# Patient Record
Sex: Female | Born: 1940 | Race: White | Smoking: Never smoker
Health system: Southern US, Community
[De-identification: ages and names within clinical notes are randomized; demographics above are authoritative.]

## PROBLEM LIST (undated history)

## (undated) DIAGNOSIS — J189 Pneumonia, unspecified organism: Secondary | ICD-10-CM

## (undated) DIAGNOSIS — F329 Major depressive disorder, single episode, unspecified: Secondary | ICD-10-CM

## (undated) DIAGNOSIS — F32A Depression, unspecified: Secondary | ICD-10-CM

## (undated) DIAGNOSIS — K5909 Other constipation: Secondary | ICD-10-CM

## (undated) DIAGNOSIS — G35 Multiple sclerosis: Secondary | ICD-10-CM

## (undated) DIAGNOSIS — K5790 Diverticulosis of intestine, part unspecified, without perforation or abscess without bleeding: Secondary | ICD-10-CM

## (undated) DIAGNOSIS — N390 Urinary tract infection, site not specified: Secondary | ICD-10-CM

## (undated) DIAGNOSIS — K219 Gastro-esophageal reflux disease without esophagitis: Secondary | ICD-10-CM

## (undated) HISTORY — PX: CHOLECYSTECTOMY: SHX55

---

## 1999-05-29 ENCOUNTER — Ambulatory Visit (HOSPITAL_COMMUNITY): Admission: RE | Admit: 1999-05-29 | Discharge: 1999-05-29 | Payer: Self-pay | Admitting: Endocrinology

## 1999-06-23 ENCOUNTER — Encounter: Payer: Self-pay | Admitting: Internal Medicine

## 1999-06-23 ENCOUNTER — Ambulatory Visit (HOSPITAL_COMMUNITY): Admission: RE | Admit: 1999-06-23 | Discharge: 1999-06-23 | Payer: Self-pay | Admitting: Internal Medicine

## 2001-02-05 ENCOUNTER — Ambulatory Visit (HOSPITAL_COMMUNITY): Admission: RE | Admit: 2001-02-05 | Discharge: 2001-02-05 | Payer: Self-pay | Admitting: Internal Medicine

## 2001-02-05 ENCOUNTER — Encounter: Payer: Self-pay | Admitting: Internal Medicine

## 2002-03-16 ENCOUNTER — Encounter: Payer: Self-pay | Admitting: Internal Medicine

## 2002-03-26 ENCOUNTER — Encounter: Payer: Self-pay | Admitting: Internal Medicine

## 2002-03-26 ENCOUNTER — Ambulatory Visit (HOSPITAL_COMMUNITY): Admission: RE | Admit: 2002-03-26 | Discharge: 2002-03-26 | Payer: Self-pay | Admitting: Internal Medicine

## 2002-05-19 ENCOUNTER — Ambulatory Visit (HOSPITAL_COMMUNITY): Admission: RE | Admit: 2002-05-19 | Discharge: 2002-05-19 | Payer: Self-pay | Admitting: Internal Medicine

## 2002-05-19 ENCOUNTER — Encounter: Payer: Self-pay | Admitting: Internal Medicine

## 2003-04-29 ENCOUNTER — Inpatient Hospital Stay (HOSPITAL_COMMUNITY): Admission: EM | Admit: 2003-04-29 | Discharge: 2003-05-03 | Payer: Self-pay | Admitting: Emergency Medicine

## 2003-04-29 ENCOUNTER — Encounter: Payer: Self-pay | Admitting: Internal Medicine

## 2003-04-30 ENCOUNTER — Encounter: Payer: Self-pay | Admitting: Internal Medicine

## 2003-05-03 ENCOUNTER — Encounter (INDEPENDENT_AMBULATORY_CARE_PROVIDER_SITE_OTHER): Payer: Self-pay | Admitting: *Deleted

## 2003-06-16 ENCOUNTER — Ambulatory Visit (HOSPITAL_COMMUNITY): Admission: RE | Admit: 2003-06-16 | Discharge: 2003-06-16 | Payer: Self-pay | Admitting: Internal Medicine

## 2003-06-16 ENCOUNTER — Encounter: Payer: Self-pay | Admitting: Internal Medicine

## 2003-08-16 ENCOUNTER — Ambulatory Visit (HOSPITAL_COMMUNITY): Admission: RE | Admit: 2003-08-16 | Discharge: 2003-08-16 | Payer: Self-pay | Admitting: Internal Medicine

## 2003-08-16 ENCOUNTER — Encounter: Payer: Self-pay | Admitting: Internal Medicine

## 2004-01-03 ENCOUNTER — Emergency Department (HOSPITAL_COMMUNITY): Admission: EM | Admit: 2004-01-03 | Discharge: 2004-01-03 | Payer: Self-pay | Admitting: Emergency Medicine

## 2004-08-24 ENCOUNTER — Ambulatory Visit: Payer: Self-pay | Admitting: Internal Medicine

## 2004-08-29 ENCOUNTER — Ambulatory Visit: Payer: Self-pay | Admitting: Internal Medicine

## 2004-08-29 ENCOUNTER — Inpatient Hospital Stay (HOSPITAL_COMMUNITY): Admission: EM | Admit: 2004-08-29 | Discharge: 2004-09-01 | Payer: Self-pay | Admitting: Emergency Medicine

## 2004-09-01 ENCOUNTER — Ambulatory Visit: Payer: Self-pay | Admitting: *Deleted

## 2004-09-08 ENCOUNTER — Ambulatory Visit: Payer: Self-pay | Admitting: Internal Medicine

## 2004-09-19 ENCOUNTER — Ambulatory Visit: Payer: Self-pay | Admitting: Internal Medicine

## 2004-10-30 ENCOUNTER — Ambulatory Visit: Payer: Self-pay | Admitting: Internal Medicine

## 2005-01-04 ENCOUNTER — Ambulatory Visit: Payer: Self-pay | Admitting: Internal Medicine

## 2005-01-15 ENCOUNTER — Ambulatory Visit (HOSPITAL_COMMUNITY): Admission: RE | Admit: 2005-01-15 | Discharge: 2005-01-15 | Payer: Self-pay | Admitting: *Deleted

## 2005-03-09 ENCOUNTER — Ambulatory Visit: Payer: Self-pay | Admitting: Family Medicine

## 2005-04-13 ENCOUNTER — Emergency Department (HOSPITAL_COMMUNITY): Admission: EM | Admit: 2005-04-13 | Discharge: 2005-04-13 | Payer: Self-pay | Admitting: Emergency Medicine

## 2005-04-23 ENCOUNTER — Ambulatory Visit: Payer: Self-pay | Admitting: Internal Medicine

## 2005-07-17 ENCOUNTER — Ambulatory Visit: Payer: Self-pay | Admitting: Internal Medicine

## 2005-08-30 ENCOUNTER — Ambulatory Visit: Payer: Self-pay | Admitting: Internal Medicine

## 2005-09-10 ENCOUNTER — Ambulatory Visit: Payer: Self-pay | Admitting: Internal Medicine

## 2005-09-17 ENCOUNTER — Ambulatory Visit: Payer: Self-pay | Admitting: Internal Medicine

## 2005-10-09 ENCOUNTER — Ambulatory Visit: Payer: Self-pay | Admitting: Internal Medicine

## 2005-10-22 ENCOUNTER — Ambulatory Visit: Payer: Self-pay | Admitting: Internal Medicine

## 2005-11-27 ENCOUNTER — Ambulatory Visit: Payer: Self-pay | Admitting: Internal Medicine

## 2005-11-27 ENCOUNTER — Ambulatory Visit (HOSPITAL_COMMUNITY): Admission: RE | Admit: 2005-11-27 | Discharge: 2005-11-27 | Payer: Self-pay | Admitting: Internal Medicine

## 2006-02-11 ENCOUNTER — Ambulatory Visit: Payer: Self-pay | Admitting: Internal Medicine

## 2006-02-24 ENCOUNTER — Ambulatory Visit: Payer: Self-pay | Admitting: Gastroenterology

## 2006-02-24 ENCOUNTER — Ambulatory Visit: Payer: Self-pay | Admitting: Physical Medicine & Rehabilitation

## 2006-02-24 ENCOUNTER — Ambulatory Visit: Payer: Self-pay | Admitting: Infectious Diseases

## 2006-02-24 ENCOUNTER — Inpatient Hospital Stay (HOSPITAL_COMMUNITY): Admission: EM | Admit: 2006-02-24 | Discharge: 2006-03-01 | Payer: Self-pay | Admitting: Emergency Medicine

## 2006-03-14 ENCOUNTER — Ambulatory Visit: Payer: Self-pay | Admitting: Internal Medicine

## 2006-04-25 ENCOUNTER — Ambulatory Visit: Payer: Self-pay | Admitting: Internal Medicine

## 2006-07-08 ENCOUNTER — Ambulatory Visit: Payer: Self-pay | Admitting: Internal Medicine

## 2006-07-25 ENCOUNTER — Ambulatory Visit: Payer: Self-pay | Admitting: Family Medicine

## 2006-07-25 ENCOUNTER — Inpatient Hospital Stay (HOSPITAL_COMMUNITY): Admission: EM | Admit: 2006-07-25 | Discharge: 2006-07-28 | Payer: Self-pay | Admitting: Emergency Medicine

## 2006-07-28 ENCOUNTER — Encounter (INDEPENDENT_AMBULATORY_CARE_PROVIDER_SITE_OTHER): Payer: Self-pay | Admitting: *Deleted

## 2006-09-11 ENCOUNTER — Inpatient Hospital Stay (HOSPITAL_COMMUNITY): Admission: EM | Admit: 2006-09-11 | Discharge: 2006-09-16 | Payer: Self-pay | Admitting: *Deleted

## 2006-09-15 ENCOUNTER — Encounter (INDEPENDENT_AMBULATORY_CARE_PROVIDER_SITE_OTHER): Payer: Self-pay | Admitting: *Deleted

## 2006-10-04 ENCOUNTER — Ambulatory Visit (HOSPITAL_COMMUNITY): Admission: RE | Admit: 2006-10-04 | Discharge: 2006-10-04 | Payer: Self-pay | Admitting: Internal Medicine

## 2006-10-04 ENCOUNTER — Ambulatory Visit: Payer: Self-pay | Admitting: Internal Medicine

## 2007-01-07 ENCOUNTER — Inpatient Hospital Stay (HOSPITAL_COMMUNITY): Admission: EM | Admit: 2007-01-07 | Discharge: 2007-01-10 | Payer: Self-pay | Admitting: Emergency Medicine

## 2007-02-27 ENCOUNTER — Ambulatory Visit: Payer: Self-pay | Admitting: Internal Medicine

## 2007-03-24 ENCOUNTER — Ambulatory Visit (HOSPITAL_COMMUNITY): Admission: RE | Admit: 2007-03-24 | Discharge: 2007-03-24 | Payer: Self-pay | Admitting: Internal Medicine

## 2007-03-26 ENCOUNTER — Ambulatory Visit: Payer: Self-pay | Admitting: Internal Medicine

## 2007-04-28 ENCOUNTER — Ambulatory Visit: Payer: Self-pay | Admitting: Family Medicine

## 2007-05-07 ENCOUNTER — Ambulatory Visit (HOSPITAL_COMMUNITY): Admission: RE | Admit: 2007-05-07 | Discharge: 2007-05-07 | Payer: Self-pay | Admitting: Internal Medicine

## 2007-05-29 ENCOUNTER — Ambulatory Visit: Payer: Self-pay | Admitting: Internal Medicine

## 2007-06-05 ENCOUNTER — Ambulatory Visit: Payer: Self-pay | Admitting: Internal Medicine

## 2007-07-24 ENCOUNTER — Inpatient Hospital Stay (HOSPITAL_COMMUNITY): Admission: EM | Admit: 2007-07-24 | Discharge: 2007-07-28 | Payer: Self-pay | Admitting: Emergency Medicine

## 2007-07-28 ENCOUNTER — Encounter (INDEPENDENT_AMBULATORY_CARE_PROVIDER_SITE_OTHER): Payer: Self-pay | Admitting: *Deleted

## 2007-08-01 ENCOUNTER — Ambulatory Visit: Payer: Self-pay | Admitting: Internal Medicine

## 2007-09-10 DIAGNOSIS — B0229 Other postherpetic nervous system involvement: Secondary | ICD-10-CM

## 2007-09-10 DIAGNOSIS — K219 Gastro-esophageal reflux disease without esophagitis: Secondary | ICD-10-CM | POA: Insufficient documentation

## 2007-09-10 DIAGNOSIS — R300 Dysuria: Secondary | ICD-10-CM | POA: Insufficient documentation

## 2007-09-10 DIAGNOSIS — R609 Edema, unspecified: Secondary | ICD-10-CM | POA: Insufficient documentation

## 2007-09-10 DIAGNOSIS — Z9089 Acquired absence of other organs: Secondary | ICD-10-CM

## 2007-09-10 DIAGNOSIS — J40 Bronchitis, not specified as acute or chronic: Secondary | ICD-10-CM | POA: Insufficient documentation

## 2007-09-10 DIAGNOSIS — F329 Major depressive disorder, single episode, unspecified: Secondary | ICD-10-CM | POA: Insufficient documentation

## 2007-09-10 DIAGNOSIS — G589 Mononeuropathy, unspecified: Secondary | ICD-10-CM | POA: Insufficient documentation

## 2007-10-22 ENCOUNTER — Ambulatory Visit: Payer: Self-pay | Admitting: Internal Medicine

## 2008-01-02 ENCOUNTER — Telehealth (INDEPENDENT_AMBULATORY_CARE_PROVIDER_SITE_OTHER): Payer: Self-pay | Admitting: Internal Medicine

## 2008-01-23 ENCOUNTER — Inpatient Hospital Stay (HOSPITAL_COMMUNITY): Admission: EM | Admit: 2008-01-23 | Discharge: 2008-01-29 | Payer: Self-pay | Admitting: Emergency Medicine

## 2008-01-29 ENCOUNTER — Encounter (INDEPENDENT_AMBULATORY_CARE_PROVIDER_SITE_OTHER): Payer: Self-pay | Admitting: *Deleted

## 2008-02-04 ENCOUNTER — Telehealth (INDEPENDENT_AMBULATORY_CARE_PROVIDER_SITE_OTHER): Payer: Self-pay | Admitting: Internal Medicine

## 2008-09-15 ENCOUNTER — Ambulatory Visit: Payer: Self-pay | Admitting: Family Medicine

## 2008-09-15 DIAGNOSIS — J069 Acute upper respiratory infection, unspecified: Secondary | ICD-10-CM | POA: Insufficient documentation

## 2008-09-15 LAB — CONVERTED CEMR LAB
Ketones, urine, test strip: NEGATIVE
Nitrite: POSITIVE
Protein, U semiquant: NEGATIVE
Urobilinogen, UA: 0.2
pH: 6

## 2008-09-29 ENCOUNTER — Telehealth (INDEPENDENT_AMBULATORY_CARE_PROVIDER_SITE_OTHER): Payer: Self-pay | Admitting: Internal Medicine

## 2008-09-29 ENCOUNTER — Ambulatory Visit: Payer: Self-pay | Admitting: Internal Medicine

## 2009-01-27 ENCOUNTER — Ambulatory Visit: Payer: Self-pay | Admitting: Nurse Practitioner

## 2009-01-27 DIAGNOSIS — N3090 Cystitis, unspecified without hematuria: Secondary | ICD-10-CM | POA: Insufficient documentation

## 2009-01-27 DIAGNOSIS — R21 Rash and other nonspecific skin eruption: Secondary | ICD-10-CM

## 2009-01-27 LAB — CONVERTED CEMR LAB
Nitrite: POSITIVE
Protein, U semiquant: 100
Urobilinogen, UA: 1

## 2009-01-28 ENCOUNTER — Encounter (INDEPENDENT_AMBULATORY_CARE_PROVIDER_SITE_OTHER): Payer: Self-pay | Admitting: Nurse Practitioner

## 2009-05-24 ENCOUNTER — Telehealth (INDEPENDENT_AMBULATORY_CARE_PROVIDER_SITE_OTHER): Payer: Self-pay | Admitting: Family Medicine

## 2009-05-25 ENCOUNTER — Ambulatory Visit (HOSPITAL_COMMUNITY): Admission: RE | Admit: 2009-05-25 | Discharge: 2009-05-25 | Payer: Self-pay | Admitting: Family Medicine

## 2009-05-25 ENCOUNTER — Telehealth (INDEPENDENT_AMBULATORY_CARE_PROVIDER_SITE_OTHER): Payer: Self-pay | Admitting: Internal Medicine

## 2009-05-25 ENCOUNTER — Ambulatory Visit: Payer: Self-pay | Admitting: Family Medicine

## 2009-05-25 ENCOUNTER — Ambulatory Visit: Payer: Self-pay | Admitting: Vascular Surgery

## 2009-05-25 ENCOUNTER — Encounter (INDEPENDENT_AMBULATORY_CARE_PROVIDER_SITE_OTHER): Payer: Self-pay | Admitting: Internal Medicine

## 2009-05-25 DIAGNOSIS — M79609 Pain in unspecified limb: Secondary | ICD-10-CM

## 2009-05-26 ENCOUNTER — Ambulatory Visit: Payer: Self-pay | Admitting: Nurse Practitioner

## 2009-05-27 ENCOUNTER — Telehealth: Payer: Self-pay | Admitting: Internal Medicine

## 2009-06-02 ENCOUNTER — Ambulatory Visit: Payer: Self-pay | Admitting: Nurse Practitioner

## 2009-06-02 DIAGNOSIS — E669 Obesity, unspecified: Secondary | ICD-10-CM | POA: Insufficient documentation

## 2009-06-03 ENCOUNTER — Encounter (INDEPENDENT_AMBULATORY_CARE_PROVIDER_SITE_OTHER): Payer: Self-pay | Admitting: Internal Medicine

## 2009-06-03 ENCOUNTER — Encounter (INDEPENDENT_AMBULATORY_CARE_PROVIDER_SITE_OTHER): Payer: Self-pay | Admitting: Nurse Practitioner

## 2009-06-03 LAB — CONVERTED CEMR LAB
AST: 22 units/L (ref 0–37)
Albumin: 4.1 g/dL (ref 3.5–5.2)
Alkaline Phosphatase: 94 units/L (ref 39–117)
BUN: 15 mg/dL (ref 6–23)
Basophils Absolute: 0 10*3/uL (ref 0.0–0.1)
Calcium: 9.7 mg/dL (ref 8.4–10.5)
Creatinine, Ser: 0.89 mg/dL (ref 0.40–1.20)
Eosinophils Absolute: 0.1 10*3/uL (ref 0.0–0.7)
Eosinophils Relative: 2 % (ref 0–5)
Glucose, Bld: 70 mg/dL (ref 70–99)
HCT: 39.7 % (ref 36.0–46.0)
HDL: 49 mg/dL (ref >39)
Hemoglobin: 11.9 g/dL — ABNORMAL LOW (ref 12.0–15.0)
LDL Cholesterol: 77 mg/dL (ref 0–99)
MCHC: 30 g/dL (ref 30.0–36.0)
MCV: 84.3 fL (ref 78.0–100.0)
Monocytes Absolute: 0.4 10*3/uL (ref 0.1–1.0)
Platelets: 333 10*3/uL (ref 150–400)
Potassium: 4.1 meq/L (ref 3.5–5.3)
RDW: 16.5 % — ABNORMAL HIGH (ref 11.5–15.5)
Total CHOL/HDL Ratio: 3.3
Triglycerides: 168 mg/dL — ABNORMAL HIGH (ref <150)

## 2009-07-11 ENCOUNTER — Emergency Department (HOSPITAL_COMMUNITY): Admission: EM | Admit: 2009-07-11 | Discharge: 2009-07-11 | Payer: Self-pay | Admitting: Family Medicine

## 2009-07-11 ENCOUNTER — Telehealth (INDEPENDENT_AMBULATORY_CARE_PROVIDER_SITE_OTHER): Payer: Self-pay | Admitting: Internal Medicine

## 2009-07-15 ENCOUNTER — Telehealth (INDEPENDENT_AMBULATORY_CARE_PROVIDER_SITE_OTHER): Payer: Self-pay | Admitting: Internal Medicine

## 2009-07-20 ENCOUNTER — Emergency Department (HOSPITAL_COMMUNITY): Admission: EM | Admit: 2009-07-20 | Discharge: 2009-07-20 | Payer: Self-pay | Admitting: Emergency Medicine

## 2009-07-21 ENCOUNTER — Ambulatory Visit: Payer: Self-pay | Admitting: *Deleted

## 2009-07-21 ENCOUNTER — Ambulatory Visit (HOSPITAL_COMMUNITY): Admission: RE | Admit: 2009-07-21 | Discharge: 2009-07-21 | Payer: Self-pay | Admitting: Emergency Medicine

## 2009-07-21 ENCOUNTER — Encounter (INDEPENDENT_AMBULATORY_CARE_PROVIDER_SITE_OTHER): Payer: Self-pay | Admitting: Emergency Medicine

## 2009-07-21 ENCOUNTER — Emergency Department (HOSPITAL_COMMUNITY): Admission: EM | Admit: 2009-07-21 | Discharge: 2009-07-21 | Payer: Self-pay | Admitting: Family Medicine

## 2009-08-11 ENCOUNTER — Ambulatory Visit: Payer: Self-pay | Admitting: Nurse Practitioner

## 2009-08-11 DIAGNOSIS — I739 Peripheral vascular disease, unspecified: Secondary | ICD-10-CM | POA: Insufficient documentation

## 2009-08-11 DIAGNOSIS — B379 Candidiasis, unspecified: Secondary | ICD-10-CM | POA: Insufficient documentation

## 2009-08-15 ENCOUNTER — Encounter (INDEPENDENT_AMBULATORY_CARE_PROVIDER_SITE_OTHER): Payer: Self-pay | Admitting: Nurse Practitioner

## 2009-09-07 ENCOUNTER — Encounter (INDEPENDENT_AMBULATORY_CARE_PROVIDER_SITE_OTHER): Payer: Self-pay | Admitting: Nurse Practitioner

## 2009-09-08 ENCOUNTER — Ambulatory Visit: Admission: RE | Admit: 2009-09-08 | Discharge: 2009-09-08 | Payer: Self-pay | Admitting: Internal Medicine

## 2009-09-08 ENCOUNTER — Encounter (INDEPENDENT_AMBULATORY_CARE_PROVIDER_SITE_OTHER): Payer: Self-pay | Admitting: Internal Medicine

## 2009-09-08 ENCOUNTER — Ambulatory Visit: Payer: Self-pay | Admitting: Surgery

## 2009-09-13 ENCOUNTER — Telehealth (INDEPENDENT_AMBULATORY_CARE_PROVIDER_SITE_OTHER): Payer: Self-pay | Admitting: Nurse Practitioner

## 2009-09-15 ENCOUNTER — Ambulatory Visit: Payer: Self-pay | Admitting: Nurse Practitioner

## 2009-09-15 DIAGNOSIS — L97309 Non-pressure chronic ulcer of unspecified ankle with unspecified severity: Secondary | ICD-10-CM

## 2009-09-15 DIAGNOSIS — L89209 Pressure ulcer of unspecified hip, unspecified stage: Secondary | ICD-10-CM | POA: Insufficient documentation

## 2009-09-19 ENCOUNTER — Ambulatory Visit: Payer: Self-pay | Admitting: Nurse Practitioner

## 2009-09-19 ENCOUNTER — Telehealth (INDEPENDENT_AMBULATORY_CARE_PROVIDER_SITE_OTHER): Payer: Self-pay | Admitting: Nurse Practitioner

## 2009-09-20 ENCOUNTER — Encounter (INDEPENDENT_AMBULATORY_CARE_PROVIDER_SITE_OTHER): Payer: Self-pay | Admitting: Internal Medicine

## 2009-09-21 ENCOUNTER — Telehealth (INDEPENDENT_AMBULATORY_CARE_PROVIDER_SITE_OTHER): Payer: Self-pay | Admitting: Nurse Practitioner

## 2009-09-27 ENCOUNTER — Encounter (INDEPENDENT_AMBULATORY_CARE_PROVIDER_SITE_OTHER): Payer: Self-pay | Admitting: Internal Medicine

## 2009-09-29 ENCOUNTER — Telehealth (INDEPENDENT_AMBULATORY_CARE_PROVIDER_SITE_OTHER): Payer: Self-pay | Admitting: Nurse Practitioner

## 2009-09-30 ENCOUNTER — Encounter (INDEPENDENT_AMBULATORY_CARE_PROVIDER_SITE_OTHER): Payer: Self-pay | Admitting: Internal Medicine

## 2009-10-04 ENCOUNTER — Encounter (INDEPENDENT_AMBULATORY_CARE_PROVIDER_SITE_OTHER): Payer: Self-pay | Admitting: Nurse Practitioner

## 2009-10-05 ENCOUNTER — Ambulatory Visit: Payer: Self-pay | Admitting: Vascular Surgery

## 2009-10-11 ENCOUNTER — Encounter (INDEPENDENT_AMBULATORY_CARE_PROVIDER_SITE_OTHER): Payer: Self-pay | Admitting: Nurse Practitioner

## 2009-10-17 ENCOUNTER — Telehealth (INDEPENDENT_AMBULATORY_CARE_PROVIDER_SITE_OTHER): Payer: Self-pay | Admitting: Nurse Practitioner

## 2009-10-20 ENCOUNTER — Inpatient Hospital Stay (HOSPITAL_COMMUNITY): Admission: EM | Admit: 2009-10-20 | Discharge: 2009-10-22 | Payer: Self-pay | Admitting: Emergency Medicine

## 2009-10-21 ENCOUNTER — Ambulatory Visit: Payer: Self-pay | Admitting: Surgery

## 2009-10-21 ENCOUNTER — Encounter (INDEPENDENT_AMBULATORY_CARE_PROVIDER_SITE_OTHER): Payer: Self-pay | Admitting: Internal Medicine

## 2009-10-22 ENCOUNTER — Encounter (INDEPENDENT_AMBULATORY_CARE_PROVIDER_SITE_OTHER): Payer: Self-pay | Admitting: Internal Medicine

## 2009-11-04 ENCOUNTER — Encounter (INDEPENDENT_AMBULATORY_CARE_PROVIDER_SITE_OTHER): Payer: Self-pay | Admitting: Internal Medicine

## 2009-11-04 ENCOUNTER — Ambulatory Visit: Payer: Self-pay | Admitting: Nurse Practitioner

## 2009-11-04 DIAGNOSIS — Z780 Asymptomatic menopausal state: Secondary | ICD-10-CM | POA: Insufficient documentation

## 2009-11-04 LAB — CONVERTED CEMR LAB
Glucose, Urine, Semiquant: NEGATIVE
Nitrite: NEGATIVE

## 2009-11-05 ENCOUNTER — Encounter (INDEPENDENT_AMBULATORY_CARE_PROVIDER_SITE_OTHER): Payer: Self-pay | Admitting: Nurse Practitioner

## 2009-11-23 ENCOUNTER — Encounter (INDEPENDENT_AMBULATORY_CARE_PROVIDER_SITE_OTHER): Payer: Self-pay | Admitting: Nurse Practitioner

## 2009-11-23 ENCOUNTER — Ambulatory Visit (HOSPITAL_COMMUNITY): Admission: RE | Admit: 2009-11-23 | Discharge: 2009-11-23 | Payer: Self-pay | Admitting: Internal Medicine

## 2009-11-23 DIAGNOSIS — M899 Disorder of bone, unspecified: Secondary | ICD-10-CM | POA: Insufficient documentation

## 2009-11-23 DIAGNOSIS — M949 Disorder of cartilage, unspecified: Secondary | ICD-10-CM

## 2010-01-27 ENCOUNTER — Telehealth (INDEPENDENT_AMBULATORY_CARE_PROVIDER_SITE_OTHER): Payer: Self-pay | Admitting: Nurse Practitioner

## 2010-04-12 ENCOUNTER — Ambulatory Visit: Payer: Self-pay | Admitting: Nurse Practitioner

## 2010-04-12 LAB — CONVERTED CEMR LAB
Ketones, urine, test strip: NEGATIVE
Nitrite: POSITIVE
Urobilinogen, UA: 0.2

## 2010-04-13 ENCOUNTER — Encounter (INDEPENDENT_AMBULATORY_CARE_PROVIDER_SITE_OTHER): Payer: Self-pay | Admitting: Nurse Practitioner

## 2010-04-23 HISTORY — PX: CLOSED REDUCTION SHOULDER DISLOCATION: SUR242

## 2010-05-07 ENCOUNTER — Ambulatory Visit (HOSPITAL_COMMUNITY): Admission: EM | Admit: 2010-05-07 | Discharge: 2010-05-08 | Payer: Self-pay | Admitting: Emergency Medicine

## 2010-05-24 DIAGNOSIS — S42293A Other displaced fracture of upper end of unspecified humerus, initial encounter for closed fracture: Secondary | ICD-10-CM | POA: Insufficient documentation

## 2010-06-16 ENCOUNTER — Telehealth (INDEPENDENT_AMBULATORY_CARE_PROVIDER_SITE_OTHER): Payer: Self-pay | Admitting: Nurse Practitioner

## 2010-06-22 ENCOUNTER — Telehealth (INDEPENDENT_AMBULATORY_CARE_PROVIDER_SITE_OTHER): Payer: Self-pay | Admitting: Nurse Practitioner

## 2010-06-22 ENCOUNTER — Emergency Department (HOSPITAL_COMMUNITY): Admission: EM | Admit: 2010-06-22 | Discharge: 2010-06-22 | Payer: Self-pay | Admitting: Family Medicine

## 2010-06-23 HISTORY — PX: VIDEO ASSISTED THORACOSCOPY: SHX5073

## 2010-06-29 ENCOUNTER — Encounter (INDEPENDENT_AMBULATORY_CARE_PROVIDER_SITE_OTHER): Payer: Self-pay | Admitting: Nurse Practitioner

## 2010-07-11 ENCOUNTER — Encounter (INDEPENDENT_AMBULATORY_CARE_PROVIDER_SITE_OTHER): Payer: Self-pay | Admitting: Nurse Practitioner

## 2010-07-12 ENCOUNTER — Inpatient Hospital Stay (HOSPITAL_COMMUNITY): Admission: EM | Admit: 2010-07-12 | Discharge: 2010-08-04 | Payer: Self-pay | Admitting: Emergency Medicine

## 2010-07-12 ENCOUNTER — Ambulatory Visit: Payer: Self-pay | Admitting: Internal Medicine

## 2010-07-12 DIAGNOSIS — J189 Pneumonia, unspecified organism: Secondary | ICD-10-CM

## 2010-07-18 ENCOUNTER — Ambulatory Visit: Payer: Self-pay | Admitting: Cardiothoracic Surgery

## 2010-07-19 ENCOUNTER — Encounter: Payer: Self-pay | Admitting: Internal Medicine

## 2010-07-23 ENCOUNTER — Encounter: Payer: Self-pay | Admitting: Internal Medicine

## 2010-07-26 ENCOUNTER — Ambulatory Visit: Payer: Self-pay | Admitting: Internal Medicine

## 2010-08-01 ENCOUNTER — Encounter (INDEPENDENT_AMBULATORY_CARE_PROVIDER_SITE_OTHER): Payer: Self-pay | Admitting: *Deleted

## 2010-08-01 LAB — CONVERTED CEMR LAB
ALT: 13 units/L
Alkaline Phosphatase: 143 units/L
CO2: 21 meq/L
Creatinine, Ser: 1.9 mg/dL
Sodium: 142 meq/L
Total Bilirubin: 0.3 mg/dL

## 2010-08-02 LAB — CONVERTED CEMR LAB
Basophils Absolute: 0.1 10*3/uL
Basophils Relative: 0 %
Eosinophils Absolute: 0.5 10*3/uL
Eosinophils Relative: 9 %
HCT: 29.7 %
Hemoglobin: 9.1 g/dL
MCHC: 30.6 g/dL
MCV: 78.4 fL
Monocytes Absolute: 1 10*3/uL
Platelets: 486 10*3/uL
RDW: 18.4 %

## 2010-08-04 ENCOUNTER — Encounter: Payer: Self-pay | Admitting: Internal Medicine

## 2010-08-11 ENCOUNTER — Encounter: Payer: Self-pay | Admitting: Internal Medicine

## 2010-08-15 ENCOUNTER — Ambulatory Visit: Payer: Self-pay | Admitting: Nurse Practitioner

## 2010-08-15 DIAGNOSIS — N179 Acute kidney failure, unspecified: Secondary | ICD-10-CM

## 2010-08-17 ENCOUNTER — Ambulatory Visit: Payer: Self-pay | Admitting: Cardiothoracic Surgery

## 2010-08-17 ENCOUNTER — Encounter: Admission: RE | Admit: 2010-08-17 | Discharge: 2010-08-17 | Payer: Self-pay | Admitting: Cardiothoracic Surgery

## 2010-08-18 ENCOUNTER — Encounter (INDEPENDENT_AMBULATORY_CARE_PROVIDER_SITE_OTHER): Payer: Self-pay | Admitting: Nurse Practitioner

## 2010-08-21 DIAGNOSIS — Z8639 Personal history of other endocrine, nutritional and metabolic disease: Secondary | ICD-10-CM

## 2010-08-21 DIAGNOSIS — Z862 Personal history of diseases of the blood and blood-forming organs and certain disorders involving the immune mechanism: Secondary | ICD-10-CM

## 2010-08-21 LAB — CONVERTED CEMR LAB
Alkaline Phosphatase: 230 units/L — ABNORMAL HIGH (ref 39–117)
BUN: 19 mg/dL (ref 6–23)
Basophils Relative: 1 % (ref 0–1)
Eosinophils Absolute: 0.2 10*3/uL (ref 0.0–0.7)
Eosinophils Relative: 2 % (ref 0–5)
Glucose, Bld: 109 mg/dL — ABNORMAL HIGH (ref 70–99)
HCT: 40.9 % (ref 36.0–46.0)
Lymphs Abs: 3.4 10*3/uL (ref 0.7–4.0)
MCHC: 30.3 g/dL (ref 30.0–36.0)
MCV: 80.8 fL (ref 78.0–100.0)
Monocytes Relative: 9 % (ref 3–12)
Platelets: 443 10*3/uL — ABNORMAL HIGH (ref 150–400)
Sodium: 137 meq/L (ref 135–145)
Total Bilirubin: 0.5 mg/dL (ref 0.3–1.2)
WBC: 9.2 10*3/uL (ref 4.0–10.5)

## 2010-08-31 ENCOUNTER — Ambulatory Visit (HOSPITAL_COMMUNITY): Admission: RE | Admit: 2010-08-31 | Discharge: 2010-08-31 | Payer: Self-pay | Admitting: Internal Medicine

## 2010-09-04 ENCOUNTER — Telehealth (INDEPENDENT_AMBULATORY_CARE_PROVIDER_SITE_OTHER): Payer: Self-pay | Admitting: Nurse Practitioner

## 2010-09-07 ENCOUNTER — Ambulatory Visit (HOSPITAL_COMMUNITY): Admission: RE | Admit: 2010-09-07 | Discharge: 2010-09-07 | Payer: Self-pay | Admitting: Internal Medicine

## 2010-09-07 ENCOUNTER — Encounter: Payer: Self-pay | Admitting: Internal Medicine

## 2010-11-01 ENCOUNTER — Telehealth (INDEPENDENT_AMBULATORY_CARE_PROVIDER_SITE_OTHER): Payer: Self-pay | Admitting: Nurse Practitioner

## 2010-11-03 ENCOUNTER — Telehealth (INDEPENDENT_AMBULATORY_CARE_PROVIDER_SITE_OTHER): Payer: Self-pay | Admitting: Nurse Practitioner

## 2010-11-06 ENCOUNTER — Telehealth (INDEPENDENT_AMBULATORY_CARE_PROVIDER_SITE_OTHER): Payer: Self-pay | Admitting: Nurse Practitioner

## 2010-11-09 ENCOUNTER — Encounter (INDEPENDENT_AMBULATORY_CARE_PROVIDER_SITE_OTHER): Payer: Self-pay | Admitting: Nurse Practitioner

## 2010-11-14 ENCOUNTER — Encounter (INDEPENDENT_AMBULATORY_CARE_PROVIDER_SITE_OTHER): Payer: Self-pay | Admitting: Nurse Practitioner

## 2010-11-20 ENCOUNTER — Ambulatory Visit: Payer: Self-pay | Admitting: Nurse Practitioner

## 2010-11-20 DIAGNOSIS — R5383 Other fatigue: Secondary | ICD-10-CM

## 2010-11-20 DIAGNOSIS — R5381 Other malaise: Secondary | ICD-10-CM | POA: Insufficient documentation

## 2010-11-20 DIAGNOSIS — R6889 Other general symptoms and signs: Secondary | ICD-10-CM

## 2010-11-20 DIAGNOSIS — R634 Abnormal weight loss: Secondary | ICD-10-CM

## 2010-11-20 LAB — CONVERTED CEMR LAB: Vitamin B-12: 319 pg/mL (ref 211–911)

## 2010-11-21 ENCOUNTER — Telehealth (INDEPENDENT_AMBULATORY_CARE_PROVIDER_SITE_OTHER): Payer: Self-pay | Admitting: Nurse Practitioner

## 2010-11-28 ENCOUNTER — Encounter (INDEPENDENT_AMBULATORY_CARE_PROVIDER_SITE_OTHER): Payer: Self-pay | Admitting: Nurse Practitioner

## 2010-12-11 ENCOUNTER — Encounter (INDEPENDENT_AMBULATORY_CARE_PROVIDER_SITE_OTHER): Payer: Self-pay | Admitting: Nurse Practitioner

## 2010-12-11 ENCOUNTER — Encounter: Payer: Self-pay | Admitting: Internal Medicine

## 2010-12-20 ENCOUNTER — Encounter
Admission: RE | Admit: 2010-12-20 | Discharge: 2010-12-20 | Payer: Self-pay | Source: Home / Self Care | Attending: Neurology | Admitting: Neurology

## 2010-12-20 ENCOUNTER — Encounter (INDEPENDENT_AMBULATORY_CARE_PROVIDER_SITE_OTHER): Payer: Self-pay | Admitting: Nurse Practitioner

## 2010-12-22 ENCOUNTER — Encounter (INDEPENDENT_AMBULATORY_CARE_PROVIDER_SITE_OTHER): Payer: Self-pay | Admitting: Nurse Practitioner

## 2010-12-22 DIAGNOSIS — G35 Multiple sclerosis: Secondary | ICD-10-CM

## 2010-12-28 ENCOUNTER — Encounter
Admission: RE | Admit: 2010-12-28 | Discharge: 2010-12-28 | Payer: Self-pay | Source: Home / Self Care | Attending: Family Medicine | Admitting: Family Medicine

## 2011-01-23 ENCOUNTER — Inpatient Hospital Stay (HOSPITAL_COMMUNITY)
Admission: EM | Admit: 2011-01-23 | Discharge: 2011-01-26 | DRG: 493 | Disposition: A | Discharge status: Home Health | Payer: Medicare (Managed Care) | Attending: Family Medicine | Admitting: Family Medicine

## 2011-01-23 DIAGNOSIS — Y999 Unspecified external cause status: Secondary | ICD-10-CM

## 2011-01-23 DIAGNOSIS — N390 Urinary tract infection, site not specified: Secondary | ICD-10-CM | POA: Diagnosis present

## 2011-01-23 DIAGNOSIS — G35 Multiple sclerosis: Secondary | ICD-10-CM | POA: Diagnosis present

## 2011-01-23 DIAGNOSIS — K219 Gastro-esophageal reflux disease without esophagitis: Secondary | ICD-10-CM | POA: Diagnosis present

## 2011-01-23 DIAGNOSIS — E669 Obesity, unspecified: Secondary | ICD-10-CM | POA: Diagnosis present

## 2011-01-23 DIAGNOSIS — Y92009 Unspecified place in unspecified non-institutional (private) residence as the place of occurrence of the external cause: Secondary | ICD-10-CM

## 2011-01-23 DIAGNOSIS — R32 Unspecified urinary incontinence: Secondary | ICD-10-CM | POA: Diagnosis present

## 2011-01-23 DIAGNOSIS — S42409A Unspecified fracture of lower end of unspecified humerus, initial encounter for closed fracture: Principal | ICD-10-CM | POA: Diagnosis present

## 2011-01-23 DIAGNOSIS — W010XXA Fall on same level from slipping, tripping and stumbling without subsequent striking against object, initial encounter: Secondary | ICD-10-CM | POA: Diagnosis present

## 2011-01-23 DIAGNOSIS — G5690 Unspecified mononeuropathy of unspecified upper limb: Secondary | ICD-10-CM | POA: Diagnosis present

## 2011-01-23 DIAGNOSIS — R05 Cough: Secondary | ICD-10-CM | POA: Diagnosis present

## 2011-01-23 DIAGNOSIS — R059 Cough, unspecified: Secondary | ICD-10-CM | POA: Diagnosis present

## 2011-01-23 DIAGNOSIS — S42309A Unspecified fracture of shaft of humerus, unspecified arm, initial encounter for closed fracture: Secondary | ICD-10-CM | POA: Diagnosis present

## 2011-01-23 DIAGNOSIS — K5900 Constipation, unspecified: Secondary | ICD-10-CM | POA: Diagnosis present

## 2011-01-23 DIAGNOSIS — Y9301 Activity, walking, marching and hiking: Secondary | ICD-10-CM

## 2011-01-23 NOTE — Discharge Summary (Signed)
Summary: Hospital Discharge Update    Hospital Discharge Update:  Date of Admission: 07/12/2010 Date of Discharge: 08/04/2010  Brief Summary:  Ms. Sexson was admitted for treatment of LLL pneumonia. She was discharged in improved condition to a skilled nursing facility. The following problems were addressed during her admission: 1. Left lower lobe hospital-acquired pneumonia/empyema. Treated with Vanc/Zosyn x 9 days and then imipenem x 6 days. Underwent VATS procedure on 7/27 with evacuation of empyema and placement of 2 apical L-sided chest tubes, which remained in place for 4-5 days. On discharge, she had been afebrile with stable X-ray findings for many days. She will f/u with Dr. Lowella Fairy on August 25th (CXR at 9:45am followed appointment at 10:15am).   2. Nonoliguric acute renal failure. Following VATS procedure, serum creatinine increased to 4.4. Renal consultants felt this was multifactorial renal injury 2/2 hypotension during surgery, IV contrast dye, and NSAIDs. She received IV fluid with added bicard. Urine output remained good. Creatinine trended down over the next couple of weeks and was approaching her baseline at discharge. Her serum creatinine should continue to be followed.  3. Decubitus ulcer. Stage III decubitus ulcer identified on admission on R buttock. Wound care saw her and advised dressing changes every 4th day. 4. Chronic constipation. Ongoing issue for her. She will follow-up with her gastroenterologist, Dr. Marina Goodell, on Sept 20th at 1:45pm. 5. Multiple sclerosis. Betaseron held during admission. To be re-started on discharge.  6. Depression. Continued home amitriptylene and added Celexa as the patient's mood was poor. She will follow up with [name], her PCP, on Aug 23rd at 9:30am, at which time she may want to consider increasing dose of Celexa.  7. Deconditioning. Ms. Kliewer was bed bound at home followng shoulder dislocation/humeral head fracture. She worked with PT/OT  throughout admission and mobility has improved only modestly. However, she is able to sit up and get into chair with assistance. She should continue PT/OT to improve mobility and functional status.  Labs needed at follow-up: CBC with differential, Basic metabolic panel  Medication list changes:  Removed medication of ETODOLAC 400 MG TABS (ETODOLAC) Take 1 tablet by mouth every 12 hours(ortho) Added new medication of ZOFRAN 4 MG TABS (ONDANSETRON HCL) Take one tablet every 8 hours as needed for nausea. Added new medication of METOPROLOL SUCCINATE 25 MG XR24H-TAB (METOPROLOL SUCCINATE) One tablet by mouth twice daily  The medication, problem, and allergy lists have been updated.  Please see the dictated discharge summary for details.  Discharge medications:  AMITRIPTYLINE HCL 50 MG  TABS (AMITRIPTYLINE HCL) Take 2 tabs Po at bedtime([facility]) NEURONTIN 300 MG  CAPS (GABAPENTIN) One capsule by mouth three times a day [facility] IRON 325 (65 FE) MG TABS (FERROUS SULFATE) 1 tablet by mouth daily to build up blood POLYSPORIN 500-10000 UNIT/GM OINT (BACITRACIN-POLYMYXIN B) 1 application topically to affected area two times a day until healed OMEPRAZOLE 20 MG CPDR (OMEPRAZOLE) one by mouth two times a day(Dr.Perry) OXYBUTYNIN CHLORIDE 5 MG TABS (OXYBUTYNIN CHLORIDE) Take 1 tablet by mouth every 12 hours([facility]). BETASERON 0.3 MG SOLR (INTERFERON BETA-1B) Inject every other day([facility] at [facility]) NYSTATIN-TRIAMCINOLONE 100000-0.1 UNIT/GM-% OINT (NYSTATIN-TRIAMCINOLONE) One application topically two times a day to affected area until healed MULTIVITAMINS  TABS (MULTIPLE VITAMIN) One tablet by mouth daily ACETAMINOPHEN 500 MG CAPS (ACETAMINOPHEN) 1-2 tablets by mouth every 6 hours as needed for pain CALCIUM-VITAMIN D 600-200 MG-UNIT TABS (CALCIUM-VITAMIN D) One tablet by mouth two times a day ALENDRONATE SODIUM 35 MG TABS (ALENDRONATE SODIUM) One  tablet by mouth WEEKLY for bones ZOFRAN 4 MG TABS  (ONDANSETRON HCL) Take one tablet every 8 hours as needed for nausea. METOPROLOL SUCCINATE 25 MG XR24H-TAB (METOPROLOL SUCCINATE) One tablet by mouth twice daily  Other patient instructions:  Please follow-up with [name] at [facility] on Tuesday August 23rd at 9:30 am.  Please follow-up with Dr. Tyrone Sage at [facility] on August 25th for a chest X-ray at 9:45am and an appointment at 10:15am.  Please follow-up with Dr. Marina Goodell at [facility] on Tuesday September 20th at 1:45pm.   If you experience fever, chest pain, or increased shortness of breath, please call your PCP's office or go to the Emergency Department.    Note: Hospital Discharge Medications & Other Instructions handout was printed, one copy for patient and a second copy to be placed in hospital chart.

## 2011-01-23 NOTE — Letter (Signed)
Summary: [facility] //SUPPLIES & EQUIPMENT  [facility] //SUPPLIES & EQUIPMENT   Imported By: Arta Bruce 11/14/2010 11:24:59  _____________________________________________________________________  External Attachment:    Type:   Image     Comment:   External Document

## 2011-01-23 NOTE — Letter (Signed)
Summary: [facility] REQUESTED RECORDS TO [facility]  [facility]ED REC[facility] TO [facility]   Imported By: Arta Bruce 07/11/2010 15:31:09  _____________________________________________________________________  External Attachment:    Type:   Image     Comment:   External Document

## 2011-01-23 NOTE — Progress Notes (Signed)
Summary: Medication  Phone Note Other Incoming   Summary of Call: DAUGHTER CALLED ASKING IF HER MOTHER'S BETASERON HAS CAME IN YET.SHE SAID IT IS DELIVERED HERE.PLEASE CALL HER BACK @ [phone] EXT [address] Initial call taken by: Arta Bruce,  November 06, 2010 9:20 AM  Follow-up for Phone Call        forward to N. Daphine Deutscher, fnp Follow-up by: Levon Hedger,  November 06, 2010 2:38 PM  Additional Follow-up for Phone Call Additional follow up Details #1::        This medication is prescribed by neurology so it may go there.  It will like likely go there but we will be on the lookout in case it goes there. Additional Follow-up by: Lehman Prom FNP,  November 06, 2010 6:24 PM    Additional Follow-up for Phone Call Additional follow up Details #2::    spoke with pt she said that they had a mix up and apologized for the confusion. Follow-up by: Levon Hedger,  November 07, 2010 2:32 PM

## 2011-01-23 NOTE — Progress Notes (Signed)
Summary: F/U urgent care visit  Phone Note Call from Patient Call back at [phone]   Summary of Call: Since last week, the pt has a bad pain in the rectum area and she wants to be seen as soon as possible but the earliest appointment I see with ther provider is until July 13. [facility] Initial call taken by: Manon Hilding,  June 22, 2010 9:31 AM  Follow-up for Phone Call        Left message on answering machine to return call.  Dutch Quint RN  June 22, 2010 11:31 AM  Micah Flesher to urgent care for treatment of bedsore on buttocks, stage II, and urgent care will refer her to wound care for further treatment.  May need referral later on for home care.  Dutch Quint RN  June 23, 2010 10:48 AM   Additional Follow-up for Phone Call Additional follow up Details #1::        need to see urgent care report Additional Follow-up by: Lehman Prom FNP,  June 23, 2010 12:17 PM    Additional Follow-up for Phone Call Additional follow up Details #2::    Urgent care report reviewed advise pt to keep appt as scheduled in this office on July 13 it appears that she is having some incontinence issues and this is what is either causing or delaying sacral ulcer. has she been to wound care as ordered? Follow-up by: Lehman Prom FNP,  June 27, 2010 2:18 PM  Additional Follow-up for Phone Call Additional follow up Details #3:: Details for Additional Follow-up Action Taken: No answer, unable to leave message.  Dutch Quint RN  June 28, 2010 10:38 AM No answer, unable to leave message.  Letter sent.  Dutch Quint RN  June 29, 2010 12:25 PM

## 2011-01-23 NOTE — Progress Notes (Signed)
Summary: request for wound care  Phone Note Other Incoming   Caller: [facility] Summary of Call: Pt. was seen by OT noted a decubitis on buttocks which she had while she was in the nursing home, not healing well. Requesting for nurse to assess and provide wound care. Call [facility] [phone]. Initial call taken by: Gaylyn Cheers RN,  November 01, 2010 5:24 PM  Follow-up for Phone Call        ok to give verbal order to [facility] I assessed pt previously for this problem so it is likely recurrent They can then fax over paper order to sign Follow-up by: Lehman Prom FNP,  November 01, 2010 6:00 PM  Additional Follow-up for Phone Call Additional follow up Details #1::        [facility] called V.O. given for skilled nurse  for wound care. They will fax form to sign. Additional Follow-up by: Gaylyn Cheers RN,  November 02, 2010 10:09 AM

## 2011-01-23 NOTE — Letter (Signed)
Summary: Generic Letter  [facility]  [address] [place], [place] [address]   Phone: [phone]  Fax: [phone]    06/29/2010  [name] [name] [address] [place], [place]  [address]  Dear [name],  We have been unable to contact you by telephone.  Please call our office, at your earliest convenience, so that we may speak with you.   Sincerely,   Dutch Quint RN

## 2011-01-23 NOTE — Letter (Signed)
Summary: [facility]//ORDERS  [facility]//ORDERS   Imported By: Arta  11/09/2010 10:57:40  _____________________________________________________________________  External Attachment:    Type:   Image     Comment:   External Document

## 2011-01-23 NOTE — Discharge Summary (Signed)
Summary: Discharge Summary    NAME:  [name] [name], [name] [name]                         ACCOUNT NO.:  [id number]   MEDICAL RECORD NO.:  [id number]                   PATIENT TYPE:  INP   LOCATION:  0460                                 FACILITY:  [facility]   PHYSICIAN:  Rene Paci, M.D. [facility]          DATE OF BIRTH:  1941-06-16   DATE OF ADMISSION:  04/29/2003  DATE OF DISCHARGE:  05/03/2003                                 DISCHARGE SUMMARY   DISCHARGE DIAGNOSES:  1. Nausea, vomiting secondary to esophageal stricture, resolved.  2. Esophageal stricture status post dilation May 7.  3. Erosive gastritis.  4. Upper gastrointestinal bleed with coffee ground emesis secondary to     above, resolved.  5. Iron deficiency anemia secondary to above status post 2 units packed red     blood cells transfusion, hemoglobin 10.3.  Hemodynamically stable.  6. Fever secondary to pneumonia, probable aspiration, improved.  Continue     outpatient antibiotics.  7. Multiple sclerosis with right-sided weakness diagnosed four years ago     followed by [name] [name], M.D., stable.  8. 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  The patient to avoid NSAIDs including Cox-II inhibitors.   DISCHARGE MEDICATIONS:  1. Nexium 40 mg p.o. b.i.d.  2. Hemocyte Plus vitamin tablet with iron one tablet p.o. b.i.d.  3. Ceftin 500 mg p.o. b.i.d. x10 additional days.  4. Vicodin 5/500 one to two tablets q.6h. p.r.n. pain.  5. The patient may also continue Elavil 25 mg p.o. q.h.s. as prior to     admission.   The patient understands to avoid all NSAIDs including Cox-II inhibitors.  Hospital follow-up with be with [name]. Marina Goodell, M.D. [facility], her GI physician to  be arranged in approximately one month.  The patient has telephone number  and will call to arrange appointment.  She may also continue as scheduled  outpatient follow-up with her orthopedic doctor and neurologist as  previously  scheduled.  At this time patient has no primary care physician  but she is encouraged to call any of the  clinics to establish  primary care as at this time until she establishes she will continue to be  considered unassigned.   CONDITION ON DISCHARGE:  Medically stable and improved.   DISPOSITION:  The patient is discharged to home where she lives with her  husband and family.  No home health agencies are needed at this time.   HOSPITAL COURSE:  Problem 1 - NAUSEA, VOMITING, AND DYSPHAGIA:  The patient  is a pleasant woman with multiple sclerosis who has previous history of  esophageal strictures requiring dilation who presented to the emergency room  after two to three days of nausea, vomiting, and increasing difficulty  choking her food down.  She had recently begun with coffee ground like  emesis on the day prior to admission prompting ER evaluation.  GI was  immediately consulted and the next day upper endoscopy was performed by [name]. Marina Goodell, M.D. [facility] confirming erosive gastritis likely causing the coffee  ground emesis.  There was also a stricture which was dilated at the time of  the procedure.  After the procedure patient had resolution of the dysphagia  and no recurrent nausea or vomiting.  The patient is to continue on b.i.d.  pro time pump inhibitor and follow up with GI as scheduled.  She is also to  avoid NSAIDs including Vioxx which had been prescribed for her  osteoarthritis.  She is given a short supply of Vicodin for her pain until  she can be reevaluated by her orthopedic physician.   Problem 2 - FEVER WITH LEFT LOWER LOBE INFILTRATE:  On presentation to the  emergency room patient had a fever of 101.5.  Urinalysis and urine culture  were negative.  O2 saturations were good on room air saturation but chest x-  ray did show left lower lobe atelectasis with possible effusion consistent  with possible pneumonia.  She was begun empirically at admission on IV   Rocephin.  As she is now swallowing and tolerating p.o. medications and food  without difficulty, she will be changed to oral Ceftin to continue a 14-week  total course antibiotic treatment for possible aspiration pneumonia.  She  has been afebrile for greater than 27 hours since admission.  She was  encouraged to call any of the  primary care offices and reestablish  primary care and to follow up on this and her other chronic medical issues.   Problem 3 - OTHER MEDICAL ISSUES INCLUDING OSTEOARTHRITIS AND MULTIPLE  SCLEROSIS:  These were at her baseline and remained stable during this  hospitalization.  No other medication changes were made.                                               Rene Paci, M.D. [facility]    VL/MEDQ  D:  05/03/2003  T:  05/03/2003  Job:  [id number]

## 2011-01-23 NOTE — Progress Notes (Signed)
Summary: [facility] letter  Phone Note Call from Patient   Summary of Call: The pt would like the provider write down a letter that states that she is unable to do her daily duties because she fracture her left  shoulder and she slso wanted to know if this letter can be send to [facility],   Pt has MS on the left side. [facility] Initial call taken by: Manon Hilding,  June 16, 2010 11:38 AM  Follow-up for Phone Call        forward to N. Daphine Deutscher, FNP Follow-up by: Levon Hedger,  June 16, 2010 11:43 AM  Additional Follow-up for Phone Call Additional follow up Details #1::        ??? when did pt get a left shoulder fracture I've not seen pt for this Dx yes, she does have MS and uses a walker at baseline. Call [facility] and see what they need - they can fax an order to [place] for provider to review and sign Additional Follow-up by: Lehman Prom FNP,  June 19, 2010 8:32 AM    Additional Follow-up for Phone Call Additional follow up Details #2::    SPOKE WITH CARE MANAGER DIANE AT [facility] SHE STATES THAT THE PT IS RECEIVING PHYSICAL THERAPY FROM  [facility] BUT SHE WOULD HAVE TO GET IN CONTACT WITH THE THERAPIST TO SEE IF PT NEEDS OT AND WOULD CONTACT us WITH THE INFORMATION. Levon Hedger  June 20, 2010 4:53 PM   Additional Follow-up for Phone Call Additional follow up Details #3:: Details for Additional Follow-up Action Taken: Will await further contact from [facility].  To date I have not received an order from them for any additional pt services  Additional Follow-up by: Lehman Prom FNP,  June 21, 2010 8:03 AM

## 2011-01-23 NOTE — Procedures (Signed)
Summary: EGD   EGD  Procedure date:  05/19/2002  Findings:      Location: [facility]  Findings: Stricture:  GERD  Hiatal Hernia  EGD  Procedure date:  05/19/2002  Findings:      Location: [facility]  Findings: Stricture:  GERD  Hiatal Hernia Patient Name: [name], [name] MRN: [id number] Procedure Procedures: Panendoscopy (EGD) CPT: 43235.    with esophageal dilation. CPT: G9296129.  Personnel: Endoscopist: [name]. Marina Goodell, MD.  Exam Location: Exam performed in Endoscopy Suite.  Patient Consent: Procedure, Alternatives, Risks and Benefits discussed, consent obtained,  Indications Symptoms: Dysphagia.  History  Pre-Exam Physical: Performed May 19, 2002  Entire physical exam was normal.  Exam Exam Info: Maximum depth of insertion Duodenum, intended Duodenum. Patient position: on left side. Vocal cords visualized. Gastric retroflexion performed. Images taken. ASA Classification: II. Tolerance: excellent.  Sedation Meds: Demerol 60 mg. given IV. Versed 6 mg. given IV.  Monitoring: BP and pulse monitoring done. Oximetry used. Supplemental O2 given  Fluoroscopy: Fluoroscopy was used.  Findings HIATAL HERNIA: Regular, ICD9: Hernia, Hiatal: 553.3. - STRICTURE / STENOSIS: Stricture in Distal Esophagus.  Constriction: partial. Etiology: benign due to reflux. 35 cm from mouth. ICD9: Esophageal Stricture: 530.3. Comment: Associated esophagitis present but improved from prior exam.  - Dilation: Distal Esophagus. Procedure was performed under Fluoroscopy. Wire Guided/Savary (Wilson-Cook) dilator used, Diameter: 15,16,17 mm, No Resistance, Moderate Heme present on extraction. 3  total dilators used. Patient tolerance excellent. Outcome: successful.   Assessment Abnormal examination, see findings above.  Diagnoses: 530.81: GERD.  530.3: Esophageal Stricture.  553.3: Hernia, Hiatal.   Events  Unplanned Intervention: No unplanned interventions were  required.  Unplanned Events: There were no complications. Plans Instructions: Nothing to eat or drink for 2 hrs.  Clear or full liquids: 2 hrs. Resume previous diet: am.  Medication(s): Continue current medications.  Patient Education: Patient given standard instructions for: Stenosis / Stricture.  Comments: Continue iron and Prevacid at current dosages Disposition: After procedure patient sent to recovery. After recovery patient sent home.  Scheduling: Call office for appointment, to [name]. Marina Goodell, MD, in 3 months. Call sooner for interval problems.    This report was created from the original endoscopy report, which was reviewed and signed by the above listed endoscopist.   cc:  Romero Belling, MD      Marcelino Freestone, MD

## 2011-01-23 NOTE — Procedures (Signed)
Summary: EGD    EGD  Procedure date:  08/16/2003  Findings:      Findings: Stricture:  /GERD Location: [facility]    EGD  Procedure date:  08/16/2003  Findings:      Findings: Stricture:  /GERD Location: [facility]   Patient Name: [name] [name], [name] [name] MRN: [id number] Procedure Procedures: Panendoscopy (EGD) CPT: 43235.    with esophageal dilation. CPT: G9296129.  Personnel: Endoscopist: [name]. Marina Goodell, MD.  Exam Location: Exam performed in Endoscopy Suite.  Patient Consent: Procedure, Alternatives, Risks and Benefits discussed, consent obtained,  Indications Symptoms: Dysphagia.  History  Pre-Exam Physical: Performed Aug 16, 2003  Entire physical exam was normal.  Exam Exam Info: Maximum depth of insertion Duodenum, intended Duodenum. Patient position: on left side. Vocal cords visualized. Gastric retroflexion performed. Images taken. ASA Classification: II. Tolerance: excellent.  Sedation Meds: Demerol 70 mg. given IV. Versed 8 mg. given IV.  Monitoring: BP and pulse monitoring done. Oximetry used. Supplemental O2 given  Fluoroscopy: Fluoroscopy was used.  Findings HIATAL HERNIA:  STRICTURE / STENOSIS: Stricture in Distal Esophagus.  Constriction: partial. Etiology: benign due to reflux. 32 cm from mouth. Lumen diameter is 15 mm. ICD9: Esophageal Stricture: 530.3. Comment: No active esophagitis.  - Dilation: Distal Esophagus. Procedure was performed under Fluoroscopy. Wire Guided/Savary (Wilson-Cook) dilator used, Diameter: 18 mm, No Resistance, No Heme present on extraction. 1  total dilators used. Patient tolerance excellent.   Assessment Abnormal examination, see findings above.  Diagnoses: 530.3: Esophageal Stricture.  530.81: GERD.   Events  Unplanned Intervention: No unplanned interventions were required.  Unplanned Events: There were no complications. Plans Medication(s): Continue current medications.  Comments: STAY ON  NEXIUM AND IRON EVERY DAY Disposition: After procedure patient sent to recovery. After recovery patient sent home.  Scheduling: Call office for appointment, to [name]. Marina Goodell, MD, in about 3 months    This report was created from the original endoscopy report, which was reviewed and signed by the above listed endoscopist.   cc:  Romero Belling, MD      The patient

## 2011-01-23 NOTE — Discharge Summary (Signed)
Summary: Discharge Summary  NAME:  [name] [name], [name] [name]               ACCOUNT NO.:  [id number]      MEDICAL RECORD NO.:  [id number]          PATIENT TYPE:  INP      LOCATION:  1442                         FACILITY:  [facility]      PHYSICIAN:  [name], M.D.DATE OF BIRTH:  14-Dec-1941      DATE OF ADMISSION:  07/24/2007   DATE OF DISCHARGE:                                  DISCHARGE SUMMARY      FINAL DISCHARGE DIAGNOSES:   1. Upper gastrointestinal bleed, etiology likely to be erosive       esophagitis.   2. Aspiration pneumonia, improving.      PROCEDURES:  Upper GI endoscopy which showed hiatal hernia, esophagitis,   esophageal stricture which did not require dilatation.  There was no   evidence of recent bleeding.  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      CONDITION ON DISCHARGE:  Stable.      MEDICATIONS ON DISCHARGE:  Continue with home medications.  Also:      1. Augmentin 875 mg b.i.d. for one week.   2. Ferrous sulfate 325 mg b.i.d. to continue.      HISTORY:  This very pleasant lady of 85 came in with vomiting black,   charcoal emesis for the last 3 days and also had a cough associated with   a fever.  Please see initial history and physical examination done by   myself.      HOSPITAL PROGRESS:  The patient was admitted and her admitting   hemoglobin was 10.2.  This then decreased to the 8.2 range.  However, a   transfusion was not given at this point and her hemoglobin was merely   watched.  She was seen by gastroenterology, who did an upper GI   endoscopy which did not show any source of bleeding but she did have an   esophageal stricture, which was not deemed to need any dilatation at   this stage.  Chest x-ray had shown an left-sided pneumonia, likely   aspiration, and she was treated with intravenous antibiotics with Zosyn   and clindamycin for this.  She seemed  to be doing well and she did well   in the subsequent few days.  On date of discharge today she is doing   well and tolerating a diet without any further vomiting.  There is no   significant cough or shortness of breath.      PHYSICAL EXAMINATION:  Temperature 99, blood pressure 148/68, pulse 68,   saturation 96% on room air.   There were no new physical findings, and her lung fields are clinically   clear.      Chest x-ray was done yesterday, which actually shows an improvement in   the pneumonia/infiltrate.  Blood work today shows hemoglobin 8.9, white   blood cell count 5.2, platelets 417.  Sodium 141, potassium 4.0,   bicarbonate 25, BUN 12, creatinine 0.85.  FURTHER DISPOSITION:  I am going to send her home with another week's   course of Augmentin 875 mg b.i.d. and a prescription for ferrous sulfate   325 mg b.i.d.  I have urged her to see her primary care physician, Dr.   Delrae Alfred, in about one week's time for repeat of her CBC, and also she   may need a repeat of her chest x-ray.               [name], M.D.   Electronically Signed            NCG/MEDQ  D:  07/28/2007  T:  07/28/2007  Job:  [id number]      cc:   Marcene Duos, M.D.

## 2011-01-23 NOTE — Progress Notes (Signed)
Summary: PRESSURE ULCER ON BOTTOM  Phone Note Other Incoming Call back at [phone]- CELL   Caller: [facility] Summary of Call: [name] [name] PT. NURSE WENT TO SEE [name] [name] TODAY AND SHE NOTICED THAT SHE HAS A PRESSURE ULCER ON HER BOTTOM, AND SHE WANTS TO KNOW IF SHE CAN GET A VERBAL ORDER TO TREAT HER. Initial call taken by: Leodis Rains,  November 03, 2010 2:29 PM  Follow-up for Phone Call        yes, addressed in a previous phone note - call [facility] and give verbal to treat Follow-up by: Lehman Prom FNP,  November 03, 2010 5:29 PM  Additional Follow-up for Phone Call Additional follow up Details #1::        Left message for return call.  Dutch Quint RN  November 03, 2010 5:40 PM  Per Neysa Bonito at [facility] -- Pt. has stage II on buttocks and sacrum, very small. Treated with hydrocolloid dressing -- verbal order given to nurse per provider's instructions. Additional Follow-up by: Dutch Quint RN,  November 06, 2010 9:40 AM

## 2011-01-23 NOTE — Procedures (Signed)
Summary: Colonoscopy   Colonoscopy  Procedure date:  10/09/2005  Findings:      Location:  [facility].  Results: Diverticulosis.         Procedures Next Due Date:    Colonoscopy: 10/2010 Patient Name: [name] [name], [name] [name] MRN: [id number] Procedure Procedures: Colonoscopy CPT: 54098.  Personnel: Endoscopist: [name]. Marina Goodell, MD.  Exam Location: Exam performed in Outpatient Clinic. Outpatient  Patient Consent: Procedure, Alternatives, Risks and Benefits discussed, consent obtained, from patient. Consent was obtained by the RN.  Indications  Surveillance of: Adenomatous Polyp(s). This is an initial surveillance exam. Initial polypectomy was performed in 2003. in Mar. 1-2 Polyps were found at Index Exam. Largest polyp removed was 1 to 5 mm. Prior polyp located in distal colon. Pathology of worst  polyp: tubular adenoma.  History  Current Medications: Patient is not currently taking Coumadin.  Pre-Exam Physical: Performed Aug 16, 2003. Entire physical exam was normal.  Exam Exam: Extent of exam reached: Cecum, extent intended: Cecum.  The cecum was identified by appendiceal orifice and IC valve. Patient position: left side to back. Colon retroflexion performed. Images taken. ASA Classification: III. Tolerance: excellent.  Monitoring: Pulse and BP monitoring, Oximetry used. Supplemental O2 given.  Colon Prep Used MIRALAX for colon prep. Prep results: good.  Sedation Meds: Patient assessed and found to be appropriate for moderate (conscious) sedation. Fentanyl 75 mcg. given IV. Versed 7 mg. given IV.  Findings NORMAL EXAM: Cecum to Rectum.  - DIVERTICULOSIS: Ascending Colon to Sigmoid Colon. ICD9: Diverticulosis, Colon: 562.10.   Assessment  Diagnoses: 562.10: Diverticulosis, Colon.   Comments: NO POLYPS SEEN Events  Unplanned Interventions: No intervention was required.  Unplanned Events: There were no complications. Plans Disposition: After  procedure patient sent to recovery. After recovery patient sent home.  Scheduling/Referral: Colonoscopy, to [name]. Marina Goodell, MD, IN 5 YEARS,    This report was created from the original endoscopy report, which was reviewed and signed by the above listed endoscopist.   cc:  Romero Belling, MD      Marcelino Freestone, MD

## 2011-01-23 NOTE — Procedures (Signed)
Summary: EGD   EGD  Procedure date:  06/16/2003  Findings:      Location: [facility]  Findings: Stricture:  GERD Patient Name: [name] [name], [name] [name] MRN: [id number] Procedure Procedures: Panendoscopy (EGD) CPT: 43235.    with esophageal dilation. CPT: G9296129.  Personnel: Endoscopist: [name]. Marina Goodell, MD.  Exam Location: Exam performed in Endoscopy Suite.  Patient Consent: Procedure, Alternatives, Risks and Benefits discussed, consent obtained,  Indications  Therapeutics: Reason for exam: Esophageal dilation.  Symptoms: Dysphagia.  History  Pre-Exam Physical: Performed Jun 16, 2003  Entire physical exam was normal.  Exam Exam Info: Maximum depth of insertion Duodenum, intended Duodenum. Patient position: on left side. Vocal cords visualized. Gastric retroflexion performed. Images taken. ASA Classification: III. Tolerance: excellent.  Sedation Meds: Demerol 50 mg. given IV. Versed 5 mg. given IV.  Monitoring: BP and pulse monitoring done. Oximetry used. Supplemental O2 given  Fluoroscopy: Fluoroscopy was used.  Findings HIATAL HERNIA:  STRICTURE / STENOSIS: Stricture in Distal Esophagus.  Constriction: partial. Etiology: benign due to reflux. 32 cm from mouth. Lumen diameter is 14 mm. ICD9: Esophageal Stricture: 530.3. Comment: Scarring and pseudodiverticla. No significant active esophagitis.  - Dilation: Distal Esophagus. Procedure was performed under Fluoroscopy. Wire Guided/Savary (Wilson-Cook) dilator used, Diameter: 16,17,18 mm, No Resistance, Minimal Heme present on extraction. 3  total dilators used. Patient tolerance excellent. Comments: heme on largest dilator only.   Assessment Abnormal examination, see findings above.  Diagnoses: 530.3: Esophageal Stricture.  530.81: GERD.   Events  Unplanned Intervention: No unplanned interventions were required.  Unplanned Events: There were no complications. Plans Instructions: Nothing to eat or drink  for 2 hrs.  Clear or full liquids: 2 hrs. Resume previous diet:  am.  Medication(s): Continue current medications.  Disposition: After procedure patient sent to recovery. After recovery patient sent home.  Comments: Call Dr. Lamar Sprinkles nurse [facility] @ [phone]) to set up repeat esophageal dilation in about 8 weeks.  This report was created from the original endoscopy report, which was reviewed and signed by the above listed endoscopist.   cc:  The Patient

## 2011-01-23 NOTE — Progress Notes (Signed)
Summary: CONGESTION AND COUGH/ASKING FOR MEDS  Phone Note Call from Patient Call back at Home Phone [phone]   Reason for Call: Acute Illness Summary of Call: MARTIN PT. MS Neels WANTS TO KNOW IF YOU CAN CALL IN SOMETHING FOR HER CONGESTION IN HER CHEST. SHE JUST HAS A BAD COUGH, SHE SAYS SHE HAS NO FEVER. SHE DOESN'T WANT TO COME OUT IN THIS WEATHER. [facility]ES [facility] [address]. MS Colocho WOULD LIKE FOR SOMEONE TO CALL HER AND LET HER KNOW IF YOU CAN. Initial call taken by: Leodis Rains,  January 27, 2010 2:21 PM  Follow-up for Phone Call        spoke with pt she says that she has alot of chest congestion that has been going on for 4 days with alttle headache, no appetite every thing taste bad, no fever.  She says that she has been drinking and that she has taken alka seltzer plus cold.  She wants to know what she can take to help with this Follow-up by: Levon Hedger,  January 27, 2010 4:39 PM  Additional Follow-up for Phone Call Additional follow up Details #1::        spoke with patient who was concerned that she hasnt heard from provider...advised pt we would run this information by provider in the morning to see if she needs to come in or if she is willing to call something into pharmacy... Additional Follow-up by: Mikey College CMA,  January 31, 2010 4:30 PM    Additional Follow-up for Phone Call Additional follow up Details #2::    medicare/medicaid will not cover any cough/congestion med if pt has not had any fever then would advise she drink plenty of fluids Will advise she try mucinex (Plain - NO DM) If still ongoing at beginning of next week will advise she schedule an office visit Follow-up by: Lehman Prom FNP,  February 01, 2010 12:48 PM  Additional Follow-up for Phone Call Additional follow up Details #3:: Details for Additional Follow-up Action Taken: Levon Hedger  February 01, 2010 12:55 PM Spoke with pt and she is informed of the the above  information. Additional Follow-up by: Levon Hedger,  February 01, 2010 12:56 PM   Appended Document: CONGESTION AND COUGH/ASKING FOR MEDS pt states she has no fever and would call to schedule appt if not feeling any better.

## 2011-01-23 NOTE — Letter (Signed)
Summary: New Patient letter  [facility]  [address] [place], [place] [address]   Phone: [phone]  Fax: [phone]       08/01/2010 MRN: [id number]  JUDITH Laramie [address] [place], [place]  [address]  Dear Ms. Polimeni,  Welcome to the Gastroenterology Division at [facility].    You are scheduled to see Dr. Marina Goodell on 09/12/2010 at 1:45PM on the 3rd floor at [facility], [address]  We ask that you try to arrive at our office 15 minutes prior to your appointment time to allow for check-in.  We would like you to complete the enclosed self-administered evaluation form prior to your visit and bring it with you on the day of your appointment.  We will review it with you.  Also, please bring a complete list of all your medications or, if you prefer, bring the medication bottles and we will list them.  Please bring your insurance card so that we may make a copy of it.  If your insurance requires a referral to see a specialist, please bring your referral form from your primary care physician.  Co-payments are due at the time of your visit and may be paid by cash, check or credit card.     Your office visit will consist of a consult with your physician (includes a physical exam), any laboratory testing he/she may order, scheduling of any necessary diagnostic testing (e.g. x-ray, ultrasound, CT-scan), and scheduling of a procedure (e.g. Endoscopy, Colonoscopy) if required.  Please allow enough time on your schedule to allow for any/all of these possibilities.    If you cannot keep your appointment, please call [phone] to cancel or reschedule prior to your appointment date.  This allows [place] the opportunity to schedule an appointment for another patient in need of care.  If you do not cancel or reschedule by 5 p.m. the business day prior to your appointment date, you will be charged a $50.00 late cancellation/no-show fee.    Thank you for choosing  [facility] for your medical needs.  We appreciate the opportunity to care for you.  Please visit [place] at our website  to learn more about our practice.                     Sincerely,                                                             The Gastroenterology Division

## 2011-01-23 NOTE — Progress Notes (Signed)
Summary: Meds list  Phone Note Call from Patient   Summary of Call: CALLED TO GIVE YOU THE NAMES OF THE MEDS SHE WAS GOING TO TELL YOU OXYSUTYNIN-5 MG DICLOFENAC SODIC -75 MG GABAPENTINE-300MG  OMETRAZOLE DR. 20 MG AMITRIPTYLINE HCL 50 MG PHONE # [phone] Initial call taken by: Arta Bruce,  November 21, 2010 11:53 AM  Follow-up for Phone Call        Sent to N. Daphine Deutscher.  Dutch Quint RN  November 21, 2010 4:18 PM   Additional Follow-up for Phone Call Additional follow up Details #1::        notify pt that she should be taking calcium + vitamin D supplement two times a day (buy over the counter) She should also be taking a pill for her bones once weekly also I will start her on a medication to help with her mood - fluoxeting 20mg  by mouth daily (Rx in your basket - fax to pt's pharmacy) Additional Follow-up by: Lehman Prom FNP,  November 22, 2010 2:39 PM    Additional Follow-up for Phone Call Additional follow up Details #2::    Pt. advised of provider's insructions and new Rxs - Rxs [facility]ed to [facility].  Dutch Quint RN  November 22, 2010 3:05 PM   New/Updated Medications: ALENDRONATE SODIUM 35 MG TABS (ALENDRONATE SODIUM) One tablet by mouth WEEKLY for bones FLUOXETINE HCL 20 MG TABS (FLUOXETINE HCL) One tablet by mouth daily for mood Prescriptions: ALENDRONATE SODIUM 35 MG TABS (ALENDRONATE SODIUM) One tablet by mouth WEEKLY for bones  #4 x 11   Entered and Authorized by:   Lehman Prom FNP   Signed by:   Lehman Prom FNP on 11/22/2010   Method used:   Printed then faxed to ...       [phone]* (retail)       [address]       [place], [place]  [address]       Ph: [phone] or [phone]       Fax: [phone]   RxID:   [id number] FLUOXETINE HCL 20 MG TABS (FLUOXETINE HCL) One tablet by mouth daily for mood  #30 x 5   Entered and Authorized by:   Lehman Prom FNP   Signed by:   Lehman Prom FNP on 11/22/2010   Method used:   Printed then faxed to ...       [facility] [address] [facility] [address] [phone]* (retail)       [address]       [place], [place]  [address]       Ph: [phone] or [phone]       Fax: [phone]   RxID:   [id number]

## 2011-01-23 NOTE — Assessment & Plan Note (Signed)
Summary: Acute cystitis  Nurse Visit   Allergies: No Known Drug Allergies Laboratory Results   Urine Tests  Date/Time Received: April 12, 2010 2:31 PM   Routine Urinalysis   Glucose: negative   (Normal Range: Negative) Bilirubin: large   (Normal Range: Negative) Ketone: negative   (Normal Range: Negative) Spec. Gravity: 1.015   (Normal Range: 1.003-1.035) Blood: small   (Normal Range: Negative) pH: 6.0   (Normal Range: 5.0-8.0) Protein: negative   (Normal Range: Negative) Urobilinogen: 0.2   (Normal Range: 0-1) Nitrite: positive   (Normal Range: Negative) Leukocyte Esterace: moderate   (Normal Range: Negative)    Date/Time Received: April 12, 2010 2:36 PM   Wet Mount Source: vaginal WBC/hpf: 1-5 Bacteria/hpf: rare Clue cells/hpf: none Yeast/hpf: none Wet Mount KOH: Negative Trichomonas/hpf: none   Orders Added: 1)  Est. Patient Level I [40981] 2)  UA Dipstick w/o Micro (manual) [81002] 3)  KOH/ WET Mount [87210] 4)  T-Culture, Urine [[id number]] Prescriptions: MACROBID 100 MG CAPS (NITROFURANTOIN MONOHYD MACRO) One tablet by mouth two times a day for infection  #14 x 0   Entered by:   Armenia Shannon   Authorized by:   Lehman Prom FNP   Signed by:   Arm[place] Shannon on 04/12/2010   Method used:   Print then Give to Patient   RxID:   [id number]   Patient Instructions: 1)  Your urine does have infection today. 2)  You will need to take macrobid 100mg  by mouth two times a day  3)  Your urine will be sent for culture to be sure this antibiotic will kill the bacteria

## 2011-01-23 NOTE — Discharge Summary (Signed)
Summary: Discharge summary  NAME:  [name] [name], [name] [name]               ACCOUNT NO.:  [id number]      MEDICAL RECORD NO.:  [id number]          PATIENT TYPE:  INP      LOCATION:  1345                         FACILITY:  [facility]      PHYSICIAN:  Hillery Aldo, M.D.   DATE OF BIRTH:  [birth year]      DATE OF ADMISSION:  01/23/2008   DATE OF DISCHARGE:  01/29/2008                                  DISCHARGE SUMMARY      PRIMARY CARE PHYSICIAN:  [name] at [facility].      DISCHARGE DIAGNOSES:   1. Left lower lobe pneumonia.   2. Coagulase-negative Staphylococcus urinary tract infection.   3. Multiple sclerosis.   4. Constipation.   5. History of diverticulosis.   6. Gastroesophageal reflux disease.   7. Hiatal hernia.   8. Obesity.      DISCHARGE MEDICATIONS:   1. Neurontin 300 mg t.i.d.   2. Reglan 10 mg q.a.c. and h.s.   3. Betasone 0.3 mg subcutaneously every other day.   4. Flavoxate 100 mg t.i.d. p.r.n.   5. Multivitamin daily.   6. Omeprazole 20 mg b.i.d.   7. Oxybutynin chloride 5 mg b.i.d.   8. Trimethoprim 100 mg daily.   9. Valium 1 mg b.i.d. p.r.n.   10.Amitriptyline 100 mg q.h.s.   11.Nabumetone as directed.   12.Levaquin 500 mg daily x7 days.      CONSULTATIONS:  None.      BRIEF ADMISSION HISTORY AND PHYSICAL:  The patient is a 70 year old   female who presented to the hospital with weakness and fall.  She spent   the night in a chair because she was unable to move herself out of the   chair and subsequently called the ambulance the next day.  Although she   denied any specific fever or chills, she was noted to have a cough with   a change in mucus production.  Upon initial evaluation, she was found to   have a left lower lobe pneumonia and evidence of urinary tract infection   and therefore was admitted.      PROCEDURES AND DIAGNOSTIC STUDIES:   1. Chest x-ray on January 23, 2008 showed left retrocardiac opacity       suspicious for pneumonia.   2. Swallowing  function evaluation on January 26, 2008 was normal with       no signs of aspiration or penetration.   3. Chest x-ray on January 28, 2008 showed radiographically improved       but not completely cleared left lower lobe pneumonia.      DISCHARGE LABORATORY VALUES:  Sodium 135, potassium 4.2, chloride 104,   bicarb 23, BUN 12, creatinine 0.89, glucose 106.  White blood cell count   was 7.6, hemoglobin 11.2, hematocrit 34.5, platelets 385.      HOSPITAL COURSE BY PROBLEM:   1. Left lower lobe pneumonia:  The patient was admitted and       empirically put on a combination of Rocephin and azithromycin.  Her  elevated white blood cell count normalized.  The patient remained       afebrile with increasing energy throughout the course of her       hospital stay.  This time, she feels back to her baseline.  Her       antibiotics have been transitioned over to p.o. Levaquin.  She will       complete an additional 7 days of treatment with Levaquin as an       outpatient.   2. Coagulase negative Staphylococcus urinary tract infection:  The       patient did have antibiotics as noted above.  A follow-up urine       culture was done yesterday and so far is showing no growth.   3. Multiple sclerosis:  The patient was continued on her usual       outpatient regimen.   4. Constipation:  The patient was put on Senokot, Dulcolax, and given       a Fleet's enema to address this.   5. Gastroesophageal reflux disease/hiatal hernia:  The patient was       maintained on proton pump inhibitor therapy.   6. Questionable dysphagia:  The patient did have a swallowing       evaluation done which was normal.      DISPOSITION:  The patient is medically stable and will be discharged   home.  We will set up home physical therapy for her. She is instructed   to follow up with her primary care physician in 1-2 weeks.               Hillery Aldo, M.D.   Electronically Signed            CR/MEDQ  D:   01/29/2008  T:  01/30/2008  Job:  [id number]      cc:   Marcene Duos, M.D.

## 2011-01-23 NOTE — Procedures (Signed)
Summary: EGD   EGD  Procedure date:  10/09/2005  Findings:      Location: [facility]  Findings: Stricture:  GERD Hiatal Hernia Patient Name: [name] [name], [name] [name] MRN: [id number] Procedure Procedures: Panendoscopy (EGD) CPT: 43235.  Personnel: Endoscopist: [name]. Marina Goodell, MD.  Exam Location: Exam performed in Outpatient Clinic. Outpatient  Patient Consent: Procedure, Alternatives, Risks and Benefits discussed, consent obtained, from patient. Consent was obtained by the RN.  Indications Symptoms: Dysphagia. Reflux symptoms  History  Current Medications: Patient is not currently taking Coumadin.  Pre-Exam Physical: Performed Aug 16, 2003  Entire physical exam was normal.  Exam Exam Info: Maximum depth of insertion Duodenum, intended Duodenum. Patient position: on left side. Vocal cords visualized. Gastric retroflexion performed. Images taken. ASA Classification: III. Tolerance: excellent.  Sedation Meds: Patient assessed and found to be appropriate for moderate (conscious) sedation. Residual sedation present from prior procedure today. Fentanyl 25 mcg. given IV. Versed 3 mg. given IV.  Monitoring: BP and pulse monitoring done. Oximetry used. Supplemental O2 given  Fluoroscopy: Fluoroscopy was not used.  Findings HIATAL HERNIA:  STRICTURE / STENOSIS: Stricture in Distal Esophagus.  Constriction: partial. Etiology: benign due to reflux. 33 cm from mouth. Lumen diameter is 14 mm. ICD9: Esophageal Stricture: 530.3. Comment: NO active esophagitis. Stricture complex and associated with small pseudodiverticula.Dilation would require fluoroscopic assistance.  - Normal: Fundus to Duodenal Apex.   Assessment  Diagnoses: 530.3: Esophageal Stricture.  553.3: Hernia, Hiatal.  530.81: GERD.   Events  Unplanned Intervention: No unplanned interventions were required.  Unplanned Events: There were no complications. Plans Medication(s): Continue current  medications.  Disposition: After procedure patient sent to recovery. After recovery patient sent home.  Comments: Patient to schedule esophageal dilation with fluoro @ hospital at her convenience.Call Dr. Lamar Sprinkles nurse,Cheryl,at [phone] arrange.  This report was created from the original endoscopy report, which was reviewed and signed by the above listed endoscopist.   cc:  Romero Belling, MD      Marcelino Freestone, MD      The Patient

## 2011-01-23 NOTE — Discharge Summary (Signed)
Summary: Discharge Summary   NAME:  [name] [name], [name] [name]               ACCOUNT NO.:  [id number]   MEDICAL RECORD NO.:  [id number]          PATIENT TYPE:  INP   LOCATION:  1318                         FACILITY:  [facility]   PHYSICIAN:  Isidor Holts, M.D.  DATE OF BIRTH:  08-Sep-1941   DATE OF ADMISSION:  09/11/2006  DATE OF DISCHARGE:  09/16/2006                                 DISCHARGE SUMMARY   PRIMARY MEDICAL DOCTOR:  Dineen Kid. Reche Dixon, M.D.   PRIMARY NEUROLOGIST:  Gustavus Messing. Orlin Hilding, M.D.   DISCHARGE DIAGNOSIS:  1. Community-acquired pneumonia.  2. Severe gastroesophageal reflux disease, secondary to large hiatal      hernia.  3. Urinary tract infection, secondary to Enterococcus.  4. Multiple sclerosis.  5. History of depression.  6. History of chronic iron-deficiency anemia.  7. Constipation.   DISCHARGE MEDICATIONS:  1. Elavil 50 mg p.o. nightly.  2. Neurontin 600 mg p.o. t.i.d.  3. Pepcid 20 mg p.o. b.i.d.  4. Nexium 40 mg p.o. b.i.d.  5. Ditropan 5 mg p.o. b.i.d.  6. MiraLax 17 gm p.o. daily.  7. Senokot-S 1 pill p.o. nightly.  8. Avelox 400 mg p.o. daily from September 16, 2006, for 7 days only.   Note: Nabumetone has been stopped, due to severe gastroesophageal reflux  disease.   PROCEDURES:  1. Portable chest x-ray dated September 11, 2006, that showed progression      of airspace disease in the left midlung zone. This may represent      pneumonia.  2. Chest CT scan dated September 11, 2006. This showed new left upper lobe      and lingular infiltrates most consistent with pneumonia. Left lower      lobe airspace disease improved since March 2007 and right lower lobe      about the same. Thickening of the esophagus likely related to      esophagitis.  3. Two-view chest x-ray dated September 14, 2006. This showed improvement      in left lung pneumonia. There are some residual streaky densities in      the left posterior base compatible with subsegmental  atelectasis.   CONSULTATIONS:  1. Genene Churn. Love, M.D., Neurologist.  2. Speech pathologist.   ADMISSION HISTORY:  As in H&P note of September 01, 2006, dictated by Dr.  Jetty Duhamel. However, in brief, this is a 70 year old female, with known  history of multiple sclerosis under the care of Dr. Marcelino Freestone,  esophageal stricture, status post dilatation May 2007, large hiatal hernia  with severe gastroesophageal disease, and prior history of erosive  esophagitis, depression, sigmoid diverticulosis, chronic iron-deficiency  anemia, status post admission March 2007 and August 2007 for aspiration  pneumonia, who presents with progressive lower extremity weakness, general  debility, cough productive of thick, brownish colored phlegm, low-grade  fever. Initial evaluation showed a leukocytosis of 12.8. Chest x-ray  revealed left lung air space disease. She was admitted for further  evaluation, investigation, and management.   CLINICAL COURSE:  1. Community-acquired pneumonia. For presentation, refer to admission      history above. However,  this is a patient who has had episodes of      pneumonia in March and August 2007 deemed secondary to aspiration,      raising the suspicion of possible aspiration pneumonia on this      occasion. She was commenced initially on Zosyn, and speech pathology      evaluation was arranged. As initial chest x-ray showed findings      consistent with left lung airspace disease, which appeared to have been      progressive, raising the possibility of other underlying pathology,      chest CT scan was done and this demonstrated polylobar pulmonary      densities, for the most part left sided, with a small amount of      airspace disease in the right medial lung base. These areas showed air      bronchograms and no obvious mass effect. The most likely diagnosis was      deemed to be pneumonia. In addition, there was thickening of the      esophagus likely  related to esophagitis. The patient underwent speech      pathology evaluation on September 07, 2006. Modified barium swallow      examination showed a very normal swallow, although there was a      suspicion of possible aspiration secondary to reflux.  Regular diet was      recommended, as well as reflux precautions. Erythromycin was added to      the patient's antibiotic regimen because of the distinct possibility of      a community-acquired pneumonia. The patient has responded quite well to      above-mentioned treatment measures, and, by September 14, 2006, was      asymptomatic. The WBC has normalized and, as of September 15, 2006, was      6.5. The patient has remained afebrile during the course of her      hospitalization, is not short of breath, and certainly felt much      better. As a consequence, IV antibiotics have been discontinued on      September 15, 2006, after completion of the day's dosage. The patient      will be commenced on Avelox from September 16, 2006, p.o. to complete a      further 7-day course.   1. Enterococcal UTI. The patient was found to have a positive urinary      sediment, consistent with urinary tract infection. It was felt that the      above-mentioned antibiotic regimen would adequately cover this.      Subsequent urine culture demonstrated Enterococcus, sensitive to      ampicillin and the quinolones. Repeat urinalysis of September 14, 2006,      was negative consistent with resolution of infection. As of September 15, 2006, the patient had completed a 5-day course of Zosyn.   1. Severe gastroesophageal reflux disease. The patient has a known history      of large hiatal hernia/GERD and, in the past, has had erosive      esophagitis. As a matter of fact, at the time of presentation, she was      on a twice daily dosage of proton pump inhibitor. As mentioned above,     the patient has been evaluated by modified barium swallow examination       which showed no evidence of aspiration. It is likely the patient's  recurrent pneumonias may be secondary to aspiration, due to severe      reflux. H2RA has been added to her proton pump inhibition, with      satisfactory clinical effect. She has been strongly recommended to      observe reflux precautions.   1. Constipation. This was managed with appropriate bowel regimen, during      the course of the patient's hospitalization.   1. History of depression. The patient's mood remained stable throughout      her hospitalization, on Elavil.   1. Multiple sclerosis. The patient has been under regular followup with      Dr. Marcelino Freestone since 2002. At the time of this presentation, the      patient had increased weakness, particularly in the lower extremities      raising suspicion of possible flare of multiple sclerosis. As a matter      of fact, at some point, burst treatment with steroids was contemplated.      Dr. Avie Echevaria was called on neurology consultation and he recommended      managing underlying acute infective problems, utilizing physical      therapy/occupational therapy, and only to consider steroids, should      there be no improvement. Be that as it may, the patient clinically      improved with resolution of her acute problems. Underwent physical      therapy/occupational therapy while hospitalized and did very well with      this. By September 15, 2006, she was able to ambulate without      assistance.   1. History of chronic iron-deficiency anemia. Against a background of GERD      and diverticulosis. The patient's hemoglobin remained stable and      reasonable throughout the course of her hospitalization. As a matter of      fact, on September 15, 2006, hemoglobin was 10.9 with a hematocrit of      33.4.   DISPOSITION:  Provided no further acute issues supervene, it is anticipated  the patient will be discharged in satisfactory condition on September 16, 2006.   DIET:  Regular diet, with appropriate reflux precautions.   ACTIVITY:  As tolerated, otherwise per PT/OT.   WOUND CARE:  Not applicable.   PAIN MANAGEMENT:  Not applicable.   FOLLOWUP INSTRUCTIONS:  1. The patient is to follow up with Dr. Donia Guiles, her PMD, at [facility], within 1 week. She has been instructed to call for an      appointment and has verbalized understanding.  2. She is to follow up routinely with her primary neurologist, Dr.      Marcelino Freestone, per prior scheduled appointment.   SPECIAL INSTRUCTIONS:  Home health aide/PT/OT has been arranged. We  recommend the patient's primary M.D. to arrange a followup chest x-ray in 1  week. We have also recommended that the patient avoid NSAIDs, as this may  exacerbate severe gastroesophageal reflux disease. She may, however, use  Tylenol extra strength over the counter for pain.      Isidor Holts, M.D. Electronically Signed     CO/MEDQ  D:  09/15/2006  T:  09/16/2006  Job:  [id number]   cc:   Dineen Kid. Reche Dixon, M.D.  Fax: [phone]   Gustavus Messing. Orlin Hilding, M.D.  Fax: [phone]   Genene Churn. Love, M.D.  Fax: [phone]

## 2011-01-23 NOTE — Procedures (Signed)
Summary: Colonoscopy   Colonoscopy  Procedure date:  03/16/2002  Findings:      Location:  [facility].  Results: Hemorrhoids.     Results: Diverticulosis.       Tubular Adenoma  Procedures Next Due Date:    Colonoscopy: 03/2007 Patient Name: [name] [name], [name] [name] MRN: [id number] Procedure Procedures: Colonoscopy CPT: 86578.    with polypectomy. CPT: A3573898.  Personnel: Endoscopist: [name]. Marina Goodell, MD.  Exam Location: Exam performed in Outpatient Clinic. Outpatient  Patient Consent: Procedure, Alternatives, Risks and Benefits discussed, consent obtained, from patient. Consent was obtained by the RN.  Indications  Evaluation of: Anemia Microcytic.  Symptoms: Constipation Weight Loss.  Average Risk Screening Routine.  History  Pre-Exam Physical: Performed Mar 16, 2002. Entire physical exam was normal.  Exam Exam: Extent of exam reached: Ileum, extent intended: Ileum.  The cecum was identified by appendiceal orifice and IC valve. Patient position: left side to back. Colon retroflexion performed. Images taken. ASA Classification: III. Tolerance: excellent.  Monitoring: Pulse and BP monitoring, Oximetry used. Supplemental O2 given.  Colon Prep Used Golytely for colon prep. Prep results: fair, adequate exam.  Sedation Meds: Patient assessed and found to be appropriate for moderate (conscious) sedation. Fentanyl 100 mcg. given IV. Versed 5 mg. given IV.  Findings - DIVERTICULOSIS: Ascending Colon to Sigmoid Colon. ICD9: Diverticulosis, Colon: 562.10.  NORMAL EXAM: Cecum.  POLYP: Sigmoid Colon, Maximum size: 8 mm. sessile polyp. Procedure:  snare with cautery, removed, retrieved, Polyp sent to pathology. ICD9: Colon Polyps: 211.3.  HEMORRHOIDS: Internal. ICD9: Hemorrhoids, Internal: 455.0.   Assessment Abnormal examination, see findings above.  Diagnoses: 562.10: Diverticulosis, Colon.  211.3: Colon Polyps.  455.0: Hemorrhoids, Internal.    Events  Unplanned Interventions: No intervention was required.  Unplanned Events: There were no complications. Plans Patient Education: Patient given standard instructions for: Polyps.  Disposition: After procedure patient sent to recovery. After recovery patient sent home.  Scheduling/Referral: Colonoscopy, to [name]. Marina Goodell, MD, in 2 or 5 years pending pathologic results.,    This report was created from the original endoscopy report, which was reviewed and signed by the above listed endoscopist.   cc:  Marcelino Freestone, MD      Romero Belling, MD

## 2011-01-23 NOTE — Discharge Summary (Signed)
Summary: Discharge Summary   NAME:  [name] [name], [name] [name]               ACCOUNT NO.:  [id number]   MEDICAL RECORD NO.:  [id number]          PATIENT TYPE:  INP   LOCATION:  5703                         FACILITY:  [facility]   PHYSICIAN:  Rolm Gala, M.D.    DATE OF BIRTH:  05-13-1941   DATE OF ADMISSION:  07/25/2006  DATE OF DISCHARGE:  07/28/2006                                 DISCHARGE SUMMARY   CONSULTS:  None.   PROCEDURES:  A barium swallow study showed a moderate reflux with a hiatal  hernia.  An esophageal dysmotility.  No stricture.   REASON FOR ADMISSION:  This is a 70 year old female who was admitted for a  left lower lobe likely aspiration pneumonia and a history of MS.   DISCHARGE DIAGNOSES:  1.  Aspiration pneumonia left lower lobe.  2.  Severe reflux confirmed by barium swallow study.  3.  Multiple sclerosis.  4.  Status post esophageal stricture dilation May of 2004.  5.  History of depression.  6.  Cholecystectomy in the 1970s.   DISCHARGE MEDICATIONS:  1.  Augmentin 875 mg take one p.o. daily for 10 more days (total of 14      days).  2.  Nexium 40 mg take one p.o. b.i.d.  3.  Ditropan 5 mg take one p.o. b.i.d.  4.  Neurontin 600 mg take one p.o. 3 times a day, this is a new dose.  5.  Betaseron 0.2 mg take subcu every other day.  6.  Stop your amitriptyline.   DISPOSITION:  Patient will be discharged home to a wheelchair friendly where  she lives.  She will have home health PT at home.  Patient lives by herself,  but has a daughter and a son nearby who help her often.   DISCHARGE FOLLOWUP:  1.  Patient was told to make an appointment, call early Monday morning to      make an appointment with Dr. Marina Goodell with [facility] as soon as possible.  2.  Patient is to followup with Dr. Dennie Fetters as previously scheduled.  3.  Followup issues:  Patient will need to be followed up with Dr. Marina Goodell      about her hiatal hernia and her esophageal dysmotility.  We increased      her  Neurontin and stopped her Elavil; her pain will need to be      reassessed after she is on the new medication regimen in a few weeks.      Blood cultures from July 25, 2006 are no growth to date and will not be      final until July 30, 2006.   DISCHARGE CONDITION:  Patient is stable and improving.  She has clear  sensorium.  Her weakness has resolved to baseline.  She is on room air with  saturations 97% to 100%.   DISCHARGE LABS:  Electrolytes were normal on July 28, 2006.  A CBC on  admission showed a white count of 11.1, which is down to 6.4 on day of  discharge.  Her hemoglobin was 11.8 with an MCV  of 75.6.  Patient's  urinalysis was normal.  Her blood cultures showed no growth to date.  A  ferritin was normal at 39.   PENDING LABS:  Blood cultures are not yet final.   HOSPITAL COURSE:  1.  Aspiration pneumonia left lower lobe, likely secondary to severe reflux.      Patient was initially started on Zosyn and azithromycin for this      problem.  She was switched over to Augmentin greater than 24 hours prior      to discharge and had no fevers.  She had new weakness that quickly      resolved to baseline as her pneumonia improved.  Patient had vomiting      and cough prior to discharge and these have improved.  She did not need      O2 during her hospital stay.  Repeat chest x-ray may be done at primary      MDs discretion.  2.  Severe reflux.  Patient had a speech eval that showed no problems with      initial swallow, but given the severity of her reflux symptoms, we      ordered a barium swallow study, which showed as above.  Patient was      continued on her Nexium 40 mg p.o. b.i.d.  At discharge she was told to avoid caffeine, no snacks after 10 p.m.,  elevate the head of the bed.  No mints and keep tomatoes and citrus to a  minimum.  1.  Bladder incontinence.  This is probably related to multiple sclerosis.      A UA was negative and she did not complain of any urinary  symptoms, but      continued on her home dose of oxybutynin.  2.  Multiple sclerosis.  Patient came in weak with more weakness than her      baseline, but this quickly resolved as her pneumonia grew better.  On      her last admission, she was discharged to Rehab Unit, but we do not feel      that this is necessary at this time.  We had a PT/OT consult and PT will      be following her at home for home PT.  3.  Chronic pain.  Patient has chronic pain in her legs and feet, which she      on admission and discharge, was at baseline.  Given concerns with      amitriptyline given her age, we increased her Neurontin and stopped the      amitriptyline dose.  4.  Recurrent aspiration.  Patient has a history of esophageal stricture and      this is her third pneumonia admission history.  With the barium swallow      study, we just believe that this may be due to severe reflux.      Rolm Gala, M.D.     Bennetta Laos  D:  07/28/2006  T:  07/28/2006  Job:  [id number]   cc:   [name]. Marina Goodell, M.D. [facility]  Dennie Fetters, M.D.

## 2011-01-23 NOTE — Procedures (Signed)
Summary: EGD   EGD  Procedure date:  02/05/2001  Findings:      Location: [facility]  Findings: Stricture:  GERD Hiatal Hernia Patient Name: [name] [name], [name] [name] MRN: [id number] Procedure Procedures: Panendoscopy (EGD) CPT: 43235.    with esophageal dilation. CPT: [id number].  Personnel: Endoscopist: [name]. Marina Goodell, MD.  Exam Location: Exam performed in Endoscopy Suite.  Patient Consent: Procedure, Alternatives, Risks and Benefits discussed, consent obtained,  Indications Symptoms: Dysphagia. Reflux symptoms for >10 yrs, occurring daily.  History  Pre-Exam Physical: Performed Feb 05, 2001  Cardio-pulmonary exam, HEENT exam, Abdominal exam, Extremity exam, Neurological exam, Mental status exam WNL.  Exam Exam Info: Maximum depth of insertion Duodenum, intended Duodenum. Patient position: on left side. Vocal cords visualized. Gastric retroflexion performed. Images taken. ASA Classification: II. Tolerance: excellent.  Sedation Meds: Demerol 90 Versed 9 mg.  Monitoring: BP and pulse monitoring done. Oximetry used. Supplemental O2 given  Fluoroscopy: Fluoroscopy was used.  Findings STRICTURE / STENOSIS: Stricture in Distal Esophagus.  Constriction: complete. Etiology: benign due to reflux. Lumen diameter is 13 mm. ICD9: Esophageal Stricture: 530.3. Comment: scarred complex with no significant active inflammation.  - Dilation: Cardia. Procedure was performed under Fluoroscopy. Savary dilator used, Diameter: 16,17,18 mm, No Resistance, No Heme present on extraction. Patient tolerance excellent. Outcome: successful.  HIATAL HERNIA: ICD9: Hernia, Hiatal: 553.3.  Assessment Abnormal examination, see findings above.  Diagnoses: 530.3: Esophageal Stricture.  553.3: Hernia, Hiatal.  530.81: GERD.   Events  Unplanned Intervention: No unplanned interventions were required.  Unplanned Events: There were no complications. Plans Instructions: Nothing to eat or drink  for 2 hrs.  Clear or full liquids: 2 hrs. Resume previous diet: am.  Medication(s): Continue current medications.  Patient Education: Patient given standard instructions for: Reflux. Stenosis / Stricture.  Comments: Cont Nexium 40mg  DAILY Disposition: After procedure patient sent to recovery.  Scheduling: Clinic Visit, to [name]. Marina Goodell, MD, in about 4-6 weeks (patient to call for appt.).    This report was created from the original endoscopy report, which was reviewed and signed by the above listed endoscopist.

## 2011-01-23 NOTE — Progress Notes (Signed)
Summary: Ultrasound results  Phone Note Outgoing Call   Summary of Call: call pt's daughter (number under registration) Pt has a fatty liver.  just like there is fat everywhere else in the body extra fat has accumlated around the liver that is causing it to work extra hard. That is likely the cause for elevated enzymes. She can improve this by avoided fried fatty foods. Eat low fat food options.  This may be a problem more now than before because pt is eating food in rehab which may not be what she was used to at home. We will continue to monitor her labs. no other action needed at this time. Initial call taken by: Lehman Prom FNP,  September 04, 2010 2:22 PM  Follow-up for Phone Call        no answer unable to leave messge Michelle Nasuti  September 06, 2010 2:57 PM   Additional Follow-up for Phone Call Additional follow up Details #1::        did you call the daughter's # [phone]) it is listed under the registation? Additional Follow-up by: Lehman Prom FNP,  September 06, 2010 5:09 PM    Additional Follow-up for Phone Call Additional follow up Details #2::    pts daughter aware Follow-up by: Michelle Nasuti,  September 11, 2010 10:09 AM

## 2011-01-23 NOTE — Procedures (Signed)
Summary: Colonoscopy  Patient Name: [name] [name], [name] [name] MRN: [id number] Procedure Procedures: Panendoscopy (EGD) CPT: 43235.    with esophageal dilation. CPT: G9296129.  Personnel: Endoscopist: [name]. Marina Goodell, MD.  Exam Location: Exam performed in Endoscopy Suite.  Patient Consent: Procedure, Alternatives, Risks and Benefits discussed, consent obtained,  Indications  Therapeutics: Reason for exam: Esophageal dilation.  Symptoms: Dysphagia.  History  Current Medications: Patient is not currently taking Coumadin.  Pre-Exam Physical: Performed Nov 27, 2005  Cardio-pulmonary exam, Abdominal exam, Mental status exam WNL.  Exam Exam Info: Maximum depth of insertion Duodenum, intended Duodenum. Patient position: on left side. Vocal cords visualized. Gastric retroflexion performed. Images taken. ASA Classification: III. Tolerance: excellent.  Sedation Meds: Demerol 75 mg. given IV. Versed 6 mg. given IV.  Monitoring: BP and pulse monitoring done. Oximetry used. Supplemental O2 given  Fluoroscopy: Fluoroscopy was used.  Findings HIATAL HERNIA:  STRICTURE / STENOSIS: Stricture in Distal Esophagus.  Constriction: partial. Etiology: benign due to reflux. 32 cm from mouth. Lumen diameter is 14 mm. ICD9: Esophageal Stricture: 530.3.  - Dilation: Distal Esophagus. Procedure was performed under Fluoroscopy. Wire Guided/Savary (Wilson-Cook) dilator used, Diameter: 17,18 mm, No Resistance, No Heme present on extraction. 2  total dilators used. Patient tolerance excellent.    Comments: Otherwise normal EGD to D3 Assessment  Diagnoses: 530.3: Esophageal Stricture.  530.81: GERD.   Events  Unplanned Intervention: No unplanned interventions were required.  Unplanned Events: There were no complications. Plans Instructions: Nothing to eat or drink for 2 hrs.  Clear or full liquids: 2 hrs. Resume previous diet: am.  Medication(s): Continue current  medications.  Disposition: After procedure patient sent to recovery. After recovery patient sent home.  Scheduling: Follow-up prn.   This report was created from the original endoscopy report, which was reviewed and signed by the above listed endoscopist.   cc:  Romero Belling, MD      Marcelino Freestone, MD      The Patient

## 2011-01-23 NOTE — Assessment & Plan Note (Signed)
Summary: Depression   Vital Signs:  Patient profile:   70 year old female Temp:     97.6 degrees F oral Pulse rate:   105 / minute Pulse rhythm:   regular Resp:     20 per minute BP sitting:   130 / 86  (left arm) Cuff size:   regular  Vitals Entered By: Levon Hedger (November 20, 2010 4:32 PM) CC: follow-up visit...feeling weak...feeling agrivated when she can't finish a task.. Is Patient Diabetic? No Pain Assessment Patient in pain? no       Does patient need assistance? Functional Status Self care Ambulation Normal   Primary Care Provider:  Lehman Prom FNP  CC:  follow-up visit...feeling weak...feeling agrivated when she can't finish a task...  History of Present Illness:  Pt into the office for f/u. Since her last visit here she has transitioned back home. She was in [place]. She had OT arranged when she first got out of the hospital but those services have completed and she has signed off. Pt is still disappointed that she is not at her level of activity. She is able to cook a little but for the most part of the day she sits and does puzzles. Pt is not driving.  She was transported here today by a friend.  Multiple Sclerosis - pt has an appt in December but her insurance is no longer paying for medications.  She has not contacted neurology about the need to get medications refilled.  Lif[employer] - pt did have one but she had it attached to a wheelchair while she showered and then her wheelchair was replaced and she was not able to get the alert necklace off. She needs a replacement but it will cost $50  Allergies (verified): No Known Drug Allergies  Review of Systems General:  Denies fever. CV:  Denies chest pain or discomfort and swelling of feet. Resp:  Denies cough. GI:  Denies abdominal pain, nausea, and vomiting. MS:  Complains of muscle.  Physical Exam  General:  alert.   Head:  normocephalic.  thin hair Lungs:  normal breath sounds.     Heart:  normal rate and regular rhythm.   Msk:  falls precautions  Extremities:  1+ left pedal edema and 1+ right pedal edema.   Neurologic:  sliding left leg - using walker   Impression & Recommendations:  Problem # 1:  DEPRESSION (ICD-311) pt did not bring meds today to visit will need to confirm if she is taking cymbalta pt to call back with meds Her updated medication list for this problem includes:    Amitriptyline Hcl 50 Mg Tabs (Amitriptyline hcl) .Marland Kitchen... Take 2 tabs po at bedtime([facility])    Cymbalta 60 Mg Cpep (Duloxetine hcl) ..... One tablet by mouth daily  Orders: T-TSH ([id number])  Problem # 2:  RENAL FAILURE, ACUTE (ICD-584.9) will monitor labs  Problem # 3:  OSTEOPENIA (ICD-733.90) need to check and see if pt is taking meds Her updated medication list for this problem includes:    Calcium-vitamin D 600-200 Mg-unit Tabs (Calcium-vitamin d) ..... One tablet by mouth two times a day    Alendronate Sodium 35 Mg Tabs (Alendronate sodium) ..... One tablet by mouth weekly for bones  Problem # 4:  PERIPHERAL VASCULAR DISEASE (ICD-443.9) ongoing for pt  Complete Medication List: 1)  Amitriptyline Hcl 50 Mg Tabs (Amitriptyline hcl) .... Take 2 tabs po at bedtime([facility]) 2)  Neurontin 300 Mg Caps (Gabapentin) .... One capsule by mouth  three times a day 3)  Cvs Iron 325 (65 Fe) Mg Tabs (Ferrous sulfate) .... One tablet by mouth two times a day 4)  Omeprazole 20 Mg Cpdr (Omeprazole) .... One by mouth two times a day(dr.perry) 5)  Oxybutynin Chloride 5 Mg Tabs (Oxybutynin chloride) .... Take 1 tablet by mouth every 12 hours(gna). 6)  Betaseron 0.3 Mg Solr (Interferon beta-1b) .... Inject every other day(martin at gna) 7)  Acetaminophen 500 Mg Caps (Acetaminophen) .Marland Kitchen.. 1-2 tablets by mouth every 6 hours as needed for pain 8)  Calcium-vitamin D 600-200 Mg-unit Tabs (Calcium-vitamin d) .... One tablet by mouth two times a day 9)  Alendronate Sodium 35  Mg Tabs (Alendronate sodium) .... One tablet by mouth weekly for bones 10)  Zofran 4 Mg Tabs (Ondansetron hcl) .... Take one tablet every 8 hours as needed for nausea. 11)  Metoprolol Succinate 25 Mg Xr24h-tab (Metoprolol succinate) .... One tablet by mouth twice daily 12)  Ambien 5 Mg Tabs (Zolpidem tartrate) .... One tablet by mouth nightly as needed for sleep 13)  Cymbalta 60 Mg Cpep (Duloxetine hcl) .... One tablet by mouth daily 14)  Colace 100 Mg Caps (Docusate sodium) .... One capsule by mouth four times per day 15)  Proventil Hfa 108 (90 Base) Mcg/act Aers (Albuterol sulfate) .... 2 puffs every 4 hours as needed for shortness fo breath 16)  Dulcolax 5 Mg Tbec (Bisacodyl) .... 3 tablets by mouth daily as needed  Other Orders: T- * Misc. Laboratory test [phone])  Patient Instructions: 1)  Have your daughter call the neurologist and tell them that your insurance is no longer covering your MS medications.  They may need to complete paperwork in order for your insurance to cover. 2)  [facility] will be very nice for you to start. 3)  Call this office back tomorrow and speak directly with Aggie Cosier and list out all the medications you are taking. 4)  Your labs will be checked today and you will be notified of the results.   Orders Added: 1)  Est. Patient Level IV [60454] 2)  T-TSH [[id number]] 3)  T- * Misc. Laboratory test [phone]

## 2011-01-23 NOTE — Procedures (Signed)
Summary: EGD   EGD  Procedure date:  03/16/2002  Findings:      Location: [facility]  Findings: Esophagitis  Findings: Stricture:  GERD  Hiatal Hernia  EGD  Procedure date:  03/16/2002  Findings:      Location: [facility]  Findings: Esophagitis  Findings: Stricture:  GERD  Hiatal Hernia Patient Name: [name] [name], [name] [name] MRN: [id number] Procedure Procedures: Panendoscopy (EGD) CPT: 43235.  Personnel: Endoscopist: [name]. Marina Goodell, MD.  Exam Location: Exam performed in Outpatient Clinic. Outpatient  Patient Consent: Procedure, Alternatives, Risks and Benefits discussed, consent obtained, from patient. Consent was obtained by the RN.  Indications  Evaluation of: Anemia,  Microcytic.  History  Pre-Exam Physical: Performed Mar 16, 2002  Entire physical exam was normal.  Exam Exam Info: Maximum depth of insertion Duodenum, intended Duodenum. Patient position: on left side. Vocal cords visualized. Gastric retroflexion performed. Images taken. ASA Classification: III. Tolerance: excellent.  Sedation Meds: Patient assessed and found to be appropriate for moderate (conscious) sedation. Residual sedation present from prior procedure today. Versed 2 mg. given IV.  Monitoring: BP and pulse monitoring done. Oximetry used. Supplemental O2 given  Findings - ESOPHAGEAL INFLAMMATION: suspected as a result of reflux. Severity is severe, ulcerations present.  Proximal margin 27 cm from mouth,  distal margin 35 cm. Length of inflammation: 8 cm. Edema present. [place] Classification: Grade D. ICD9: Esophagitis, Reflux: 530. 11.  STRICTURE / STENOSIS: Stricture in Distal Esophagus.  Constriction: partial. Etiology: benign due to reflux. 35 cm from mouth. ICD9: Esophageal Stricture: 530.3.  HIATAL HERNIA: ICD9: Hernia, Hiatal: 553.3. - OTHER FINDING: 8cm submucosal mass in Body.   Assessment Abnormal examination, see findings above.   Diagnoses: 530.11: Esophagitis, Reflux.  530.3: Esophageal Stricture.  553.3: Hernia, Hiatal.  530.81: GERD.   Comments: Iron deficiency anemia due to ulcerative esophagitis Events  Unplanned Intervention: No unplanned interventions were required.  Unplanned Events: There were no complications. Plans Comments: 1. Prevacid 30mg  BID daily 2. Iron 324mg  TID  Disposition: After procedure patient sent to recovery. After recovery patient sent home.  Scheduling: Office Visit, to [employer]. Marina Goodell, MD, in 4 weeks  Call office for appointment, with primary care physician for wellness exam and general care   Comments: Contrast CT abdomen "submucosal gastric mass, evaluate"  This report was created from the original endoscopy report, which was reviewed and signed by the above listed endoscopist.   cc:  Marcelino Freestone, MD      Romero Belling, MD

## 2011-01-23 NOTE — Letter (Signed)
Summary: RECEIVED RECORDS FROM  [facility]  RECEIVED RECORDS FROM  [facility]   Imported By: Arta Bruce 08/18/2010 11:26:30  _____________________________________________________________________  External Attachment:    Type:   Image     Comment:   External Document

## 2011-01-23 NOTE — Assessment & Plan Note (Signed)
Summary: F/U Hospital   Vital Signs:  Patient profile:   70 year old female O2 Sat:      98 % on Room air Temp:     97.3 degrees F oral Pulse rate:   114 / minute Pulse rhythm:   regular Resp:     20 per minute BP sitting:   130 / 98  (left arm) Cuff size:   regular  Vitals Entered By: Levon Hedger (August 15, 2010 9:48 AM)  O2 Flow:  Room air CC: follow-up visit [facility] (pneumonia)...pt states she just came from rehab and is very tearful today.                Is Patient Diabetic? No Pain Assessment Patient in pain? no       Does patient need assistance? Ambulation Wheelchair   Primary Care Provider:  Lehman Prom FNP  CC:  follow-up visit [facility] (pneumonia)...pt states she just came from rehab and is very tearful today.               Marland Kitchen  History of Present Illness:  Pt into the office for hospital f/u. Hospitalized from 07/12/2010 to 08/01/2010  1.  Left lower lobe hospital - acquired pneumonia.empyema status post video assisted thoracic surgery (VATS):  D/C after completion of vancomycin/zosyn followed by imipenem.  Pt had VATS procedure to treat her left lower lobe hospital-acquired pneumonia/empyema.   F/u on August 25th by Dr. Tyrone Sage - Chest x-ray at 9:45AM followed by 10:15AM appt.  He will evaluate her recovery following a video assisted thoracostomy.    2.  Nonoliguric acute renal failure - Following the VATS procedure, urine output improved upon discharge.  Crt peaked at 4.4.  Renal injury felt due to hypotension during surgery, IV contrast dye and NSAIDS.  Crt down to 1.57  3.  Right sacral decubitus ulcer - Stage III on admission.  Wound care during hospitalization of which is still being done at rehab  4.  Chronic constipation - long history.  Pt given stool softner, stimulant, ememas and manual disimpaction during hospital admission.  Abdominal x-ray done F/U with Dr. Marina Goodell on Tuesday September 20, 1:45AM  5.  Depression - Continue home amitriptyline and  started celexa as her mood was decreased.  Will need to evaluate for increasing dose  6.  Multiple Sclerosis - Taking Betaseron every other day during admission.  Neurologist contacted and medications was to be restarted on discharge.  7.  GERD  8.  Iron deficiency anemia  9.  History of left shoulder distal dislocation/ Hill-Sachs fracture humeral head - essentially homebound after the fracture/dislocation of humeral head with PT in the home.  With subsequent development of pneumonia and long hospitalization pt was transfered to rehab.  PT/OT during hospital admission.  Pt is currently at [facility]. Pt is very tearful in that she wants to return home.    Allergies: No Known Drug Allergies  Review of Systems General:  Denies fever. CV:  Denies chest pain or discomfort. Resp:  Complains of cough. GI:  Complains of constipation. MS:  Complains of muscle weakness; left side.  Physical Exam  General:  alert.   Head:  normocephalic.   Lungs:  few scattered rhonchi Heart:  normal rate and regular rhythm.   Msk:  wheelchair left sided weakness - only passive ROM to left lower extremity Neurologic:  alert & oriented X3.   Psych:  flat affect, tearful   Impression & Recommendations:  Problem # 1:  Hx of PNEUMONIA (ICD-486)  Orders: Pulse Oximetry (single measurment) (16109)  Problem # 2:  RENAL FAILURE, ACUTE (ICD-584.9)  Orders: T-Comprehensive Metabolic Panel ([id number]) T-CBC w/Diff ([id number])  Problem # 3:  OTHER CLOSED FRACTURES OF UPPER END OF HUMERUS (ICD-812.09) still in rehab   Problem # 4:  MULTIPLE SCLEROSIS (ICD-340) pt has ongoing f/u with neurology  Complete Medication List: 1)  Amitriptyline Hcl 50 Mg Tabs (Amitriptyline hcl) .... Take 2 tabs po at bedtime([facility]) 2)  Neurontin 300 Mg Caps (Gabapentin) .... One capsule by mouth three times a day 3)  Cvs Iron 325 (65 Fe) Mg Tabs (Ferrous sulfate) .... One  tablet by mouth two times a day 4)  Omeprazole 20 Mg Cpdr (Omeprazole) .... One by mouth two times a day(dr.perry) 5)  Oxybutynin Chloride 5 Mg Tabs (Oxybutynin chloride) .... Take 1 tablet by mouth every 12 hours(gna). 6)  Betaseron 0.3 Mg Solr (Interferon beta-1b) .... Inject every other day(martin at gna) 7)  Acetaminophen 500 Mg Caps (Acetaminophen) .Marland Kitchen.. 1-2 tablets by mouth every 6 hours as needed for pain 8)  Calcium-vitamin D 600-200 Mg-unit Tabs (Calcium-vitamin d) .... One tablet by mouth two times a day 9)  Alendronate Sodium 35 Mg Tabs (Alendronate sodium) .... One tablet by mouth weekly for bones 10)  Zofran 4 Mg Tabs (Ondansetron hcl) .... Take one tablet every 8 hours as needed for nausea. 11)  Metoprolol Succinate 25 Mg Xr24h-tab (Metoprolol succinate) .... One tablet by mouth twice daily 12)  Ambien 5 Mg Tabs (Zolpidem tartrate) .... One tablet by mouth nightly as needed for sleep 13)  Cymbalta 60 Mg Cpep (Duloxetine hcl) .... One tablet by mouth daily 14)  Mucinex 600 Mg Xr12h-tab (Guaifenesin) .... One tablet by mouth two times a day as needed for cough/congestion 15)  Colace 100 Mg Caps (Docusate sodium) .... One capsule by mouth four times per day 16)  Proventil Hfa 108 (90 Base) Mcg/act Aers (Albuterol sulfate) .... 2 puffs every 4 hours as needed for shortness fo breath 17)  Dulcolax 5 Mg Tbec (Bisacodyl) .... 3 tablets by mouth daily as needed 18)  Betaseron 0.3 Mg Solr (Interferon beta-1b) .... One injection subcutaneously every other day  Patient Instructions: 1)  Labs will be checked today - daughter will be notified of the results 2)  Keep your appointments this week with Dr. Tyrone Sage 3)  Follow up in this office in 2 weeks with n.martin, fnp. 4)  If mood still down will increase dose of celexa. Prescriptions: MUCINEX 600 MG XR12H-TAB (GUAIFENESIN) One tablet by mouth two times a day as needed for cough/congestion  #20 x 0   Entered and Authorized by:   Lehman Prom FNP   Signed by:   Lehman Prom FNP on 08/15/2010   Method used:   Print then Give to Patient   RxID:   [id number] MUCINEX 600 MG XR12H-TAB (GUAIFENESIN) One tablet by mouth two times a day as needed for cough/congestion  #20 x 0   Entered and Authorized by:   Lehman Prom FNP   Signed by:   Lehman Prom FNP on 08/15/2010   Method used:   Print then Give to Patient   RxID:   [id number]    X-ray  Procedure date:  08/02/2010  Findings:      abdominal x-ray - showed moderate stool in the colon.  no findings for obstruction or perforation  Renal US  Procedure date:  07/21/2010  Findings:  Slightly limited to body habitus but showed no abnormalities  Bronchoscopy  Procedure date:  07/19/2010  Findings:      performed by Dr. Tyrone Sage chest tube remained in place until july 31st  CT of Chest  Procedure date:  07/18/2010  Findings:      Small amount of dependent fluid and atelectasis on the right with a larger amount of pleural fluid on the left which was loculated in an irregular fashion.  This raised the possibility of empyema   X-ray  Procedure date:  08/02/2010  Findings:      abdominal x-ray - showed moderate stool in the colon.  no findings for obstruction or perforation  Renal US  Procedure date:  07/21/2010  Findings:      Slightly limited to body habitus but showed no abnormalities  Bronchoscopy  Procedure date:  07/19/2010  Findings:      performed by Dr. Tyrone Sage chest tube remained in place until july 31st  CT of Chest  Procedure date:  07/18/2010  Findings:      Small amount of dependent fluid and atelectasis on the right with a larger amount of pleural fluid on the left which was loculated in an irregular fashion.  This raised the possibility of empyema

## 2011-01-24 ENCOUNTER — Inpatient Hospital Stay (HOSPITAL_COMMUNITY): Payer: Medicare (Managed Care)

## 2011-01-24 ENCOUNTER — Inpatient Hospital Stay (HOSPITAL_COMMUNITY): Admit: 2011-01-24 | Payer: Self-pay | Source: Ambulatory Visit | Admitting: Family Medicine

## 2011-01-24 ENCOUNTER — Inpatient Hospital Stay (HOSPITAL_COMMUNITY)
Admission: EM | Admit: 2011-01-24 | Discharge: 2011-01-24 | DRG: 493 | Disposition: A | Discharge status: Home Health | Payer: Medicare (Managed Care) | Attending: Specialist | Admitting: Specialist

## 2011-01-24 DIAGNOSIS — Y92009 Unspecified place in unspecified non-institutional (private) residence as the place of occurrence of the external cause: Secondary | ICD-10-CM

## 2011-01-24 DIAGNOSIS — Y9389 Activity, other specified: Secondary | ICD-10-CM

## 2011-01-24 DIAGNOSIS — W010XXA Fall on same level from slipping, tripping and stumbling without subsequent striking against object, initial encounter: Secondary | ICD-10-CM | POA: Diagnosis present

## 2011-01-24 DIAGNOSIS — F329 Major depressive disorder, single episode, unspecified: Secondary | ICD-10-CM | POA: Diagnosis present

## 2011-01-24 DIAGNOSIS — K219 Gastro-esophageal reflux disease without esophagitis: Secondary | ICD-10-CM | POA: Diagnosis present

## 2011-01-24 DIAGNOSIS — K5900 Constipation, unspecified: Secondary | ICD-10-CM | POA: Diagnosis present

## 2011-01-24 DIAGNOSIS — K5730 Diverticulosis of large intestine without perforation or abscess without bleeding: Secondary | ICD-10-CM | POA: Diagnosis present

## 2011-01-24 DIAGNOSIS — K449 Diaphragmatic hernia without obstruction or gangrene: Secondary | ICD-10-CM | POA: Diagnosis present

## 2011-01-24 DIAGNOSIS — S42309A Unspecified fracture of shaft of humerus, unspecified arm, initial encounter for closed fracture: Secondary | ICD-10-CM | POA: Diagnosis present

## 2011-01-24 DIAGNOSIS — Z8701 Personal history of pneumonia (recurrent): Secondary | ICD-10-CM

## 2011-01-24 DIAGNOSIS — N390 Urinary tract infection, site not specified: Secondary | ICD-10-CM | POA: Diagnosis present

## 2011-01-24 DIAGNOSIS — Z79899 Other long term (current) drug therapy: Secondary | ICD-10-CM

## 2011-01-24 DIAGNOSIS — F3289 Other specified depressive episodes: Secondary | ICD-10-CM | POA: Diagnosis present

## 2011-01-24 DIAGNOSIS — R32 Unspecified urinary incontinence: Secondary | ICD-10-CM | POA: Diagnosis present

## 2011-01-24 DIAGNOSIS — E669 Obesity, unspecified: Secondary | ICD-10-CM | POA: Diagnosis present

## 2011-01-24 DIAGNOSIS — G35 Multiple sclerosis: Secondary | ICD-10-CM | POA: Diagnosis present

## 2011-01-24 DIAGNOSIS — Y999 Unspecified external cause status: Secondary | ICD-10-CM

## 2011-01-24 HISTORY — PX: ORIF HUMERAL SHAFT FRACTURE: SUR937

## 2011-01-25 LAB — CBC
HCT: 35.1 % — ABNORMAL LOW (ref 36.0–46.0)
Hemoglobin: 11 g/dL — ABNORMAL LOW (ref 12.0–15.0)
MCV: 80 fL (ref 78.0–100.0)
RBC: 4.39 MIL/uL (ref 3.87–5.11)
WBC: 15.6 10*3/uL — ABNORMAL HIGH (ref 4.0–10.5)

## 2011-01-25 LAB — BASIC METABOLIC PANEL
Chloride: 103 mEq/L (ref 96–112)
GFR calc Af Amer: >60 mL/min (ref >60)
Potassium: 5.2 mEq/L — ABNORMAL HIGH (ref 3.5–5.1)

## 2011-01-25 NOTE — Letter (Signed)
Summary: [facility]  [facility]   Imported By: Arta Bruce 12/20/2010 15:51:41  _____________________________________________________________________  External Attachment:    Type:   Image     Comment:   External Document

## 2011-01-25 NOTE — Letter (Signed)
Summary: REQUEST FOR PERSONAL CARE SERVICES/NEW REFERRAL//FAXED  REQUEST FOR PERSONAL CARE SERVICES/NEW REFERRAL//FAXED   Imported By: Arta Bruce 12/05/2010 14:14:28  _____________________________________________________________________  External Attachment:    Type:   Image     Comment:   External Document

## 2011-01-25 NOTE — Letter (Signed)
Summary: NEUROIMAGING REPORT  NEUROIMAGING REPORT   Imported By: Arta Bruce 12/27/2010 11:47:54  _____________________________________________________________________  External Attachment:    Type:   Image     Comment:   External Document

## 2011-01-25 NOTE — Letter (Signed)
Summary: [facility]  [facility]   Imported By: Lester Addington 12/22/2010 07:52:47  _____________________________________________________________________  External Attachment:    Type:   Image     Comment:   External Document

## 2011-01-25 NOTE — Miscellaneous (Signed)
Summary: MRI results  Clinical Lists Changes  Problems: Changed problem from MULTIPLE SCLEROSIS (ICD-340) to MULTIPLE SCLEROSIS (ICD-340) - 12/20/2010 - MRI head - multiple chronic demyelinating plaques, in comparison to the prior MRI from 10/20/2009, several additional chronic demyelinating plaques are seen in the left frontal and right middle cerebellar peduncle regions.

## 2011-01-26 LAB — URINE CULTURE
Colony Count: NO GROWTH
Culture  Setup Time: 201202021127
Culture: NO GROWTH
Special Requests: NEGATIVE

## 2011-01-26 LAB — CBC
HCT: 28.1 % — ABNORMAL LOW (ref 36.0–46.0)
MCH: 24.6 pg — ABNORMAL LOW (ref 26.0–34.0)
MCV: 79.4 fL (ref 78.0–100.0)
Platelets: 296 10*3/uL (ref 150–400)
RBC: 3.54 MIL/uL — ABNORMAL LOW (ref 3.87–5.11)
RDW: 15.2 % (ref 11.5–15.5)
WBC: 11.2 10*3/uL — ABNORMAL HIGH (ref 4.0–10.5)

## 2011-01-26 LAB — BASIC METABOLIC PANEL
CO2: 25 mEq/L (ref 19–32)
Calcium: 9.3 mg/dL (ref 8.4–10.5)
Creatinine, Ser: 0.92 mg/dL (ref 0.4–1.2)
GFR calc Af Amer: >60 mL/min (ref >60)
GFR calc non Af Amer: >60 mL/min (ref >60)
Sodium: 137 mEq/L (ref 135–145)

## 2011-01-27 NOTE — Op Note (Addendum)
[name] [name], [name] [name]               ACCOUNT NO.:  [id number]  MEDICAL RECORD NO.:  [id number]           PATIENT TYPE:  O  LOCATION:  XRAY                         FACILITY:  [facility]  PHYSICIAN:  Eulas Post, MD    DATE OF BIRTH:  11-Dec-1941  DATE OF PROCEDURE:  01/24/2011 DATE OF DISCHARGE:                              OPERATIVE REPORT   PREOPERATIVE DIAGNOSIS:  Left distal third humerus fracture.  POSTOPERATIVE DIAGNOSIS:  Left distal third humerus fracture.  OPERATIVE PROCEDURE:  Open reduction and internal fixation of left distal third humerus fracture.  ANESTHESIA:  General.  ESTIMATED BLOOD LOSS:  300 mL.  ATTENDING SURGEON:  Eulas Post, MD  FIRST ASSISTANT:  Dorthula Matas, PA  OPERATIVE IMPLANTS:  Synthes small fragment locking plate with a total of 4 bicortical proximal nonlocking screws and five distal nonlocking screws, most of which were interfragmentary screws with a single interfragmentary lag screw outside of the plate.  No locking screws were utilized.  PREOPERATIVE INDICATIONS:  Ms. [name] [name] is a 70 year old woman who had a left distal third humeral shaft fracture.  She had intact preoperative radial nerve function.  She was initially seen by Dr. Shelle Iron and then he requested my assistance in her management.  I then saw her on January 24, 2011, and inpatient consultation was performed.  Please see the hospital chart for details.  She had a distal third spiral humerus fracture and elected to undergo surgical intervention.  I did discuss the options of nonoperative management, however, given her already limited functional status with her multiple sclerosis, she did not want to proceed with a prolonged course of immobilization, and therefore, elects for surgical intervention.  The risks, benefits, and alternatives were discussed before the procedure including but not limited to the risks of infection, bleeding, nerve injury, specifically to the  radial nerve, malunion, nonunion, hardware prominence, hardware failure, the need for hardware removal, periprosthetic fracture, cardiopulmonary complications, among others, and she is willing to proceed.  OPERATIVE PROCEDURE:  The patient was brought to the operating room, placed in supine position.  IV antibiotics were given.  General anesthesia was administered.  She was placed in the lateral decubitus position.  Axillary roll was placed.  All bony prominences were padded. The left upper extremity was prepped and draped in usual sterile fashion.  This was draped basically over a radiolucent SureFoot holder. The posterior approach to the distal humerus was performed using a triceps splitting technique.  Radial nerve was identified and retracted radially and protected throughout the case.  The fracture is identified and cleaned and reduced and held anatomically with a clamp and then I placed a front to back K-wire to give provisional fixation.  I then placed a ulnar to radial sided lag screw in order to get some betterfixation.  Once this was in place, I confirmed position of the reduction clinically and radiographically using live C-arm and then I selected the plate.  Initially, I had planned to use a lateral extraarticular Synthes plate, however, after placing the plate on the bone, it appeared that there was going to be a significant  amount of contour that I would need to do in order to get apposition of the plate directly to the bone, given that she did have some degree of bowing.  I also examined a regular small fragment locking plate which provided better contour to the bone and easier application.  I did bend the plate using plate benders in order to get excellent apposition all the way across the bone.  Once I had contoured the plate appropriately, I applied the plate and secured it proximally and distally with nonlocking screws.  The proximal segment extended fairly distal on  the anterior surface, however, I had excellent bony apposition all throughout the fracture site, and therefore, I did not extend the plate further down the humerus onto the lateral condyle, and rather elected to use multiple interfragmentary screws through the plate.  I actually had a total of 5 screws distal to the fracture site that were basically interfragmentary screws.  The distal most screw was in both cortices of the distal segment, although the next four screws above that were interfragmentary screws.  There was also another lag screw.  Overall, this is a very stable construct, and due to the potential for plate prominence with the longer plate and the additional stress risers distal, I elected not to use the longer plate.  I had excellent fixation, and I secured it above and below.  The radial nerve was identified and protected and retracted throughout the case using blunt retractors, non-Hohmann-type retractors in order to minimize stretch on the nerve.  Once all the fixation had been achieved, I took final C-arm pictures and irrigated the wounds copiously and then repaired the triceps fascia with #1 Vicryl followed by 2-0 for the subcutaneous tissue, 4-0 Monocryl, Steri-Strips, and sterile gauze to the skin and then the posterior splint.  The patient was awakened and returned to PACU in stable and satisfactory condition. Skip Mayer, PA-C, was present and scrubbed during critical portions of the case and was essential for reduction as well as positioning, retraction, and closure.     Eulas Post, MD     JPL/MEDQ  D:  01/24/2011  T:  01/25/2011  Job:  [id number]  Electronically Signed by Teryl Lucy MD on 02/26/2011 08:29:40 AM

## 2011-01-29 ENCOUNTER — Ambulatory Visit
Admission: RE | Admit: 2011-01-29 | Discharge: 2011-01-29 | Disposition: A | Discharge status: Home / Self Care | Payer: No Typology Code available for payment source | Source: Ambulatory Visit | Attending: Family Medicine | Admitting: Family Medicine

## 2011-01-29 ENCOUNTER — Other Ambulatory Visit: Payer: Self-pay | Admitting: Family Medicine

## 2011-01-29 ENCOUNTER — Ambulatory Visit
Admission: RE | Admit: 2011-01-29 | Discharge: 2011-01-29 | Disposition: A | Discharge status: Home / Self Care | Payer: Medicare (Managed Care) | Source: Ambulatory Visit | Attending: Family Medicine | Admitting: Family Medicine

## 2011-01-29 DIAGNOSIS — R05 Cough: Secondary | ICD-10-CM

## 2011-01-29 NOTE — Consult Note (Signed)
  [name] [name], [name] [name]               ACCOUNT NO.:  [id number]  MEDICAL RECORD NO.:  [id number]          PATIENT TYPE:  INP  LOCATION:  0104                         FACILITY:  [facility]  PHYSICIAN:  Jene Every, M.D.    DATE OF BIRTH:  August 22, 1941  DATE OF CONSULTATION:  01/23/2011 DATE OF DISCHARGE:                                CONSULTATION   CHIEF COMPLAINT:  Left humerus fracture.  HISTORY:  This is a 70 year old female with history of MS who fell this evening, tripped at her home, lives alone, on to her left arm, acute pain deformity, brought by EMS here to the hospital.  No loss of consciousness.  No fevers, chills, pain that wakes her up at night.  No numbness, tingling, neck pain.  No back pain.  PAST MEDICAL HISTORY:  MS.  MEDICATIONS:  Neurontin, amitriptyline, MiraLax, Aleve, Tylenol.  ALLERGIES:  None.  SOCIAL HISTORY:  Tobacco negative.  Beverages negative.  FAMILY HISTORY:  Negative.  PHYSICAL EXAMINATION:  GENERAL:  Alert, oriented, moderate stress. EXTREMITIES:  Inspection of the left upper extremity, deformities noted of the left arm.  Compartments are soft.  Skin is intact.  Good pulses distally radius and ulna.  Neurovascularly intact. SHOULDER AND NECK EXAM:  Unremarkable as is the elbow, forearm, and wrist. ABDOMEN:  Soft, nontender. COR:  Regular rate and rhythm. PULMONARY:  Clear to auscultation. PELVIS:  Stable.  RADIOGRAPHS:  Demonstrate a spiral fracture of the distal humerus extraarticular.  IMPRESSION:  Closed spiral fracture of the distal humerus.  PLAN: 1. Proceed with closed reduction which was performed utilizing a     clamshell-type splint.  The patient was not tolerating flexion of     the elbow and was therefore splinted in situ with a reduction     maneuver.  Post-splinting x-ray was pending.  She had good pulses     following the splinting. 2. She will require open reduction and internal fixation.  We     discussed this with Dr.  Dion Saucier who will see her     tomorrow, assume care and perform open reduction and internal     fixation tomorrow afternoon.  I discussed the risks and benefits     with the patient including bleeding, infection, nonunion, malunion,     need for revision, damage to neurovascular vascular, etc.  She was     admitted by hospitalist with history of MS.  She lives alone.     Jene Every, M.D.     [name]  D:  01/23/2011  T:  01/24/2011  Job:  [id number]  Electronically Signed by Jene Every M.D. on 01/29/2011 02:19:27 PM

## 2011-02-14 ENCOUNTER — Other Ambulatory Visit (HOSPITAL_COMMUNITY): Payer: Self-pay | Admitting: Family Medicine

## 2011-02-14 DIAGNOSIS — Z1231 Encounter for screening mammogram for malignant neoplasm of breast: Secondary | ICD-10-CM

## 2011-02-14 NOTE — H&P (Signed)
[name] [name], [name] [name]               ACCOUNT NO.:  [id number]  MEDICAL RECORD NO.:  [id number]          PATIENT TYPE:  INP  LOCATION:  0104                         FACILITY:  [facility]  PHYSICIAN:  Massie Maroon, MD        DATE OF BIRTH:  12/13/1941  DATE OF ADMISSION:  01/23/2011 DATE OF DISCHARGE:                             HISTORY & PHYSICAL   CHIEF COMPLAINT:  "I fell."  HISTORY OF PRESENT ILLNESS:  A 70 year old female with a history of multiple sclerosis, apparently went into her bedroom and tripped and fell and wrapped herself around her walker and because of pain came to the ER.  She was found to have a spiral fracture of her humerus. Orthopedics has been notified and apparently Dr. Teryl Lucy will come by to evaluate the patient and take her to the OR on January 24, 2011, in the afternoon or evening.  Dr. Shelle Iron was kind enough to consult and evaluate the patient initially and wrap her arm.  The patient denies any fever, chills, syncope, cough, chest pain, palpitations, shortness of breath, nausea, vomiting, diarrhea, bright red blood per rectum or black stool.  We are somewhat handicap by the fact that computer system is down.  The patient will be admitted for left humerus fracture.  PAST MEDICAL HISTORY: 1. Multiple sclerosis. 2. Obesity. 3. Recurrent pneumonia 4. Large left empyema on July 19, 2010, status post bronchoscopy,     status post left video-assisted thoracoscopy, minithoracotomy and     evacuation of empyema with decortication. 5. Gastroesophageal reflux disease. 6. Hiatal hernia. 7. Diverticulosis. 8. Frequency of urination. 9. Acute renal failure. 10.Fatty liver on ultrasound, August 31, 2010.  PAST SURGICAL HISTORY: 1. Cholecystectomy 1971. 2. Video-assisted thoracoscopy and minithoracotomy and evacuation of     empyema with decortication, July 19, 2010.  SOCIAL HISTORY:  The patient does not smoke or drink.  She lives by herself.  Previous to  that, she was living at [facility] temporarily for rehab.  FAMILY HISTORY:  None.  ALLERGIES:  No known drug allergies.  MEDICATIONS: 1. Ampyra 10 mg p.o. b.i.d. 2. Colace 100 mg p.o. q.i.d. 3. Os-Cal plus D 1 p.o. b.i.d. p.r.n. 4. Etodolac 500 mg p.o. b.i.d. p.r.n. 5. Diclofenac 75 mg p.o. daily p.r.n. 6. Gabapentin 300 mg 2 p.o. q.a.m., 1 p.o. every noon, 1 p.o. q.p.m. 7. Elavil 25 mg 2 p.o. q.h.s. 8. Fluoxetine 20 mg p.o. daily. 9. Omeprazole 20 mg p.o. daily. 10.Oxybutynin 5 mg p.o. b.i.d. 11.Vicodin 5/325 mg 1 p.o. q.6 h. p.r.n. pain.  REVIEW OF SYSTEMS:  Negative for syncope, negative for all 10 organ systems, except for pertinent positives stated above.  PHYSICAL EXAM:  VITAL SIGNS:  Temperature 98.1, pulse 93, blood pressure 151/91, pulse ox is 98% on 2 liters nasal cannula. HEENT:  Anicteric. NECK:  No JVD. HEART:  Regular rate and rhythm, S1 and S2.  No murmurs, gallops, or rubs. LUNGS:  Clear to auscultation bilaterally. ABDOMEN:  Soft, nontender, nondistended.  Positive bowel sounds. EXTREMITIES:  No cyanosis, clubbing, or edema. SKIN:  No rashes.  Lymph nodes no adenopathy. NEURO EXAM:  Reflexes 2+, symmetric, diffuse without any clonus, toes are equivocal.  Motor strength 5/5 in all 4 extremities, pinprick intact.  LABS:  Unavailable to me at this time.  X-RAY:  Fracture involving the distal humeral metaphysis with slight dorsal angulation.  Chest x-ray show left basilar scarring or atelectasis, chronic blunting of the left costophrenic angle.  ASSESSMENT/PLAN: 1. Left humerus fracture:  Orthopedic consult for evaluation, Dr.     Shelle Iron and Dion Saucier. 2. Multiple sclerosis:  f/u with Dr. Vickey Huger. 3. Gastroesophageal reflux disease:  The patient is on omeprazole. 4. DVT prophylaxis with SCDs. 5. Disposition:  CBC, CMP, PT, PTT, urinalysis, chest x-ray for preop     clearance along with a 12-lead EKG.  The patient appears to be     comfortable and in no  acute distress.  The patient is at low risk     for surgery at this time, recommend __________ surgery for left     humerus fracture.  Benefits outweigh the risks.     Massie Maroon, MD     JYK/MEDQ  D:  01/24/2011  T:  01/24/2011  Job:  [id number]  cc:   Lynelle Smoke I. Patsi Sears, M.D. Fax: [phone]  Dr. Eduard Clos, M.D. Fax: [phone]  Electronically Signed by Pearson Grippe MD on 02/14/2011 01:20:28 PM

## 2011-03-05 ENCOUNTER — Encounter (HOSPITAL_BASED_OUTPATIENT_CLINIC_OR_DEPARTMENT_OTHER): Payer: Medicare (Managed Care) | Attending: Internal Medicine

## 2011-03-05 DIAGNOSIS — Z79899 Other long term (current) drug therapy: Secondary | ICD-10-CM | POA: Insufficient documentation

## 2011-03-05 DIAGNOSIS — L89309 Pressure ulcer of unspecified buttock, unspecified stage: Secondary | ICD-10-CM | POA: Insufficient documentation

## 2011-03-05 DIAGNOSIS — G35 Multiple sclerosis: Secondary | ICD-10-CM | POA: Insufficient documentation

## 2011-03-05 DIAGNOSIS — K2100 Gastro-esophageal reflux disease with esophagitis, without bleeding: Secondary | ICD-10-CM | POA: Insufficient documentation

## 2011-03-05 DIAGNOSIS — K5900 Constipation, unspecified: Secondary | ICD-10-CM | POA: Insufficient documentation

## 2011-03-05 DIAGNOSIS — L8993 Pressure ulcer of unspecified site, stage 3: Secondary | ICD-10-CM | POA: Insufficient documentation

## 2011-03-05 NOTE — Discharge Summary (Addendum)
[name] [name], [name] [name]               ACCOUNT NO.:  [id number]  MEDICAL RECORD NO.:  [id number]           PATIENT TYPE:  O  LOCATION:  XRAY                         FACILITY:  [facility]  PHYSICIAN:  Jethro Bastos, M.D.DATE OF BIRTH:  November 17, 1941  DATE OF ADMISSION:  01/24/2011 DATE OF DISCHARGE:  01/24/2011                              DISCHARGE SUMMARY   ADMISSION DIAGNOSES: 1. Fracture, left humerus. 2. Multiple sclerosis.  DISCHARGE DIAGNOSES: 1. Spiral fracture, left distal humerus. 2. Multiple sclerosis with left-sided weakness. 3. Chronic constipation. 4. Depression. 5. Urinary incontinence with urinary tract infection. 6. Chronic constipation.  PROCEDURES:  Left humerus internal fixation done on January 24, 2011, with plate and screws to reduce fracture of left humerus.  BRIEF HISTORY AND PHYSICAL:  This 70 year old female with longstanding multiple sclerosis was walking from her bedroom to her living room at home using her walker when she unexplained fell landing on her left side.  She needed to call EMS, taken to [facility] Room.  X- rays confirmed a spiral displaced fracture of the left humeral shaft. She was admitted to the hospital.  Orthopedic consult was obtained by Dr. Shelle Iron.  Surgery was planned.  DIAGNOSTIC STUDIES:  Hemoglobin was 11.0 g, white count 15600, MCV was 80, and platelet count was 360,000.  BMP showed sodium of 134, potassium 5.2, chloride 103, glucose 116, BUN of 9, and creatinine 0.93.  X-ray report showed a fracture involving the distal humeral metaphysis with slight dorsal angulation.  Portable chest x-ray showed left basilar scarring and/or atelectasis and chronic blunting of the left costophrenic angle.  PROCEDURES:  She had a closed reduction of her fracture, left humerus, while in the emergency room on January 23, 2011, by Dr. Shelle Iron.  On January 24, 2011, she had an open reduction and internal fixation done by Dr. Dion Saucier.  She  tolerated both procedures well.  COURSE IN THE HOSPITAL:  The patient presented to the emergency room and was admitted for fracture of left humerus.  Surgery was planned and performed on the evening of 01/24/2011.  A Foley catheter was placed. Over the next 36 hours, her pain was controlled with oral medication. She was able to tolerate oral fluids.  Per patient request, the Foley catheter was left in place until the morning of discharge on 01/26/2011. By the time of discharge, she was feeling significantly improved with pain controlled with oral oxycodone.  The catheter had been removed. Physical therapy and occupational therapy had been performed.  DISCHARGE DIAGNOSIS:  The patient was discharged home with the help of [facility].  Transportation will be provided through the [facility] program.  She will get home nursing aids to help her daughter supervise and care for the patient at home.  The patient needs at least one-person assist to ambulate.  She will wear absorbent undergarments to help with chronic urinary incontinence.  DISCHARGE MEDICATIONS:  Include her prehospital medications of: 1. Ampyra 10 mg b.i.d. 2. MiraLax 17 g 1 dose b.i.d. 3. Amitriptyline 50 mg 2 q.h.s. 4. Gabapentin 1200 mg in divided doses. 5. Omeprazole 20 mg daily. 6.  She is to continue with Septra DS b.i.d. for 14 days. 7. Oxycodone with acetaminophens 5/325 was prescribed, 40 pills     prescribed and she is to take 2 every 4 hours p.r.n. pain.  She is to follow up with the [facility] program throughout this weekend with home visits.  She is to see Dr. Dion Saucier, her orthopedist, in followup in 12 days.  Appointments will be made.  She is to be ambulated as tolerated.  She is on a regular diet.          ______________________________ Jethro Bastos, M.D.     RNK/MEDQ  D:  01/26/2011  T:  01/26/2011  Job:  [id number]  cc:   Eulas Post, MD Fax: [phone]  Electronically Signed by Marny Lowenstein M.D. on 04/25/2011 05:28:16 PM

## 2011-03-05 NOTE — H&P (Addendum)
[name] [name], [name] [name]               ACCOUNT NO.:  [id number]  MEDICAL RECORD NO.:  [id number]           PATIENT TYPE:  O  LOCATION:  XRAY                         FACILITY:  [facility]  PHYSICIAN:  Jethro Bastos, M.D.DATE OF BIRTH:  1941/11/11  DATE OF ADMISSION:  01/24/2011 DATE OF DISCHARGE:                             HISTORY & PHYSICAL   CHIEF COMPLAINT:  Fell and pain in left forearm.  HISTORY OF PRESENT ILLNESS:  This is a 70 year old female who is a patient of [facility].  She has a long history of multiple sclerosis with chronic left-sided weakness.  She has been a [facility] participant December 24, 2010; had been doing fairly well.  She was at her home alone yesterday evening when she was using her walker to walk from the bedroom to the living room.  Somehow, she does not know how, she fell on her left side and fell to the floor.  Her left arm was caught beneath her buttocks.  She pressed her PERS (personal emergency response system) and had ambulance brought to her home.  She was in lot of pain. EMS brought her to [facility].  She was diagnosed with a fracture of the left distal humerus.  Seen in consultation by orthopedist, Dr. Shelle Iron, who diagnosed a fracture and felt that she needed admission and surgery.  She is now admitted to the hospital and surgery is planned this afternoon by Dr. Dion Saucier.  PAST MEDICAL HISTORY:  ALLERGIES:  She has no known drug allergies.  MEDICATIONS:  Her medications are MiraLax 17 g p.o. b.i.d. for chronic constipation, Neurontin 1200 mg a day in divided doses, Ampyra 10 mg t.i.d. for her multiple sclerosis, Prilosec 20 mg daily for GERD, amitriptyline 50 mg 2 q.h.s., Aleve 220 mg 1 t.i.d., and just started 2 days ago on Septra DS 1 b.i.d. for 14 days for UTI.  Medical problems include her long history of multiple sclerosis for up to 35 years.  Has been followed by neurologist.  Up until recently was on Betaseron injections every  other day.  Just this past week was switched to Ampyra 10 mg t.i.d. per neurologist recommendation.  She also was hospitalized last summer for aspiration pneumonia.  She was quite sick at that time and had a VATS procedure to evacuate left-sided empyema.  In May of 2011, she fell and had a dislocation of her left shoulder.  SURGICAL HISTORY:  Cholecystectomy in 1970, tonsillectomy at age 23, bilateral tubal ligation in 1975, esophageal dilatation x3 with the most recent one 2 years ago, left shoulder fracture dislocation with closed reduction in May 08, 2010, VATS with chest tube drainage in July of 2011 for empyema.  She has had other hospitalizations in the past for aspiration pneumonia.  FAMILY HISTORY:  Father died of "old age."  Mother died at age 16 of unknown cause.  Has several siblings and half siblings but does not know anything about them.  She has 1 son and his name is Loistine Chance, one daughter and her name is Kenney Houseman.  SOCIAL HISTORY:  The patient is widowed.  Married once for  48 years but husband died 6 years ago from an MI.  She does not use alcohol or tobacco and has never used drugs.  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  She lives alone in a handicap accessible apartment.  She attends the [facility] 3 days a week and get physical therapy.  She also receives home health care and other personal care assistant at the [facility].  REVIEW OF SYSTEMS:  She has had a little bit of hacky cough this morning.  Felt somewhat nauseated after her fracture yesterday evening but that could have been because of pain.  She currently does have pain in the left arm, but it has been dealt by a recent dose of morphine IV. She has had chronic problems with constipation.  She had 2 suppositories last night and did have a bowel movement last night, but it was only fair result.  She does have problems with urinary incontinence and  urge incontinence.  She has recently seen urologist and evaluation is planned.  OBJECTIVE:  VITAL SIGNS:  I do not have immediate access to vital signs but they have been within normal limits. SKIN:  Warm and dry.  Her left arm is in a splint.  Her left fingers are somewhat cool but she can move them and has sensation and has good capillary refills of her left fingers.  Her TMs were normal.  Pupils equal and reactive to light and accommodation.  EOMI.  Vision intact. Cranial nerves intact. NECK:  Shows no thyromegaly or adenopathy or bruits. LUNGS:  Clear.  She did have a little bit of a cough but no respiratory distress. HEART:  Heart sounds regular without murmur, rub, or gallop. ABDOMEN:  Soft without organomegaly, masses, or tenderness.  She has a healed surgical scar in the right upper quadrant of her abdomen.  She has intermittent compression wraps around both calves.  No significant pedal edema.  Good pedal pulses.  Chest x-ray report shows no acute changes.  Left-sided basilar scarring and atelectasis noted but a lot of this is chronic.  Left elbow x-ray shows fracture involving the distal femoral metaphysis with slight dorsal angulation.  ASSESSMENT:  This is a 70 year old female with acute fracture of left distal humerus secondary to fall yesterday evening.  It is uncertain why she fell but she does have problems with balance and chronic weakness on her left side related to her multiple sclerosis.  She has numerous other medical problems as noted above but has been fairly stable.  PLAN:  I feel she is an acceptable risk for surgery.  The [facility] can provide much intensive outpatient care.  I am hopeful that she will be able to discharge to home soon.  We will continue to follow her in the hospital.          ______________________________ Jethro Bastos, M.D.     RNK/MEDQ  D:  01/24/2011  T:  01/24/2011  Job:  [id number]  Electronically Signed by Marny Lowenstein M.D. on 04/25/2011 05:27:50 PM

## 2011-03-09 LAB — DIFFERENTIAL
Basophils Absolute: 0 10*3/uL (ref 0.0–0.1)
Basophils Absolute: 0.1 10*3/uL (ref 0.0–0.1)
Basophils Absolute: 0.1 10*3/uL (ref 0.0–0.1)
Basophils Relative: 0 % (ref 0–1)
Basophils Relative: 0 % (ref 0–1)
Basophils Relative: 1 % (ref 0–1)
Basophils Relative: 1 % (ref 0–1)
Eosinophils Absolute: 0.3 10*3/uL (ref 0.0–0.7)
Eosinophils Absolute: 0.5 10*3/uL (ref 0.0–0.7)
Eosinophils Absolute: 0.6 10*3/uL (ref 0.0–0.7)
Eosinophils Absolute: 0.7 10*3/uL (ref 0.0–0.7)
Eosinophils Relative: 1 % (ref 0–5)
Eosinophils Relative: 5 % (ref 0–5)
Eosinophils Relative: 5 % (ref 0–5)
Eosinophils Relative: 5 % (ref 0–5)
Lymphocytes Relative: 16 % (ref 12–46)
Lymphocytes Relative: 17 % (ref 12–46)
Lymphs Abs: 2.3 10*3/uL (ref 0.7–4.0)
Lymphs Abs: 2.3 10*3/uL (ref 0.7–4.0)
Lymphs Abs: 2.7 10*3/uL (ref 0.7–4.0)
Monocytes Absolute: 1.4 10*3/uL — ABNORMAL HIGH (ref 0.1–1.0)
Monocytes Absolute: 1.5 10*3/uL — ABNORMAL HIGH (ref 0.1–1.0)
Monocytes Absolute: 1.7 10*3/uL — ABNORMAL HIGH (ref 0.1–1.0)
Monocytes Relative: 7 % (ref 3–12)
Monocytes Relative: 8 % (ref 3–12)
Monocytes Relative: 9 % (ref 3–12)
Neutro Abs: 8.1 10*3/uL — ABNORMAL HIGH (ref 1.7–7.7)
Neutrophils Relative %: 66 % (ref 43–77)
Neutrophils Relative %: 67 % (ref 43–77)
Neutrophils Relative %: 69 % (ref 43–77)
Neutrophils Relative %: 73 % (ref 43–77)

## 2011-03-09 LAB — COMPREHENSIVE METABOLIC PANEL
ALT: 13 U/L (ref 0–35)
AST: 31 U/L (ref 0–37)
Albumin: 2.1 g/dL — ABNORMAL LOW (ref 3.5–5.2)
Alkaline Phosphatase: 143 U/L — ABNORMAL HIGH (ref 39–117)
CO2: 21 mEq/L (ref 19–32)
Chloride: 114 mEq/L — ABNORMAL HIGH (ref 96–112)
GFR calc Af Amer: 32 mL/min — ABNORMAL LOW (ref >60)
Potassium: 3.6 mEq/L (ref 3.5–5.1)
Total Bilirubin: 0.3 mg/dL (ref 0.3–1.2)

## 2011-03-09 LAB — RENAL FUNCTION PANEL
Albumin: 1.5 g/dL — ABNORMAL LOW (ref 3.5–5.2)
BUN: 59 mg/dL — ABNORMAL HIGH (ref 6–23)
BUN: 61 mg/dL — ABNORMAL HIGH (ref 6–23)
BUN: 63 mg/dL — ABNORMAL HIGH (ref 6–23)
CO2: 23 mEq/L (ref 19–32)
CO2: 27 mEq/L (ref 19–32)
Calcium: 7.8 mg/dL — ABNORMAL LOW (ref 8.4–10.5)
Calcium: 7.9 mg/dL — ABNORMAL LOW (ref 8.4–10.5)
Creatinine, Ser: 4.25 mg/dL — ABNORMAL HIGH (ref 0.4–1.2)
Creatinine, Ser: 4.38 mg/dL — ABNORMAL HIGH (ref 0.4–1.2)
Glucose, Bld: 104 mg/dL — ABNORMAL HIGH (ref 70–99)
Glucose, Bld: 108 mg/dL — ABNORMAL HIGH (ref 70–99)
Glucose, Bld: 97 mg/dL (ref 70–99)
Phosphorus: 5.4 mg/dL — ABNORMAL HIGH (ref 2.3–4.6)
Phosphorus: 5.6 mg/dL — ABNORMAL HIGH (ref 2.3–4.6)
Phosphorus: 5.9 mg/dL — ABNORMAL HIGH (ref 2.3–4.6)
Potassium: 4.7 mEq/L (ref 3.5–5.1)
Sodium: 140 mEq/L (ref 135–145)

## 2011-03-09 LAB — CBC
HCT: 25.4 % — ABNORMAL LOW (ref 36.0–46.0)
HCT: 25.6 % — ABNORMAL LOW (ref 36.0–46.0)
HCT: 27.1 % — ABNORMAL LOW (ref 36.0–46.0)
HCT: 28.8 % — ABNORMAL LOW (ref 36.0–46.0)
HCT: 29.7 % — ABNORMAL LOW (ref 36.0–46.0)
Hemoglobin: 8.4 g/dL — ABNORMAL LOW (ref 12.0–15.0)
Hemoglobin: 9 g/dL — ABNORMAL LOW (ref 12.0–15.0)
Hemoglobin: 9.2 g/dL — ABNORMAL LOW (ref 12.0–15.0)
Hemoglobin: 9.3 g/dL — ABNORMAL LOW (ref 12.0–15.0)
Hemoglobin: 9.3 g/dL — ABNORMAL LOW (ref 12.0–15.0)
Hemoglobin: 9.8 g/dL — ABNORMAL LOW (ref 12.0–15.0)
MCH: 24 pg — ABNORMAL LOW (ref 26.0–34.0)
MCH: 24.1 pg — ABNORMAL LOW (ref 26.0–34.0)
MCH: 24.2 pg — ABNORMAL LOW (ref 26.0–34.0)
MCH: 24.4 pg — ABNORMAL LOW (ref 26.0–34.0)
MCH: 24.6 pg — ABNORMAL LOW (ref 26.0–34.0)
MCH: 24.9 pg — ABNORMAL LOW (ref 26.0–34.0)
MCH: 25.2 pg — ABNORMAL LOW (ref 26.0–34.0)
MCH: 25.8 pg — ABNORMAL LOW (ref 26.0–34.0)
MCHC: 30.6 g/dL (ref 30.0–36.0)
MCHC: 30.8 g/dL (ref 30.0–36.0)
MCHC: 30.9 g/dL (ref 30.0–36.0)
MCHC: 31.1 g/dL (ref 30.0–36.0)
MCHC: 31.3 g/dL (ref 30.0–36.0)
MCHC: 31.6 g/dL (ref 30.0–36.0)
MCHC: 33.1 g/dL (ref 30.0–36.0)
MCHC: 33.2 g/dL (ref 30.0–36.0)
MCV: 74.9 fL — ABNORMAL LOW (ref 78.0–100.0)
MCV: 77.5 fL — ABNORMAL LOW (ref 78.0–100.0)
MCV: 78.1 fL (ref 78.0–100.0)
MCV: 78.6 fL (ref 78.0–100.0)
MCV: 78.9 fL (ref 78.0–100.0)
MCV: 78.9 fL (ref 78.0–100.0)
MCV: 79 fL (ref 78.0–100.0)
Platelets: 486 10*3/uL — ABNORMAL HIGH (ref 150–400)
Platelets: 663 10*3/uL — ABNORMAL HIGH (ref 150–400)
Platelets: 665 10*3/uL — ABNORMAL HIGH (ref 150–400)
Platelets: 741 10*3/uL — ABNORMAL HIGH (ref 150–400)
Platelets: 765 10*3/uL — ABNORMAL HIGH (ref 150–400)
Platelets: 770 10*3/uL — ABNORMAL HIGH (ref 150–400)
Platelets: 836 10*3/uL — ABNORMAL HIGH (ref 150–400)
RBC: 3.42 MIL/uL — ABNORMAL LOW (ref 3.87–5.11)
RBC: 3.73 MIL/uL — ABNORMAL LOW (ref 3.87–5.11)
RBC: 3.76 MIL/uL — ABNORMAL LOW (ref 3.87–5.11)
RBC: 3.78 MIL/uL — ABNORMAL LOW (ref 3.87–5.11)
RBC: 3.79 MIL/uL — ABNORMAL LOW (ref 3.87–5.11)
RBC: 3.87 MIL/uL (ref 3.87–5.11)
RBC: 3.94 MIL/uL (ref 3.87–5.11)
RBC: 4.02 MIL/uL (ref 3.87–5.11)
RDW: 18.3 % — ABNORMAL HIGH (ref 11.5–15.5)
RDW: 18.6 % — ABNORMAL HIGH (ref 11.5–15.5)
RDW: 18.6 % — ABNORMAL HIGH (ref 11.5–15.5)
RDW: 18.8 % — ABNORMAL HIGH (ref 11.5–15.5)
RDW: 18.9 % — ABNORMAL HIGH (ref 11.5–15.5)
RDW: 19 % — ABNORMAL HIGH (ref 11.5–15.5)
RDW: 19 % — ABNORMAL HIGH (ref 11.5–15.5)
RDW: 19.1 % — ABNORMAL HIGH (ref 11.5–15.5)
RDW: 20.9 % — ABNORMAL HIGH (ref 11.5–15.5)
WBC: 13.5 10*3/uL — ABNORMAL HIGH (ref 4.0–10.5)
WBC: 14.5 10*3/uL — ABNORMAL HIGH (ref 4.0–10.5)
WBC: 15 10*3/uL — ABNORMAL HIGH (ref 4.0–10.5)
WBC: 17.8 10*3/uL — ABNORMAL HIGH (ref 4.0–10.5)

## 2011-03-09 LAB — BASIC METABOLIC PANEL
BUN: 27 mg/dL — ABNORMAL HIGH (ref 6–23)
BUN: 42 mg/dL — ABNORMAL HIGH (ref 6–23)
BUN: 48 mg/dL — ABNORMAL HIGH (ref 6–23)
BUN: 51 mg/dL — ABNORMAL HIGH (ref 6–23)
BUN: 58 mg/dL — ABNORMAL HIGH (ref 6–23)
CO2: 20 mEq/L (ref 19–32)
CO2: 22 mEq/L (ref 19–32)
CO2: 22 mEq/L (ref 19–32)
CO2: 24 mEq/L (ref 19–32)
CO2: 30 mEq/L (ref 19–32)
Calcium: 8.1 mg/dL — ABNORMAL LOW (ref 8.4–10.5)
Calcium: 8.5 mg/dL (ref 8.4–10.5)
Calcium: 8.6 mg/dL (ref 8.4–10.5)
Calcium: 8.6 mg/dL (ref 8.4–10.5)
Calcium: 8.7 mg/dL (ref 8.4–10.5)
Calcium: 8.7 mg/dL (ref 8.4–10.5)
Calcium: 8.8 mg/dL (ref 8.4–10.5)
Chloride: 100 mEq/L (ref 96–112)
Chloride: 102 mEq/L (ref 96–112)
Chloride: 106 mEq/L (ref 96–112)
Chloride: 110 mEq/L (ref 96–112)
Creatinine, Ser: 1.57 mg/dL — ABNORMAL HIGH (ref 0.4–1.2)
Creatinine, Ser: 1.74 mg/dL — ABNORMAL HIGH (ref 0.4–1.2)
Creatinine, Ser: 2.1 mg/dL — ABNORMAL HIGH (ref 0.4–1.2)
Creatinine, Ser: 2.56 mg/dL — ABNORMAL HIGH (ref 0.4–1.2)
Creatinine, Ser: 3.06 mg/dL — ABNORMAL HIGH (ref 0.4–1.2)
Creatinine, Ser: 3.81 mg/dL — ABNORMAL HIGH (ref 0.4–1.2)
Creatinine, Ser: 4.02 mg/dL — ABNORMAL HIGH (ref 0.4–1.2)
GFR calc Af Amer: 16 mL/min — ABNORMAL LOW (ref >60)
GFR calc Af Amer: 18 mL/min — ABNORMAL LOW (ref >60)
GFR calc Af Amer: 23 mL/min — ABNORMAL LOW (ref >60)
GFR calc Af Amer: 28 mL/min — ABNORMAL LOW (ref >60)
GFR calc Af Amer: 40 mL/min — ABNORMAL LOW (ref >60)
GFR calc Af Amer: 45 mL/min — ABNORMAL LOW (ref >60)
GFR calc non Af Amer: 12 mL/min — ABNORMAL LOW (ref >60)
GFR calc non Af Amer: 19 mL/min — ABNORMAL LOW (ref >60)
GFR calc non Af Amer: 37 mL/min — ABNORMAL LOW (ref >60)
Glucose, Bld: 100 mg/dL — ABNORMAL HIGH (ref 70–99)
Glucose, Bld: 109 mg/dL — ABNORMAL HIGH (ref 70–99)
Glucose, Bld: 112 mg/dL — ABNORMAL HIGH (ref 70–99)
Glucose, Bld: 113 mg/dL — ABNORMAL HIGH (ref 70–99)
Potassium: 3.6 mEq/L (ref 3.5–5.1)
Potassium: 3.9 mEq/L (ref 3.5–5.1)
Sodium: 140 mEq/L (ref 135–145)
Sodium: 142 mEq/L (ref 135–145)

## 2011-03-09 LAB — IRON AND TIBC
Iron: 11 ug/dL — ABNORMAL LOW (ref 42–135)
Saturation Ratios: 7 % — ABNORMAL LOW (ref 20–55)
TIBC: 157 ug/dL — ABNORMAL LOW (ref 250–470)
UIBC: 146 ug/dL

## 2011-03-09 LAB — GLUCOSE, CAPILLARY: Glucose-Capillary: 102 mg/dL — ABNORMAL HIGH (ref 70–99)

## 2011-03-09 LAB — MAGNESIUM: Magnesium: 2 mg/dL (ref 1.5–2.5)

## 2011-03-10 LAB — CBC
HCT: 24.8 % — ABNORMAL LOW (ref 36.0–46.0)
HCT: 26.7 % — ABNORMAL LOW (ref 36.0–46.0)
HCT: 28.1 % — ABNORMAL LOW (ref 36.0–46.0)
HCT: 29.2 % — ABNORMAL LOW (ref 36.0–46.0)
HCT: 29.2 % — ABNORMAL LOW (ref 36.0–46.0)
HCT: 29.5 % — ABNORMAL LOW (ref 36.0–46.0)
HCT: 30 % — ABNORMAL LOW (ref 36.0–46.0)
HCT: 31.1 % — ABNORMAL LOW (ref 36.0–46.0)
HCT: 31.4 % — ABNORMAL LOW (ref 36.0–46.0)
HCT: 31.7 % — ABNORMAL LOW (ref 36.0–46.0)
HCT: 35.8 % — ABNORMAL LOW (ref 36.0–46.0)
Hemoglobin: 10.1 g/dL — ABNORMAL LOW (ref 12.0–15.0)
Hemoglobin: 11.7 g/dL — ABNORMAL LOW (ref 12.0–15.0)
Hemoglobin: 8 g/dL — ABNORMAL LOW (ref 12.0–15.0)
Hemoglobin: 8.5 g/dL — ABNORMAL LOW (ref 12.0–15.0)
Hemoglobin: 8.9 g/dL — ABNORMAL LOW (ref 12.0–15.0)
Hemoglobin: 9.2 g/dL — ABNORMAL LOW (ref 12.0–15.0)
Hemoglobin: 9.4 g/dL — ABNORMAL LOW (ref 12.0–15.0)
Hemoglobin: 9.5 g/dL — ABNORMAL LOW (ref 12.0–15.0)
Hemoglobin: 9.5 g/dL — ABNORMAL LOW (ref 12.0–15.0)
Hemoglobin: 9.6 g/dL — ABNORMAL LOW (ref 12.0–15.0)
Hemoglobin: 9.7 g/dL — ABNORMAL LOW (ref 12.0–15.0)
MCH: 23.8 pg — ABNORMAL LOW (ref 26.0–34.0)
MCH: 23.8 pg — ABNORMAL LOW (ref 26.0–34.0)
MCH: 23.8 pg — ABNORMAL LOW (ref 26.0–34.0)
MCH: 23.9 pg — ABNORMAL LOW (ref 26.0–34.0)
MCH: 23.9 pg — ABNORMAL LOW (ref 26.0–34.0)
MCH: 23.9 pg — ABNORMAL LOW (ref 26.0–34.0)
MCH: 24 pg — ABNORMAL LOW (ref 26.0–34.0)
MCH: 24 pg — ABNORMAL LOW (ref 26.0–34.0)
MCHC: 31.8 g/dL (ref 30.0–36.0)
MCHC: 32.2 g/dL (ref 30.0–36.0)
MCHC: 32.3 g/dL (ref 30.0–36.0)
MCHC: 32.3 g/dL (ref 30.0–36.0)
MCHC: 32.3 g/dL (ref 30.0–36.0)
MCHC: 32.3 g/dL (ref 30.0–36.0)
MCHC: 33.2 g/dL (ref 30.0–36.0)
MCHC: 33.4 g/dL (ref 30.0–36.0)
MCV: 73.3 fL — ABNORMAL LOW (ref 78.0–100.0)
MCV: 73.8 fL — ABNORMAL LOW (ref 78.0–100.0)
MCV: 73.9 fL — ABNORMAL LOW (ref 78.0–100.0)
MCV: 73.9 fL — ABNORMAL LOW (ref 78.0–100.0)
MCV: 74 fL — ABNORMAL LOW (ref 78.0–100.0)
MCV: 74 fL — ABNORMAL LOW (ref 78.0–100.0)
MCV: 74.1 fL — ABNORMAL LOW (ref 78.0–100.0)
MCV: 74.4 fL — ABNORMAL LOW (ref 78.0–100.0)
MCV: 75 fL — ABNORMAL LOW (ref 78.0–100.0)
MCV: 78.7 fL (ref 78.0–100.0)
Platelets: 414 10*3/uL — ABNORMAL HIGH (ref 150–400)
Platelets: 434 10*3/uL — ABNORMAL HIGH (ref 150–400)
Platelets: 572 10*3/uL — ABNORMAL HIGH (ref 150–400)
Platelets: 585 10*3/uL — ABNORMAL HIGH (ref 150–400)
Platelets: 646 10*3/uL — ABNORMAL HIGH (ref 150–400)
Platelets: 661 10*3/uL — ABNORMAL HIGH (ref 150–400)
Platelets: 667 10*3/uL — ABNORMAL HIGH (ref 150–400)
RBC: 3.35 MIL/uL — ABNORMAL LOW (ref 3.87–5.11)
RBC: 3.55 MIL/uL — ABNORMAL LOW (ref 3.87–5.11)
RBC: 3.57 MIL/uL — ABNORMAL LOW (ref 3.87–5.11)
RBC: 3.95 MIL/uL (ref 3.87–5.11)
RBC: 3.96 MIL/uL (ref 3.87–5.11)
RBC: 3.98 MIL/uL (ref 3.87–5.11)
RBC: 4.01 MIL/uL (ref 3.87–5.11)
RBC: 4.23 MIL/uL (ref 3.87–5.11)
RBC: 4.33 MIL/uL (ref 3.87–5.11)
RDW: 17.4 % — ABNORMAL HIGH (ref 11.5–15.5)
RDW: 17.6 % — ABNORMAL HIGH (ref 11.5–15.5)
RDW: 17.6 % — ABNORMAL HIGH (ref 11.5–15.5)
RDW: 17.8 % — ABNORMAL HIGH (ref 11.5–15.5)
RDW: 17.9 % — ABNORMAL HIGH (ref 11.5–15.5)
RDW: 20 % — ABNORMAL HIGH (ref 11.5–15.5)
RDW: 20.7 % — ABNORMAL HIGH (ref 11.5–15.5)
WBC: 14.9 10*3/uL — ABNORMAL HIGH (ref 4.0–10.5)
WBC: 15 10*3/uL — ABNORMAL HIGH (ref 4.0–10.5)
WBC: 15.4 10*3/uL — ABNORMAL HIGH (ref 4.0–10.5)
WBC: 17.6 10*3/uL — ABNORMAL HIGH (ref 4.0–10.5)
WBC: 19.3 10*3/uL — ABNORMAL HIGH (ref 4.0–10.5)
WBC: 21.6 10*3/uL — ABNORMAL HIGH (ref 4.0–10.5)
WBC: 22.3 10*3/uL — ABNORMAL HIGH (ref 4.0–10.5)
WBC: 22.6 10*3/uL — ABNORMAL HIGH (ref 4.0–10.5)
WBC: 24.3 10*3/uL — ABNORMAL HIGH (ref 4.0–10.5)
WBC: 26.3 10*3/uL — ABNORMAL HIGH (ref 4.0–10.5)
WBC: 28.5 10*3/uL — ABNORMAL HIGH (ref 4.0–10.5)

## 2011-03-10 LAB — POCT I-STAT 3, ART BLOOD GAS (G3+)
Acid-base deficit: 3 mmol/L — ABNORMAL HIGH (ref 0.0–2.0)
Acid-base deficit: 5 mmol/L — ABNORMAL HIGH (ref 0.0–2.0)
Bicarbonate: 17.4 mEq/L — ABNORMAL LOW (ref 20.0–24.0)
Bicarbonate: 22 mEq/L (ref 20.0–24.0)
O2 Saturation: 93 %
O2 Saturation: 94 %
TCO2: 18 mmol/L (ref 0–100)
TCO2: 23 mmol/L (ref 0–100)
pCO2 arterial: 30.4 mmHg — ABNORMAL LOW (ref 35.0–45.0)
pCO2 arterial: 30.9 mmHg — ABNORMAL LOW (ref 35.0–45.0)
pH, Arterial: 7.367 (ref 7.350–7.400)
pO2, Arterial: 69 mmHg — ABNORMAL LOW (ref 80.0–100.0)
pO2, Arterial: 70 mmHg — ABNORMAL LOW (ref 80.0–100.0)

## 2011-03-10 LAB — POCT I-STAT, CHEM 8
Chloride: 111 mEq/L (ref 96–112)
Creatinine, Ser: 1.3 mg/dL — ABNORMAL HIGH (ref 0.4–1.2)
Glucose, Bld: 113 mg/dL — ABNORMAL HIGH (ref 70–99)
HCT: 33 % — ABNORMAL LOW (ref 36.0–46.0)
Hemoglobin: 11.2 g/dL — ABNORMAL LOW (ref 12.0–15.0)
Potassium: 4.3 mEq/L (ref 3.5–5.1)
Sodium: 140 mEq/L (ref 135–145)

## 2011-03-10 LAB — BASIC METABOLIC PANEL
BUN: 11 mg/dL (ref 6–23)
BUN: 16 mg/dL (ref 6–23)
BUN: 40 mg/dL — ABNORMAL HIGH (ref 6–23)
BUN: 47 mg/dL — ABNORMAL HIGH (ref 6–23)
BUN: 8 mg/dL (ref 6–23)
BUN: 9 mg/dL (ref 6–23)
CO2: 15 mEq/L — ABNORMAL LOW (ref 19–32)
CO2: 16 mEq/L — ABNORMAL LOW (ref 19–32)
CO2: 21 mEq/L (ref 19–32)
CO2: 25 mEq/L (ref 19–32)
CO2: 25 mEq/L (ref 19–32)
CO2: 25 mEq/L (ref 19–32)
Calcium: 7.4 mg/dL — ABNORMAL LOW (ref 8.4–10.5)
Calcium: 7.5 mg/dL — ABNORMAL LOW (ref 8.4–10.5)
Calcium: 7.9 mg/dL — ABNORMAL LOW (ref 8.4–10.5)
Calcium: 8.1 mg/dL — ABNORMAL LOW (ref 8.4–10.5)
Calcium: 8.4 mg/dL (ref 8.4–10.5)
Calcium: 8.5 mg/dL (ref 8.4–10.5)
Calcium: 8.5 mg/dL (ref 8.4–10.5)
Calcium: 8.7 mg/dL (ref 8.4–10.5)
Chloride: 102 mEq/L (ref 96–112)
Chloride: 106 mEq/L (ref 96–112)
Chloride: 108 mEq/L (ref 96–112)
Chloride: 108 mEq/L (ref 96–112)
Chloride: 110 mEq/L (ref 96–112)
Chloride: 110 mEq/L (ref 96–112)
Chloride: 110 mEq/L (ref 96–112)
Chloride: 111 mEq/L (ref 96–112)
Creatinine, Ser: 0.56 mg/dL (ref 0.4–1.2)
Creatinine, Ser: 0.75 mg/dL (ref 0.4–1.2)
Creatinine, Ser: 1.63 mg/dL — ABNORMAL HIGH (ref 0.4–1.2)
Creatinine, Ser: 3.25 mg/dL — ABNORMAL HIGH (ref 0.4–1.2)
Creatinine, Ser: 3.42 mg/dL — ABNORMAL HIGH (ref 0.4–1.2)
Creatinine, Ser: 4.01 mg/dL — ABNORMAL HIGH (ref 0.4–1.2)
GFR calc Af Amer: 13 mL/min — ABNORMAL LOW (ref >60)
GFR calc Af Amer: 38 mL/min — ABNORMAL LOW (ref >60)
GFR calc Af Amer: >60 mL/min (ref >60)
GFR calc Af Amer: >60 mL/min (ref >60)
GFR calc Af Amer: >60 mL/min (ref >60)
GFR calc Af Amer: >60 mL/min (ref >60)
GFR calc Af Amer: >60 mL/min (ref >60)
GFR calc non Af Amer: 11 mL/min — ABNORMAL LOW (ref >60)
GFR calc non Af Amer: 31 mL/min — ABNORMAL LOW (ref >60)
GFR calc non Af Amer: 60 mL/min — ABNORMAL LOW (ref >60)
GFR calc non Af Amer: >60 mL/min (ref >60)
GFR calc non Af Amer: >60 mL/min (ref >60)
GFR calc non Af Amer: >60 mL/min (ref >60)
Glucose, Bld: 132 mg/dL — ABNORMAL HIGH (ref 70–99)
Glucose, Bld: 75 mg/dL (ref 70–99)
Glucose, Bld: 82 mg/dL (ref 70–99)
Glucose, Bld: 84 mg/dL (ref 70–99)
Glucose, Bld: 90 mg/dL (ref 70–99)
Glucose, Bld: 91 mg/dL (ref 70–99)
Glucose, Bld: 92 mg/dL (ref 70–99)
Potassium: 3.7 mEq/L (ref 3.5–5.1)
Potassium: 3.8 mEq/L (ref 3.5–5.1)
Potassium: 4.3 mEq/L (ref 3.5–5.1)
Potassium: 4.7 mEq/L (ref 3.5–5.1)
Potassium: 4.8 mEq/L (ref 3.5–5.1)
Potassium: 5.1 mEq/L (ref 3.5–5.1)
Sodium: 135 mEq/L (ref 135–145)
Sodium: 135 mEq/L (ref 135–145)
Sodium: 136 mEq/L (ref 135–145)
Sodium: 138 mEq/L (ref 135–145)
Sodium: 138 mEq/L (ref 135–145)
Sodium: 138 mEq/L (ref 135–145)
Sodium: 139 mEq/L (ref 135–145)
Sodium: 139 mEq/L (ref 135–145)
Sodium: 141 mEq/L (ref 135–145)
Sodium: 141 mEq/L (ref 135–145)

## 2011-03-10 LAB — AFB CULTURE WITH SMEAR (NOT AT ARMC)
Acid Fast Smear: NONE SEEN
Acid Fast Smear: NONE SEEN

## 2011-03-10 LAB — CARDIAC PANEL(CRET KIN+CKTOT+MB+TROPI)
CK, MB: 2.1 ng/mL (ref 0.3–4.0)
Relative Index: INVALID (ref 0.0–2.5)
Total CK: 15 U/L (ref 7–177)
Total CK: 65 U/L (ref 7–177)
Troponin I: 0.01 ng/mL (ref 0.00–0.06)
Troponin I: 0.06 ng/mL (ref 0.00–0.06)

## 2011-03-10 LAB — COMPREHENSIVE METABOLIC PANEL
ALT: 17 U/L (ref 0–35)
ALT: 19 U/L (ref 0–35)
ALT: 25 U/L (ref 0–35)
AST: 27 U/L (ref 0–37)
AST: 29 U/L (ref 0–37)
Albumin: 1.6 g/dL — ABNORMAL LOW (ref 3.5–5.2)
Albumin: 2.2 g/dL — ABNORMAL LOW (ref 3.5–5.2)
Alkaline Phosphatase: 134 U/L — ABNORMAL HIGH (ref 39–117)
Alkaline Phosphatase: 144 U/L — ABNORMAL HIGH (ref 39–117)
Alkaline Phosphatase: 182 U/L — ABNORMAL HIGH (ref 39–117)
BUN: 29 mg/dL — ABNORMAL HIGH (ref 6–23)
BUN: 51 mg/dL — ABNORMAL HIGH (ref 6–23)
CO2: 15 mEq/L — ABNORMAL LOW (ref 19–32)
CO2: 18 mEq/L — ABNORMAL LOW (ref 19–32)
CO2: 23 mEq/L (ref 19–32)
Calcium: 7.4 mg/dL — ABNORMAL LOW (ref 8.4–10.5)
Chloride: 108 mEq/L (ref 96–112)
Chloride: 110 mEq/L (ref 96–112)
Chloride: 112 mEq/L (ref 96–112)
Creatinine, Ser: 1.21 mg/dL — ABNORMAL HIGH (ref 0.4–1.2)
Creatinine, Ser: 3.04 mg/dL — ABNORMAL HIGH (ref 0.4–1.2)
GFR calc Af Amer: 18 mL/min — ABNORMAL LOW (ref >60)
GFR calc non Af Amer: 15 mL/min — ABNORMAL LOW (ref >60)
GFR calc non Af Amer: 44 mL/min — ABNORMAL LOW (ref >60)
Glucose, Bld: 69 mg/dL — ABNORMAL LOW (ref 70–99)
Glucose, Bld: 90 mg/dL (ref 70–99)
Glucose, Bld: 95 mg/dL (ref 70–99)
Potassium: 4.7 mEq/L (ref 3.5–5.1)
Potassium: 5 mEq/L (ref 3.5–5.1)
Potassium: 5.4 mEq/L — ABNORMAL HIGH (ref 3.5–5.1)
Sodium: 136 mEq/L (ref 135–145)
Sodium: 137 mEq/L (ref 135–145)
Sodium: 138 mEq/L (ref 135–145)
Total Bilirubin: 0.6 mg/dL (ref 0.3–1.2)
Total Bilirubin: 0.7 mg/dL (ref 0.3–1.2)
Total Bilirubin: 0.8 mg/dL (ref 0.3–1.2)
Total Protein: 4.7 g/dL — ABNORMAL LOW (ref 6.0–8.3)
Total Protein: 5.1 g/dL — ABNORMAL LOW (ref 6.0–8.3)
Total Protein: 5.8 g/dL — ABNORMAL LOW (ref 6.0–8.3)

## 2011-03-10 LAB — URINALYSIS, MICROSCOPIC ONLY
Glucose, UA: NEGATIVE mg/dL
Ketones, ur: 15 mg/dL — AB
Protein, ur: 30 mg/dL — AB
Urobilinogen, UA: 4 mg/dL — ABNORMAL HIGH (ref 0.0–1.0)

## 2011-03-10 LAB — CROSSMATCH: ABO/RH(D): O POS

## 2011-03-10 LAB — SODIUM, URINE, RANDOM
Sodium, Ur: 150 mEq/L
Sodium, Ur: 72 mEq/L

## 2011-03-10 LAB — GRAM STAIN

## 2011-03-10 LAB — CULTURE, BLOOD (ROUTINE X 2)
Culture: NO GROWTH
Culture: NO GROWTH

## 2011-03-10 LAB — TYPE AND SCREEN
ABO/RH(D): O POS
Antibody Screen: NEGATIVE

## 2011-03-10 LAB — DIFFERENTIAL
Basophils Absolute: 0 10*3/uL (ref 0.0–0.1)
Basophils Relative: 0 % (ref 0–1)
Eosinophils Absolute: 0.4 10*3/uL (ref 0.0–0.7)
Eosinophils Relative: 1 % (ref 0–5)
Eosinophils Relative: 2 % (ref 0–5)
Eosinophils Relative: 3 % (ref 0–5)
Lymphocytes Relative: 10 % — ABNORMAL LOW (ref 12–46)
Lymphocytes Relative: 11 % — ABNORMAL LOW (ref 12–46)
Lymphocytes Relative: 12 % (ref 12–46)
Lymphs Abs: 1.4 10*3/uL (ref 0.7–4.0)
Lymphs Abs: 1.6 10*3/uL (ref 0.7–4.0)
Lymphs Abs: 1.8 10*3/uL (ref 0.7–4.0)
Lymphs Abs: 2 10*3/uL (ref 0.7–4.0)
Monocytes Absolute: 1.5 10*3/uL — ABNORMAL HIGH (ref 0.1–1.0)
Monocytes Absolute: 1.5 10*3/uL — ABNORMAL HIGH (ref 0.1–1.0)
Monocytes Relative: 10 % (ref 3–12)
Monocytes Relative: 6 % (ref 3–12)
Monocytes Relative: 7 % (ref 3–12)
Monocytes Relative: 9 % (ref 3–12)
Neutro Abs: 13.4 10*3/uL — ABNORMAL HIGH (ref 1.7–7.7)
Neutro Abs: 21.1 10*3/uL — ABNORMAL HIGH (ref 1.7–7.7)
Neutro Abs: 24.8 10*3/uL — ABNORMAL HIGH (ref 1.7–7.7)
Neutrophils Relative %: 75 % (ref 43–77)
Neutrophils Relative %: 85 % — ABNORMAL HIGH (ref 43–77)

## 2011-03-10 LAB — POCT CARDIAC MARKERS
CKMB, poc: <1 ng/mL — ABNORMAL LOW (ref 1.0–8.0)
CKMB, poc: <1 ng/mL — ABNORMAL LOW (ref 1.0–8.0)
Myoglobin, poc: 83.9 ng/mL (ref 12–200)
Troponin i, poc: <0.05 ng/mL (ref 0.00–0.09)

## 2011-03-10 LAB — URINALYSIS, ROUTINE W REFLEX MICROSCOPIC
Bilirubin Urine: NEGATIVE
Bilirubin Urine: NEGATIVE
Ketones, ur: NEGATIVE mg/dL
Ketones, ur: NEGATIVE mg/dL
Nitrite: NEGATIVE
Nitrite: NEGATIVE
Specific Gravity, Urine: 1.007 (ref 1.005–1.030)
Specific Gravity, Urine: 1.013 (ref 1.005–1.030)
Urobilinogen, UA: 0.2 mg/dL (ref 0.0–1.0)
pH: 5.5 (ref 5.0–8.0)
pH: 5.5 (ref 5.0–8.0)

## 2011-03-10 LAB — URINE CULTURE
Colony Count: NO GROWTH
Culture: NO GROWTH

## 2011-03-10 LAB — TISSUE CULTURE

## 2011-03-10 LAB — BODY FLUID CULTURE

## 2011-03-10 LAB — CREATININE, URINE, RANDOM
Creatinine, Urine: 50.7 mg/dL
Creatinine, Urine: 65.3 mg/dL

## 2011-03-10 LAB — IRON AND TIBC
Iron: <10 ug/dL — ABNORMAL LOW (ref 42–135)
UIBC: 191 ug/dL

## 2011-03-10 LAB — CULTURE, BAL-QUANTITATIVE W GRAM STAIN

## 2011-03-10 LAB — URINE MICROSCOPIC-ADD ON

## 2011-03-10 LAB — RETICULOCYTES
RBC.: 4.8 MIL/uL (ref 3.87–5.11)
Retic Count, Absolute: 14.4 10*3/uL — ABNORMAL LOW (ref 19.0–186.0)

## 2011-03-10 LAB — APTT: aPTT: 43 seconds — ABNORMAL HIGH (ref 24–37)

## 2011-03-10 LAB — CK TOTAL AND CKMB (NOT AT ARMC)
CK, MB: 1.6 ng/mL (ref 0.3–4.0)
Total CK: 16 U/L (ref 7–177)

## 2011-03-10 LAB — MAGNESIUM: Magnesium: 2.4 mg/dL (ref 1.5–2.5)

## 2011-03-10 LAB — PROTIME-INR
INR: 1.36 (ref 0.00–1.49)
Prothrombin Time: 15.2 seconds (ref 11.6–15.2)

## 2011-03-10 LAB — PREPARE RBC (CROSSMATCH)

## 2011-03-10 LAB — TSH: TSH: 0.832 u[IU]/mL (ref 0.350–4.500)

## 2011-03-10 LAB — VANCOMYCIN, TROUGH: Vancomycin Tr: 55 ug/mL (ref 10.0–20.0)

## 2011-03-10 LAB — FERRITIN: Ferritin: 170 ng/mL (ref 10–291)

## 2011-03-10 LAB — D-DIMER, QUANTITATIVE: D-Dimer, Quant: 1.61 ug/mL-FEU — ABNORMAL HIGH (ref 0.00–0.48)

## 2011-03-10 LAB — FOLATE: Folate: 12.4 ng/mL

## 2011-03-10 LAB — MRSA PCR SCREENING: MRSA by PCR: NEGATIVE

## 2011-03-10 LAB — TECHNOLOGIST SMEAR REVIEW

## 2011-03-10 LAB — FUNGUS CULTURE W SMEAR: Fungal Smear: NONE SEEN

## 2011-03-12 ENCOUNTER — Encounter (INDEPENDENT_AMBULATORY_CARE_PROVIDER_SITE_OTHER): Payer: Self-pay | Admitting: Nurse Practitioner

## 2011-03-12 LAB — CK TOTAL AND CKMB (NOT AT ARMC)
CK, MB: 3 ng/mL (ref 0.3–4.0)
Relative Index: 1.9 (ref 0.0–2.5)
Total CK: 158 U/L (ref 7–177)

## 2011-03-12 LAB — URINALYSIS, ROUTINE W REFLEX MICROSCOPIC
Glucose, UA: NEGATIVE mg/dL
Specific Gravity, Urine: 1.025 (ref 1.005–1.030)
pH: 5.5 (ref 5.0–8.0)

## 2011-03-12 LAB — URINE MICROSCOPIC-ADD ON

## 2011-03-12 LAB — TROPONIN I: Troponin I: 0.01 ng/mL (ref 0.00–0.06)

## 2011-03-12 LAB — CBC
Hemoglobin: 11.8 g/dL — ABNORMAL LOW (ref 12.0–15.0)
MCHC: 31.7 g/dL (ref 30.0–36.0)
RBC: 5.06 MIL/uL (ref 3.87–5.11)
RDW: 18.1 % — ABNORMAL HIGH (ref 11.5–15.5)

## 2011-03-12 LAB — DIFFERENTIAL
Basophils Absolute: 0 10*3/uL (ref 0.0–0.1)
Basophils Relative: 0 % (ref 0–1)
Eosinophils Relative: 0 % (ref 0–5)
Lymphocytes Relative: 13 % (ref 12–46)
Monocytes Absolute: 0.5 10*3/uL (ref 0.1–1.0)
Neutro Abs: 6.6 10*3/uL (ref 1.7–7.7)

## 2011-03-12 LAB — URINE CULTURE: Colony Count: >=100000

## 2011-03-12 LAB — PROTIME-INR: Prothrombin Time: 13.9 seconds (ref 11.6–15.2)

## 2011-03-21 ENCOUNTER — Ambulatory Visit (HOSPITAL_COMMUNITY)
Admission: RE | Admit: 2011-03-21 | Discharge: 2011-03-21 | Disposition: A | Discharge status: Home / Self Care | Payer: Medicare (Managed Care) | Source: Ambulatory Visit | Attending: Family Medicine | Admitting: Family Medicine

## 2011-03-21 DIAGNOSIS — Z1231 Encounter for screening mammogram for malignant neoplasm of breast: Secondary | ICD-10-CM | POA: Insufficient documentation

## 2011-03-26 ENCOUNTER — Encounter (HOSPITAL_BASED_OUTPATIENT_CLINIC_OR_DEPARTMENT_OTHER): Payer: Medicare (Managed Care) | Attending: Internal Medicine

## 2011-03-26 DIAGNOSIS — K2100 Gastro-esophageal reflux disease with esophagitis, without bleeding: Secondary | ICD-10-CM | POA: Insufficient documentation

## 2011-03-26 DIAGNOSIS — G35 Multiple sclerosis: Secondary | ICD-10-CM | POA: Insufficient documentation

## 2011-03-26 DIAGNOSIS — Z79899 Other long term (current) drug therapy: Secondary | ICD-10-CM | POA: Insufficient documentation

## 2011-03-26 DIAGNOSIS — L8993 Pressure ulcer of unspecified site, stage 3: Secondary | ICD-10-CM | POA: Insufficient documentation

## 2011-03-26 DIAGNOSIS — K5900 Constipation, unspecified: Secondary | ICD-10-CM | POA: Insufficient documentation

## 2011-03-26 DIAGNOSIS — L89309 Pressure ulcer of unspecified buttock, unspecified stage: Secondary | ICD-10-CM | POA: Insufficient documentation

## 2011-03-27 NOTE — Letter (Signed)
Summary: [facility]   Imported By: Arta Bruce 03/22/2011 14:04:15  _____________________________________________________________________  External Attachment:    Type:   Image     Comment:   External Document

## 2011-03-29 LAB — CBC
HCT: 33 % — ABNORMAL LOW (ref 36.0–46.0)
HCT: 37.9 % (ref 36.0–46.0)
HCT: 38.7 % (ref 36.0–46.0)
Hemoglobin: 11.1 g/dL — ABNORMAL LOW (ref 12.0–15.0)
Hemoglobin: 12.4 g/dL (ref 12.0–15.0)
Hemoglobin: 12.8 g/dL (ref 12.0–15.0)
MCHC: 33 g/dL (ref 30.0–36.0)
MCV: 77.8 fL — ABNORMAL LOW (ref 78.0–100.0)
Platelets: 290 10*3/uL (ref 150–400)
RBC: 4.31 MIL/uL (ref 3.87–5.11)
RDW: 16 % — ABNORMAL HIGH (ref 11.5–15.5)
RDW: 16.1 % — ABNORMAL HIGH (ref 11.5–15.5)
WBC: 17.6 10*3/uL — ABNORMAL HIGH (ref 4.0–10.5)
WBC: 18.5 10*3/uL — ABNORMAL HIGH (ref 4.0–10.5)

## 2011-03-29 LAB — COMPREHENSIVE METABOLIC PANEL
Albumin: 3.7 g/dL (ref 3.5–5.2)
Alkaline Phosphatase: 141 U/L — ABNORMAL HIGH (ref 39–117)
BUN: 19 mg/dL (ref 6–23)
CO2: 22 mEq/L (ref 19–32)
Chloride: 106 mEq/L (ref 96–112)
Creatinine, Ser: 0.88 mg/dL (ref 0.4–1.2)
GFR calc non Af Amer: >60 mL/min (ref >60)
Glucose, Bld: 98 mg/dL (ref 70–99)
Potassium: 4.2 mEq/L (ref 3.5–5.1)
Total Bilirubin: 0.7 mg/dL (ref 0.3–1.2)

## 2011-03-29 LAB — URINALYSIS, MICROSCOPIC ONLY
Glucose, UA: NEGATIVE mg/dL
Ketones, ur: 15 mg/dL — AB
Nitrite: POSITIVE — AB
Protein, ur: 30 mg/dL — AB
pH: 7.5 (ref 5.0–8.0)

## 2011-03-29 LAB — BASIC METABOLIC PANEL
Calcium: 8.4 mg/dL (ref 8.4–10.5)
Chloride: 108 mEq/L (ref 96–112)
GFR calc Af Amer: >60 mL/min (ref >60)
GFR calc non Af Amer: >60 mL/min (ref >60)
GFR calc non Af Amer: >60 mL/min (ref >60)
Glucose, Bld: 108 mg/dL — ABNORMAL HIGH (ref 70–99)
Potassium: 3.9 mEq/L (ref 3.5–5.1)
Potassium: 4.4 mEq/L (ref 3.5–5.1)
Sodium: 137 mEq/L (ref 135–145)
Sodium: 138 mEq/L (ref 135–145)

## 2011-03-29 LAB — URINE CULTURE: Colony Count: >=100000

## 2011-03-29 LAB — DIFFERENTIAL
Basophils Relative: 0 % (ref 0–1)
Lymphocytes Relative: 5 % — ABNORMAL LOW (ref 12–46)
Lymphs Abs: 0.7 10*3/uL (ref 0.7–4.0)
Monocytes Absolute: 0.3 10*3/uL (ref 0.1–1.0)
Monocytes Relative: 2 % — ABNORMAL LOW (ref 3–12)
Neutro Abs: 13.8 10*3/uL — ABNORMAL HIGH (ref 1.7–7.7)
Neutrophils Relative %: 92 % — ABNORMAL HIGH (ref 43–77)

## 2011-03-29 LAB — TROPONIN I: Troponin I: 0.02 ng/mL (ref 0.00–0.06)

## 2011-03-29 LAB — PROTIME-INR: INR: 1.07 (ref 0.00–1.49)

## 2011-03-29 LAB — APTT: aPTT: 38 seconds — ABNORMAL HIGH (ref 24–37)

## 2011-04-01 LAB — POCT I-STAT, CHEM 8
BUN: 13 mg/dL (ref 6–23)
Calcium, Ion: 1.14 mmol/L (ref 1.12–1.32)
Creatinine, Ser: 0.9 mg/dL (ref 0.4–1.2)
Glucose, Bld: 88 mg/dL (ref 70–99)
TCO2: 25 mmol/L (ref 0–100)

## 2011-04-01 LAB — POCT URINALYSIS DIP (DEVICE)
Ketones, ur: NEGATIVE mg/dL
Protein, ur: 30 mg/dL — AB
Specific Gravity, Urine: 1.01 (ref 1.005–1.030)
Urobilinogen, UA: 1 mg/dL (ref 0.0–1.0)
pH: 6 (ref 5.0–8.0)

## 2011-05-08 NOTE — H&P (Signed)
NAMEELIZA, GREEN               ACCOUNT NO.:  [id number]   MEDICAL RECORD NO.:  [id number]          PATIENT TYPE:  EMS   LOCATION:  ED                           [facility]ACILITY:  [facility]   PHYSICIAN:  Beckey Rutter, MD  DATE OF BIRTH:  1941/12/21   DATE OF ADMISSION:  01/23/2008  DATE OF DISCHARGE:                              HISTORY & PHYSICAL   PRIMARY CARE PHYSICIAN:  [name], [facility].   CHIEF COMPLAINT:  Fall.   HISTORY OF PRESENT ILLNESS:  This is a 70 year old pleasant Caucasian  female with multiple medical problems, including depression, multiple  sclerosis, diverticulosis, esophageal stricture, gastroesophageal reflux  disease, hiatal hernia, obesity, and recurrent pneumonia presented today  because of weakness that started yesterday.  The patient was in her  usual state of health yesterday when she fell day.  She managed to crawl  to call her daughter on the phone and sit in the chair.  The patient was  feeling okay for some time after the fall, but she could not move  herself out of the chair she was sitting on when she called and phoned  her daughter.  The patient spent the night on the chair, and she was  very weak when the ambulance came to take her.  The patient could not  give any account for fever, chills, but she admitted to having some  cough productive to nonspecific color/whitish sputum.  She also noticed  to have headaches, but she also stated she usually has headaches on and  off.   PAST MEDICAL HISTORY:  1. Diverticulosis.  2. Gastroesophageal reflux disease.  3. Hiatal hernia.  4. Multiple sclerosis.  Patient following with Dr. Orlin Hilding.  5. Obesity.  6. Recurrent pneumonia.   SOCIAL HISTORY:  The patient lives alone.  Denied tobacco abuse, ethanol  abuse, or drug abuse.   FAMILY HISTORY:  Noncontributory.   ALLERGIES:  NOT KNOWN TO HAVE MEDICATION ALLERGY.   MEDICATIONS:  1. Neurontin.  2. Reglan.  3. Betaseron.  4. Flavoxate.  5.  Multivitamin.  6. Omeprazole.  7. Oxybutynin chloride.  8. Trimethoprim.  9. Valium.  10.Amitriptyline.  11.Nabumetone.   REVIEW OF SYSTEMS:  A 12-point review of systems revealing on headache  and the cough with whitish sputum as discussed above in the H&P.   PHYSICAL EXAMINATION:  VITAL SIGNS:  Temperature 101.1, blood pressure  102/62, pulse 108, respiratory rate 20.  HEENT:  Head atraumatic, normocephalic.  Eyes:  PERRL.  Mouth:  Dry  mucosa on the lips and tongue.  NECK:  Supple.  No JVD.  PRECORDIUM:  Distal heart sounds.  LUNGS:  Rales in the left basal area on posterior examination.  ABDOMEN:  Obese.  Bowel sounds present.  Nontender.  EXTREMITIES:  The patient has left foot pitting edema and above ankle  skin change and redness compatible with venous stasis change.  NEUROLOGIC:  She is alert and oriented x3, moving all of her extremities  spontaneously.   LABORATORY DATA:  Microscopic urine showing many bacteria by high-power  field.  Urinalysis is positive for nitrate and leukocyte esterase.  Lipase is 16.  Sodium 137, potassium 3.7, chloride 108, bicarb 23,  glucose 117, BUN 9, creatinine 0.91.  White blood count 12.1 with 83%  neutrophils, hemoglobin 11.4, hematocrit 34.2.   Chest x-ray is done today.  Preliminary report suggesting left lower  lobe opacity suspicious for pneumonia.   ASSESSMENT:  This is a 70 year old with multiple sclerosis who presented  today with generalized weakness, temperature in the emergency  department, and leukocytosis, likely secondary to left lower lobe  pneumonia as well as evidence of urinary tract infection.   PLAN:  1. The patient will be admitted for IV antibiotics and for further      management.  2. I will start the patient on antibiotic for pneumonia, and hopefully      we will cover the urinary tract infection as well with the same      antibiotic.  We will send a sputum culture, urine culture, as well      as blood  cultures.  3. We will continue with gentle hydration.  4. We will continue the patient on her regular medication for the      multiple sclerosis, Betaseron.  The patient has slight weakness and      some symptoms of multiple sclerosis flare which I think is      primarily secondary to the infection, the pneumonia, and the      urinary tract infection the patient is suffering rather than actual      MS flare.  5. For DVT prophylaxis, I will start Lovenox.  For GI prophylaxis and      also for management of the GERD symptoms, I will start the patient      on Protonix 40 mg daily.      Beckey Rutter, MD  Electronically Signed     EME/MEDQ  D:  01/23/2008  T:  01/23/2008  Job:  [id number]   cc:   Marcene Duos, M.D.

## 2011-05-08 NOTE — H&P (Signed)
[name], TAPP               ACCOUNT NO.:  [id number]   MEDICAL RECORD NO.:  [id number]          PATIENT TYPE:  INP   LOCATION:  1442                         FACILITY:  [facility]   PHYSICIAN:  [name], M.D.DATE OF BIRTH:  31-Oct-1941   DATE OF ADMISSION:  07/24/2007  DATE OF DISCHARGE:                              HISTORY & PHYSICAL   PRIMARY CARE PHYSICIAN:  [name] at [name].   HISTORY:  This is a 70 year old lady who has a history of multiple  sclerosis, large hiatal hernia with severe gastroesophageal reflux  disease which has caused erosive esophagitis and a stricture formation,  who now presents with vomiting black charcoal emesis for the last 3  days associated with a cough and now a fever.  She has had a previous  history of aspiration pneumonia.  She denies any melena or any  significant abdominal pain.   PAST MEDICAL HISTORY:  1. Multiple sclerosis diagnosed in 2002, under the care of Dr.      Orlin Hilding.  2. Esophageal stricture for which she has had repeated dilatations and      I believe the last one was in December 2006, by Dr. Marina Goodell of      [facility].  3. She has a history of chronic iron-deficiency anemia related to      bleeding from her esophageal stricture.  4. History of diverticulosis.  5. History of depression.  6. Obesity.  7. History of pyelonephritis in January 2008.  8. Degenerative joint disease.   PAST SURGICAL HISTORY:  Cholecystectomy in 1970.   SOCIAL HISTORY:  She is a widow and lives alone.  She is fairly  independent and is able to get around with a walker, despite her  multiple sclerosis.  She does not smoke and does not drink alcohol.   ALLERGIES:  None.   MEDICATIONS:  Neurontin - dose unclear, Reglan - dose unclear,  Betaseron,  multivitamin, omeprazole, oxybutynin chloride, trimethoprim,  Valium, amitriptyline, nabumetone, all of the doses of these medications  unclear.   REVIEW OF SYSTEMS:  Apart from the  symptoms mentioned above, there are  no other symptoms referable to all systems reviewed.   FAMILY HISTORY:  Noncontributory.   PHYSICAL EXAMINATION:  VITAL SIGNS:  Temperature 101.2 on admission to  the emergency room, approximately 2 hours ago but now she feels  normothermic.  Blood pressure 138/56, pulse 80 and in sinus rhythm,  respiratory rate 14, saturation 100%.  GENERAL:  She does not look toxic, and she does not look clinically  pale.  CARDIOVASCULAR:  Heart sounds are present and normal.  There are no  murmurs.  RESPIRATORY:  There are bilateral basal crackles.  ABDOMEN:  Soft and nontender with no hepatosplenomegaly.  NEUROLOGIC:  She is alert and oriented.  I did not do a formal thorough  neurological examination as deemed to be currently not relevant to her  presenting problem.   INVESTIGATIONS:  Chest x-ray showed possible basal  infiltrates/shadowing.  Sodium 140, potassium 4.3, chloride 107,  bicarbonate 25, glucose 116, BUN 13, creatinine 0.80.  Hemoglobin 10.2  with  an MCV of 68.2, white blood cell count 12.5, platelets 459.  Emesis, gastric contents are positive for blood.   IMPRESSION:  1. Upper gastrointestinal  bleed secondary to likely erosive      esophagitis.  2. Likely recurrent esophageal stricture.  3. Possible aspiration pneumonia.  4. Multiple sclerosis.   PLAN:  1. Admit.  2. Watch hemoglobin.  3. Intravenous antibiotics to cover for aspiration pneumonia.  4. GI consult as I think the patient may well need another esophageal      dilatation.   Further recommendations will depend on the patient's hospital progress.      [name], M.D.  Electronically Signed     NCG/MEDQ  D:  07/24/2007  T:  07/24/2007  Job:  [id number]   cc:   [name]. Marina Goodell, MD  [address]  [place]  [place] [address]

## 2011-05-08 NOTE — Discharge Summary (Signed)
[name], [name]               ACCOUNT NO.:  [id number]   MEDICAL RECORD NO.:  [id number]          PATIENT TYPE:  INP   LOCATION:  1442                         FACILITY:  [facility]   PHYSICIAN:  [name], M.D.DATE OF BIRTH:  08/05/1941   DATE OF ADMISSION:  07/24/2007  DATE OF DISCHARGE:                               DISCHARGE SUMMARY   FINAL DISCHARGE DIAGNOSES:  1. Upper gastrointestinal bleed, etiology likely to be erosive      esophagitis.  2. Aspiration pneumonia, improving.   PROCEDURES:  Upper GI endoscopy which showed hiatal hernia, esophagitis,  esophageal stricture which did not require dilatation.  There was no  evidence of recent bleeding.  She also underwent a barium swallow, which  only showed a small hiatal hernia, and there was a nonspecific  esophageal motility disorder.   CONDITION ON DISCHARGE:  Stable.   MEDICATIONS ON DISCHARGE:  Continue with home medications.  Also:   1. Augmentin 875 mg b.i.d. for one week.  2. Ferrous sulfate 325 mg b.i.d. to continue.   HISTORY:  This very pleasant lady of 44 came in with vomiting black,  charcoal emesis for the last 3 days and also had a cough associated with  a fever.  Please see initial history and physical examination done by  myself.   HOSPITAL PROGRESS:  The patient was admitted and her admitting  hemoglobin was 10.2.  This then decreased to the 8.2 range.  However, a  transfusion was not given at this point and her hemoglobin was merely  watched.  She was seen by gastroenterology, who did an upper GI  endoscopy which did not show any source of bleeding but she did have an  esophageal stricture, which was not deemed to need any dilatation at  this stage.  Chest x-ray had shown an left-sided pneumonia, likely  aspiration, and she was treated with intravenous antibiotics with Zosyn  and clindamycin for this.  She seemed to be doing well and she did well  in the subsequent few days.  On date of discharge  today she is doing  well and tolerating a diet without any further vomiting.  There is no  significant cough or shortness of breath.   PHYSICAL EXAMINATION:  Temperature 99, blood pressure 148/68, pulse 68,  saturation 96% on room air.  There were no new physical findings, and her lung fields are clinically  clear.   Chest x-ray was done yesterday, which actually shows an improvement in  the pneumonia/infiltrate.  Blood work today shows hemoglobin 8.9, white  blood cell count 5.2, platelets 417.  Sodium 141, potassium 4.0,  bicarbonate 25, BUN 12, creatinine 0.85.   FURTHER DISPOSITION:  I am going to send her home with another week's  course of Augmentin 875 mg b.i.d. and a prescription for ferrous sulfate  325 mg b.i.d.  I have urged her to see her primary care physician, Dr.  Delrae Alfred, in about one week's time for repeat of her CBC, and also she  may need a repeat of her chest x-ray.      [name], M.D.  Electronically Signed     NCG/MEDQ  D:  07/28/2007  T:  07/28/2007  Job:  [id number]   cc:   Marcene Duos, M.D.

## 2011-05-08 NOTE — Assessment & Plan Note (Signed)
OFFICE VISIT   [name], [name]  DOB:  Feb 23, 1941                                       10/05/2009  AOZHY#:[id number]   The patient is a 70 year old female referred for left leg swelling and  ulceration.  She states the swelling has been present in her left leg  for several years.  She denies any prior history of DVT.  She states  that sometimes she has aching in her left ankle.  She has a history of  multiple sclerosis and has had weakness in her left leg requiring her to  use a walker over the last year.  She stated that she developed an ulcer  in her ankle approximately 3 weeks ago.  Apparently an Unna boot was  placed on this last week and the ulcer has now completely healed.   Her atherosclerotic risk factors are fairly insignificant.  She denies  history of diabetes, hypertension, elevated cholesterol or coronary  artery disease.  She has no significant family history of peripheral  arterial disease.   PAST SURGICAL HISTORY:  She had a cholecystectomy.   PAST MEDICAL HISTORY:  Reflux, multiple joint arthritis and chronic back  pain.   MEDICATIONS:  1. Gabapentin 300 mg 1 tablet twice a day.  2. Omeprazole 20 mg twice a day.  3. Oxybutynin 5 mg twice a day  4. Amitriptyline 50 mg 2 tablets q.h.s.  5. Etodolac 400 mg twice a day.  6. Betaseron 0.3 mg every other day.   ALLERGIES:  She has no known drug allergies.   FAMILY HISTORY:  Unremarkable.   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  She does  not have a smoking or alcohol history.   REVIEW OF SYSTEMS:  She is 5 feet 4 inches, 185 pounds.  CARDIAC:  She denies chest pain or tightness in her chest.  GI:  She has a history of hiatal hernia, reflux, constipation.  RENAL:  She has some urinary frequency.  ORTHOPEDIC:  She has multiple joint arthritis.  Psychiatric, ENT, hematologic, neurologic, vascular and pulmonary review  of systems are all negative.   PHYSICAL  EXAM:  Vital signs:  Blood pressure is 128/82 in the left arm,  pulse 101 and regular.  HEENT:  Unremarkable.  Neck:  Has 2+ carotid  pulses without bruit.  Chest:  Clear to auscultation.  Cardiac:  Exam is  regular rate and rhythm without murmur.  Abdomen:  Is obese, soft,  nontender, nondistended.  No masses.  Extremities:  She has 2+ radial  and femoral pulses bilaterally.  She has 1+ dorsalis pedis and posterior  tibial pulses bilaterally.  She has a 3 x 3 cm area of healing  ulceration on the left lateral malleolus area.  She has some mild  erythema on the medial aspect of her left leg in the gaiter area.   I reviewed her ABIs from [facility] dated 09/08/2009.  This  showed an ABI of greater than 1 bilaterally with triphasic waveforms  which is normal.   She also previously had two venous duplex exams in the past year which  showed no evidence of DVT.   I believe the patient primarily has chronic lower extremity edema in the  left leg.  Some of this may be due to autonomic dysfunction due to her  MS.  Some of this may also be to some venous incompetence of her  superficial vein system.  I believe the best option at this point since  her ulcer has now completely healed is to keep her in compression  stockings to try to reduce the risk of her developing a new ulceration.  She does not have any significant risk of limb loss as she has normal  arterial circulation bilaterally.  If she has recurrence of her  ulcerations we will consider doing a venous duplex exam to check her  superficial vein incompetence to see whether or not this may be the  source of her development of the ulcer.  She will follow up with me on  an as-needed basis.   Janetta Hora. Fields, MD  Electronically Signed   CEF/MEDQ  D:  10/05/2009  T:  10/06/2009  Job:  [id number]   cc:   Marcene Duos, M.D.

## 2011-05-08 NOTE — Assessment & Plan Note (Signed)
OFFICE VISIT   [name], [name]  DOB:  November 08, 1941                                        August 17, 2010  CHART #:  [id number]   HISTORY:  The patient returns to the office today for postop surgical  visit after she had presented to [facility] with pneumonia and a  large left empyema on July 19, 2010.  She underwent bronchoscopy, left  video-assisted thoracoscopy, mini thoracotomy, and evacuation of empyema  with decortication.  Associated with the infection and surgery, she had  also developed acute renal failure.  Her underlying medical problems  made recovery slow, but ultimately was discharged back to [facility].  She seems to be doing reasonably well from her  baseline when seen today.  She has had no fever or chills.  A followup  chest x-ray was done today.   PHYSICAL EXAMINATION:  Today, she appears to be in a chronically ill  state, but in no distress.  Her blood pressure 128/90; pulse is regular  at 120; respiratory rate 16, nonlabored on room air; and O2 sats 97%.  Her breath sounds are slightly decreased at the left base, otherwise  clear.  The left chest incisions are all healing well.   DIAGNOSTIC TESTS:  Followup chest x-ray shows still some pleural  reaction, but otherwise continuing to improve from her earlier films.   IMPRESSION:  At this point, she needs no further specific therapy for  the empyema.  However, her caretakers note she has had multiple episodes  of pneumonia in the past probably related to aspiration.  With her  underlying neurologic disease, it may be prudent to have formal  swallowing  evaluation performed and treat accordingly.  I have not made a specific  appointment to see her back as her postoperative issues are now  resolved.   Sheliah Plane, MD  Electronically Signed   EG/MEDQ  D:  08/17/2010  T:  08/17/2010  Job:  [id number]   cc:   Dr. Lehman Prom  Bennett Scrape, MD

## 2011-05-08 NOTE — Discharge Summary (Signed)
NAMECHRIS, NARASIMHAN               ACCOUNT NO.:  [id number]   MEDICAL RECORD NO.:  [id number]          PATIENT TYPE:  INP   LOCATION:  1345                         [facility]ACILITY[facility]  [facility]   PHYSICIAN:  Hillery Aldo, M.D.   DATE OF BIRTH:  11/24/1941   DATE OF ADMISSION:  01/23/2008  DATE OF DISCHARGE:  01/29/2008                               DISCHARGE SUMMARY   PRIMARY CARE PHYSICIAN:  [name] at [facility].   DISCHARGE DIAGNOSES:  1. Left lower lobe pneumonia.  2. Coagulase-negative Staphylococcus urinary tract infection.  3. Multiple sclerosis.  4. Constipation.  5. History of diverticulosis.  6. Gastroesophageal reflux disease.  7. Hiatal hernia.  8. Obesity.   DISCHARGE MEDICATIONS:  1. Neurontin 300 mg t.i.d.  2. Reglan 10 mg q.a.c. and h.s.  3. Betasone 0.3 mg subcutaneously every other day.  4. Flavoxate 100 mg t.i.d. p.r.n.  5. Multivitamin daily.  6. Omeprazole 20 mg b.i.d.  7. Oxybutynin chloride 5 mg b.i.d.  8. Trimethoprim 100 mg daily.  9. Valium 1 mg b.i.d. p.r.n.  10.Amitriptyline 100 mg q.h.s.  11.Nabumetone as directed.  12.Levaquin 500 mg daily x7 days.   CONSULTATIONS:  None.   BRIEF ADMISSION HISTORY AND PHYSICAL:  The patient is a 70 year old  female who presented to the hospital with weakness and fall.  She spent  the night in a chair because she was unable to move herself out of the  chair and subsequently called the ambulance the next day.  Although she  denied any specific fever or chills, she was noted to have a cough with  a change in mucus production.  Upon initial evaluation, she was found to  have a left lower lobe pneumonia and evidence of urinary tract infection  and therefore was admitted.   PROCEDURES AND DIAGNOSTIC STUDIES:  1. Chest x-ray on January 23, 2008 showed left retrocardiac opacity      suspicious for pneumonia.  2. Swallowing function evaluation on January 26, 2008 was normal with      no signs of aspiration or  penetration.  3. Chest x-ray on January 28, 2008 showed radiographically improved      but not completely cleared left lower lobe pneumonia.   DISCHARGE LABORATORY VALUES:  Sodium 135, potassium 4.2, chloride 104,  bicarb 23, BUN 12, creatinine 0.89, glucose 106.  White blood cell count  was 7.6, hemoglobin 11.2, hematocrit 34.5, platelets 385.   HOSPITAL COURSE BY PROBLEM:  1. Left lower lobe pneumonia:  The patient was admitted and      empirically put on a combination of Rocephin and azithromycin.  Her      elevated white blood cell count normalized.  The patient remained      afebrile with increasing energy throughout the course of her      hospital stay.  This time, she feels back to her baseline.  Her      antibiotics have been transitioned over to p.o. Levaquin.  She will      complete an additional 7 days of treatment with Levaquin as an      outpatient.  2. Coagulase negative Staphylococcus urinary tract infection:  The      patient did have antibiotics as noted above.  A follow-up urine      culture was done yesterday and so far is showing no growth.  3. Multiple sclerosis:  The patient was continued on her usual      outpatient regimen.  4. Constipation:  The patient was put on Senokot, Dulcolax, and given      a Fleet's enema to address this.  5. Gastroesophageal reflux disease/hiatal hernia:  The patient was      maintained on proton pump inhibitor therapy.  6. Questionable dysphagia:  The patient did have a swallowing      evaluation done which was normal.   DISPOSITION:  The patient is medically stable and will be discharged  home.  We will set up home physical therapy for her. She is instructed  to follow up with her primary care physician in 1-2 weeks.      Hillery Aldo, M.D.  Electronically Signed     CR/MEDQ  D:  01/29/2008  T:  01/30/2008  Job:  [id number]   cc:   Marcene Duos, M.D.

## 2011-05-11 NOTE — Discharge Summary (Signed)
[name], [name]               ACCOUNT NO.:  [id number]   MEDICAL RECORD NO.:  [id number]          PATIENT TYPE:  INP   LOCATION:  1610                         FACILITY:  [facility]   PHYSICIAN:  Michaelyn Barter, M.D. DATE OF BIRTH:  [birth year]   DATE OF ADMISSION:  01/07/2007  DATE OF DISCHARGE:  01/10/2007                               DISCHARGE SUMMARY   PRIMARY CARE PHYSICIAN:  Dr. Marcene Duos at [facility].   FINAL DIAGNOSES:  1. Pyelonephritis secondary to Escherichia coli.  2. Nausea and vomiting.  3. Dehydration.  4. Tachycardia.  5. Falling.  6. Bilateral foot pain.  7. Anemia.   PROCEDURES:  1. Chest x-ray completed January 07, 2007.  2. X-ray of the left foot, January 10, 2007.  3. X-ray of the right foot, January 10, 2007.  4. Left ankle x-ray, January 10, 2007.  5. Right ankle x-ray, January 09, 2007.  6. Bilateral ankle brachial index studies.   HISTORY OF PRESENT ILLNESS:  [name] is a 70 year old female with a  past medical history of multiple sclerosis who arrives with the chief  complaint of generalized weakness and vomiting. She stated that her  symptoms had started 2 days prior to this admission. She began to vomit  the night prior to admission and had at least  7 to 8 episodes over the 24 hours leading up this admission. There was  no coffee ground emesis, or bright red blood in her emesis. She denied  having any abdominal pain or diarrhea. She did complain of some  generalized weakness and stated that she is generally weak secondary to  her multiple sclerosis. She slid onto the floor and stated that she was  unable to get up secondary to being weak. She laid on the floor for  approximately 6 hours until her maintenance man found her on the floor.  EMS was called and the patient was brought to the hospital for further  evaluation.   PAST MEDICAL HISTORY:  Please see that dictated by Dr. Elliot Cousin.   HOSPITAL COURSE:   PROBLEM #1. Pyelonephritis. A urinalysis was  completed, it revealed the presence of pyelonephritis. The patient was  started on empiric ciprofloxacin. Urine cultures were obtained and  greater than 100,000 E. coli were determined to be present. This  organism was sensitive to ciprofloxacin therefore this was provided to  the patient throughout the course of her hospitalization. The patient  never complained of any urinary tract related symptoms. No dysuria, no  hematuria. She remained afebrile throughout the course of her  hospitalization.   PROBLEM #2. Nausea and vomiting. The source of this was questionable.  Patient may have had an acute bout of gastroenteritis, again she had no  abdominal pain during the course of her hospitalization. The patient was  provided with p.r.n. antiemetics and her symptoms resolved over the  course of her hospitalization.   PROBLEM #3. Acute on chronic weakness. The patient stated that at  baseline she has some weakness secondary to her multiple sclerosis. Her  strength did improve over the course of her  hospitalization. Physical  therapy and occupational therapy were consulted during the course of her  hospitalization. The patient refused to have any inpatient physical  therapy, and again stated that her strength had improved significantly  over the course of her hospitalization.   PROBLEM #4. Dehydration. This more than likely resulted from the  multiple bouts of emesis that the patient had. Aggressive IV fluid  hydration was provided to the patient and this resolved during the  course of her hospitalization.   PROBLEM#5. Anemia. The patient's hemoglobin at the time of her admission  was noted to have been 11.8 with an MCV of 74.9 which is concerning for  microcytic anemia. The patient's hemoglobin remained relatively stable  throughout the course of her hospitalization. It was decided that this  could be further worked up once the patient was  discharged from the  hospital by her primary care doctor.   PROBLEM #6. Tachycardia. The patient did have an episode of tachycardia  earlier on in the course of her hospitalization. However this resolved  by the date of discharge.   PROBLEM #7. Falling. The patient indicated that she had fallen prior to  coming to the hospital. Again, physical therapy was consulted. The  patient was provided with a cane. The option of inpatient physical  therapy was offered to the patient but the patient insisted that she  wanted to be discharged home.   PROBLEM #8. Bilateral foot pain. On the date of discharge the patient  indicated that she had been suffering from bilateral foot pain and  stated she has had it for quite a long time. I asked her to be a bit  more specific about how long she had suffered from this. She stated she  has had it for at least 6 months. She described the sensation as a  burning type of sensation. A uric acid level was checked on January the  18th and was found to be perfectly normal with a level of 3.9. X-rays  were taken of both of the patient's ankles, her right ankle on January  the 18th, there were no findings to explain the patient's burning ankle  pain. Likewise on January the 18th were no acute findings involving the  left ankle. X-rays of both left and right feet revealed no acute  findings. The patient also had an ankle brachial indices completed and  they were found to be normal. It was suggested again that the patient  would follow-up with her primary care physician for further work-up. The  patient also had chest x-rays completed during the course of this  hospitalization on January 07, 2007 the patient's chest x-ray revealed  low lung volumes accentuating basilar markings, no definite active  process was seen.   CONDITION ON THE DAY OF DISCHARGE:  On the date of discharge the patient indicated that she felt better. Her nausea and vomiting had resolved.  The  decision was made to discharge the patient home.   1. She was discharged home on ciprofloxacin 250 mg one tablet b.i.d.  2. Zofran one tablet q.8 hours p.r.n.  3. She was told to resume all of her previous medications, to avoid      falling, and to eat at least three meals each day, and to follow-up      with  Dr. Julieanne Manson at [facility] within 1-2 weeks.      Michaelyn Barter, M.D.  Electronically Signed     OR/MEDQ  D:  01/12/2007  T:  01/12/2007  Job:  [id number]   cc:   Marcene Duos, M.D.

## 2011-05-11 NOTE — Discharge Summary (Signed)
[name], [name]               ACCOUNT NO.:  [id number]   MEDICAL RECORD NO.:  [id number]          PATIENT TYPE:  INP   LOCATION:  6730                         FACILITY:  [facility]   PHYSICIAN:  Dellia Beckwith, M.D. DATE OF BIRTH:  1941-12-14   DATE OF ADMISSION:  02/24/2006  DATE OF DISCHARGE:                           DISCHARGE SUMMARY - REFERRING   DISCHARGE DIAGNOSES:  1.  Left lower lobe pneumonia most likely secondary to aspiration.  2.  History of erosive esophagitis with possible upper GI bleed during this      hospitalization.  3.  Iron-deficiency anemia.  4.  Multiple sclerosis.  5.  History of esophageal stricture with dilation in May of 2004.  6.  Degenerative joint disease and degenerative disc disease.  7.  Gastroesophageal reflux disease.  8.  History of depression.   DISCHARGE MEDICATIONS:  1.  Ambien CR 6.25 mg at bedtime.  2.  Zoloft 50 mg p.o. daily.  3.  Vitamin B6 200 mg p.o. daily.  4.  Gabapentin 100 mg p.o. t.i.d.  5.  Nexium 40 mg p.o. b.i.d.  6.  Oxybutynin 5 mg p.o. b.i.d.  7.  Amitriptyline 100 mg p.o. nightly.  8.  Betaseron 0.25 mg subcu every other day.  9.  Nystatin cream topical application in bilateral groins t.i.d. for two      weeks.  10. Avelox 400 mg daily until March 05, 2006.  11. Ferrous sulfate 325 mg p.o. b.i.d.  12. Tussionex 5 mL b.i.d. p.r.n.   PROCEDURES:  CT scan of chest, abdomen and pelvis was done on February 24, 2006, which showed left lower lobe consolidation as well as in the right  lower lobe, some mild opacifications which showed be followed in a few  months to confirm clearance, exclude malignancy.  There was also hiatal  hernia as well as a right ovarian cyst versus bladder diverticulum, uterine  fibroid and sigmoid diverticula.   DISPOSITION:  The patient has quickly deconditioned while she was in the  hospital and she will be discharged to the [facility] for a few days of physical  therapy before she can go home as  she lives alone.  On discharge from the  [facility], I would suggest to set up home health for physical therapy and  occupational therapy evaluation of her house and her primary physician, Dr.  Reche Dixon should follow up on this to make sure it is happening.  She will  follow up with her primary care physician, Dr. Reche Dixon, within a few weeks.   HISTORY AND PHYSICAL:  [name] is a 70 year old woman with a past  medical history of hiatal hernia with severe gastroesophageal reflux disease  as well as a past history of erosive esophagitis and also a past history of  aspiration pneumonia with hemoptysis.  She is also known for multiple  sclerosis.  She presented to the emergency department with new onset of  cough as well as what seemed to be either dark emesis or hemoptysis of dark  material.  She had been in her usual state of health until the evening prior  to admission when she became nauseated.  She awoke with several episodes of  spitting up black sputum and she was also, at that time, complaining of  generalized weakness.  She had no relief of her symptoms after taking two  Tums.  Of note, the patient had said to be taking on average six ibuprofen  everyday and had recently finished a course of three days of IV steroids on  February 22, 2006, for a flare of her multiple sclerosis.  She had some  subjective fevers, denied any chills.  She has also noted one episode of  melena since this has started.   PHYSICAL EXAMINATION:  GENERAL APPEARANCE:  She was an obese lady in no  acute distress.  VITAL SIGNS:  Temperature 102.4, blood pressure 101/49, pulse 118,  respiratory rate 24, oxygen saturation 95% on three liters.  HEENT:  She had dried blood on her lips and her tongue.  No mouth ulcers  were noted.  LYMPHS:  She had no cervical or supraclavicular adenopathies.  LUNGS:  Her lungs were clear on the right and she had questionable crackles  in the left lower lobe.  CARDIOVASCULAR:  Completely  normal.  ABDOMEN:  Completely normal including no epigastric tenderness.  Of note,  she had a fungal infection in her bilateral groins.  NEUROLOGIC:  Her cranial nerves were grossly intact.  Her strength was  decreased bilaterally but symmetric at 4/5.   LABORATORY DATA:  White count 19.9, ANC not available, hemoglobin 12.4, MCV  75, platelets 418.  Sodium 139, potassium 3.3, chloride 110, CO2 19, BUN 26,  creatinine 0.4, glucose 104.   A chest x-ray showed scarring versus an opacification in the left lower  lobe.   She tested positive for fecal occult blood on manual rectal exam.   HOSPITAL COURSE:  She was admitted for evaluation of this hemoptysis versus  GI bleed versus both.  The gastroenterologist, Dr. Christella Hartigan, was called and  felt that her acute bleed as well as fecal occult blood were most likely  secondary to hemoptysis and swallowing of blood.  He also said that this  might have been secondary to aspiration pneumonia as she was known in the  past to have had this same picture.  A CT scan of the abdomen, pelvis and  chest was done which confirmed a left lower lobe infiltrate as well as other  findings as noted above.  It was still unclear at that time if she had any  gastritis versus erosive esophagitis and some chronic upper GI bleed.  We  held her NSAIDs and started her on Protonix 40 mg p.o. b.i.d. as well as  started her Zosyn and azithromycin for treatment of her aspiration  pneumonia.  During the hospitalization, her hemoglobin went as low as 9.0  and on the day of discharge, her hemoglobin increased back up to 10.3.  Her  vital signs stayed stable throughout hospitalization.  On this point of  view, she is stable to be transferred to subacute care unit for  rehabilitation.  She was also started on iron 325 mg p.o. b.i.d.   Of note, the patient has become very quickly deconditioned secondary to her few days of staying in bed without getting up.  I have provided a walker  in  her room and suggest that she get a walker upon her return at home.  I also  suggest that after discharge from the subacute care unit, she get home  health physical therapy  and occupational therapy evaluation.   DISCHARGE LABORATORY DATA AND VITAL SIGNS:  White count 8.7, hemoglobin  10.3, hematocrit 32.4, platelets 479.  Temperature 98.7, pulse 95,  respirations 18 to 22 per minute, blood pressure 126/72, oxygen saturation  94% on room air.      Dellia Beckwith, M.D.     VD/MEDQ  D:  02/28/2006  T:  02/28/2006  Job:  [id number]   cc:   Dineen Kid. Reche Dixon, M.D.  Fax: [phone]

## 2011-05-11 NOTE — Discharge Summary (Signed)
[name] [name], [name] [name]               ACCOUNT NO.:  [id number]   MEDICAL RECORD NO.:  [id number]          PATIENT TYPE:  INP   LOCATION:  1318                         FACILITY:  [facility]   PHYSICIAN:  Isidor Holts, M.D.  DATE OF BIRTH:  January 13, 1941   DATE OF ADMISSION:  09/11/2006  DATE OF DISCHARGE:  09/16/2006                                 DISCHARGE SUMMARY   PRIMARY MEDICAL DOCTOR:  Dineen Kid. Reche Dixon, M.D.   PRIMARY NEUROLOGIST:  Gustavus Messing. Orlin Hilding, M.D.   DISCHARGE DIAGNOSIS:  1. Community-acquired pneumonia.  2. Severe gastroesophageal reflux disease, secondary to large hiatal      hernia.  3. Urinary tract infection, secondary to Enterococcus.  4. Multiple sclerosis.  5. History of depression.  6. History of chronic iron-deficiency anemia.  7. Constipation.   DISCHARGE MEDICATIONS:  1. Elavil 50 mg p.o. nightly.  2. Neurontin 600 mg p.o. t.i.d.  3. Pepcid 20 mg p.o. b.i.d.  4. Nexium 40 mg p.o. b.i.d.  5. Ditropan 5 mg p.o. b.i.d.  6. MiraLax 17 gm p.o. daily.  7. Senokot-S 1 pill p.o. nightly.  8. Avelox 400 mg p.o. daily from September 16, 2006, for 7 days only.   Note: Nabumetone has been stopped, due to severe gastroesophageal reflux  disease.   PROCEDURES:  1. Portable chest x-ray dated September 11, 2006, that showed progression      of airspace disease in the left midlung zone. This may represent      pneumonia.  2. Chest CT scan dated September 11, 2006. This showed new left upper lobe      and lingular infiltrates most consistent with pneumonia. Left lower      lobe airspace disease improved since March 2007 and right lower lobe      about the same. Thickening of the esophagus likely related to      esophagitis.  3. Two-view chest x-ray dated September 14, 2006. This showed improvement      in left lung pneumonia. There are some residual streaky densities in      the left posterior base compatible with subsegmental atelectasis.   CONSULTATIONS:  1.  Genene Churn. Love, M.D., Neurologist.  2. Speech pathologist.   ADMISSION HISTORY:  As in H&P note of September 01, 2006, dictated by Dr.  Jetty Duhamel. However, in brief, this is a 70 year old female, with known  history of multiple sclerosis under the care of Dr. Marcelino Freestone,  esophageal stricture, status post dilatation May 2007, large hiatal hernia  with severe gastroesophageal disease, and prior history of erosive  esophagitis, depression, sigmoid diverticulosis, chronic iron-deficiency  anemia, status post admission March 2007 and August 2007 for aspiration  pneumonia, who presents with progressive lower extremity weakness, general  debility, cough productive of thick, brownish colored phlegm, low-grade  fever. Initial evaluation showed a leukocytosis of 12.8. Chest x-ray  revealed left lung air space disease. She was admitted for further  evaluation, investigation, and management.   CLINICAL COURSE:  1. Community-acquired pneumonia. For presentation, refer to admission      history above. However, this is a patient who  has had episodes of      pneumonia in March and August 2007 deemed secondary to aspiration,      raising the suspicion of possible aspiration pneumonia on this      occasion. She was commenced initially on Zosyn, and speech pathology      evaluation was arranged. As initial chest x-ray showed findings      consistent with left lung airspace disease, which appeared to have been      progressive, raising the possibility of other underlying pathology,      chest CT scan was done and this demonstrated polylobar pulmonary      densities, for the most part left sided, with a small amount of      airspace disease in the right medial lung base. These areas showed air      bronchograms and no obvious mass effect. The most likely diagnosis was      deemed to be pneumonia. In addition, there was thickening of the      esophagus likely related to esophagitis. The patient  underwent speech      pathology evaluation on September 07, 2006. Modified barium swallow      examination showed a very normal swallow, although there was a      suspicion of possible aspiration secondary to reflux.  Regular diet was      recommended, as well as reflux precautions. Erythromycin was added to      the patient's antibiotic regimen because of the distinct possibility of      a community-acquired pneumonia. The patient has responded quite well to      above-mentioned treatment measures, and, by September 14, 2006, was      asymptomatic. The WBC has normalized and, as of September 15, 2006, was      6.5. The patient has remained afebrile during the course of her      hospitalization, is not short of breath, and certainly felt much      better. As a consequence, IV antibiotics have been discontinued on      September 15, 2006, after completion of the day's dosage. The patient      will be commenced on Avelox from September 16, 2006, p.o. to complete a      further 7-day course.   1. Enterococcal UTI. The patient was found to have a positive urinary      sediment, consistent with urinary tract infection. It was felt that the      above-mentioned antibiotic regimen would adequately cover this.      Subsequent urine culture demonstrated Enterococcus, sensitive to      ampicillin and the quinolones. Repeat urinalysis of September 14, 2006,      was negative consistent with resolution of infection. As of September 15, 2006, the patient had completed a 5-day course of Zosyn.   1. Severe gastroesophageal reflux disease. The patient has a known history      of large hiatal hernia/GERD and, in the past, has had erosive      esophagitis. As a matter of fact, at the time of presentation, she was      on a twice daily dosage of proton pump inhibitor. As mentioned above,     the patient has been evaluated by modified barium swallow examination      which showed no evidence of  aspiration. It is likely the patient's      recurrent pneumonias may be  secondary to aspiration, due to severe      reflux. H2RA has been added to her proton pump inhibition, with      satisfactory clinical effect. She has been strongly recommended to      observe reflux precautions.   1. Constipation. This was managed with appropriate bowel regimen, during      the course of the patient's hospitalization.   1. History of depression. The patient's mood remained stable throughout      her hospitalization, on Elavil.   1. Multiple sclerosis. The patient has been under regular followup with      Dr. Marcelino Freestone since 2002. At the time of this presentation, the      patient had increased weakness, particularly in the lower extremities      raising suspicion of possible flare of multiple sclerosis. As a matter      of fact, at some point, burst treatment with steroids was contemplated.      Dr. Avie Echevaria was called on neurology consultation and he recommended      managing underlying acute infective problems, utilizing physical      therapy/occupational therapy, and only to consider steroids, should      there be no improvement. Be that as it may, the patient clinically      improved with resolution of her acute problems. Underwent physical      therapy/occupational therapy while hospitalized and did very well with      this. By September 15, 2006, she was able to ambulate without      assistance.   1. History of chronic iron-deficiency anemia. Against a background of GERD      and diverticulosis. The patient's hemoglobin remained stable and      reasonable throughout the course of her hospitalization. As a matter of      fact, on September 15, 2006, hemoglobin was 10.9 with a hematocrit of      33.4.   DISPOSITION:  Provided no further acute issues supervene, it is anticipated  the patient will be discharged in satisfactory condition on September 16, 2006.   DIET:  Regular  diet, with appropriate reflux precautions.   ACTIVITY:  As tolerated, otherwise per PT/OT.   WOUND CARE:  Not applicable.   PAIN MANAGEMENT:  Not applicable.   FOLLOWUP INSTRUCTIONS:  1. The patient is to follow up with Dr. Donia Guiles, her PMD, at [facility], within 1 week. She has been instructed to call for an      appointment and has verbalized understanding.  2. She is to follow up routinely with her primary neurologist, Dr.      Marcelino Freestone, per prior scheduled appointment.   SPECIAL INSTRUCTIONS:  Home health aide/PT/OT has been arranged. We  recommend the patient's primary M.D. to arrange a followup chest x-ray in 1  week. We have also recommended that the patient avoid NSAIDs, as this may  exacerbate severe gastroesophageal reflux disease. She may, however, use  Tylenol extra strength over the counter for pain.      Isidor Holts, M.D. Electronically Signed     CO/MEDQ  D:  09/15/2006  T:  09/16/2006  Job:  [id number]   cc:   Dineen Kid. Reche Dixon, M.D.  Fax: [phone]   Gustavus Messing. Orlin Hilding, M.D.  Fax: [phone]   Genene Churn. Love, M.D.  Fax: [phone]

## 2011-05-11 NOTE — H&P (Signed)
NAMESABRYNA, LAHM NO.:  [id number]   MEDICAL RECORD NO.:  [id number]          PATIENT TYPE:  EMS   LOCATION:  ED                           FACILITY:  [facility]   PHYSICIAN:  Elliot Cousin, M.D.    DATE OF BIRTH:  03-19-1941   DATE OF ADMISSION:  01/07/2007  DATE OF DISCHARGE:                              HISTORY & PHYSICAL   PRIMARY CARE PHYSICIAN:  [name], [facility].   CHIEF COMPLAINT:  Generalized weakness, vomiting, status post fall.   HISTORY OF PRESENT ILLNESS:  The patient is a 70 year old woman with a  past medical history significant for pneumonia, multiple sclerosis, and  gastroesophageal reflux disease, who presents to the emergency  department with a chief complaint of generalized weakness and vomiting.  Her symptoms started 2 days ago. She began vomiting late last night and  continued on into the early morning. She vomited approximately 7-8 times  over the past 24 hours. She denies coffee ground emesis or bright red  blood in her emesis. She denies abdominal pain or diarrhea. She has had  some generalized weakness but no focal weakness. She says that she is  generally weak from her multiple sclerosis however over the past 24  hours, she felt as if she had no energy. She has had some chills but no  subjective fever. She has had some low back pain greater on the right  than the left. She denies any painful urination. She denies chest pain  or shortness of breath. She denies headache. She denies any swelling in  her legs. On the way back to her bedroom, she slid on the floor. She  was unable to get up. She had no preceding dizziness, headache,  palpitations, chest pain or shortness of breath. She laid on the floor  for approximately 6 hours until her maintenance man came in and found  her on the floor. At that time EMS was called. The patient had no loss  of consciousness.   During the evaluation in the emergency department, the  patient is noted  to be afebrile and hemodynamically stable. Her chest x-ray reveals low  lung volumes but no acute disease. Her white blood cell count is  elevated at 14.8 thousand. Her urinalysis is positive for moderate  leukocyte esterase, 21-50 wbc's, and many bacteria. The patient will be  admitted for further evaluation and management.   PAST MEDICAL HISTORY:  1. Pneumonia in September of 2007, August of 2007, and March of 2007.  2. Enterococcus urinary tract infection in September of 2007.  3. Multiple sclerosis, diagnosed in 2002. She is followed by      neurologist, Dr. Orlin Hilding.  4. Esophageal stricture status post dilatation in May of 2004 by Dr.      Marina Goodell.  5. Large hiatal hernia with severe gastroesophageal reflux disease and      a history of erosive esophagitis.  6. Status post cholecystectomy in 1970.  7. Sigmoid diverticulosis per CT scan in March of 2007.  8. Chronic constipation.  9. Depression.  10.Obesity.  11.Chronic iron-deficiency anemia.  ALLERGIES:  No known drug allergies.   FAMILY HISTORY:  Her father is deceased, however the etiology of his  death is unknown. Her mother is 70 years of age and the state of her  health is unknown.   SOCIAL HISTORY:  The patient lives alone in [place], [place].  She has two children, one son and one daughter who live in town and  assist her on a regular basis. She is retired. She denies tobacco,  alcohol and illicit drug use. She ambulates with a walker.   REVIEW OF SYSTEMS:  As above in the history of present illness.  Otherwise review of systems is negative.   PHYSICAL EXAMINATION:  VITAL SIGNS: Temperature 98.6, blood pressure  148/63, pulse 112, respiratory rate 25, oxygen saturation 94% on room  air.  GENERAL: The patient is a pleasant, alert,  obese 70 year old Caucasian  woman who is currently lying in bed in no acute distress.  HEENT: Head is normocephalic, atraumatic. Pupils are equal, round  and  reactive to light. Extraocular movements are intact, conjunctivae are  clear, sclerae are white. Nasal mucosa is dry, no sinus tenderness.  Oropharynx reveals fair dentition. Mucous membranes are mildly dry. No  posterior exudates or erythema.  NECK: Supple, no adenopathy, no thyromegaly, no bruit, no JVD.  LUNGS: Occasional wheeze auscultated in the upper lobes, otherwise  clear.  HEART: S1, S2 with no murmurs, rubs, or gallops.  ABDOMEN: Obese, positive bowel sounds, soft, nontender, nondistended, no  hepatosplenomegaly, no masses palpated.  MUSCULOSKELETAL/BACK: There is minimal tenderness to palpation over the  lumbosacral muscles.  EXTREMITIES: Pedal pulses 2+. No pretibial edema and no pedal edema. The  patient is able to raise each leg against gravity approximately 20  degrees. She has excellent range of motion of her upper extremities  bilaterally. No acute joint findings. No obvious ecchymosis or erythema  of her extremities.  NEUROLOGIC: The patient is alert and oriented x3. Cranial nerves II-XII  are intact.   ADMISSION LABORATORIES:  EKG reveals sinus tachycardia with a heart rate  of 114 beats per minute, otherwise no acute changes. Chest x-ray reveals  low lung volumes and no definite acute findings. Urinalysis is positive  for 30 protein, moderate leukocyte esterase, 21-50 wbc's, many bacteria  and 0-2 rbc's. Sodium 141, potassium 4.2, chloride 111, CO2 22, glucose  110, BUN 9, creatinine 0.7, calcium 9, total protein 7, albumin 3.6, AST  31, ALT 23. CK 71. WBC 14.8 thousand, hemoglobin  11.8, MCV 74.9,  platelets 430,000.   ASSESSMENT:  1. Urinary tract infection, probably pyelonephritis. The patient has      had generalized weakness and vomiting, more consistent with      pyelonephritis rather than a simple cystitis. Her white blood cell      count is modestly elevated however she is afebrile. She warrants     admission for intravenous antibiotics.  2. Status  post fall. The patient denies any injuries. Neurologically      she is intact. There are no obvious musculoskeletal findings with      exception of mild lumbosacral tenderness.  3. Chronic microcytic anemia. The patient's hemoglobin is 11.8 and her      MCV is 74.9. She has a long history of iron-deficiency anemia.  4. Multiple sclerosis, diagnosed in 2002.  5. Tachycardia. The patient's tachycardia is probably secondary to      volume depletion.   PLAN:  1. The patient will admitted for further evaluation and management.  2. Will culture the urine.  3. Will start IV fluids for volume repletion.  4. Will start intravenous Cipro for treatment of the pyelonephritis.  5. Supportive treatment including antiemetics. Oxygen therapy.      Prophylactic Lovenox and Protonix.  6. Will order PT/OT consultations.  7. Add multivitamin with iron.      Elliot Cousin, M.D.  Electronically Signed     DF/MEDQ  D:  01/07/2007  T:  01/07/2007  Job:  [id number]   cc:   Marcene Duos, M.D.   Catherine A. Orlin Hilding, M.D.  Fax: [phone]

## 2011-05-11 NOTE — Discharge Summary (Signed)
NAME:  [name] [name], [name] [name]                         ACCOUNT NO.:  [id number]   MEDICAL RECORD NO.:  [id number]                   PATIENT TYPE:  INP   LOCATION:  5727                                 FACILITY:  [facility]   PHYSICIAN:  Alvester Morin, M.D.               DATE OF BIRTH:  March 31, 1941   DATE OF ADMISSION:  08/29/2004  DATE OF DISCHARGE:  09/01/2004                                 DISCHARGE SUMMARY   DISCHARGE DIAGNOSIS:  1.  Multiple sclerosis.  2.  Urinary tract infection.  3.  Pneumonia.  4.  Herpes zoster.  5.  Gastroesophageal reflux disease.  6.  Chronic cellulitis.  7.  Depression.   DISPOSITION AND FOLLOW UP:  [name] [name] was discharged in stable condition.  She has a follow up appointment with her neurologist, Dr. Marcelino Freestone,  on September 15, 2004, at 9 a.m. and her primary care physician, Dr.  Donia Guiles, on September 08, 2004, at 2 p.m.   DISCHARGE MEDICATIONS:  1.  Doxycycline 100 mg p.o. b.i.d. x 10 days.  2.  Betaferon 0.3 mg (9.6 million international units) subcutaneous every      other day.  3.  Amitriptyline 100 mg p.o. q.h.s.  4.  Protonix 80 mg p.o. daily.  5.  Oxybutynin 5 mg p.o. b.i.d.  6.  Famvir 500 mg p.o. b.i.d.  7.  Lasix 20 mg p.o. daily.  8.  Naproxen 500 mg p.o. q.12h. p.r.n. pain.  9.  Percocet q.4h. p.o. p.r.n. pain.   ADMISSION HISTORY AND PHYSICAL:  Ms.  Dahlila [name] is a 70 year old white  female with a history of multiple sclerosis who was admitted for a two day  history of increasing weakness, especially on her left side, upper and lower  extremities, numbness in her left hand, and pain in bilateral feet radiating  up bilateral legs.  The pain is burning in nature and worse in the left  foot.  She saw Dr. Orlin Hilding approximately 3-4 weeks ago who put her on a  prednisone taper x 12 days.  She reports minimal improvement after this  prednisone taper.  She suffered a Herpes zoster outbreak shortly after  tapering the  prednisone.  The morning of admission, the patient woke up with  nausea and vomiting.  She reports that her emesis was bilious and not bloody  in nature.  She reported increased urinary frequency and dysuria with  urinary incontinence.  She denied shortness of breath, cough, dyspnea,  fever, and chills.  However, diffuse fine crackles were found on physical  exam in her lung fields, left greater than right.  Her left upper and lower  extremities had 4/5 strength.  Her right upper  and lower extremities had  5/5 strength.   ADMISSION LABORATORY DATA:  Sodium 137, potassium 4.2, chloride 106,  bicarbonate 23, BUN 12, creatinine 0.8, glucose 110, white blood count 16,  hemoglobin 12.3, hematocrit  37.3, platelets 400, ANC 13.5.  Her urinalysis  showed 11 to 20 white blood cells per high powered field, 7-10 red blood  cells per high powered field, many bacteria, and was nitrite positive.   HOSPITAL COURSE:  Problem 1:  Multiple infections.  Ms. Thuman was started on Cipro 400 mg  q.12h. for her active UTI and Rocephin 1 gram daily for her cellulitis.  When her admission chest x-ray suggested a left lower lobe pneumonia, which  later became bilateral opacities, she was switched to Imipenem/Simvastatin  500 mg q.6h.  Considering that her cellulitis was more than a year old, it  was decided to tailor her treatment towards her more acute infection.  She  was given a ten day supply of Doxycycline prior to discharge.   Problem 2:  MS flare.  Ms. Creedon had increased strength and was able to  ambulate after antibiotic treatment was begun.  It was felt that her  multiple infections greatly contributed to a weakness and that she would  show further improvement as her infections cleared.  For this reason, we did  not alter her multiple sclerosis therapy and defer treatment of this flare  to her neurologist, Dr. Marcelino Freestone.   DISCHARGE LABORATORY DATA:  One day prior to discharge, her labs were  sodium  141, potassium 3.8, chloride 116, bicarbonate 21, BUN 8, creatinine 0.8,  glucose 119, calcium 8.7.  On the day of discharge, her white blood count  was 95, hemoglobin and hematocrit were 10.3 and 30.8.  Her platelet count  was 420.      Octavia Heir                                Alvester Morin, M.D.    RD/MEDQ  D:  09/05/2004  T:  09/05/2004  Job:  [id number]   cc:   Santina Evans A. Orlin Hilding, M.D.  [address]  [address]  [place]  [place] [address]  Fax: [phone]   Dineen Kid. Reche Dixon, M.D.  [address] [place]  [place] [address]  Fax: [phone]

## 2011-05-11 NOTE — H&P (Signed)
[name] [name], [name] [name]               ACCOUNT NO.:  [id number]   MEDICAL RECORD NO.:  [id number]          PATIENT TYPE:  INP   LOCATION:  1318                         FACILITY:  [facility]   PHYSICIAN:  Lonia Blood, M.D.DATE OF BIRTH:  1940/12/31   DATE OF ADMISSION:  09/11/2006  DATE OF DISCHARGE:                                HISTORY & PHYSICAL   PRIMARY CARE PHYSICIAN:  Dr. Donia Guiles, [facility]/unassigned.   CHIEF COMPLAINT:  Severe weakness.   HISTORY OF PRESENT ILLNESS:  [name] [name] is a 70 year old female  with known multiple sclerosis.  She lives by herself and usually is able to  attend to herself.  Over the last week however, she has suffered with  progressive weakness primarily in the lower extremities.  She is normally  able to walk about her house.  She has felt that she gives out much quicker  over the last week.  Over the last 2 days specifically the patient had been  so weak she has not been able to get out of bed.  She has simply had to  slide onto the floor and essentially crawl about.  Over the last 24 hours  she has not even been able to do this.  This has been associated with  frequent cough.  The patient does not have a baseline chronic cough.  This  cough has been intermittently productive of thick brownish colored sputum.  She feels that she may have had a low-grade subjective fever but has not  measured it.  There has been no significant shortness of breath.  There has  been a decrease in appetite and decrease in p.o. intake.  There has been no  specific nausea or vomiting.  The patient does have severe reflux symptoms  and she reports that these have continued.  She has not changed any of her  MS medications and has been taking them on a regular basis as prescribed.  Of note, she was hospitalized for the same in March 2007 and also again in  August 2007.  Recent studies have included a barium esophagram in August  2007 which revealed a large  hiatal hernia, a moderate esophageal dysmotility  and erosive esophagitis but no evidence of a stricture.   REVIEW OF SYSTEMS:  Comprehensive review of systems is unremarkable with the  exceptions of multiple positive ailments noted in the history of present  illness above.   PAST MEDICAL HISTORY:  1. Multiple sclerosis diagnosed in 2002 followed by Dr. Jeannene Patella.  2. Admissions to the hospital for aspiration pneumonia March 2007 and      August 2007.  3. Esophageal stricture status post dilatation May 2004 by Dr. Marina Goodell at      [facility].  4. Large hiatal hernia with severe gastroesophageal reflux disease and      history of erosive esophagitis.  5. Depression.  6. Status post cholecystectomy in 1970.  7. Sigmoid diverticulosis via CT scan March 2007.  8. Chronic iron-deficiency anemia by old records.   OUTPATIENT MEDICATIONS:  1. Elavil 50 mg q.h.s.  2. Neurontin 600  mg t.i.d.  3. Relafen p.r.n. prescribed approximately 1 week ago.  4. Nexium 40 mg b.i.d.  5. Ditropan 5 mg b.i.d.  6. Betaseron 0.2 mg subcu q.o.d.   ALLERGIES:  NO KNOWN DRUG ALLERGIES.   FAMILY HISTORY:  Unknown.   SOCIAL HISTORY:  The patient lives in [place] and by herself.  She has  one son and one daughter who live in town who help her on a regular basis.  She is presently unemployed.  She does not smoke.  She does not drink.   DATA REVIEWED:  Sodium, potassium, chloride, bicarb, BUN and creatinine and  serum glucose are all normal.  Hemoglobin is low at 11.1 with an MCV that is  low at 76, white count is elevated at 12.8, platelet count is 334.  Chest x-  ray reveals diffuse left lung airspace disease with comment that  bronchoalveolar carcinoma cannot be ruled out.  To my view, this has  definitely progressed since her last exam in August.   PHYSICAL EXAMINATION:  VITAL SIGNS:  Temperature 99.1, blood pressure  130/70, heart rate 110, respiratory rate 20, O2 saturation is 95% on 2  liters per  minute nasal cannula.  GENERAL:  Well-developed, well-nourished female in no acute respiratory  distress.  HEENT:  Normocephalic, atraumatic.  Pupils equal, round, reactive to light  and accommodation.  Extraocular muscles intact bilaterally.  OC/OP clear.  NECK:  No JVD.  No lymphadenopathy.  LUNGS:  Diffuse left lung crackles worse in the base with good air movement  throughout all right fields without wheeze.  CARDIOVASCULAR:  Regular rate and rhythm without murmur, gallop or rub.  Normal S1 and S2.  ABDOMEN:  Mildly distended but soft.  Bowel sounds are positive.  There is  no mass.  There is no rebound.  The abdomen is nontender.  There is no  ascites.  EXTREMITIES:  No significant cyanosis, clubbing, edema bilateral lower  extremities.  NEUROLOGIC:  The patient is alert and oriented x4.  Cranial nerves II-XII  are intact bilaterally.  There is a Babinski to testing at bilateral lower  extremities.  The patient exhibits 4-/5 strength in bilateral lower  extremities and 4/5 strength in the left upper extremity and 5/5 strength in  the right upper extremity.  She has intact sensation to touch throughout.   IMPRESSION AND PLAN:  1. Left lung airspace disease/possible aspiration pneumonia.  It is not      clear here whether we are dealing with a pneumonia which is      exacerbating the patient's multiple sclerosis or multiple sclerosis      leading to dysphagia which is causing aspiration pneumonia.  The      patient has had a recent barium esophagram.  She has not however had a      formal swallowing test.  I will consult speech therapy and have them      evaluate her for the possibility of dysphagia.  I will maximize her      therapy for reflux disease.  We will add low dose Reglan empirically      initially.  The patient will be covered empirically with Zosyn for      aspiration.  To better evaluate the lung parenchyma, I will obtain a CT     scan of the chest as this has not been  done recently.  2. Multiple sclerosis.  The patient is having a significant flare of her      multiple  sclerosis.  I do not wish to lightly place the patient on a      rapid steroid taper.  In that the patient is followed on a regular      basis by a local neurologist, I will ask that they see her in      consultation and advise [place] as to the most appropriate pharmaceutical      treatment for the patient's acute multiple sclerosis flare.  3. Severe reflux disease.  Even if the patient does not have a significant      oral or pharyngeal dysphagia she is clearly at risk for ongoing      aspiration due to severe reflux disease.  I recommend that we maximize      her reflux treatment with both a proton pump inhibitor as well as an H2      blocker.  We would also add Reglan and we will continue this medication      as long as she is able to tolerate it without complications.  4. Normocytic anemia.  This is likely secondary to chronic      gastrointestinal loss due to erosive esophagitis.  I do      not find that the patient has had a colonoscopy.  At this age      colonoscopy would be advisable.  Prior to her discharge, we will advise      that she should follow up with Dr. Marina Goodell and consider a routine      screening outpatient colonoscopy.  For now, we will follow her      hemoglobin closely.      Lonia Blood, M.D.  Electronically Signed     JTM/MEDQ  D:  09/11/2006  T:  09/11/2006  Job:  [id number]   cc:   Dineen Kid. Reche Dixon, M.D.  Fax: [phone]   Gustavus Messing. Orlin Hilding, M.D.  Fax: [phone]

## 2011-05-11 NOTE — Consult Note (Signed)
NAMELEANETTE, EUTSLER               ACCOUNT NO.:  [id number]   MEDICAL RECORD NO.:  [id number]          PATIENT TYPE:  INP   LOCATION:  1318                         FACILITY:  [facility]   PHYSICIAN:  Genene Churn. Love, M.D.    DATE OF BIRTH:  11-Jul-1941   DATE OF CONSULTATION:  09/11/2006  DATE OF DISCHARGE:                                   CONSULTATION   This 70 year old, right-handed, white female is seen at the request of Dr.  Sharon Seller for evaluation of multiple sclerosis and recurrent aspiration  pneumonia.   HISTORY OF PRESENT ILLNESS:  Ms. Brame has a past history over 15 years ago  of feeling generally weak, evaluated by Drs. Buzzy Han and Fulshear.  In the  year 2001, she was diagnosed as having multiple sclerosis, apparently by  MRI, and was placed on Betaseron therapy which she has been on ever since.  She has waxing and waning of her abilities which are characterized primarily  by gait disorder and occasionally double vision.  In the last five years,  she has had four admissions for aspiration pneumonia.  August 2007 and March  2007 were her last two.  She coughs when she eats.  She was in her usual  state of health and on Monday saw Dr. Charlett Blake for evaluation of lower back  pain, was placed on nabumetone which is Relafen and began getting generally  weaker.  Today, she developed a temperature of 101.9 degrees, was seen in  the emergency room with chest x-ray showing left lung pneumonia and was  admitted for further evaluation.  She walks with a walker or a cane.  She is  independent in her activities of daily living including feeding herself,  bathing herself, taking care of toilet needs, cooking and shopping.  She  does not have bowel or bladder incontinence and has not had any episodes of  single-eye visual loss.  She does occasionally have double vision.   PAST MEDICAL HISTORY:  Significant for upper GI difficulties with gastritis.  She has a known history of an esophageal  stricture.  She has been followed  by Dr. Yancey Flemings and was last scoped several months ago for this disorder.   MEDICATIONS:  Nabumetone, Elavil, Neurontin, Nexium, Ditropan and Betaseron.   PHYSICAL EXAMINATION:  Examination revealed a well-developed, obese, white  female.  Blood pressure right arm 130/80 and left arm 140/80 and heart rate  of 64.  There were no bruits.  Mental status revealed that she was alert and  oriented x3.  Her MMSE was 27 out of 30.  There is no evidence aphasia.  Her  cranial nerve examination revealed acuity of 20/30 in the right eye and  20/25 in the left with glasses.  She had a right ptosis.  No  __________ .  Except for the ptosis, the face was symmetric.  The tongue was midline.  The  gags were present.  Sternocleidomastoid and trapezius testing were normal.  Motor strength was at least 4 out of 5 in the upper extremities with 2-3/5  proximally in lower extremities at 3/5 distally  in lower extremities.  Sensory examination with decreased pinprick on the left half of her body.  Deep tendon reflexes were 1 to 2+ in the upper extremities and 3+ in the  lower extremities.  Upgoing plantar response is noted.   LABORATORY DATA:  Laboratory data revealed chest x-ray showing evidence of  left lower lobe pneumonia.  Her white blood cell count was 12,800 with  hemoglobin 11.1, hematocrit 33.8 and platelet count 334,000.  PTT was 52  which is elevated.  INR was 1.1.  Sodium 140, potassium 4.1, chloride 110,  CO2 content 24, glucose 110, BUN 18, creatinine 0.9.  Her liver function  tests were normal except for SGPT which was slightly elevated with a value  of 46 and albumin low at 3.1.  Urinalysis showed moderate leukocytes and 7-  10 white blood cells.  Her CK was 39 (she has noted some difficulty recently  with increased frequency.)  Gait was not evaluated.   IMPRESSIONS:  1. Multiple sclerosis (code 340) with temperature 101.9, possibly      representing  syndrome __________  versus exacerbation.  2. Aspiration pneumonia secondary to dysphagia.  3. Urinary tract infection, code 599.0.   PLAN:  Plan at this time is to hold steroids.  Obtain OT and PT.  Treat her  in underlying infections before considering a trial of steroids.           ______________________________  Genene Churn. Sandria Manly, M.D.     JML/MEDQ  D:  09/11/2006  T:  09/13/2006  Job:  [id number]

## 2011-05-11 NOTE — Discharge Summary (Signed)
[name] [name], CARA               ACCOUNT NO.:  [id number]   MEDICAL RECORD NO.:  [id number]          PATIENT TYPE:  INP   LOCATION:  5703                         FACILITY:  [facility]   PHYSICIAN:  Rolm Gala, M.D.    DATE OF BIRTH:  Jun 18, 1941   DATE OF ADMISSION:  07/25/2006  DATE OF DISCHARGE:  07/28/2006                                 DISCHARGE SUMMARY   CONSULTS:  None.   PROCEDURES:  A barium swallow study showed a moderate reflux with a hiatal  hernia.  An esophageal dysmotility.  No stricture.   REASON FOR ADMISSION:  This is a 70 year old female who was admitted for a  left lower lobe likely aspiration pneumonia and a history of MS.   DISCHARGE DIAGNOSES:  1.  Aspiration pneumonia left lower lobe.  2.  Severe reflux confirmed by barium swallow study.  3.  Multiple sclerosis.  4.  Status post esophageal stricture dilation May of 2004.  5.  History of depression.  6.  Cholecystectomy in the 1970s.   DISCHARGE MEDICATIONS:  1.  Augmentin 875 mg take one p.o. daily for 10 more days (total of 14      days).  2.  Nexium 40 mg take one p.o. b.i.d.  3.  Ditropan 5 mg take one p.o. b.i.d.  4.  Neurontin 600 mg take one p.o. 3 times a day, this is a new dose.  5.  Betaseron 0.2 mg take subcu every other day.  6.  Stop your amitriptyline.   DISPOSITION:  Patient will be discharged home to a wheelchair friendly where  she lives.  She will have home health PT at home.  Patient lives by herself,  but has a daughter and a son nearby who help her often.   DISCHARGE FOLLOWUP:  1.  Patient was told to make an appointment, call early Monday morning to      make an appointment with Dr. Marina Goodell with [facility] as soon as possible.  2.  Patient is to followup with Dr. Dennie Fetters as previously scheduled.  3.  Followup issues:  Patient will need to be followed up with Dr. Marina Goodell      about her hiatal hernia and her esophageal dysmotility.  We increased      her Neurontin and stopped her  Elavil; her pain will need to be      reassessed after she is on the new medication regimen in a few weeks.      Blood cultures from July 25, 2006 are no growth to date and will not be      final until July 30, 2006.   DISCHARGE CONDITION:  Patient is stable and improving.  She has clear  sensorium.  Her weakness has resolved to baseline.  She is on room air with  saturations 97% to 100%.   DISCHARGE LABS:  Electrolytes were normal on July 28, 2006.  A CBC on  admission showed a white count of 11.1, which is down to 6.4 on day of  discharge.  Her hemoglobin was 11.8 with an MCV of 75.6.  Patient's  urinalysis was normal.  Her blood cultures showed no growth to date.  A  ferritin was normal at 39.   PENDING LABS:  Blood cultures are not yet final.   HOSPITAL COURSE:  1.  Aspiration pneumonia left lower lobe, likely secondary to severe reflux.      Patient was initially started on Zosyn and azithromycin for this      problem.  She was switched over to Augmentin greater than 24 hours prior      to discharge and had no fevers.  She had new weakness that quickly      resolved to baseline as her pneumonia improved.  Patient had vomiting      and cough prior to discharge and these have improved.  She did not need      O2 during her hospital stay.  Repeat chest x-ray may be done at primary      MDs discretion.  2.  Severe reflux.  Patient had a speech eval that showed no problems with      initial swallow, but given the severity of her reflux symptoms, we      ordered a barium swallow study, which showed as above.  Patient was      continued on her Nexium 40 mg p.o. b.i.d.  At discharge she was told to avoid caffeine, no snacks after 10 p.m.,  elevate the head of the bed.  No mints and keep tomatoes and citrus to a  minimum.  1.  Bladder incontinence.  This is probably related to multiple sclerosis.      A UA was negative and she did not complain of any urinary symptoms, but       continued on her home dose of oxybutynin.  2.  Multiple sclerosis.  Patient came in weak with more weakness than her      baseline, but this quickly resolved as her pneumonia grew better.  On      her last admission, she was discharged to Rehab Unit, but we do not feel      that this is necessary at this time.  We had a PT/OT consult and PT will      be following her at home for home PT.  3.  Chronic pain.  Patient has chronic pain in her legs and feet, which she      on admission and discharge, was at baseline.  Given concerns with      amitriptyline given her age, we increased her Neurontin and stopped the      amitriptyline dose.  4.  Recurrent aspiration.  Patient has a history of esophageal stricture and      this is her third pneumonia admission history.  With the barium swallow      study, we just believe that this may be due to severe reflux.      Rolm Gala, M.D.     Bennetta Laos  D:  07/28/2006  T:  07/28/2006  Job:  [id number]   cc:   [name]. Marina Goodell, M.D. [facility]  Dennie Fetters, M.D.

## 2011-05-11 NOTE — H&P (Signed)
NAMEMACARENA, LANGSETH               ACCOUNT NO.:  [id number]   MEDICAL RECORD NO.:  [id number]          PATIENT TYPE:  INP   LOCATION:  1824                         FACILITY:  [facility]   PHYSICIAN:  Pearlean Brownie, M.D.DATE OF BIRTH:  [birth year]   DATE OF ADMISSION:  07/25/2006  DATE OF DISCHARGE:                                HISTORY & PHYSICAL   CHIEF COMPLAINT:  Cough, vomiting.   HISTORY OF PRESENT ILLNESS:  Ms. Teresi is a 70 year old white female with a  history of MS diagnosed five years ago.  She also has a history of  aspiration pneumonia for which she was hospitalized in March, 2007.  She  started experiencing vomiting that was nonbloody and nonbilious  approximately four days ago.  The vomiting resolved after two days, but she  now complains of a cough that is not improving.  She has not had any fevers,  sweats, or chills.  She also denies current nausea and vomiting for the past  three days.  She also complains of increased weakness since the onset of  this illness, which is usual for her whenever she gets infections.  She  denies chest pain and shortness of breath.   REVIEW OF SYSTEMS:  The patient denies dysuria, hematemesis, melena,  abdominal pain, nausea, chest pain, shortness of breath, fevers, chills,  diarrhea, constipation, seizures, syncope, visual changes, hearing changes.  She does endorse headache, incontinence, cough as described above, and leg  and foot pain, which is a chronic issue for her.   PAST MEDICAL HISTORY:  1.  Multiple sclerosis diagnosed five years ago and followed by Dr. Orlin Hilding.  2.  Aspiration pneumonia, hospitalized in March, 2007 on the internal      medicine teaching service.  3.  Esophageal stricture, status post dilation in May, 2004.  Followed by      Dr. Marina Goodell of [facility].  4.  Gastroesophageal reflux disease.  5.  History of depression.  6.  Cholecystectomy in the 1970s.   The patient's primary care physician is [facility].   FAMILY HISTORY:  Patient is not sure of her family history.   SOCIAL HISTORY:  She lives alone in [place].  She has one son and one  daughter.  She used to work as a Neurosurgeon in an alteration shop, but she  is currently unemployed.  She denies tobacco, alcohol, and illicit drugs.   MEDICATIONS:  1.  Betaseron 0.3 mg subcu every other day.  2.  Oxybutynin 5 mg p.o. b.i.d.  3.  Neurontin 400 mg p.o. t.i.d.  4.  Nexium 40 mg p.o. b.i.d.  5.  Amitriptyline 100 mg p.o. nightly.   PHYSICAL EXAMINATION:  VITAL SIGNS:  T max 100.5, blood pressure 113/56,  heart rate 101, respirations 18, satting 95% on room air.  GENERAL:  The patient is lying in the bed.  Appears in no acute distress.  She has been incontinent of urine while on the stretcher.  HEENT:  Pupils are equal, round and reactive to light.  Extraocular  movements are intact.  Sclerae are white.  Conjunctivae are pink.  Mucous  membranes moist.  No lesions or exudates.  Poor dentition.  NECK:  Supple.  No lymphadenopathy.  No thyromegaly.  CARDIOVASCULAR:  Regular rate and rhythm with no murmur, rub or gallop.  LUNGS:  Scattered wheezes that clear with cough.  ABDOMEN:  Obese, soft, nontender, nondistended.  Active bowel sounds.  No  organomegaly.  EXTREMITIES:  No edema.  Dorsalis pedis pulses 2+ bilaterally.  NEURO:  Cranial nerves II-XII are grossly intact.  Strength is 4/5 in the  left upper extremity and 5/5 in the right upper extremity.  Strength is 2/5  in the lower extremities bilaterally.   LABORATORY RESULTS:  Sodium 138, potassium 4.1, chloride 106, bicarb 24, BUN  11, creatinine 0.8, glucose __________.  AST is 33, ALT 28, alkaline  phosphatase 100.  White blood cell count is 11.1 with 81% neutrophils,  hemoglobin 11.8, platelets 364.  Urinalysis is negative.  A blood culture is  pending at this time.   Chest x-ray was obtained that showed bibasilar atelectasis and a left lower  lobe infiltrate.    ASSESSMENT/PLAN:  Ms. Constantin is 70 year old with weakness secondary to  multiple sclerosis as well as a history of esophageal stricture.  She had a  one day history of emesis four days ago and has now started with progressive  cough and a temperature to 100.5 in the emergency department.  She had a  chest x-ray that showed a left lower lobe infiltrate.  Problem 1:  Left lower lobe pneumonia:  We will treat for aspiration  disease, although aspiration is less likely in the left lung.  She has a  history of vomiting and a history of esophageal stricture, which is  concerning for aspiration.  We will start azithromycin and Zosyn for  anaerobe coverage.  We will start IV fluids at 100 cc/hr and follow up blood  cultures in the morning.  She will receive Tylenol p.r.n. fever.  Problem 2:  Multiple sclerosis:  The patient is weak above her baseline.  She ambulates with a walker at home.  At her last admission in March, she  was discharged to the rehab unit for progressive weakness.  We will continue  her home dose of Betaseron and obtain a PT/OT consult.  Problem 3:  Bladder incontinence:  Possibly related to multiple sclerosis.  A urinalysis was negative.  She does not have urinary symptoms.  We will  continue her home dose of oxybutynin.  Problem 4:  Chronic pain:  Patient has pain in her legs and feet at  baseline.  She is on Neurontin and amitriptyline, which we will continue at  her home dose.  Problem 5:  Recurrent aspiration:  The patient has a history of esophageal  stricture and aspiration pneumonia x2.  We will consider a swallow study  and/or a GI consult.  Problem 6:  Peripheral access:  We will start SCDs for DVT prophylaxis as  well as Protonix for ulcer prophylaxis.     ______________________________  Sylvan Cheese, M.D.    ______________________________  Pearlean Brownie, M.D.    MJ/MEDQ  D:  07/25/2006  T:  07/25/2006  Job:  [id number]  cc:   [facility]

## 2011-05-11 NOTE — Discharge Summary (Signed)
NAMEDUBLIN, CANTERO               ACCOUNT NO.:  [id number]   MEDICAL RECORD NO.:  [id number]          PATIENT TYPE:  INP   LOCATION:  6730                         FACILITY:  [facility]   PHYSICIAN:  Dellia Beckwith, M.D. DATE OF BIRTH:  08/04/1941   DATE OF ADMISSION:  02/24/2006  DATE OF DISCHARGE:  03/01/2006                                 DISCHARGE SUMMARY   ADDENDUM:  Mrs. Veal had an order written for discharge to [facility] on February 28, 2006. Due to lack of available beds in the [place], Mrs. Zettler stayed on a  regular floor one more day. When seen on the morning of March 01, 2006, Mrs.  Kmetz had dramatic improvement in her deconditioning state and was able to  walk to the hallway and back with a walker with wheels without any problems.  She felt almost back to her baseline and safe enough to return home.   I will discharge her to home at this point with an order for home health,  physical therapy, and occupational therapy to evaluate and manage her at  home.   She will return to Dr. Reche Dixon on March 22 at 2 p.m.      Dellia Beckwith, M.D.     VD/MEDQ  D:  03/01/2006  T:  03/02/2006  Job:  [id number]   cc:   Dineen Kid. Reche Dixon, M.D.  Fax: [phone]

## 2011-05-11 NOTE — Assessment & Plan Note (Signed)
[facility]                         GASTROENTEROLOGY OFFICE NOTE   KYNLEI, PIONTEK                      MRN:          [id number]  DATE:02/27/2007                            DOB:          11/05/1941    HISTORY:  Bosie Clos presents today regarding recent problems with transient  food impaction and constipation.  She is a 70 year old with a history of  multiple sclerosis, degenerative joint disease, severe gastroesophageal  reflux disease complicated by erosive esophagitis and peptic stricture.  She also has a history of iron deficiency anemia secondary to erosive  esophagitis.  Finally, a history of adenomatous colon polyps.  She was  last seen in September 2006.  See that dictation for details.  Her last  upper endoscopy was performed in December 2006.  She underwent dilation  to a maximal diameter of 18 mm.  Her last colonoscopy was in October  2006.  It was negative for polyps.  She reports that she had been on  Nexium 40 mg b.i.d. with good control of symptoms.  However, due to  insurance formulary issues she is now on Prilosec 20 mg daily with  significant reflux symptoms.  Our nurse recommended twice-daily dosage  which has helped somewhat.  This past Monday while eating a hamburger at  [employer] she developed food impaction which lasted several  hours.  Eventually relieved after hiccupping.  She comes in for this  problem.  She also mentions to me chronic problems with constipation.  She is on no medication for her constipation.  She has no known drug  allergies.   CURRENT MEDICATIONS:  Include gabapentin, omeprazole, Betaseron,  amitriptyline, nabumetone, and multivitamin.   PHYSICAL EXAMINATION:  GENERAL:  Finds a slightly chronically-ill-  appearing female in no acute distress.  VITAL SIGNS:  Blood pressure is 144/88, heart rate 72, weight is 198  pounds.  HEENT:  Sclerae are anicteric, conjunctivae are pink, oral mucosa  intact,  no adenopathy.  LUNGS:  Clear.  HEART:  Regular.  ABDOMEN:  Soft without tenderness, mass or hernia.   IMPRESSION:  1. Gastroesophageal reflux disease complicated by erosive esophagitis      and peptic stricture.  A recent food impaction secondary to the      same.  2. Chronic constipation.  Functional.  3. History of adenomatous colon polyps.  Negative recent colonoscopy.   RECOMMENDATIONS:  1. Prescription for Nexium 40 mg daily.  2. MiraLax 17 g in 8 ounces of water daily.  3. Schedule upper endoscopy with esophageal dilatation.  The nature of      the procedure as      well as the risks, benefits and alternatives have been reviewed.      She understood and agreed to proceed.     [name]. Marina Goodell, MD  Electronically Signed    JNP/MedQ  DD: 02/27/2007  DT: 02/27/2007  Job #: [id number]   cc:   Gregary Signs A. Everardo All, MD

## 2011-05-11 NOTE — Discharge Summary (Signed)
NAME:  [name] [name], [name] [name]                         ACCOUNT NO.:  [id number]   MEDICAL RECORD NO.:  [id number]                   PATIENT TYPE:  INP   LOCATION:  0460                                 [facility]ACILITY:  [facility]   PHYSICIAN:  Rene Paci, M.D. [facility]          DATE OF BIRTH:  [birth year]   DATE OF ADMISSION:  04/29/2003  DATE OF DISCHARGE:  05/03/2003                                 DISCHARGE SUMMARY   DISCHARGE DIAGNOSES:  1. Nausea, vomiting secondary to esophageal stricture, resolved.  2. Esophageal stricture status post dilation May 7.  3. Erosive gastritis.  4. Upper gastrointestinal bleed with coffee ground emesis secondary to     above, resolved.  5. Iron deficiency anemia secondary to above status post 2 units packed red     blood cells transfusion, hemoglobin 10.3.  Hemodynamically stable.  6. Fever secondary to pneumonia, probable aspiration, improved.  Continue     outpatient antibiotics.  7. Multiple sclerosis with right-sided weakness diagnosed four years ago     followed by Santina Evans A. Orlin Hilding, M.D., stable.  8. 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  The patient to avoid NSAIDs including Cox-II inhibitors.   DISCHARGE MEDICATIONS:  1. Nexium 40 mg p.o. b.i.d.  2. Hemocyte Plus vitamin tablet with iron one tablet p.o. b.i.d.  3. Ceftin 500 mg p.o. b.i.d. x10 additional days.  4. Vicodin 5/500 one to two tablets q.6h. p.r.n. pain.  5. The patient may also continue Elavil 25 mg p.o. q.h.s. as prior to     admission.   The patient understands to avoid all NSAIDs including Cox-II inhibitors.  Hospital follow-up with be with [name]. Marina Goodell, M.D. [facility], her GI physician to  be arranged in approximately one month.  The patient has telephone number  and will call to arrange appointment.  She may also continue as scheduled  outpatient follow-up with her orthopedic doctor and neurologist as  previously scheduled.  At this time  patient has no primary care physician  but she is encouraged to call any of the  clinics to establish  primary care as at this time until she establishes she will continue to be  considered unassigned.   CONDITION ON DISCHARGE:  Medically stable and improved.   DISPOSITION:  The patient is discharged to home where she lives with her  husband and family.  No home health agencies are needed at this time.   HOSPITAL COURSE:  Problem 1 - NAUSEA, VOMITING, AND DYSPHAGIA:  The patient  is a pleasant woman with multiple sclerosis who has previous history of  esophageal strictures requiring dilation who presented to the emergency room  after two to three days of nausea, vomiting, and increasing difficulty  choking her food down.  She had recently begun with coffee ground like  emesis on the day prior to admission prompting ER evaluation.  GI was  immediately  consulted and the next day upper endoscopy was performed by [name]. Marina Goodell, M.D. [facility] confirming erosive gastritis likely causing the coffee  ground emesis.  There was also a stricture which was dilated at the time of  the procedure.  After the procedure patient had resolution of the dysphagia  and no recurrent nausea or vomiting.  The patient is to continue on b.i.d.  pro time pump inhibitor and follow up with GI as scheduled.  She is also to  avoid NSAIDs including Vioxx which had been prescribed for her  osteoarthritis.  She is given a short supply of Vicodin for her pain until  she can be reevaluated by her orthopedic physician.   Problem 2 - FEVER WITH LEFT LOWER LOBE INFILTRATE:  On presentation to the  emergency room patient had a fever of 101.5.  Urinalysis and urine culture  were negative.  O2 saturations were good on room air saturation but chest x-  ray did show left lower lobe atelectasis with possible effusion consistent  with possible pneumonia.  She was begun empirically at admission on IV  Rocephin.  As she is now  swallowing and tolerating p.o. medications and food  without difficulty, she will be changed to oral Ceftin to continue a 14-week  total course antibiotic treatment for possible aspiration pneumonia.  She  has been afebrile for greater than 27 hours since admission.  She was  encouraged to call any of the [place] primary care offices and reestablish  primary care and to follow up on this and her other chronic medical issues.   Problem 3 - OTHER MEDICAL ISSUES INCLUDING OSTEOARTHRITIS AND MULTIPLE  SCLEROSIS:  These were at her baseline and remained stable during this  hospitalization.  No other medication changes were made.                                               Rene Paci, M.D. [facility]    VL/MEDQ  D:  05/03/2003  T:  05/03/2003  Job:  [id number]

## 2011-09-13 LAB — EXPECTORATED SPUTUM ASSESSMENT W GRAM STAIN, RFLX TO RESP C

## 2011-09-13 LAB — COMPREHENSIVE METABOLIC PANEL
ALT: 21
AST: 22
AST: 27
Albumin: 3.1 — ABNORMAL LOW
Albumin: 3.1 — ABNORMAL LOW
Alkaline Phosphatase: 106
Calcium: 9
Chloride: 104
Creatinine, Ser: 0.74
GFR calc Af Amer: >60
GFR calc Af Amer: >60
Potassium: 4.1
Sodium: 137
Total Bilirubin: 0.7
Total Protein: 5.7 — ABNORMAL LOW

## 2011-09-13 LAB — URINE CULTURE: Colony Count: >=100000

## 2011-09-13 LAB — DIFFERENTIAL
Eosinophils Absolute: 0
Eosinophils Relative: 0
Lymphs Abs: 1.2
Monocytes Relative: 7

## 2011-09-13 LAB — CBC
MCHC: 33.2
Platelets: 333
Platelets: 340
RDW: 18.2 — ABNORMAL HIGH
WBC: 12.7 — ABNORMAL HIGH

## 2011-09-13 LAB — URINE MICROSCOPIC-ADD ON

## 2011-09-13 LAB — URINALYSIS, ROUTINE W REFLEX MICROSCOPIC
Protein, ur: NEGATIVE
Specific Gravity, Urine: 1.019
Urobilinogen, UA: 1

## 2011-09-13 LAB — CULTURE, BLOOD (ROUTINE X 2)

## 2011-09-14 LAB — URINALYSIS, ROUTINE W REFLEX MICROSCOPIC
Nitrite: NEGATIVE
Protein, ur: NEGATIVE
Specific Gravity, Urine: 1.017
Urobilinogen, UA: 1

## 2011-09-14 LAB — CBC
HCT: 33.2 — ABNORMAL LOW
HCT: 34.4 — ABNORMAL LOW
Hemoglobin: 11.3 — ABNORMAL LOW
MCHC: 32.5
MCHC: 32.8
MCV: 75 — ABNORMAL LOW
MCV: 75.2 — ABNORMAL LOW
Platelets: 323
Platelets: 385
RDW: 17.4 — ABNORMAL HIGH
RDW: 17.6 — ABNORMAL HIGH
RDW: 17.7 — ABNORMAL HIGH
WBC: 7.6

## 2011-09-14 LAB — BASIC METABOLIC PANEL
BUN: 12
BUN: 8
CO2: 23
Chloride: 104
Chloride: 107
Creatinine, Ser: 0.89
GFR calc non Af Amer: >60
GFR calc non Af Amer: >60
Glucose, Bld: 101 — ABNORMAL HIGH
Glucose, Bld: 106 — ABNORMAL HIGH
Glucose, Bld: 96
Potassium: 4.2
Potassium: 4.5
Sodium: 138

## 2011-09-14 LAB — URINE MICROSCOPIC-ADD ON

## 2011-09-14 LAB — URINE CULTURE: Culture: NO GROWTH

## 2011-09-14 LAB — CULTURE, RESPIRATORY W GRAM STAIN

## 2011-10-08 LAB — URINE CULTURE: Special Requests: POSITIVE

## 2011-10-08 LAB — BASIC METABOLIC PANEL
BUN: 13
CO2: 23
Calcium: 8.6
Calcium: 9
Calcium: 9.7
Creatinine, Ser: 0.8
GFR calc Af Amer: >60
GFR calc non Af Amer: >60
GFR calc non Af Amer: >60
GFR calc non Af Amer: >60
Glucose, Bld: 92
Glucose, Bld: 94
Glucose, Bld: 116 — ABNORMAL HIGH
Potassium: 4
Potassium: 4
Sodium: 140
Sodium: 141

## 2011-10-08 LAB — CBC
HCT: 25.6 — ABNORMAL LOW
HCT: 26.6 — ABNORMAL LOW
HCT: 27.4 — ABNORMAL LOW
HCT: 28.2 — ABNORMAL LOW
Hemoglobin: 8.6 — ABNORMAL LOW
Hemoglobin: 8.9 — ABNORMAL LOW
MCHC: 31.9
MCV: 68 — ABNORMAL LOW
MCV: 68.1 — ABNORMAL LOW
Platelets: 302
Platelets: 388
Platelets: 389
Platelets: 417 — ABNORMAL HIGH
Platelets: 420 — ABNORMAL HIGH
Platelets: 459 — ABNORMAL HIGH
RBC: 3.9
RDW: 17.3 — ABNORMAL HIGH
RDW: 17.4 — ABNORMAL HIGH
RDW: 17.4 — ABNORMAL HIGH
RDW: 15.8 — ABNORMAL HIGH
WBC: 5.2
WBC: 5.7
WBC: 7.5
WBC: 8.2
WBC: 11.6 — ABNORMAL HIGH
WBC: 12.5 — ABNORMAL HIGH

## 2011-10-08 LAB — CROSSMATCH: ABO/RH(D): O POS

## 2011-10-08 LAB — DIFFERENTIAL
Basophils Absolute: 0
Basophils Absolute: 0.4 — ABNORMAL HIGH
Basophils Relative: 0
Basophils Relative: 3 — ABNORMAL HIGH
Eosinophils Absolute: 0.2
Eosinophils Absolute: 0.1
Lymphocytes Relative: 32
Monocytes Absolute: 1.3 — ABNORMAL HIGH
Monocytes Relative: 10
Neutrophils Relative %: 54
Neutrophils Relative %: 78 — ABNORMAL HIGH

## 2011-10-08 LAB — COMPREHENSIVE METABOLIC PANEL
ALT: 34
AST: 44 — ABNORMAL HIGH
Albumin: 2.9 — ABNORMAL LOW
Albumin: 2.9 — ABNORMAL LOW
Alkaline Phosphatase: 89
Alkaline Phosphatase: 96
BUN: 11
BUN: 9
CO2: 26
Chloride: 105
Chloride: 108
Creatinine, Ser: 0.84
GFR calc non Af Amer: >60
Glucose, Bld: 107 — ABNORMAL HIGH
Potassium: 4
Potassium: 4.2
Sodium: 139
Total Bilirubin: 0.5
Total Bilirubin: 0.6
Total Protein: 5.8 — ABNORMAL LOW

## 2011-10-08 LAB — GASTRIC OCCULT BLOOD (1-CARD TO LAB)
Occult Blood, Gastric: POSITIVE — AB
pH, Gastric: 5

## 2011-10-08 LAB — POCT CARDIAC MARKERS
CKMB, poc: 2.5
Myoglobin, poc: 91.5
Operator id: 1627
Operator id: 4295
Troponin i, poc: <0.05

## 2011-10-08 LAB — URINALYSIS, MICROSCOPIC ONLY
Glucose, UA: NEGATIVE
Ketones, ur: NEGATIVE
Leukocytes, UA: NEGATIVE
Nitrite: NEGATIVE
Protein, ur: NEGATIVE
Urobilinogen, UA: 1

## 2011-10-08 LAB — HEMOGLOBIN AND HEMATOCRIT, BLOOD
HCT: 29.6 — ABNORMAL LOW
Hemoglobin: 8.2 — ABNORMAL LOW

## 2011-10-08 LAB — CULTURE, BLOOD (ROUTINE X 2)

## 2011-10-08 LAB — B-NATRIURETIC PEPTIDE (CONVERTED LAB): Pro B Natriuretic peptide (BNP): <30

## 2011-10-08 LAB — PROTIME-INR: Prothrombin Time: 12.7

## 2011-12-16 ENCOUNTER — Encounter: Payer: Self-pay | Admitting: *Deleted

## 2011-12-16 ENCOUNTER — Emergency Department (HOSPITAL_COMMUNITY)
Admission: EM | Admit: 2011-12-16 | Discharge: 2011-12-16 | Disposition: A | Discharge status: Home / Self Care | Payer: Medicare (Managed Care) | Attending: Emergency Medicine | Admitting: Emergency Medicine

## 2011-12-16 DIAGNOSIS — W261XXA Contact with sword or dagger, initial encounter: Secondary | ICD-10-CM | POA: Insufficient documentation

## 2011-12-16 DIAGNOSIS — S71009A Unspecified open wound, unspecified hip, initial encounter: Secondary | ICD-10-CM | POA: Insufficient documentation

## 2011-12-16 DIAGNOSIS — S71119A Laceration without foreign body, unspecified thigh, initial encounter: Secondary | ICD-10-CM

## 2011-12-16 DIAGNOSIS — W260XXA Contact with knife, initial encounter: Secondary | ICD-10-CM | POA: Insufficient documentation

## 2011-12-16 DIAGNOSIS — S71109A Unspecified open wound, unspecified thigh, initial encounter: Secondary | ICD-10-CM | POA: Insufficient documentation

## 2011-12-16 HISTORY — DX: Multiple sclerosis: G35

## 2011-12-16 HISTORY — DX: Gastro-esophageal reflux disease without esophagitis: K21.9

## 2011-12-16 HISTORY — DX: Depression, unspecified: F32.A

## 2011-12-16 HISTORY — DX: Major depressive disorder, single episode, unspecified: F32.9

## 2011-12-16 MED ORDER — TETANUS-DIPHTH-ACELL PERTUSSIS 5-2.5-18.5 LF-MCG/0.5 IM SUSP
0.5000 mL | Freq: Once | INTRAMUSCULAR | Status: AC
  Administered 2011-12-16: 0.5 mL via INTRAMUSCULAR
  Filled 2011-12-16: qty 0.5

## 2011-12-16 NOTE — ED Notes (Signed)
[name] [name]([facility]) [phone]
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
## 2012-01-12 ENCOUNTER — Encounter (HOSPITAL_COMMUNITY): Payer: Self-pay | Admitting: *Deleted

## 2012-01-12 ENCOUNTER — Emergency Department (HOSPITAL_COMMUNITY): Payer: Medicare (Managed Care)

## 2012-01-12 ENCOUNTER — Inpatient Hospital Stay (HOSPITAL_COMMUNITY)
Admission: EM | Admit: 2012-01-12 | Discharge: 2012-01-21 | DRG: 481 | Disposition: A | Discharge status: Skilled Nursing Facility | Payer: Medicare (Managed Care) | Source: Ambulatory Visit | Attending: Infectious Diseases | Admitting: Infectious Diseases

## 2012-01-12 DIAGNOSIS — N390 Urinary tract infection, site not specified: Secondary | ICD-10-CM | POA: Diagnosis present

## 2012-01-12 DIAGNOSIS — Y92009 Unspecified place in unspecified non-institutional (private) residence as the place of occurrence of the external cause: Secondary | ICD-10-CM

## 2012-01-12 DIAGNOSIS — K219 Gastro-esophageal reflux disease without esophagitis: Secondary | ICD-10-CM | POA: Diagnosis present

## 2012-01-12 DIAGNOSIS — Y846 Urinary catheterization as the cause of abnormal reaction of the patient, or of later complication, without mention of misadventure at the time of the procedure: Secondary | ICD-10-CM | POA: Diagnosis present

## 2012-01-12 DIAGNOSIS — F3289 Other specified depressive episodes: Secondary | ICD-10-CM | POA: Diagnosis present

## 2012-01-12 DIAGNOSIS — D62 Acute posthemorrhagic anemia: Secondary | ICD-10-CM | POA: Diagnosis not present

## 2012-01-12 DIAGNOSIS — K5900 Constipation, unspecified: Secondary | ICD-10-CM | POA: Diagnosis present

## 2012-01-12 DIAGNOSIS — R578 Other shock: Secondary | ICD-10-CM | POA: Diagnosis not present

## 2012-01-12 DIAGNOSIS — G35 Multiple sclerosis: Secondary | ICD-10-CM | POA: Diagnosis present

## 2012-01-12 DIAGNOSIS — IMO0002 Reserved for concepts with insufficient information to code with codable children: Secondary | ICD-10-CM | POA: Diagnosis not present

## 2012-01-12 DIAGNOSIS — F329 Major depressive disorder, single episode, unspecified: Secondary | ICD-10-CM | POA: Diagnosis present

## 2012-01-12 DIAGNOSIS — S72309A Unspecified fracture of shaft of unspecified femur, initial encounter for closed fracture: Principal | ICD-10-CM | POA: Diagnosis present

## 2012-01-12 DIAGNOSIS — W050XXA Fall from non-moving wheelchair, initial encounter: Secondary | ICD-10-CM | POA: Diagnosis present

## 2012-01-12 DIAGNOSIS — S85809A Unspecified injury of other blood vessels at lower leg level, unspecified leg, initial encounter: Secondary | ICD-10-CM | POA: Diagnosis present

## 2012-01-12 DIAGNOSIS — Y9210 Unspecified residential institution as the place of occurrence of the external cause: Secondary | ICD-10-CM | POA: Diagnosis not present

## 2012-01-12 DIAGNOSIS — Y831 Surgical operation with implant of artificial internal device as the cause of abnormal reaction of the patient, or of later complication, without mention of misadventure at the time of the procedure: Secondary | ICD-10-CM | POA: Diagnosis not present

## 2012-01-12 DIAGNOSIS — T83511A Infection and inflammatory reaction due to indwelling urethral catheter, initial encounter: Secondary | ICD-10-CM | POA: Diagnosis present

## 2012-01-12 DIAGNOSIS — Z79899 Other long term (current) drug therapy: Secondary | ICD-10-CM

## 2012-01-12 DIAGNOSIS — S7290XA Unspecified fracture of unspecified femur, initial encounter for closed fracture: Secondary | ICD-10-CM

## 2012-01-12 HISTORY — DX: Pneumonia, unspecified organism: J18.9

## 2012-01-12 HISTORY — DX: Diverticulosis of intestine, part unspecified, without perforation or abscess without bleeding: K57.90

## 2012-01-12 HISTORY — DX: Urinary tract infection, site not specified: N39.0

## 2012-01-12 HISTORY — DX: Other constipation: K59.09

## 2012-01-12 LAB — DIFFERENTIAL
Eosinophils Absolute: 0.2 10*3/uL (ref 0.0–0.7)
Eosinophils Relative: 4 % (ref 0–5)
Lymphs Abs: 1.7 10*3/uL (ref 0.7–4.0)
Monocytes Absolute: 0.7 10*3/uL (ref 0.1–1.0)

## 2012-01-12 LAB — TYPE AND SCREEN
ABO/RH(D): O POS
Unit division: 0
Unit division: 0
Unit division: 0
Unit division: 0
Unit division: 0
Unit division: 0

## 2012-01-12 LAB — BASIC METABOLIC PANEL
BUN: 16 mg/dL (ref 6–23)
CO2: 22 mEq/L (ref 19–32)
Calcium: 8.9 mg/dL (ref 8.4–10.5)
Creatinine, Ser: 0.77 mg/dL (ref 0.50–1.10)
GFR calc non Af Amer: 83 mL/min — ABNORMAL LOW (ref >90)
Glucose, Bld: 115 mg/dL — ABNORMAL HIGH (ref 70–99)
Sodium: 136 mEq/L (ref 135–145)

## 2012-01-12 LAB — URINALYSIS, ROUTINE W REFLEX MICROSCOPIC
Bilirubin Urine: NEGATIVE
Glucose, UA: NEGATIVE mg/dL
Ketones, ur: NEGATIVE mg/dL
Protein, ur: 30 mg/dL — AB
Urobilinogen, UA: 1 mg/dL (ref 0.0–1.0)

## 2012-01-12 LAB — CBC
HCT: 34 % — ABNORMAL LOW (ref 36.0–46.0)
MCH: 20.6 pg — ABNORMAL LOW (ref 26.0–34.0)
MCV: 69.2 fL — ABNORMAL LOW (ref 78.0–100.0)
Platelets: 267 10*3/uL (ref 150–400)
RBC: 4.91 MIL/uL (ref 3.87–5.11)

## 2012-01-12 LAB — URINE CULTURE: Colony Count: >=100000

## 2012-01-12 LAB — URINE MICROSCOPIC-ADD ON

## 2012-01-12 MED ORDER — FLUOXETINE HCL 20 MG PO CAPS
40.0000 mg | ORAL_CAPSULE | Freq: Every day | ORAL | Status: DC
  Administered 2012-01-14 – 2012-01-21 (×8): 40 mg via ORAL
  Filled 2012-01-12 (×9): qty 2

## 2012-01-12 MED ORDER — VITAMIN C 500 MG PO TABS
500.0000 mg | ORAL_TABLET | Freq: Two times a day (BID) | ORAL | Status: DC
  Administered 2012-01-13 – 2012-01-21 (×17): 500 mg via ORAL
  Filled 2012-01-12 (×21): qty 1

## 2012-01-12 MED ORDER — SODIUM CHLORIDE 0.9 % IJ SOLN: 3.0000 mL | INTRAMUSCULAR | Status: DC | PRN

## 2012-01-12 MED ORDER — NAPROXEN 250 MG PO TABS
250.0000 mg | ORAL_TABLET | Freq: Two times a day (BID) | ORAL | Status: DC
  Administered 2012-01-12: 250 mg via ORAL
  Filled 2012-01-12 (×4): qty 1

## 2012-01-12 MED ORDER — GABAPENTIN 300 MG PO CAPS
600.0000 mg | ORAL_CAPSULE | Freq: Every day | ORAL | Status: DC
  Administered 2012-01-14 – 2012-01-20 (×7): 600 mg via ORAL
  Filled 2012-01-12 (×9): qty 2

## 2012-01-12 MED ORDER — DOCUSATE SODIUM 100 MG PO CAPS
200.0000 mg | ORAL_CAPSULE | Freq: Two times a day (BID) | ORAL | Status: DC
  Administered 2012-01-12 – 2012-01-21 (×13): 200 mg via ORAL
  Filled 2012-01-12: qty 2
  Filled 2012-01-12: qty 1
  Filled 2012-01-12 (×11): qty 2
  Filled 2012-01-12: qty 1
  Filled 2012-01-12 (×5): qty 2

## 2012-01-12 MED ORDER — SODIUM CHLORIDE 0.9 % IV SOLN: 250.0000 mL | INTRAVENOUS | Status: DC | PRN

## 2012-01-12 MED ORDER — SENNOSIDES-DOCUSATE SODIUM 8.6-50 MG PO TABS: 1.0000 | ORAL_TABLET | Freq: Every evening | ORAL | Status: DC | PRN

## 2012-01-12 MED ORDER — THERA M PLUS PO TABS
1.0000 | ORAL_TABLET | Freq: Every day | ORAL | Status: DC
  Administered 2012-01-14 – 2012-01-20 (×7): 1 via ORAL
  Filled 2012-01-12 (×9): qty 1

## 2012-01-12 MED ORDER — MAGNESIUM CITRATE PO SOLN
1.0000 | Freq: Once | ORAL | Status: AC | PRN
  Filled 2012-01-12: qty 296

## 2012-01-12 MED ORDER — NAPROXEN SODIUM 220 MG PO TABS: 220.0000 mg | ORAL_TABLET | Freq: Two times a day (BID) | ORAL | Status: DC

## 2012-01-12 MED ORDER — ONDANSETRON HCL 4 MG PO TABS: 4.0000 mg | ORAL_TABLET | Freq: Four times a day (QID) | ORAL | Status: DC | PRN

## 2012-01-12 MED ORDER — GABAPENTIN 300 MG PO CAPS
300.0000 mg | ORAL_CAPSULE | Freq: Every day | ORAL | Status: DC
  Administered 2012-01-12 – 2012-01-20 (×8): 300 mg via ORAL
  Filled 2012-01-12 (×10): qty 1

## 2012-01-12 MED ORDER — DALFAMPRIDINE ER 10 MG PO TB12
10.0000 mg | ORAL_TABLET | Freq: Two times a day (BID) | ORAL | Status: DC
  Administered 2012-01-12 – 2012-01-20 (×16): 10 mg via ORAL
  Filled 2012-01-12 (×4): qty 1

## 2012-01-12 MED ORDER — ACETAMINOPHEN 650 MG RE SUPP: 650.0000 mg | Freq: Four times a day (QID) | RECTAL | Status: DC | PRN

## 2012-01-12 MED ORDER — AMITRIPTYLINE HCL 100 MG PO TABS
100.0000 mg | ORAL_TABLET | Freq: Every day | ORAL | Status: DC
  Administered 2012-01-12 – 2012-01-20 (×9): 100 mg via ORAL
  Filled 2012-01-12 (×13): qty 1

## 2012-01-12 MED ORDER — SODIUM CHLORIDE 0.9 % IV SOLN
INTRAVENOUS | Status: AC
  Administered 2012-01-12 (×2): via INTRAVENOUS

## 2012-01-12 MED ORDER — GABAPENTIN 300 MG PO CAPS: 300.0000 mg | ORAL_CAPSULE | Freq: Three times a day (TID) | ORAL | Status: DC

## 2012-01-12 MED ORDER — ONDANSETRON HCL 4 MG/2ML IJ SOLN: 4.0000 mg | Freq: Four times a day (QID) | INTRAMUSCULAR | Status: DC | PRN

## 2012-01-12 MED ORDER — CEFAZOLIN SODIUM-DEXTROSE 2-3 GM-% IV SOLR
2.0000 g | INTRAVENOUS | Status: AC
  Administered 2012-01-13: 2 g via INTRAVENOUS
  Filled 2012-01-12: qty 50

## 2012-01-12 MED ORDER — BISACODYL 10 MG RE SUPP: 10.0000 mg | Freq: Every day | RECTAL | Status: DC | PRN

## 2012-01-12 MED ORDER — GABAPENTIN 300 MG PO CAPS
300.0000 mg | ORAL_CAPSULE | Freq: Every day | ORAL | Status: DC
  Administered 2012-01-13 – 2012-01-20 (×9): 300 mg via ORAL
  Filled 2012-01-12 (×12): qty 1

## 2012-01-12 MED ORDER — PANTOPRAZOLE SODIUM 40 MG PO TBEC
40.0000 mg | DELAYED_RELEASE_TABLET | Freq: Two times a day (BID) | ORAL | Status: DC
  Administered 2012-01-14 – 2012-01-21 (×15): 40 mg via ORAL
  Filled 2012-01-12 (×16): qty 1

## 2012-01-12 MED ORDER — MORPHINE SULFATE 2 MG/ML IJ SOLN
2.0000 mg | INTRAMUSCULAR | Status: DC | PRN
  Administered 2012-01-12 (×2): 2 mg via INTRAVENOUS
  Filled 2012-01-12 (×3): qty 1

## 2012-01-12 MED ORDER — SODIUM CHLORIDE 0.9 % IJ SOLN
3.0000 mL | Freq: Two times a day (BID) | INTRAMUSCULAR | Status: DC
  Administered 2012-01-12: 3 mL via INTRAVENOUS

## 2012-01-12 MED ORDER — CIPROFLOXACIN IN D5W 400 MG/200ML IV SOLN
400.0000 mg | Freq: Two times a day (BID) | INTRAVENOUS | Status: DC
  Administered 2012-01-12 – 2012-01-16 (×7): 400 mg via INTRAVENOUS
  Filled 2012-01-12 (×8): qty 200

## 2012-01-12 MED ORDER — ACETAMINOPHEN 325 MG PO TABS
650.0000 mg | ORAL_TABLET | Freq: Four times a day (QID) | ORAL | Status: DC | PRN
  Administered 2012-01-15: 650 mg via ORAL
  Filled 2012-01-12: qty 2

## 2012-01-12 NOTE — ED Notes (Signed)
EMS-pt was trying to get out of recliner to get into wheelchair and slipped. Pt found prone on floor with left upper leg deformity, left leg very rotated on EMS arrival. 22g(L)hand, 20g(R)hand. of fentanyl given pta. Left leg placed in traction prior to pt being moved to supine position. Pt on LSB for movement. Pt with hx of MS, with indwelling foley.

## 2012-01-12 NOTE — ED Provider Notes (Addendum)
History     CSN: [id number]  Arrival date & time 01/12/12  1251   First MD Initiated Contact with Patient 01/12/12 1305      Chief Complaint  Patient presents with  . Fall  . Leg Injury    (Consider location/radiation/quality/duration/timing/severity/associated sxs/prior treatment) Patient is a 71 y.o. female presenting with fall. The history is provided by the patient.  Fall   patient presents after slipping and falling while trying to get out of her wheelchair. No loss of consciousness no neck pain. Complains of pain to her left thigh. Unable to ambulate. History of multiple sclerosis. EMS was called and patient placed into traction and transported here. She was given fentanyl prior to arrival for pain management  Past Medical History  Diagnosis Date  . Multiple sclerosis   . GERD (gastroesophageal reflux disease)   . Depression     Past Surgical History  Procedure Date  . Cholecystectomy   . Orthopedic surgery     History reviewed. No pertinent family history.  History  Substance Use Topics  . Smoking status: Never Smoker   . Smokeless tobacco: Not on file  . Alcohol Use: No    OB History    Grav Para Term Preterm Abortions TAB SAB Ect Mult Living                  Review of Systems  All other systems reviewed and are negative.    Allergies  Review of patient's allergies indicates no known allergies.  Home Medications   Current Outpatient Rx  Name Route Sig Dispense Refill  . AMITRIPTYLINE HCL 50 MG PO TABS Oral Take 100 mg by mouth at bedtime.      Marland Kitchen DALFAMPRIDINE ER 10 MG PO TB12 Oral Take 10 mg by mouth 2 (two) times daily.      Marland Kitchen DOCUSATE SODIUM 100 MG PO CAPS Oral Take 200 mg by mouth 2 (two) times daily.      Marland Kitchen FLUOXETINE HCL 40 MG PO CAPS Oral Take 40 mg by mouth daily.      Marland Kitchen GABAPENTIN 300 MG PO CAPS Oral Take 300-600 mg by mouth 3 (three) times daily. Take two capsules at lunch and one tablet at dinner and bedtime    . THERA M PLUS PO TABS  Oral Take 1 tablet by mouth daily.      Marland Kitchen NAPROXEN SODIUM 220 MG PO TABS Oral Take 220 mg by mouth 2 (two) times daily with a meal.      . OMEPRAZOLE 20 MG PO CPDR Oral Take 20 mg by mouth 2 (two) times daily.      Marland Kitchen VITAMIN C 500 MG PO TABS Oral Take 500 mg by mouth 2 (two) times daily.        BP 115/68  Pulse 84  Temp(Src) 97.5 F (36.4 C) (Oral)  Resp 11  SpO2 91%  Physical Exam  Nursing note and vitals reviewed. Constitutional: She is oriented to person, place, and time. Vital signs are normal. She appears well-developed and well-nourished.  Non-toxic appearance. No distress.  HENT:  Head: Normocephalic and atraumatic.  Eyes: Conjunctivae, EOM and lids are normal. Pupils are equal, round, and reactive to light.  Neck: Normal range of motion. Neck supple. No tracheal deviation present. No mass present.  Cardiovascular: Normal rate, regular rhythm and normal heart sounds.  Exam reveals no gallop.   No murmur heard. Pulmonary/Chest: Effort normal and breath sounds normal. No stridor. No respiratory distress. She has  no decreased breath sounds. She has no wheezes. She has no rhonchi. She has no rales.  Abdominal: Soft. Normal appearance and bowel sounds are normal. She exhibits no distension. There is no tenderness. There is no rebound and no CVA tenderness.  Musculoskeletal: Normal range of motion. She exhibits no edema and no tenderness.       Left upper leg: She exhibits tenderness, bony tenderness and swelling.       Legs: Neurological: She is alert and oriented to person, place, and time. She has normal strength. No cranial nerve deficit or sensory deficit. GCS eye subscore is 4. GCS verbal subscore is 5. GCS motor subscore is 6.  Skin: Skin is warm and dry. No abrasion and no rash noted.  Psychiatric: She has a normal mood and affect. Her speech is normal and behavior is normal.    ED Course  Procedures (including critical care time)   Labs Reviewed  CBC  DIFFERENTIAL    BASIC METABOLIC PANEL  URINALYSIS, ROUTINE W REFLEX MICROSCOPIC  URINE CULTURE   No results found.   No diagnosis found.    MDM  Pt placed in traction pta, pain meds given, spoke with medicine and ortho, pt to be admitted        Toy Baker, MD 01/12/12 1456  Toy Baker, MD 01/12/12 1501

## 2012-01-12 NOTE — Progress Notes (Signed)
Orthopedic Tech Progress Note Patient Details:  [name] [name] 1941/05/27 [id number]  Other Ortho Devices Type of Ortho Device: Knee Immobilizer Ortho Device Location: left leg Ortho Device Interventions: Application   Nikki Dom 01/12/2012, 9:24 PM

## 2012-01-12 NOTE — H&P (Signed)
Date: 01/12/2012            Patient Name:  [name] [name]  MRN: [id number]  DOB: [birth year]  Age / Sex: 71 y.o., female   PCP: Thane Edu, MD, MD           Medical Service: Internal Medicine Teaching Service     Attending Physician: Dr. Lina Sayre, MD      Chief Complaint: left leg injury, heard snap on fall  History of Present Illness: Patient is a 71 y.o. female with a PMHx of multiple sclerosis, who presents to [facility] for evaluation of left leg pain that occurred in the setting of a fall earlier today when she was transferring from her bed to a wheelchair. She said she placed her foot on the ground and was not able to keep herself upright. She heard a distinct snapping sound. She has a recent history of spiral humerus fracture in February of 2012 that required open reduction and fixation. Patient denies any other infectious symptoms, no fever, chills, nausea, vomiting, diarrhea or abdominal pain. She has been feeling well before her accident and has been successfully treating her MS with a new treatment for the past 3 months. At baseline she has been using a wheelchair to get around and can only transfer on her own. She lives alone but has a home nurse that comes twice a day and helps with bathing, meal preparation etc. [facility] and enjoys her time there. As far as her chronic issues, she has had some trouble with urinary infections and chronic constipation. Her last BM was this morning and was normal. Her urine appears deep amber colored and there is a strong urine odor in the room. She has had a foley catheter for approximately the last 6 months.  Past Medical History: Past Medical History  Diagnosis Date  . Multiple sclerosis   . GERD (gastroesophageal reflux disease)   . Depression   . Recurrent UTI   . Recurrent pneumonia   . Chronic constipation   . Diverticulosis    Past Surgical History: Past Surgical History  Procedure Date  .  Cholecystectomy   . Orif humeral shaft fracture 01/2011  . Video assisted thoracoscopy 06/2010  . Closed reduction shoulder dislocation 04/2010    Fracture dislocation   Family History: History reviewed. No pertinent family history.  Social History: History   Social History  . Marital Status: Unknown    Spouse Name: N/A    Number of Children: 2  . Years of Education: N/A   Occupational History  . seamstress     [employer]   Social History Main Topics  . Smoking status: Never Smoker   . Smokeless tobacco: Not on file  . Alcohol Use: No  . Drug Use: No  . Sexually Active:    Other Topics Concern  . Not on file   Social History Narrative   Lives in [place] by herself. Has two children who live in [place].   Current Outpatient Medications: Prescriptions prior to admission  Medication Sig Dispense Refill  . amitriptyline (ELAVIL) 50 MG tablet Take 100 mg by mouth at bedtime.        Marland Kitchen dalfampridine (AMPYRA) 10 MG TB12 Take 10 mg by mouth 2 (two) times daily.        Marland Kitchen docusate sodium (COLACE) 100 MG capsule Take 200 mg by mouth 2 (two) times daily.        Marland Kitchen  FLUoxetine (PROZAC) 40 MG capsule Take 40 mg by mouth daily.        Marland Kitchen gabapentin (NEURONTIN) 300 MG capsule Take 300-600 mg by mouth 3 (three) times daily. Take two capsules at lunch and one tablet at dinner and bedtime      . Multiple Vitamins-Minerals (MULTIVITAMINS THER. W/MINERALS) TABS Take 1 tablet by mouth daily.        . naproxen sodium (ANAPROX) 220 MG tablet Take 220 mg by mouth 2 (two) times daily with a meal.        . omeprazole (PRILOSEC) 20 MG capsule Take 20 mg by mouth 2 (two) times daily.        . vitamin C (ASCORBIC ACID) 500 MG tablet Take 500 mg by mouth 2 (two) times daily.         Allergies: No Known Allergies   Vital Signs: Blood pressure 122/52, pulse 84, temperature 97.3 F (36.3 C), temperature source Axillary, resp. rate 20, SpO2 96.00%.  Physical Exam: General: pleasant elderly  woman resting in bed, in no acute distress HEENT: PERRL, EOMI, no scleral icterus Cardiac: RRR, no rubs, murmurs or gallops Pulm: clear to auscultation bilaterally, moving normal volumes of air Abd: soft, nontender, nondistended, BS present Ext: warm and well perfused, no pedal edema Neuro: alert and oriented X3, cranial nerves II-XII grossly intact. Did not assess left leg due to fracture and acute pain.  Lab results: Basic Metabolic Panel: Recent Labs  Basename 01/12/12 1316   NA 136   K 4.2   CL 105   CO2 22   GLUCOSE 115*   BUN 16   CREATININE 0.77   CALCIUM 8.9   MG --   PHOS --   CBC: Recent Labs  Basename 01/12/12 1316   WBC 5.7   NEUTROABS 3.1   HGB 10.1*   HCT 34.0*   MCV 69.2*   PLT 267   Imaging results:  Dg Pelvis Portable  01/12/2012  *RADIOLOGY REPORT*  Clinical Data: Distal left femur fracture.  PORTABLE PELVIS  Comparison: None.  Findings: 1350 hours.  The left inferior pubic rami and left femoral neck are partly obscured by the left thigh immobilization device.  There is no evidence of acute pelvic fracture or dislocation.  Mild degenerative changes are present at both hips. Left pelvic calcification may be vascular or reflect a degenerated fibroid.  IMPRESSION: No acute osseous findings demonstrated at the pelvis.  Of note, portions of the left femoral neck and left pubic rami are obscured by the left thigh immobilization device.  Original Report Authenticated By: Gerrianne Scale, M.D.   Dg Chest Portable 1 View  01/12/2012  *RADIOLOGY REPORT*  Clinical Data: Status post fall.  Possible femur fracture.  History of chronic pneumonia and multiple sclerosis.  PORTABLE CHEST - 1 VIEW  Comparison: None.  Findings: 1400 hours.  There is mild patient rotation to the left. Heart size and mediastinal contours are normal.  There is mild volume loss in the left hemithorax with patchy left basilar pulmonary opacity.  The right lung is clear.  No pleural effusion,  pneumothorax or acute osseous abnormality is identified.  IMPRESSION: Patchy left basilar pulmonary opacity is associated with volume loss and could reflect chronic scarring or atelectasis.  Without prior examinations, pneumonia cannot be excluded.  Follow-up recommended.  Original Report Authenticated By: Gerrianne Scale, M.D.   Dg Femur Left Port  01/12/2012  *RADIOLOGY REPORT*  Clinical Data: Fall, left femur pain  PORTABLE LEFT  FEMUR - 2 VIEW  Comparison: None.  Findings: There is a spiral type fracture of the distal left femoral metadiaphysis, with one shaft width overlap of the fracture fragments.  Posterior displacement of the distal fracture fragment is noted.  No radiopaque foreign body.  Overlying soft tissue swelling is evident.  IMPRESSION: Spiral type fracture of the distal left femoral metadiaphysis.  Original Report Authenticated By: Harrel Lemon, M.D.   Assessment & Plan:  Pt is a 71 y.o. yo female with a PMHx of MS  1) Left femur shaft fracture: Secondary to fall. Patient was normal DEXA scan and previous left humerus fracture in February 2012.  Plan: Surgical intervention per orthopedics. - Followup ortho recommendations.  2) UTI: Patient with indwelling Foley catheter. Has UA and microscopy showing signs of infection.  Plan: Start Cipro IV twice a day. - Wait for culture and sensitivity results.  3) Chronic constipation: Continue home regimen.  4) Multiple sclerosis: Stable at this point of time. - Continue home meds.  5) DVT prophylaxis: Lovenox.

## 2012-01-12 NOTE — Consult Note (Signed)
NAME: [name] [name] MRN:   [id number] DOB:   Sep 02, 1941     Ortho Consult Note  CHIEF COMPLAINT:  Left femur fracture  HISTORY:   [name] [name] a 71 y.o. female  With history of MS who presented to [facility] ED today following a fall while transferring form her bed to wheelchair.  She complains of left thigh pain.  X-rays obtained show left distal 1/3 femur spiral fracture.  She states prior to fracture she was doing well using a wheelchair at baseline.  She had ORIF humerus fracture by Dr. Dion Saucier in Feb 2012 and is doing well with this.  She does have chronic UTI with indwelling catheter and chronic constipation.  She denies any chest pain, shortness of breath, nausea/vomiting, abdominal pain, fever, chills, sweats.   PAST MEDICAL HISTORY:   Past Medical History  Diagnosis Date  . Multiple sclerosis   . GERD (gastroesophageal reflux disease)   . Depression   . Recurrent UTI   . Recurrent pneumonia   . Chronic constipation     PAST SURGICAL HISTORY:   Past Surgical History  Procedure Date  . Cholecystectomy   . Orif humeral shaft fracture 01/2011  . Video assisted thoracoscopy 06/2010  . Closed reduction shoulder dislocation 04/2010    Fracture dislocation    MEDICATIONS:   Medications Prior to Admission  Medication Dose Route Frequency Provider Last Rate Last Dose  . 0.9 %  sodium chloride infusion   Intravenous STAT Toy Baker, MD 150 mL/hr at 01/12/12 1704    . 0.9 %  sodium chloride infusion  250 mL Intravenous PRN Lyn Hollingshead, MD      . acetaminophen (TYLENOL) tablet 650 mg  650 mg Oral Q6H PRN Lyn Hollingshead, MD       Or  . acetaminophen (TYLENOL) suppository 650 mg  650 mg Rectal Q6H PRN Lyn Hollingshead, MD      . amitriptyline (ELAVIL) tablet 100 mg  100 mg Oral QHS Lyn Hollingshead, MD      . ciprofloxacin (CIPRO) IVPB 400 mg  400 mg Intravenous Q12H Lyn Hollingshead, MD   400 mg at 01/12/12 1808  . dalfampridine TB12 10 mg  10 mg Oral BID Lyn Hollingshead, MD      . docusate sodium (COLACE) capsule  200 mg  200 mg Oral BID Lyn Hollingshead, MD      . FLUoxetine (PROZAC) capsule 40 mg  40 mg Oral Daily Lyn Hollingshead, MD      . gabapentin (NEURONTIN) capsule 300 mg  300 mg Oral QHS Jessica Brown [place], PHARMD      . gabapentin (NEURONTIN) capsule 300 mg  300 mg Oral Q supper Gala Lewandowsky [place], PHARMD   300 mg at 01/12/12 1808  . gabapentin (NEURONTIN) capsule 600 mg  600 mg Oral Q lunch Gala Lewandowsky [place], [place]      . morphine 2 MG/ML injection 2-4 mg  2-4 mg Intravenous Q3H PRN Lyn Hollingshead, MD   2 mg at 01/12/12 1752  . multivitamins ther. w/minerals tablet 1 tablet  1 tablet Oral Daily Lyn Hollingshead, MD      . naproxen (NAPROSYN) tablet 250 mg  250 mg Oral BID WC Fayne [place], PHARMD   250 mg at 01/12/12 1807  . ondansetron (ZOFRAN) tablet 4 mg  4 mg Oral Q6H PRN Lyn Hollingshead, MD       Or  . ondansetron [facility]) injection 4 mg  4 mg Intravenous Q6H PRN Lyn Hollingshead, MD      .  pantoprazole (PROTONIX) EC tablet 40 mg  40 mg Oral BID AC Lyn Hollingshead, MD      . sodium chloride 0.9 % injection 3 mL  3 mL Intravenous Q12H Lyn Hollingshead, MD      . sodium chloride 0.9 % injection 3 mL  3 mL Intravenous PRN Lyn Hollingshead, MD      . vitamin C (ASCORBIC ACID) tablet 500 mg  500 mg Oral BID Lyn Hollingshead, MD      . DISCONTD: gabapentin (NEURONTIN) capsule 300-600 mg  300-600 mg Oral TID Lyn Hollingshead, MD      . DISCONTD: naproxen sodium (ANAPROX) tablet 220 mg  220 mg Oral BID WC Lyn Hollingshead, MD       Medications Prior to Admission  Medication Sig Dispense Refill  . amitriptyline (ELAVIL) 50 MG tablet Take 100 mg by mouth at bedtime.        Marland Kitchen dalfampridine (AMPYRA) 10 MG TB12 Take 10 mg by mouth 2 (two) times daily.        Marland Kitchen docusate sodium (COLACE) 100 MG capsule Take 200 mg by mouth 2 (two) times daily.        Marland Kitchen FLUoxetine (PROZAC) 40 MG capsule Take 40 mg by mouth daily.        Marland Kitchen gabapentin (NEURONTIN) 300 MG capsule Take 300-600 mg by mouth 3 (three) times daily. Take two capsules at lunch and one tablet at  dinner and bedtime      . Multiple Vitamins-Minerals (MULTIVITAMINS THER. W/MINERALS) TABS Take 1 tablet by mouth daily.        . naproxen sodium (ANAPROX) 220 MG tablet Take 220 mg by mouth 2 (two) times daily with a meal.        . omeprazole (PRILOSEC) 20 MG capsule Take 20 mg by mouth 2 (two) times daily.        . vitamin C (ASCORBIC ACID) 500 MG tablet Take 500 mg by mouth 2 (two) times daily.          ALLERGIES:  No Known Allergies  REVIEW OF SYSTEMS:   Negative except chronic UTI, chronic constipation, pain in left leg.  FAMILY HISTORY:  History reviewed. No pertinent family history.  SOCIAL HISTORY:   reports that she has never smoked. She does not have any smokeless tobacco history on file. She reports that she does not drink alcohol or use illicit drugs.  PHYSICAL EXAM:  General appearance: alert, cooperative and appears stated age Head: Normocephalic, without obvious abnormality, atraumatic Eyes: negative findings: pupils equal, round, reactive to light and accomodation Ears: atraumatic Nose: Nares normal, no drainage Throat: normal findings: lips normal without lesions Neck: no adenopathy and supple, symmetrical, trachea midline Resp: clear to auscultation bilaterally Chest wall: no tenderness Cardio: regular rate and rhythm, S1, S2 normal, no murmur, click, rub or gallop GI: normal findings: bowel sounds normal and soft, non-tender Extremities: edema left lower extremity, Homans sign is negative, no sign of DVT and tender to palpation over left thigh, no skin breaks, leg in skin traction but not holding well Pulses: palpable distal pulses    LABORATORY STUDIES:  Basename 01/12/12 1316  WBC 5.7  HGB 10.1*  HCT 34.0*  PLT 267     Basename 01/12/12 1316  NA 136  K 4.2  CL 105  CO2 22  GLUCOSE 115*  BUN 16  CREATININE 0.77  CALCIUM 8.9    STUDIES/RESULTS:  Dg Pelvis Portable  01/12/2012  *RADIOLOGY REPORT*  Clinical Data: Distal left femur  fracture.   PORTABLE PELVIS  Comparison: None.  Findings: 1350 hours.  The left inferior pubic rami and left femoral neck are partly obscured by the left thigh immobilization device.  There is no evidence of acute pelvic fracture or dislocation.  Mild degenerative changes are present at both hips. Left pelvic calcification may be vascular or reflect a degenerated fibroid.  IMPRESSION: No acute osseous findings demonstrated at the pelvis.  Of note, portions of the left femoral neck and left pubic rami are obscured by the left thigh immobilization device.  Original Report Authenticated By: Gerrianne Scale, M.D.   Dg Chest Portable 1 View  01/12/2012  *RADIOLOGY REPORT*  Clinical Data: Status post fall.  Possible femur fracture.  History of chronic pneumonia and multiple sclerosis.  PORTABLE CHEST - 1 VIEW  Comparison: None.  Findings: 1400 hours.  There is mild patient rotation to the left. Heart size and mediastinal contours are normal.  There is mild volume loss in the left hemithorax with patchy left basilar pulmonary opacity.  The right lung is clear.  No pleural effusion, pneumothorax or acute osseous abnormality is identified.  IMPRESSION: Patchy left basilar pulmonary opacity is associated with volume loss and could reflect chronic scarring or atelectasis.  Without prior examinations, pneumonia cannot be excluded.  Follow-up recommended.  Original Report Authenticated By: Gerrianne Scale, M.D.   Dg Femur Left Port  01/12/2012  *RADIOLOGY REPORT*  Clinical Data: Fall, left femur pain  PORTABLE LEFT FEMUR - 2 VIEW  Comparison: None.  Findings: There is a spiral type fracture of the distal left femoral metadiaphysis, with one shaft width overlap of the fracture fragments.  Posterior displacement of the distal fracture fragment is noted.  No radiopaque foreign body.  Overlying soft tissue swelling is evident.  IMPRESSION: Spiral type fracture of the distal left femoral metadiaphysis.  Original Report Authenticated  By: Harrel Lemon, M.D.    ASSESSMENT: Left Distal third femur fracture       PLAN:  OR tomorrow Left Retrograde Nail Femur, NPO tonight after midnight.  D/C traction place knee immobilizer.  Pain control, continue cipro as recommended by medicine for chronic UTI.    [name] 01/12/2012. 6:51 PM        No change with consult, proceed with surgery.

## 2012-01-13 ENCOUNTER — Encounter (HOSPITAL_COMMUNITY)
Admission: EM | Disposition: A | Discharge status: Skilled Nursing Facility | Payer: Self-pay | Source: Ambulatory Visit | Attending: Infectious Diseases

## 2012-01-13 ENCOUNTER — Inpatient Hospital Stay (HOSPITAL_COMMUNITY): Payer: Medicare (Managed Care) | Admitting: Anesthesiology

## 2012-01-13 ENCOUNTER — Encounter (HOSPITAL_COMMUNITY): Payer: Self-pay | Admitting: Anesthesiology

## 2012-01-13 ENCOUNTER — Inpatient Hospital Stay (HOSPITAL_COMMUNITY): Payer: Medicare (Managed Care)

## 2012-01-13 DIAGNOSIS — IMO0002 Reserved for concepts with insufficient information to code with codable children: Secondary | ICD-10-CM

## 2012-01-13 DIAGNOSIS — S7290XA Unspecified fracture of unspecified femur, initial encounter for closed fracture: Secondary | ICD-10-CM

## 2012-01-13 HISTORY — PX: FEMORAL ARTERY EXPLORATION: SHX5160

## 2012-01-13 HISTORY — PX: FEMUR IM NAIL: SHX1597

## 2012-01-13 LAB — POCT I-STAT 4, (NA,K, GLUC, HGB,HCT)
Glucose, Bld: 98 mg/dL (ref 70–99)
HCT: 18 % — ABNORMAL LOW (ref 36.0–46.0)

## 2012-01-13 LAB — CBC
HCT: 33.3 % — ABNORMAL LOW (ref 36.0–46.0)
HCT: 30.9 % — ABNORMAL LOW (ref 36.0–46.0)
HCT: 33.8 % — ABNORMAL LOW (ref 36.0–46.0)
Hemoglobin: 10.6 g/dL — ABNORMAL LOW (ref 12.0–15.0)
MCH: 21.1 pg — ABNORMAL LOW (ref 26.0–34.0)
MCH: 25 pg — ABNORMAL LOW (ref 26.0–34.0)
MCHC: 30.5 g/dL (ref 30.0–36.0)
MCHC: 31.8 g/dL (ref 30.0–36.0)
MCV: 69 fL — ABNORMAL LOW (ref 78.0–100.0)
MCV: 77.3 fL — ABNORMAL LOW (ref 78.0–100.0)
MCV: 79.3 fL (ref 78.0–100.0)
Platelets: 286 10*3/uL (ref 150–400)
Platelets: 133 10*3/uL — ABNORMAL LOW (ref 150–400)
RBC: 4.51 MIL/uL (ref 3.87–5.11)
RBC: 4.26 MIL/uL (ref 3.87–5.11)
RDW: 16.8 % — ABNORMAL HIGH (ref 11.5–15.5)
RDW: 21.1 % — ABNORMAL HIGH (ref 11.5–15.5)
WBC: 7.4 10*3/uL (ref 4.0–10.5)

## 2012-01-13 LAB — DIFFERENTIAL
Basophils Absolute: 0 10*3/uL (ref 0.0–0.1)
Eosinophils Relative: 0 % (ref 0–5)
Lymphs Abs: 0.9 10*3/uL (ref 0.7–4.0)
Monocytes Absolute: 0.6 10*3/uL (ref 0.1–1.0)
Monocytes Relative: 8 % (ref 3–12)
Neutro Abs: 5.7 10*3/uL (ref 1.7–7.7)
WBC Morphology: INCREASED

## 2012-01-13 LAB — PREPARE FRESH FROZEN PLASMA
Unit division: 0
Unit division: 0

## 2012-01-13 LAB — BASIC METABOLIC PANEL
BUN: 12 mg/dL (ref 6–23)
CO2: 25 mEq/L (ref 19–32)
Calcium: 8.7 mg/dL (ref 8.4–10.5)
Creatinine, Ser: 0.77 mg/dL (ref 0.50–1.10)
GFR calc non Af Amer: 83 mL/min — ABNORMAL LOW (ref >90)
Glucose, Bld: 90 mg/dL (ref 70–99)
Sodium: 139 mEq/L (ref 135–145)

## 2012-01-13 LAB — PREPARE RBC (CROSSMATCH)

## 2012-01-13 LAB — APTT: aPTT: 27 seconds (ref 24–37)

## 2012-01-13 LAB — SURGICAL PCR SCREEN: Staphylococcus aureus: NEGATIVE

## 2012-01-13 SURGERY — WOUND EXPLORATION
Anesthesia: General | Laterality: Left

## 2012-01-13 SURGERY — INSERTION, INTRAMEDULLARY ROD, FEMUR, RETROGRADE
Anesthesia: General | Site: Leg Upper | Laterality: Left | Wound class: Clean

## 2012-01-13 MED ORDER — LACTATED RINGERS IV SOLN
INTRAVENOUS | Status: DC | PRN
  Administered 2012-01-13: 15:00:00 via INTRAVENOUS

## 2012-01-13 MED ORDER — PHENYLEPHRINE HCL 10 MG/ML IJ SOLN
10.0000 mg | INTRAVENOUS | Status: DC | PRN
  Administered 2012-01-13: 10 ug/min via INTRAVENOUS

## 2012-01-13 MED ORDER — PHENYLEPHRINE HCL 10 MG/ML IJ SOLN
INTRAMUSCULAR | Status: DC | PRN
  Administered 2012-01-13 (×2): 40 ug via INTRAVENOUS
  Administered 2012-01-13 (×2): 80 ug via INTRAVENOUS

## 2012-01-13 MED ORDER — SODIUM CHLORIDE 0.9 % IV SOLN
INTRAVENOUS | Status: DC | PRN
  Administered 2012-01-13: 11:00:00 via INTRAVENOUS

## 2012-01-13 MED ORDER — OXYCODONE HCL 5 MG PO TABS
5.0000 mg | ORAL_TABLET | ORAL | Status: DC | PRN
  Administered 2012-01-13 – 2012-01-21 (×21): 5 mg via ORAL
  Filled 2012-01-13 (×21): qty 1

## 2012-01-13 MED ORDER — ETOMIDATE 2 MG/ML IV SOLN
INTRAVENOUS | Status: DC | PRN
  Administered 2012-01-13: 14 mg via INTRAVENOUS

## 2012-01-13 MED ORDER — MORPHINE SULFATE 2 MG/ML IJ SOLN: 0.0500 mg/kg | INTRAMUSCULAR | Status: DC | PRN

## 2012-01-13 MED ORDER — ENOXAPARIN SODIUM 30 MG/0.3ML ~~LOC~~ SOLN
30.0000 mg | Freq: Two times a day (BID) | SUBCUTANEOUS | Status: DC
  Filled 2012-01-13: qty 0.3

## 2012-01-13 MED ORDER — PROPOFOL 10 MG/ML IV EMUL
INTRAVENOUS | Status: DC | PRN
  Administered 2012-01-13: 50 mg via INTRAVENOUS
  Administered 2012-01-13: 750 mg via INTRAVENOUS

## 2012-01-13 MED ORDER — HETASTARCH-ELECTROLYTES 6 % IV SOLN
INTRAVENOUS | Status: DC | PRN
  Administered 2012-01-13 (×2): via INTRAVENOUS

## 2012-01-13 MED ORDER — FENTANYL CITRATE 0.05 MG/ML IJ SOLN
INTRAMUSCULAR | Status: DC | PRN
  Administered 2012-01-13: 150 ug via INTRAVENOUS

## 2012-01-13 MED ORDER — SENNOSIDES-DOCUSATE SODIUM 8.6-50 MG PO TABS
1.0000 | ORAL_TABLET | Freq: Every evening | ORAL | Status: DC | PRN
  Filled 2012-01-13: qty 1

## 2012-01-13 MED ORDER — HYDROMORPHONE HCL PF 1 MG/ML IJ SOLN: 0.2500 mg | INTRAMUSCULAR | Status: DC | PRN

## 2012-01-13 MED ORDER — EPHEDRINE SULFATE 50 MG/ML IJ SOLN
INTRAMUSCULAR | Status: DC | PRN
  Administered 2012-01-13: 10 mg via INTRAVENOUS
  Administered 2012-01-13: 5 mg via INTRAVENOUS

## 2012-01-13 MED ORDER — CEFAZOLIN SODIUM 1-5 GM-% IV SOLN
INTRAVENOUS | Status: DC | PRN
  Administered 2012-01-13: 1 g via INTRAVENOUS

## 2012-01-13 MED ORDER — SODIUM CHLORIDE 0.9 % IV SOLN: Freq: Once | INTRAVENOUS | Status: DC

## 2012-01-13 MED ORDER — HYDROMORPHONE HCL PF 1 MG/ML IJ SOLN
0.2500 mg | INTRAMUSCULAR | Status: DC | PRN
  Administered 2012-01-15: 0.25 mg via INTRAVENOUS
  Filled 2012-01-13: qty 1

## 2012-01-13 MED ORDER — NEOSTIGMINE METHYLSULFATE 1 MG/ML IJ SOLN
INTRAMUSCULAR | Status: DC | PRN
  Administered 2012-01-13: 4 mg via INTRAVENOUS

## 2012-01-13 MED ORDER — CEFAZOLIN SODIUM 1-5 GM-% IV SOLN
INTRAVENOUS | Status: AC
  Filled 2012-01-13: qty 50

## 2012-01-13 MED ORDER — LACTATED RINGERS IV SOLN
INTRAVENOUS | Status: DC | PRN
  Administered 2012-01-13 (×3): via INTRAVENOUS

## 2012-01-13 MED ORDER — SODIUM CHLORIDE 0.9 % IV SOLN: INTRAVENOUS | Status: DC

## 2012-01-13 MED ORDER — EPHEDRINE SULFATE 50 MG/ML IJ SOLN
INTRAMUSCULAR | Status: DC | PRN
  Administered 2012-01-13 (×2): 5 mg via INTRAVENOUS

## 2012-01-13 MED ORDER — SODIUM CHLORIDE 0.9 % IV SOLN
INTRAVENOUS | Status: DC | PRN
  Administered 2012-01-13: 15:00:00 via INTRAVENOUS

## 2012-01-13 MED ORDER — DEXAMETHASONE SODIUM PHOSPHATE 4 MG/ML IJ SOLN
INTRAMUSCULAR | Status: DC | PRN
  Administered 2012-01-13: 4 mg via INTRAVENOUS

## 2012-01-13 MED ORDER — ONDANSETRON HCL 4 MG/2ML IJ SOLN
INTRAMUSCULAR | Status: DC | PRN
  Administered 2012-01-13: 4 mg via INTRAVENOUS

## 2012-01-13 MED ORDER — LIDOCAINE HCL (CARDIAC) 20 MG/ML IV SOLN
INTRAVENOUS | Status: DC | PRN
  Administered 2012-01-13: 40 mg via INTRAVENOUS

## 2012-01-13 MED ORDER — ROCURONIUM BROMIDE 100 MG/10ML IV SOLN
INTRAVENOUS | Status: DC | PRN
  Administered 2012-01-13: 50 mg via INTRAVENOUS

## 2012-01-13 MED ORDER — MAGNESIUM CITRATE PO SOLN
1.0000 | Freq: Once | ORAL | Status: AC | PRN
  Filled 2012-01-13: qty 296

## 2012-01-13 MED ORDER — GLYCOPYRROLATE 0.2 MG/ML IJ SOLN
INTRAMUSCULAR | Status: DC | PRN
  Administered 2012-01-13: .6 mg via INTRAVENOUS

## 2012-01-13 MED ORDER — BISACODYL 10 MG RE SUPP: 10.0000 mg | Freq: Every day | RECTAL | Status: DC | PRN

## 2012-01-13 MED ORDER — METOCLOPRAMIDE HCL 5 MG/ML IJ SOLN
5.0000 mg | Freq: Three times a day (TID) | INTRAMUSCULAR | Status: DC | PRN
  Filled 2012-01-13: qty 2

## 2012-01-13 MED ORDER — FENTANYL CITRATE 0.05 MG/ML IJ SOLN
INTRAMUSCULAR | Status: DC | PRN
  Administered 2012-01-13: 50 ug via INTRAVENOUS
  Administered 2012-01-13: 25 ug via INTRAVENOUS
  Administered 2012-01-13 (×2): 50 ug via INTRAVENOUS

## 2012-01-13 MED ORDER — SUCCINYLCHOLINE CHLORIDE 20 MG/ML IJ SOLN
INTRAMUSCULAR | Status: DC | PRN
  Administered 2012-01-13: 100 mg via INTRAVENOUS

## 2012-01-13 MED ORDER — ONDANSETRON HCL 4 MG/2ML IJ SOLN
4.0000 mg | Freq: Once | INTRAMUSCULAR | Status: DC | PRN
  Filled 2012-01-13: qty 2

## 2012-01-13 MED ORDER — CEFAZOLIN SODIUM-DEXTROSE 2-3 GM-% IV SOLR
2.0000 g | Freq: Four times a day (QID) | INTRAVENOUS | Status: AC
  Administered 2012-01-13 – 2012-01-14 (×3): 2 g via INTRAVENOUS
  Filled 2012-01-13 (×4): qty 50

## 2012-01-13 MED ORDER — MEPERIDINE HCL 25 MG/ML IJ SOLN: 6.2500 mg | INTRAMUSCULAR | Status: DC | PRN

## 2012-01-13 MED ORDER — ONDANSETRON HCL 4 MG/2ML IJ SOLN: 4.0000 mg | Freq: Once | INTRAMUSCULAR | Status: DC | PRN

## 2012-01-13 SURGICAL SUPPLY — 65 items
BANDAGE ELASTIC 4 VELCRO ST LF (GAUZE/BANDAGES/DRESSINGS) ×3 IMPLANT
BANDAGE ELASTIC 6 VELCRO ST LF (GAUZE/BANDAGES/DRESSINGS) ×3 IMPLANT
BANDAGE ESMARK 6X9 LF (GAUZE/BANDAGES/DRESSINGS) IMPLANT
BANDAGE GAUZE ELAST BULKY 4 IN (GAUZE/BANDAGES/DRESSINGS) ×3 IMPLANT
BIT DRILL CROWE POINT TWST 4.3 (DRILL) ×2 IMPLANT
BLADE SURG 15 STRL LF DISP TIS (BLADE) ×2 IMPLANT
BLADE SURG 15 STRL SS (BLADE) ×1
BLADE SURG ROTATE 9660 (MISCELLANEOUS) IMPLANT
BNDG COHESIVE 6X5 TAN STRL LF (GAUZE/BANDAGES/DRESSINGS) ×3 IMPLANT
BNDG ESMARK 6X9 LF (GAUZE/BANDAGES/DRESSINGS)
CLIP TI WIDE RED SMALL 6 (CLIP) ×6 IMPLANT
CLOTH BEACON ORANGE TIMEOUT ST (SAFETY) ×3 IMPLANT
CUFF TOURNIQUET SINGLE 34IN LL (TOURNIQUET CUFF) IMPLANT
CUFF TOURNIQUET SINGLE 44IN (TOURNIQUET CUFF) IMPLANT
DRAPE C-ARM 42X72 X-RAY (DRAPES) ×3 IMPLANT
DRAPE ORTHO SPLIT 77X108 STRL (DRAPES) ×2
DRAPE PROXIMA HALF (DRAPES) ×6 IMPLANT
DRAPE SURG 17X23 STRL (DRAPES) ×3 IMPLANT
DRAPE SURG ORHT 6 SPLT 77X108 (DRAPES) ×4 IMPLANT
DRAPE U-SHAPE 47X51 STRL (DRAPES) ×3 IMPLANT
DRILL BIT CALIBRATED (BIT) ×3 IMPLANT
DRILL CROWE POINT TWIST 4.3 (DRILL) ×3
DRSG ADAPTIC 3X8 NADH LF (GAUZE/BANDAGES/DRESSINGS) ×3 IMPLANT
DRSG MEPILEX BORDER 4X4 (GAUZE/BANDAGES/DRESSINGS) ×3 IMPLANT
DRSG MEPILEX BORDER 4X8 (GAUZE/BANDAGES/DRESSINGS) ×3 IMPLANT
DURAPREP 26ML APPLICATOR (WOUND CARE) ×3 IMPLANT
ELECT REM PT RETURN 9FT ADLT (ELECTROSURGICAL) ×3
ELECTRODE REM PT RTRN 9FT ADLT (ELECTROSURGICAL) ×2 IMPLANT
FACESHIELD LNG OPTICON STERILE (SAFETY) ×6 IMPLANT
GLOVE BIOGEL PI IND STRL 8 (GLOVE) ×8 IMPLANT
GLOVE BIOGEL PI INDICATOR 8 (GLOVE) ×4
GLOVE ORTHO TXT STRL SZ7.5 (GLOVE) ×18 IMPLANT
GLOVE SURG ORTHO 8.0 STRL STRW (GLOVE) ×12 IMPLANT
GOWN PREVENTION PLUS XLARGE (GOWN DISPOSABLE) ×9 IMPLANT
GOWN STRL NON-REIN LRG LVL3 (GOWN DISPOSABLE) ×6 IMPLANT
GUIDEWIRE 3.2X32 TROCAR TIP (WIRE) ×3 IMPLANT
GUIDEWIRE BEAD TIP (WIRE) ×3 IMPLANT
KIT BASIN OR (CUSTOM PROCEDURE TRAY) ×3 IMPLANT
KIT ROOM TURNOVER OR (KITS) ×3 IMPLANT
MANIFOLD NEPTUNE II (INSTRUMENTS) ×3 IMPLANT
NAIL FEM RETRO 12X340 (Nail) ×3 IMPLANT
NEEDLE 22X1 1/2 (OR ONLY) (NEEDLE) ×3 IMPLANT
NS IRRIG 1000ML POUR BTL (IV SOLUTION) ×3 IMPLANT
PACK GENERAL/GYN (CUSTOM PROCEDURE TRAY) ×3 IMPLANT
PAD ARMBOARD 7.5X6 YLW CONV (MISCELLANEOUS) ×6 IMPLANT
SCREW CORT TI DBL LEAD 5X65 (Screw) ×3 IMPLANT
SCREW CORT TI DBL LEAD 5X85 (Screw) ×3 IMPLANT
SCREW CORT TI DBLE LEAD 5X54 (Screw) ×3 IMPLANT
SPONGE GAUZE 4X4 12PLY (GAUZE/BANDAGES/DRESSINGS) ×6 IMPLANT
STAPLER VISISTAT 35W (STAPLE) ×3 IMPLANT
STOCKINETTE IMPERVIOUS LG (DRAPES) ×3 IMPLANT
SUT PROLENE 3 0 SH DA (SUTURE) ×3 IMPLANT
SUT PROLENE 4 0 RB 1 (SUTURE) ×1
SUT PROLENE 4-0 RB1 .5 CRCL 36 (SUTURE) ×2 IMPLANT
SUT SILK 2 0 (SUTURE) ×1
SUT SILK 2-0 18XBRD TIE 12 (SUTURE) ×2 IMPLANT
SUT VIC AB 0 CTB1 27 (SUTURE) ×3 IMPLANT
SUT VIC AB 2-0 CTB1 (SUTURE) ×6 IMPLANT
SUT VIC AB 4-0 SH 27 (SUTURE) ×1
SUT VIC AB 4-0 SH 27XBRD (SUTURE) ×2 IMPLANT
SYR CONTROL 10ML LL (SYRINGE) ×3 IMPLANT
TOWEL OR 17X24 6PK STRL BLUE (TOWEL DISPOSABLE) ×3 IMPLANT
TOWEL OR 17X26 10 PK STRL BLUE (TOWEL DISPOSABLE) ×3 IMPLANT
TRAY FOLEY CATH 14FR (SET/KITS/TRAYS/PACK) IMPLANT
WATER STERILE IRR 1000ML POUR (IV SOLUTION) ×3 IMPLANT

## 2012-01-13 SURGICAL SUPPLY — 23 items
BANDAGE ELASTIC 6 VELCRO ST LF (GAUZE/BANDAGES/DRESSINGS) ×2 IMPLANT
CLIP TI MEDIUM 6 (CLIP) ×2 IMPLANT
CLIP TI WIDE RED SMALL 6 (CLIP) ×2 IMPLANT
CUTTER LINEAR ENDO 35 ART THIN (STAPLE) ×2 IMPLANT
DRAIN CHANNEL 10F 3/8 F FF (DRAIN) ×2 IMPLANT
DRSG MEPILEX BORDER 4X8 (GAUZE/BANDAGES/DRESSINGS) ×2 IMPLANT
DRSG PAD ABDOMINAL 8X10 ST (GAUZE/BANDAGES/DRESSINGS) ×2 IMPLANT
GAUZE SPONGE 4X4 12PLY STRL LF (GAUZE/BANDAGES/DRESSINGS) ×2 IMPLANT
GLOVE BIOGEL PI IND STRL 8 (GLOVE) ×1 IMPLANT
GLOVE BIOGEL PI INDICATOR 8 (GLOVE) ×1
GLOVE BIOGEL PI ORTHO PRO SZ8 (GLOVE) ×1
GLOVE PI ORTHO PRO STRL SZ8 (GLOVE) ×1 IMPLANT
GLOVE SURG EUDERMIC 8 LTX PF (GLOVE) ×2 IMPLANT
GOWN PREVENTION PLUS XLARGE (GOWN DISPOSABLE) ×4 IMPLANT
KIT BASIN OR (CUSTOM PROCEDURE TRAY) ×2 IMPLANT
PACK PERIPHERAL VASCULAR (CUSTOM PROCEDURE TRAY) ×2 IMPLANT
SUT PROLENE 5 0 C 1 24 (SUTURE) ×6 IMPLANT
SUT VIC AB 2-0 CT1 18 (SUTURE) ×2 IMPLANT
SUT VIC AB 2-0 SH 18 (SUTURE) ×2 IMPLANT
SUT VIC AB 3-0 SH 27 (SUTURE) ×1
SUT VIC AB 3-0 SH 27X BRD (SUTURE) ×1 IMPLANT
TAPE HYPAFIX 6X30 (GAUZE/BANDAGES/DRESSINGS) ×2 IMPLANT
VITASURE W/FLOW CONTROL (MISCELLANEOUS) ×2 IMPLANT

## 2012-01-13 NOTE — Progress Notes (Signed)
Patient ID: [name] [name], female   DOB: 1940-12-26, 71 y.o.   MRN: [id number] Taken back to or for  hematoma

## 2012-01-13 NOTE — Anesthesia Procedure Notes (Signed)
Procedure Name: Intubation Date/Time: 01/13/2012 9:02 AM Performed by: Elizbeth Squires Pre-anesthesia Checklist: Patient identified, Emergency Drugs available, Suction available and Patient being monitored Patient Re-evaluated:Patient Re-evaluated prior to inductionOxygen Delivery Method: Circle System Utilized Preoxygenation: Pre-oxygenation with 100% oxygen Intubation Type: IV induction Ventilation: Mask ventilation without difficulty and Oral airway inserted - appropriate to patient size Laryngoscope Size: Mac and 3 Grade View: Grade I Tube type: Oral Tube size: 7.5 mm Number of attempts: 1 Airway Equipment and Method: stylet Placement Confirmation: ETT inserted through vocal cords under direct vision,  positive ETCO2 and breath sounds checked- equal and bilateral Secured at: 20 cm Tube secured with: Tape Dental Injury: Teeth and Oropharynx as per pre-operative assessment

## 2012-01-13 NOTE — Op Note (Signed)
OPERATIVE REPORT  Date of Surgery: 01/12/2012 - 01/13/2012  Surgeon: Josephina Gip, MD  Assistant: Dr. Kristeen Miss  Pre-op Diagnosis: bleeding in the groin post repair left femoral fracture  Post-op Diagnosis: Postop hematoma left inguinal region  Procedure: Procedure(s): WOUND EXPLORATION left groin with evacuation hematoma and mobilization left superficial femoral artery and vein Anesthesia: Gen. endotracheal  EBL: 300 cc  Complications: None  Procedure Details: The patient had been taken to 3300 following earlier surgery by Dr. Madelon Lips and developed an expanding hematoma in the left inguinal wound  with hypotension. The patient had received 4 units of packed red blood cells. Her blood pressure was in the 75-85 range systolic. She was immediately returned to the operating room for exploration  Patient was taken to the operating room placed in the supine position at which time satisfactory general endotracheal anesthesia was readministered. The left inguinal area was re\re prepped and draped in routine sterile manner. Skin staples were removed. There was a large hematoma. Subcutaneous tissue sutures were removed and a large hematoma approximating 2 units was evacuated from the wound. This was thoroughly irrigated with saline and there was no pulsatile bleeding noted. The incision was slightly extended distally and again from superficial and going to date all levels were thoroughly explored for any active bleeding. There was no pulsatile bleeding found. Superficial femoral artery and vein had been earlier examined and on this occasion they were mobilized proximally and distally up to the origin of the profunda. There was no hematoma in the sheath and no active bleeding coming from the artery or finding. One of the deep profunda branches did appear to be injured and it was ligated with 3-0 silk tie but no active bleeding had been noted. There were some venous branches which were slightly oozing  and which were ligated this was not brisk bleeding. The wound was exposed down past the femur where the procedure had been performed by Dr. Madelon Lips and there was some mild venous oozing coming from the muscle and the depth of the wound but nothing active or brisk. After performing this procedure the wound was packed for about 5 minutes reexamined and no bleeding was present. Hetastarch-topical thrombotic material was then placed in the depth of the wound. A #10 Jackson-Pratt drain was brought to an inferiorly based stab wound and secured with a nylon suture. The wound is then closed in layers with interrupted 2-0 Vicryl continuous 3-0 Vicryl and skin staples. It was a palpable dorsalis pedis pulse in the foot at the conclusion of the procedure. Dr. Madelon Lips then change the dressing on the distal leg at the level of the knee and the patient was taken to the recovery room in stable condition she received 2 units of packed red blood cells and 2 units of fresh frozen plasma during the procedure and was stable hemodynamically with blood pressure approximating 90-95 systolic the majority of the case.   Josephina Gip, MD 01/13/2012 4:17 PM

## 2012-01-13 NOTE — OR Nursing (Signed)
Received patient in holding area. Had a very strong odor of urine and her linens were soaked with urine. Dr. Christian Mate was informed of odor and pt stating she had foley in for 6 months. Urine bag tubing had pale green- yellowish cloudy urine inside it and foley bag.  Asked to replace foley and was given permission to do so after surgery was complete. When I went to do foley change after surgery, the labia was caked with brown drainage and labia was red and swollen. When drawing back fluid from the balloon it returned a yellowish fluid and did not stop flowing. Balloon was already deflated. Removed catheter and replaced it with a new 14Fr foley. Inserted all the way to the hub, got cloudy-yellow urine, then inflated balloon with 10ml of sterile water.

## 2012-01-13 NOTE — Progress Notes (Signed)
Orthopedic Tech Progress Note Patient Details:  [name] [name] Jun 18, 1941 [id number]  Other Ortho Devices Type of Ortho Device: Other (comment) Ortho Device Location: foot drop boot to left foot Ortho Device Interventions: Application   Gaye Pollack 01/13/2012, 12:33 PM

## 2012-01-13 NOTE — H&P (Signed)
Internal Medicine Attending Admission Note Date: 01/13/2012  Patient name: [name] [name] Medical record number: [id number] Date of birth: 04/29/1941 Age: 71 y.o. Gender: female  I saw and evaluated the patient. I reviewed the resident's note and I agree with the resident's findings and plan as documented in the resident's note.  Chief Complaint(s): Left leg injury following fall  History - key components related to admission: Patient is a 71 year old woman with history of multiple sclerosis, GERD, depression, recurrent UTI, recurrent pneumonia, chronic constipation, and diverticulosis admitted with a spiral distal left femoral fracture.  Patient reports that she fell at home; she did not lose consciousness, and does not recall why she fell.  She otherwise has no acute complaints.    Physical Exam - key components related to admission:  Filed Vitals:   01/13/12 1309 01/13/12 1310 01/13/12 1311 01/13/12 1322  BP:    75/60  Pulse: 85 71  69  Temp:    97.4 F (36.3 C)  TempSrc:    Oral  Resp: 18 22 18 17   SpO2: 95% 95%  93%   Gen.: Alert, no distress Lungs: Clear Heart: Regular; no extra sounds or murmurs Abdomen: Bowel sounds present, soft, nontender Extremities: No edema  Lab results:   Basic Metabolic Panel:  Basename 01/13/12 1034 01/13/12 0700 01/12/12 1316  NA 140 139 --  K 4.2 3.8 --  CL -- 105 105  CO2 -- 25 22  GLUCOSE 98 90 --  BUN -- 12 16  CREATININE -- 0.77 0.77  CALCIUM -- 8.7 8.9  MG -- -- --  PHOS -- -- --    CBC:  Basename 01/13/12 1254 01/13/12 1034 01/13/12 0700 01/12/12 1316  WBC 8.1 -- 4.7 --  NEUTROABS -- -- -- 3.1  HGB 10.6* 6.1* -- --  HCT 33.3* 18.0* -- --  MCV 77.8* -- 69.0* --  PLT 231 -- 286 --     Imaging results:  Dg Pelvis Portable  01/12/2012  *RADIOLOGY REPORT*  Clinical Data: Distal left femur fracture.  PORTABLE PELVIS  Comparison: None.  Findings: 1350 hours.  The left inferior pubic rami and left femoral neck are partly  obscured by the left thigh immobilization device.  There is no evidence of acute pelvic fracture or dislocation.  Mild degenerative changes are present at both hips. Left pelvic calcification may be vascular or reflect a degenerated fibroid.  IMPRESSION: No acute osseous findings demonstrated at the pelvis.  Of note, portions of the left femoral neck and left pubic rami are obscured by the left thigh immobilization device.  Original Report Authenticated By: Gerrianne Scale, M.D.   Dg Chest Portable 1 View  01/12/2012  *RADIOLOGY REPORT*  Clinical Data: Status post fall.  Possible femur fracture.  History of chronic pneumonia and multiple sclerosis.  PORTABLE CHEST - 1 VIEW  Comparison: None.  Findings: 1400 hours.  There is mild patient rotation to the left. Heart size and mediastinal contours are normal.  There is mild volume loss in the left hemithorax with patchy left basilar pulmonary opacity.  The right lung is clear.  No pleural effusion, pneumothorax or acute osseous abnormality is identified.  IMPRESSION: Patchy left basilar pulmonary opacity is associated with volume loss and could reflect chronic scarring or atelectasis.  Without prior examinations, pneumonia cannot be excluded.  Follow-up recommended.  Original Report Authenticated By: Gerrianne Scale, M.D.   Dg Femur Left Port  01/12/2012  *RADIOLOGY REPORT*  Clinical Data: Fall, left femur pain  PORTABLE  LEFT FEMUR - 2 VIEW  Comparison: None.  Findings: There is a spiral type fracture of the distal left femoral metadiaphysis, with one shaft width overlap of the fracture fragments.  Posterior displacement of the distal fracture fragment is noted.  No radiopaque foreign body.  Overlying soft tissue swelling is evident.  IMPRESSION: Spiral type fracture of the distal left femoral metadiaphysis.  Original Report Authenticated By: Harrel Lemon, M.D.     Assessment & Plan by Problem:  1.  Spiral distal left femoral fracture.  Patient is  resting comfortably following intramedullary retrograde femoral nailing by Dr. Madelon Lips today; she is alert and without acute complaint.  Postoperative management as per orthopedics; will need to discuss and coordinate plans for her rehabilitation while the fracture is healing.  Patient has a history of prior fractures, and was osteopenic on a prior DEXA scan done 11/23/2009.  A repeat DEXA scan is warranted, with consideration of treatment for osteopenia given her recurrent fractures.  2.  Multiple sclerosis. Continue home medications.  3.  Other assessment and plans as per resident note.  4.  Attending physician.  Dr. Maurice March will return in the a.m. Monday, January 21.

## 2012-01-13 NOTE — Op Note (Signed)
Dictated [phone]

## 2012-01-13 NOTE — Progress Notes (Signed)
Proceed with surgery as planned per consult note from yesterday.

## 2012-01-13 NOTE — Progress Notes (Signed)
Pt arrived to 3300 @1320  and was hypotensive (see vital signs) At 1330 L groin dressing was saturated and actively bleeding with small hematoma -- began holding pressure. Dr. Maisie Fus paged at 1335 -- received order for 500 ml bolus (completed at 1425). Hematoma marked around groin @1345 . Spoke with Dr. Hart Rochester @1350 , Ortho PA (Josh)@1400  and Dr. Madelon Lips @1415  -- reported above information to each of them and that hematoma was expanding to mid-thigh. Plan of care per Dr. Madelon Lips -- pt will go back to OR and he will call Dr. Hart Rochester. Transported pt to OR with Scientist, clinical (histocompatibility and immunogenetics) (RRT) @ 1430. Renette Butters, Viona Gilmore

## 2012-01-13 NOTE — Anesthesia Postprocedure Evaluation (Signed)
Anesthesia Post Note  Patient: [name] [name]  Procedure(s) Performed:  WOUND EXPLORATION  Anesthesia type: general  Patient location: PACU  Post pain: Pain level controlled  Post assessment: Patient's Cardiovascular Status Stable  Last Vitals:  Filed Vitals:   01/13/12 1415  BP: 101/54  Pulse: 75  Temp:   Resp: 21    Post vital signs: Reviewed and stable  Level of consciousness: sedated  Complications: No apparent anesthesia complications

## 2012-01-13 NOTE — Anesthesia Preprocedure Evaluation (Addendum)
Anesthesia Evaluation  Patient identified by MRN, date of birth, ID band Patient awake    Reviewed: Allergy & Precautions, H&P , NPO status , Patient's Chart, lab work & pertinent test results, reviewed documented beta blocker date and time   Airway Mallampati: II TM Distance: >3 FB Neck ROM: Full    Dental  (+) Missing and Dental Advisory Given,    Pulmonary pneumonia ,          Cardiovascular     Neuro/Psych PSYCHIATRIC DISORDERS Depression  Neuromuscular disease    GI/Hepatic GERD-  Controlled and Medicated,  Endo/Other    Renal/GU  Bladder dysfunction  Foley catheter in place - pt has had one at home for 6 months    Musculoskeletal   Abdominal   Peds  Hematology   Anesthesia Other Findings   Reproductive/Obstetrics                           Anesthesia Physical Anesthesia Plan  ASA: III  Anesthesia Plan: General and General ETT   Post-op Pain Management:    Induction: Intravenous  Airway Management Planned: Oral ETT  Additional Equipment:   Intra-op Plan:   Post-operative Plan: Extubation in OR  Informed Consent: I have reviewed the patients History and Physical, chart, labs and discussed the procedure including the risks, benefits and alternatives for the proposed anesthesia with the patient or authorized representative who has indicated his/her understanding and acceptance.   Dental advisory given  Plan Discussed with: CRNA and Surgeon  Anesthesia Plan Comments:        Anesthesia Quick Evaluation

## 2012-01-13 NOTE — Progress Notes (Signed)
S: patient has returned from PACU is resting quietly with her eyes closed. Nursing staff is holding pressure on her left thigh incision. Patient is conversant and is relatively comfortable. She denies any chest pain, dizziness, shortness of breath. She has some pain in her incision area.  O: BP 101/54  T 97.4  HR 75  RR  21  O2 sat 100% on room air  General: elderly woman resting in bed with eyes closed HEENT: PERRL, EOMI, no scleral icterus Cardiac: RRR, no rubs, murmurs or gallops Pulm: clear to auscultation bilaterally, moving normal volumes of air Abd: soft, nontender, nondistended, BS present Ext: warm and well perfused, no pedal edema. There is tense edema in left groin that extends though left lateral thigh. It has expanded 50% from marked line. Neuro: alert and oriented X3, cranial nerves II-XII grossly intact  Labs:  Hgb 10.0 INR 1.33 BMP wnl  A/P:  Femoral Shaft Fracture: Patient is s/p retrograde nailing this AM  Blood Loss: Patient lost 1100 mL in OR, received 4 units of blood -gave her bolus and started her on 258mL/hr normal saline -orthopedics/vascular surgery will take back to OR to stop bleeding -will monitor CBC, type & screened 4 additional units  UTI: treating with IV ceftriaxone  MS: continue home meds Chronic constipation: continue home meds

## 2012-01-13 NOTE — Op Note (Signed)
OPERATIVE REPORT  Date of Surgery: 01/12/2012 - 01/13/2012  Surgeon Dr. Kristeen Miss Consulting surgeon Dr. Betti Cruz  Intraoperative diagnosis hemorrhage and left inguinal wound during repair of left femoral fracture  Post-op Diagnosis: Same  Procedure: Procedure(s): WOUND EXPLORATION and ligation of the perforating arterial branches-left groin Anesthesia: Gen.  Complications: None  Procedure Details: Dr. Madelon Lips requested a consult by Dr. Hart Rochester intraoperatively because of hemorrhage in the left inguinal area which had occurred during repair of left femoral shaft fracture. Dr. Madelon Lips had extended the incision in the left inguinal area and explored the area and apparently there was significant bleeding with 3-4 units of blood being transfused. Blood pressure was in the 75-90 systolic range. I explored the wound both superficially and deep. The superficial femoral artery and vein were identified and the sheath was opened and there was no hematoma within the sheath. The bleeding seemed to be coming from the depth of the wound which was lateral to the vessels. The femoral nerve trunk was not injured and was lateral to the exploration. There was a deep perforating branch-arterial which was identified that had been bleeding and this was ligated proximally and distally with 2-0 silk ties and divided. There was also one prominent venous branch which had been bleeding which was also ligated in a similar fashion. This was then examined and no pulsatile or brisk bleeding was occurring. Dr. Madelon Lips then concluded his procedure and close the wound with Vicryl and clips .  Josephina Gip, MD 01/13/2012 4:26 PM

## 2012-01-13 NOTE — Anesthesia Preprocedure Evaluation (Addendum)
Anesthesia Evaluation  Patient identified by MRN, date of birth, ID band Patient awake    Reviewed: Allergy & Precautions, H&P , Patient's Chart, lab work & pertinent test results  Airway Mallampati: II TM Distance: >3 FB Neck ROM: full    Dental   Pulmonary          Cardiovascular  Hypotension secondary to blood loss   Neuro/Psych PSYCHIATRIC DISORDERS Depression  Neuromuscular disease    GI/Hepatic   Endo/Other    Renal/GU      Musculoskeletal   Abdominal   Peds  Hematology   Anesthesia Other Findings   Reproductive/Obstetrics                          Anesthesia Physical Anesthesia Plan  ASA: III and Emergent  Anesthesia Plan: General ETT   Post-op Pain Management:    Induction:   Airway Management Planned:   Additional Equipment:   Intra-op Plan:   Post-operative Plan:   Informed Consent: I have reviewed the patients History and Physical, chart, labs and discussed the procedure including the risks, benefits and alternatives for the proposed anesthesia with the patient or authorized representative who has indicated his/her understanding and acceptance.     Plan Discussed with: CRNA and Surgeon  Anesthesia Plan Comments:         Anesthesia Quick Evaluation

## 2012-01-13 NOTE — Transfer of Care (Signed)
Immediate Anesthesia Transfer of Care Note  Patient: [name] [name]  Procedure(s) Performed:  WOUND EXPLORATION  Patient Location: PACU  Anesthesia Type: General  Level of Consciousness: oriented and sedated  Airway & Oxygen Therapy: Patient Spontanous Breathing and Patient connected to face mask oxygen  Post-op Assessment: Report given to PACU RN and Post -op Vital signs reviewed and stable  Post vital signs: Reviewed and stable  Complications: No apparent anesthesia complications

## 2012-01-13 NOTE — Preoperative (Signed)
Beta Blockers   Reason not to administer Beta Blockers: not prescribed 

## 2012-01-13 NOTE — Anesthesia Postprocedure Evaluation (Signed)
Anesthesia Post Note  Patient: [name] [name]  Procedure(s) Performed:  INTRAMEDULLARY (IM) RETROGRADE FEMORAL NAILING; FEMORAL ARTERY EXPLORATION  Anesthesia type: general  Patient location: PACU  Post pain: Pain level controlled  Post assessment: Patient's Cardiovascular Status Stable  Last Vitals:  Filed Vitals:   01/13/12 1137  BP:   Pulse:   Temp: 36.4 C  Resp:     Post vital signs: Reviewed and stable  Level of consciousness: sedated  Complications: No apparent anesthesia complications

## 2012-01-13 NOTE — Progress Notes (Signed)
Pt arrived for 2nd PACU visit. Chart already had arrival/completion times entered from first PACU visit. I had to delete those times in order to place pt in PACU for 2nd visit. Ist visit times are as follows: In Recovery 1137, Out of Recovery 1312, Recovery Care Complete 1322, Procedural Care Complete 1323.

## 2012-01-13 NOTE — Transfer of Care (Signed)
Immediate Anesthesia Transfer of Care Note  Patient: [name] [name]  Procedure(s) Performed:  INTRAMEDULLARY (IM) RETROGRADE FEMORAL NAILING; FEMORAL ARTERY EXPLORATION  Patient Location: PACU  Anesthesia Type: General  Level of Consciousness: awake, alert  and oriented  Airway & Oxygen Therapy: Patient Spontanous Breathing and Patient connected to nasal cannula oxygen  Post-op Assessment: Report given to PACU RN, Post -op Vital signs reviewed and stable and Patient moving all extremities  Post vital signs: Reviewed and stable  Complications: No apparent anesthesia complications

## 2012-01-14 DIAGNOSIS — R578 Other shock: Secondary | ICD-10-CM | POA: Diagnosis not present

## 2012-01-14 DIAGNOSIS — T83511A Infection and inflammatory reaction due to indwelling urethral catheter, initial encounter: Secondary | ICD-10-CM | POA: Diagnosis present

## 2012-01-14 DIAGNOSIS — N390 Urinary tract infection, site not specified: Secondary | ICD-10-CM | POA: Diagnosis present

## 2012-01-14 DIAGNOSIS — S72309A Unspecified fracture of shaft of unspecified femur, initial encounter for closed fracture: Secondary | ICD-10-CM | POA: Diagnosis present

## 2012-01-14 LAB — CBC
HCT: 27.4 % — ABNORMAL LOW (ref 36.0–46.0)
HCT: 29.5 % — ABNORMAL LOW (ref 36.0–46.0)
Hemoglobin: 10.8 g/dL — ABNORMAL LOW (ref 12.0–15.0)
Hemoglobin: 9.1 g/dL — ABNORMAL LOW (ref 12.0–15.0)
MCH: 25.7 pg — ABNORMAL LOW (ref 26.0–34.0)
MCH: 26 pg (ref 26.0–34.0)
MCH: 26.6 pg (ref 26.0–34.0)
MCHC: 33 g/dL (ref 30.0–36.0)
MCHC: 33.2 g/dL (ref 30.0–36.0)
MCHC: 33.9 g/dL (ref 30.0–36.0)
MCV: 78.5 fL (ref 78.0–100.0)
MCV: 78.6 fL (ref 78.0–100.0)
Platelets: 198 10*3/uL (ref 150–400)
RDW: 19.7 % — ABNORMAL HIGH (ref 11.5–15.5)
WBC: 6.5 10*3/uL (ref 4.0–10.5)

## 2012-01-14 LAB — BASIC METABOLIC PANEL
BUN: 11 mg/dL (ref 6–23)
Calcium: 7.9 mg/dL — ABNORMAL LOW (ref 8.4–10.5)
GFR calc non Af Amer: 85 mL/min — ABNORMAL LOW (ref >90)
Glucose, Bld: 99 mg/dL (ref 70–99)
Potassium: 4 mEq/L (ref 3.5–5.1)

## 2012-01-14 LAB — PROTIME-INR: Prothrombin Time: 17.1 seconds — ABNORMAL HIGH (ref 11.6–15.2)

## 2012-01-14 MED ORDER — SODIUM CHLORIDE 0.9 % IV SOLN
INTRAVENOUS | Status: DC
  Administered 2012-01-15: 15:00:00 via INTRAVENOUS
  Administered 2012-01-16: 50 mL/h via INTRAVENOUS
  Administered 2012-01-17 – 2012-01-18 (×2): via INTRAVENOUS

## 2012-01-14 MED ORDER — BIOTENE DRY MOUTH MT LIQD
15.0000 mL | Freq: Two times a day (BID) | OROMUCOSAL | Status: DC
  Administered 2012-01-14 – 2012-01-20 (×12): 15 mL via OROMUCOSAL
  Filled 2012-01-14: qty 15

## 2012-01-14 NOTE — Progress Notes (Signed)
Subjective: Patient is doing well is somewhat shaken up from being taken back to OR yesterday but otherwise has no complaints  Objective: Vital signs in last 24 hours: Filed Vitals:   01/14/12 1228 01/14/12 1300 01/14/12 1400 01/14/12 1500  BP:  110/57 107/48 95/36  Pulse:  103 106 102  Temp: 97.7 F (36.5 C)     TempSrc: Oral     Resp:  21 22 23   Height:      Weight:      SpO2:  98% 97% 98%   Weight change:   Intake/Output Summary (Last 24 hours) at 01/14/12 1544 Last data filed at 01/14/12 1500  Gross per 24 hour  Intake   2710 ml  Output   2610 ml  Net    100 ml   Physical Exam: General: elderly woman resting in bed, eating jello HEENT: PERRL, EOMI, no scleral icterus Cardiac: RRR, no rubs, murmurs or gallops Pulm: clear to auscultation bilaterally, moving normal volumes of air Abd: soft, nontender, nondistended, BS present Ext: warm and well perfused, no pedal edema Neuro: alert and oriented X3, cranial nerves II-XII grossly intact  Lab Results: Basic Metabolic Panel:  Lab 01/14/12 1610 01/13/12 1034 01/13/12 0700  NA 136 140 --  K 4.0 4.2 --  CL 105 -- 105  CO2 24 -- 25  GLUCOSE 99 98 --  BUN 11 -- 12  CREATININE 0.72 -- 0.77  CALCIUM 7.9* -- 8.7  MG -- -- --  PHOS -- -- --   CBC:  Lab 01/14/12 0730 01/13/12 2349 01/13/12 1450 01/12/12 1316  WBC 6.0 6.8 -- --  NEUTROABS -- -- 5.7 3.1  HGB 9.1* 10.8* -- --  HCT 27.4* 32.7* -- --  MCV 77.4* 78.6 -- --  PLT 198 189 -- --   Coagulation:  Lab 01/14/12 0730 01/13/12 1450  LABPROT 17.1* 16.7*  INR 1.37 1.33   Micro Results: Recent Results (from the past 240 hour(s))  URINE CULTURE     Status: Normal   Collection Time   01/12/12  2:35 PM      Component Value Range Status Comment   Specimen Description URINE, CLEAN CATCH   Final    Special Requests NONE   Final    Setup Time [id number]   Final    Colony Count >=100,000 COLONIES/ML   Final    Culture     Final    Value: Multiple bacterial  morphotypes present, none predominant. Suggest appropriate recollection if clinically indicated.   Report Status 01/13/2012 FINAL   Final   SURGICAL PCR SCREEN     Status: Normal   Collection Time   01/13/12  1:24 AM      Component Value Range Status Comment   MRSA, PCR NEGATIVE  NEGATIVE  Final    Staphylococcus aureus NEGATIVE  NEGATIVE  Final   MRSA PCR SCREENING     Status: Normal   Collection Time   01/13/12  1:31 PM      Component Value Range Status Comment   MRSA by PCR NEGATIVE  NEGATIVE  Final    Studies/Results: Dg Femur Left  01/13/2012  *RADIOLOGY REPORT*  Clinical Data: Intraoperative ORIF left femoral fracture  LEFT FEMUR - 2 VIEW  Comparison: 01/12/2012  Findings: Four intraoperative fluoroscopic images demonstrate a left femoral intramedullary rod fixation of the previously seen metadiaphyseal fracture.  There remains posterior angulation of the fracture fragments.  No evidence for hardware failure.  No new fracture. On presumed frontal projection,  there is one half shaft width overlap of fracture fragments.  IMPRESSION: Intraoperative fixation of left femoral fracture as above.  Original Report Authenticated By: Harrel Lemon, M.D.   Dg Femur Left Port  01/13/2012  *RADIOLOGY REPORT*  Clinical Data: Status post fracture fixation.  PORTABLE LEFT FEMUR - 2 VIEW  Comparison: Plain films 01/12/2012.  Findings: The patient has a new IM nail with three distal interlocking screws traversing a diaphyseal fracture through the junction of the middle and distal thirds of the left femur.  There is mild medial and posterior displacement.  Gas in the soft tissues and surgical staples noted.  IMPRESSION: Status post IM nailing of a distal femur fracture as described.  Original Report Authenticated By: Bernadene Bell. Maricela Curet, M.D.   Medications: I have reviewed the patient's current medications. Scheduled Meds:   . amitriptyline  100 mg Oral QHS  . antiseptic oral rinse  15 mL Mouth Rinse  BID  .  ceFAZolin (ANCEF) IV  2 g Intravenous Q6H  . ciprofloxacin  400 mg Intravenous Q12H  . dalfampridine  10 mg Oral BID  . docusate sodium  200 mg Oral BID  . FLUoxetine  40 mg Oral Daily  . gabapentin  300 mg Oral QHS  . gabapentin  300 mg Oral Q supper  . gabapentin  600 mg Oral Q lunch  . multivitamins ther. w/minerals  1 tablet Oral Daily  . pantoprazole  40 mg Oral BID AC  . vitamin C  500 mg Oral BID  . DISCONTD: sodium chloride   Intravenous Once  . DISCONTD: enoxaparin  30 mg Subcutaneous Q12H   Continuous Infusions:   . DISCONTD: sodium chloride     PRN Meds:.acetaminophen, acetaminophen, bisacodyl, HYDROmorphone, magnesium citrate, metoCLOPramide (REGLAN) injection, ondansetron (ZOFRAN) IV, ondansetron, oxyCODONE, senna-docusate, DISCONTD: HYDROmorphone, DISCONTD: meperidine, DISCONTD: morphine, DISCONTD: ondansetron (ZOFRAN) IV Assessment/Plan: A/P:  Femoral Shaft Fracture:  Patient is s/p retrograde nailing yesterday -cefazolin per orthopedics  Blood Loss anemia:  Patient lost 1100 mL in OR, received 4 units of blood, then 2 additional units on re-exploration of wound  -restarted on 55mL/hour fluids since patient is somewhat tachycardic  -will monitor CBC and consider transfusion tomorrow AM if Hgb<8, will hold currently  UTI: treating with IV ceftriaxone since previous culture was resistant to cipro, bactrim  MS: continue home meds   Chronic constipation: continue home meds    LOS: 2 days   Margorie John 01/14/2012, 3:44 PM

## 2012-01-14 NOTE — Progress Notes (Signed)
OT Note:  Order received and chart reviewed. Noted that patient is still on bedrest. Please update activity orders as appropriate. Also, noted PT order was discontinued. Please re-order PT as appropriate as well. Thank you!  Glendale Chard, OTR/L Pager: [phone] 01/14/2012

## 2012-01-14 NOTE — Clinical Documentation Improvement (Signed)
Anemia Blood Loss Clarification  THIS DOCUMENT IS NOT A PERMANENT PART OF THE MEDICAL RECORD  RESPOND TO THE THIS QUERY, FOLLOW THE INSTRUCTIONS BELOW:  1. If needed, update documentation for the patient's encounter via the notes activity.  2. Access this query again and click edit on the In [employer].  3. After updating, or not, click F2 to complete all highlighted (required) fields concerning your review. Select "additional documentation in the medical record" OR "no additional documentation provided".  4. Click Sign note button.  5. The deficiency will fall out of your In Basket *Please let us know if you are not able to complete this workflow by phone or e-mail (listed below).        01/14/12  Dear Drs. Timothy Lane/ S. Maisie Fus  /Associates  In an effort to better capture your patient's severity of illness, reflect appropriate length of stay and utilization of resources, a review of the patient medical record has revealed the following indicators.    Based on your clinical judgment, please clarify and document in a progress note and/or discharge summary the clinical condition associated with the following supporting information:  In responding to this query please exercise your independent judgment.  The fact that a query is asked, does not imply that any particular answer is desired or expected.  Noted EBL 1500 during OR procedure with hematoma occuring postop with hypotension...patient transfused  With 4 - 6 unit of PRBC and 2 units of FFP.  Please clarify appropriate secondary diagnosis to explain H/H 10.1/34.0 ( 1/19)  to 6.1/ 18.0 (1/20) if known.  Thank you   Possible Clinical Conditions?   " Expected Acute Blood Loss Anemia  " Acute Blood Loss Anemia  " Acute on chronic blood loss anemia  " Chronic blood loss anemia  " Precipitous drop in Hematocrit  " Other Condition________________  " Cannot Clinically Determine   Risk Factors: s/p ORIF of left femur fx s/p  EBL   Diagnostics: H&H on admit: 1/19 ...10.1/34.0  Post OP H&H: 1/20 ...6.1/ 18.0  H&H after TX: 1/20 ...10.9/33.8  Treatments:In OR transfused 3-4 units PRBC  Transfusion: 3-6 PRBC, 2 units FFP  IV fluids / plasma expanders: NS @ 211ml/ during OR then 268ml/hr, in OR 500 ml /IV Hestend  Serial H&H monitoring: CBC   Reviewed: per coder  Doc ABLA captured   Thank You,  Lavonda Jumbo  Clinical Documentation Specialist RN, BSN, CDS:  Pager [phone]  Health Information Management [place]

## 2012-01-14 NOTE — Progress Notes (Signed)
Noted order for [facility] needs and DME, also noted that pt is client of [facility] with [facility] aide assistance and [facility] 5 days per week. Given current femur fx and complications post op , return to home seems unlikely as pt will need 24 hr assistance.  Will follow for needs and notify CSW of possible placement for rehab.  Johny Shock RN MPH Case Manager [phone]

## 2012-01-14 NOTE — Op Note (Signed)
NAMEEBANY, [name] [name] NO.:  [id number]  MEDICAL RECORD NO.:  [id number]  LOCATION:  2310                         FACILITY:  [facility]  PHYSICIAN:  Dyke Brackett, M.D.    DATE OF BIRTH:  [birth year]  DATE OF PROCEDURE: DATE OF DISCHARGE:                              OPERATIVE REPORT   PREOPERATIVE DIAGNOSIS:  Comminuted distal third femur fracture.  POSTOPERATIVE DIAGNOSIS:  Comminuted distal third femur fracture.  OPERATION: 1. Closed reduction and insertion of [employer] [place] retrograde femoral     nail, 34 cm x 12 mm diameter nail. 2. Ligation of femoral perforators.  SURGEON:  Dyke Brackett, MD  DESCRIPTION OF PROCEDURE:  The patient was sterilely prepped and draped on the radiolucent table.  We entered the distal femur to a transpatellar tendon incision.  Entry point was made close to the attachment site of the just anterior to the PCL in the knee.  I confirmed this position in the AP and lateral plane, placed the guide pin and then over-reamed with a proximal reamer to enlarge the distal hole.  The fracture itself was significantly more comminuted initially. On the  x-ray, there was an obliquity in the butterfly fragment that became immediately apparent.  My view was I am looking at this, the patient would benefit from nailing, but she was a transfer patient that has significant MS and when I looked at the reduction we obtained with a guide pin and although there was some mild shortening in flexion of the distal fragment.  My feeling was that the rod, despite this, was in good position and would suffice relative to the patient's ambulatory status. We then placed a guide pin across the fracture site up to about 1 cm proximal lesser troch and then progressively reamed 1.5 mm up to accept the above-mentioned nail.  We then placed the rod without difficulty and placed 3 transfixion screws using the [facility] system from lateral to medial transverse and 1 oblique.   Bone quality was poor although the 3 screws I think, that 1 of 3 fit well.  We did elect to insert the device into the intramedullary canal.  I had to basically lock these screws into place.  Attention to next was directed at the proximal femur area under direct guidance with small stab wounds using the 4-0 as a guide, placed a drill bit on the circular hole.  Before drilling this hole, there was noticeably some bleeding coming from the incision that I thought was excessive relative to what we would expect.  This was just to enlarge this incision under direct visualization and additionally, we identified 2 perforating branches of the femoral artery that it sheared.  I had clamped these off.  It was not clear at that point whether this represented as it turned out to be 2 small perforators and due to this fact, I called Dr. Hart Rochester, vascular surgeon in and who confirmed these were small perforators.  He ligated with clips the small branches, he explored the femoral artery in a satisfaction and there was no significant injury to artery noted.  It was felt to possibly be due to the dissection with  the drill bit although drill bit actually was never put on power, that this may cause shear of these perforators to cause the bleeding.  We intraoperatively transfused the patient approximately 3-4 units of blood.  She was noted to be stable throughout the procedure as well.  In view of that fact, I elected to that point to not proceed with proximal interlock in my own view while the patient was stable. The degree of shortening that we saw based on the patient's ambulatory status to be mainly transverse, I elected not to proceed with potential, I thought, because I noted no breakdown in technique to cause the problem in the first place, but I was concerned with friability of tissue and vessels that any further attempts do the proximal interlock may have lead to further problems and elected not to  do the proximal interlock for those reasons.  The wounds were irrigated.  We closed the patellar tendon with #1 Vicryl and the subcu tissues with 2-0 Vicryl, the skin with skin clips.  Marcaine without epinephrine in the wounds, we placed a lightly compressive sterile dressing with a knee wrap on, and she was taken to recovery room in stable condition.     Dyke Brackett, M.D.     WDC/MEDQ  D:  01/13/2012  T:  01/13/2012  Job:  [id number]

## 2012-01-14 NOTE — Progress Notes (Signed)
Subjective: 1 Day Post-Op Procedure(s) (LRB): INTRAMEDULLARY (IM) RETROGRADE FEMORAL NAILING (Left) FEMORAL ARTERY EXPLORATION (Left) Patient reports pain as mild.    Objective: Vital signs in last 24 hours: Temp:  [97.4 F (36.3 C)-98.9 F (37.2 C)] 98.1 F (36.7 C) (01/21 1621) Pulse Rate:  [78-107] 102  (01/21 1600) Resp:  [0-27] 24  (01/21 1600) BP: (89-110)/(32-72) 89/34 mmHg (01/21 1600) SpO2:  [91 %-98 %] 95 % (01/21 1600) Weight:  [93 kg (205 lb 0.4 oz)] 93 kg (205 lb 0.4 oz) (01/21 0000)  Intake/Output from previous day: 01/20 0701 - 01/21 0700 In: 8319 [P.O.:360; I.V.:3470; Blood:2739; IV Piggyback:1750] Out: 3235 [Urine:1425; Drains:310; Blood:1500] Intake/Output this shift: Total I/O In: 1140 [P.O.:1000; I.V.:90; IV Piggyback:50] Out: 1175 [Urine:1125; Drains:50]   Basename 01/14/12 0730 01/13/12 2349 01/13/12 1700 01/13/12 1450 01/13/12 1254  HGB 9.1* 10.8* 10.9* 10.0* 10.6*    Basename 01/14/12 0730 01/13/12 2349  WBC 6.0 6.8  RBC 3.54* 4.16  HCT 27.4* 32.7*  PLT 198 189    Basename 01/14/12 0730 01/13/12 1034 01/13/12 0700  NA 136 140 --  K 4.0 4.2 --  CL 105 -- 105  CO2 24 -- 25  BUN 11 -- 12  CREATININE 0.72 -- 0.77  GLUCOSE 99 98 --  CALCIUM 7.9* -- 8.7    Basename 01/14/12 0730 01/13/12 1450  LABPT -- --  INR 1.37 1.33    Incision: dressing C/D/I  Assessment/Plan: 1 Day Post-Op Procedure(s) (LRB): INTRAMEDULLARY (IM) RETROGRADE FEMORAL NAILING (Left) FEMORAL ARTERY EXPLORATION (Left) Up with therapy tomorrow NWB LLE, bed to chair transfers with RLE Dressing change tomorrow Transfuse 1 unit PRBC, recheck CBC Continue to monitor JP output  Kylee Umana 01/14/2012, 4:56 PM

## 2012-01-14 NOTE — Progress Notes (Signed)
Internal Medicine Teaching Service Attending Note Date: 01/14/2012  Patient name: [name] [name]  Medical record number: [id number]  Date of birth: August 29, 1941    This patient has been seen and discussed with the house staff. Please see their note for complete details. I concur with their findings with the following additions/corrections:Mild pain and postop bleeding appears to be resolving. Will need DVT prophylaxis but will wait a bit.[address]  Lina Sayre 01/14/2012, 4:45 PM

## 2012-01-14 NOTE — Progress Notes (Signed)
Patient ID: [name] [name], female   DOB: 12-08-1941, 71 y.o.   MRN: [id number] Vascular Surgery Progress Note  Subjective: She has been stable hemodynamically overnight. States that the discomfort in her left leg is decreasing drainage has been a total of 310 cc since returning from the operating room out of the JP drain. Only 30 cc in the past few hours.  Objective:  Filed Vitals:   01/14/12 0800  BP: 90/66  Pulse: 103  Temp:   Resp: 27    General alert and oriented x3 in no apparent distress Left thigh dressing is dry with no large hematoma palpable 2+ dorsalis pedis pulse palpable left foot was pink well perfused extremity   Labs:  Lab 01/14/12 0730 01/13/12 0700 01/12/12 1316  CREATININE 0.72 0.77 0.77    Lab 01/14/12 0730 01/13/12 1034 01/13/12 0700 01/12/12 1316  NA 136 140 139 --  K 4.0 4.2 3.8 --  CL 105 -- 105 105  CO2 24 -- 25 22  BUN 11 -- 12 16  CREATININE 0.72 -- 0.77 0.77  LABGLOM -- -- -- --  GLUCOSE 99 -- -- --  CALCIUM 7.9* -- 8.7 8.9    Lab 01/14/12 0730 01/13/12 2349 01/13/12 1700  WBC 6.0 6.8 7.4  HGB 9.1* 10.8* 10.9*  HCT 27.4* 32.7* 33.8*  PLT 198 189 209    Lab 01/14/12 0730 01/13/12 1450  INR 1.37 1.33    I/O last 3 completed shifts: In: 8799 [P.O.:840; I.V.:3470; Blood:2739; IV Piggyback:1750] Out: 5035 [Urine:3225; Drains:310; Blood:1500]  Imaging: Dg Femur Left  01/13/2012  *RADIOLOGY REPORT*  Clinical Data: Intraoperative ORIF left femoral fracture  LEFT FEMUR - 2 VIEW  Comparison: 01/12/2012  Findings: Four intraoperative fluoroscopic images demonstrate a left femoral intramedullary rod fixation of the previously seen metadiaphyseal fracture.  There remains posterior angulation of the fracture fragments.  No evidence for hardware failure.  No new fracture. On presumed frontal projection, there is one half shaft width overlap of fracture fragments.  IMPRESSION: Intraoperative fixation of left femoral fracture as above.  Original Report  Authenticated By: Harrel Lemon, M.D.   Dg Pelvis Portable  01/12/2012  *RADIOLOGY REPORT*  Clinical Data: Distal left femur fracture.  PORTABLE PELVIS  Comparison: None.  Findings: 1350 hours.  The left inferior pubic rami and left femoral neck are partly obscured by the left thigh immobilization device.  There is no evidence of acute pelvic fracture or dislocation.  Mild degenerative changes are present at both hips. Left pelvic calcification may be vascular or reflect a degenerated fibroid.  IMPRESSION: No acute osseous findings demonstrated at the pelvis.  Of note, portions of the left femoral neck and left pubic rami are obscured by the left thigh immobilization device.  Original Report Authenticated By: Gerrianne Scale, M.D.   Dg Chest Portable 1 View  01/12/2012  *RADIOLOGY REPORT*  Clinical Data: Status post fall.  Possible femur fracture.  History of chronic pneumonia and multiple sclerosis.  PORTABLE CHEST - 1 VIEW  Comparison: None.  Findings: 1400 hours.  There is mild patient rotation to the left. Heart size and mediastinal contours are normal.  There is mild volume loss in the left hemithorax with patchy left basilar pulmonary opacity.  The right lung is clear.  No pleural effusion, pneumothorax or acute osseous abnormality is identified.  IMPRESSION: Patchy left basilar pulmonary opacity is associated with volume loss and could reflect chronic scarring or atelectasis.  Without prior examinations, pneumonia cannot be excluded.  Follow-up  recommended.  Original Report Authenticated By: Gerrianne Scale, M.D.   Dg Femur Left Port  01/13/2012  *RADIOLOGY REPORT*  Clinical Data: Status post fracture fixation.  PORTABLE LEFT FEMUR - 2 VIEW  Comparison: Plain films 01/12/2012.  Findings: The patient has a new IM nail with three distal interlocking screws traversing a diaphyseal fracture through the junction of the middle and distal thirds of the left femur.  There is mild medial and posterior  displacement.  Gas in the soft tissues and surgical staples noted.  IMPRESSION: Status post IM nailing of a distal femur fracture as described.  Original Report Authenticated By: Bernadene Bell. D'ALESSIO, M.D.   Dg Femur Left Port  01/12/2012  *RADIOLOGY REPORT*  Clinical Data: Fall, left femur pain  PORTABLE LEFT FEMUR - 2 VIEW  Comparison: None.  Findings: There is a spiral type fracture of the distal left femoral metadiaphysis, with one shaft width overlap of the fracture fragments.  Posterior displacement of the distal fracture fragment is noted.  No radiopaque foreign body.  Overlying soft tissue swelling is evident.  IMPRESSION: Spiral type fracture of the distal left femoral metadiaphysis.  Original Report Authenticated By: Harrel Lemon, M.D.    Assessment/Plan:  POD #1 LOS: 2 days  s/p Procedure(s): INTRAMEDULLARY (IM) RETROGRADE FEMORAL NAILING FEMORAL ARTERY EXPLORATION-left leg  Hematocrit has diminished from 32-27 over past 12 hours. JP drainage is decreasing. INR is 1.3. She is stable hemodynamically with no expanding hematoma and growing. Would recommend transfusion one unit packed red blood cells and follow CBC and JP drainage.   Josephina Gip, MD 01/14/2012 8:56 AM

## 2012-01-15 ENCOUNTER — Encounter (HOSPITAL_COMMUNITY): Payer: Self-pay | Admitting: Orthopedic Surgery

## 2012-01-15 LAB — BASIC METABOLIC PANEL
BUN: 9 mg/dL (ref 6–23)
CO2: 26 mEq/L (ref 19–32)
Calcium: 8.3 mg/dL — ABNORMAL LOW (ref 8.4–10.5)
Creatinine, Ser: 0.77 mg/dL (ref 0.50–1.10)
Glucose, Bld: 103 mg/dL — ABNORMAL HIGH (ref 70–99)

## 2012-01-15 LAB — CBC
HCT: 30.9 % — ABNORMAL LOW (ref 36.0–46.0)
MCH: 26.2 pg (ref 26.0–34.0)
MCV: 80.1 fL (ref 78.0–100.0)
Platelets: 195 10*3/uL (ref 150–400)
RBC: 3.86 MIL/uL — ABNORMAL LOW (ref 3.87–5.11)

## 2012-01-15 MED ORDER — POLYETHYLENE GLYCOL 3350 17 G PO PACK
17.0000 g | PACK | Freq: Every day | ORAL | Status: DC
  Administered 2012-01-17: 17 g via ORAL
  Filled 2012-01-15 (×7): qty 1

## 2012-01-15 NOTE — Progress Notes (Signed)
Physical Therapy Evaluation Patient Details Name: [name] [name] MRN: [id number] DOB: 09-17-1941 Today's Date: 01/15/2012  Problem List:  Patient Active Problem List  Diagnoses  . HERPES ZOSTER W/NERVOUS COMPLICATION NEC  . CANDIDIASIS  . OBESITY  . DEPRESSION  . CAUSALGIA  . PERIPHERAL VASCULAR DISEASE  . URI  . PNEUMONIA  . BRONCHITIS NOS  . GASTROESOPHAGEAL REFLUX DISEASE  . RENAL FAILURE, ACUTE  . CYSTITIS  . PRESSURE ULCER HIP  . ULCER OF ANKLE  . LEG PAIN, LEFT  . OSTEOPENIA  . FATIGUE  . OTHER GENERAL SYMPTOMS  . SKIN RASH  . EDEMA LEG  . WEIGHT LOSS  . DYSURIA  . OTHER CLOSED FRACTURES OF UPPER END OF HUMERUS  . LIVER FUNCTION TESTS, ABNORMAL, HX OF  . CHOLECYSTECTOMY, HX OF  . POSTMENOPAUSAL STATUS  . MULTIPLE SCLEROSIS  . Femoral shaft fracture  . Blood loss anemia  . Infection due to urinary indwelling catheter    Past Medical History:  Past Medical History  Diagnosis Date  . Multiple sclerosis   . GERD (gastroesophageal reflux disease)   . Depression   . Recurrent UTI   . Recurrent pneumonia   . Chronic constipation   . Diverticulosis    Past Surgical History:  Past Surgical History  Procedure Date  . Cholecystectomy   . Orif humeral shaft fracture 01/2011  . Video assisted thoracoscopy 06/2010  . Closed reduction shoulder dislocation 04/2010    Fracture dislocation  . Femur im nail 01/13/2012    Procedure: INTRAMEDULLARY (IM) RETROGRADE FEMORAL NAILING;  Surgeon: Thera Flake., MD;  Location: [facility] OR;  Service: Orthopedics;  Laterality: Left;  . Femoral artery exploration 01/13/2012    Procedure: FEMORAL ARTERY EXPLORATION;  Surgeon: Pryor Ochoa, MD;  Location: [facility] OR;  Service: Vascular;  Laterality: Left;    PT Assessment/Plan/Recommendation PT Assessment Clinical Impression Statement: pt presents s/p femur fx with IM nail.  pt with very limited mobility premorbidly, requiring A for all aspects of care.  pt now requires 2 person A for all  mobility and is unable to maintain NWBing on L LE.  pt leans L and rotated L with pt c/o her L side always being weaker, but unaware of what caused this.   PT Recommendation/Assessment: Patient will need skilled PT in the acute care venue PT Problem List: Decreased strength;Decreased activity tolerance;Decreased balance;Decreased mobility;Decreased knowledge of use of DME;Decreased knowledge of precautions;Cardiopulmonary status limiting activity;Pain Barriers to Discharge: Decreased caregiver support PT Therapy Diagnosis : Generalized weakness;Acute pain PT Plan PT Frequency: Min 3X/week PT Treatment/Interventions: DME instruction;Functional mobility training;Therapeutic activities;Therapeutic exercise;Balance training;Neuromuscular re-education;Patient/family education;Wheelchair mobility training PT Recommendation Follow Up Recommendations: Skilled nursing facility Equipment Recommended: Defer to next venue PT Goals  Acute Rehab PT Goals PT Goal Formulation: With patient Time For Goal Achievement: 2 weeks Pt will go Supine/Side to Sit: with mod assist PT Goal: Supine/Side to Sit - Progress: Goal set today Pt will go Sit to Supine/Side: with mod assist PT Goal: Sit to Supine/Side - Progress: Goal set today Pt will Transfer Bed to Chair/Chair to Bed: with mod assist PT Transfer Goal: Bed to Chair/Chair to Bed - Progress: Goal set today Additional Goals Additional Goal #1: pt will be able to maintain NWBing L LE during transfers.   PT Goal: Additional Goal #1 - Progress: Goal set today  PT Evaluation Precautions/Restrictions  Precautions Precautions: Fall Restrictions Weight Bearing Restrictions: Yes LLE Weight Bearing: Non weight bearing Prior Functioning  Home Living Lives With:  Alone Receives Help From: Personal care attendant (Aide 6a-8a, PACE 8a-5p, Aide 6p-10p) Type of Home: Apartment Home Layout: One level Home Access: Level entry Bathroom Shower/Tub: Walk-in  shower;Curtain Bathroom Toilet: Handicapped height Bathroom Accessibility: Yes How Accessible: Accessible via walker;Accessible via wheelchair Home Adaptive Equipment: Grab bars in shower;Grab bars around toilet;Shower chair with back;Straight cane;Walker - rolling;Hand-held shower hose Prior Function Level of Independence: Needs assistance with ADLs;Needs assistance with homemaking;Needs assistance with tranfers (Nonambulatory) Able to Take Stairs?: No Driving: No Vocation: Retired Leisure: Hobbies-yes (Comment) Comments:  sew and play cards and dominoes  Cognition Cognition Orientation Level: Oriented X4 Sensation/Coordination Sensation Light Touch: Appears Intact Coordination Gross Motor Movements are Fluid and Coordinated: No Extremity Assessment RUE Strength RUE Overall Strength Comments: 4/5 LUE Strength LUE Overall Strength Comments: 4-/5 RLE Assessment RLE Assessment:  (Grossly 4-/5) LLE Assessment LLE Assessment:  (NT secondary to fx) Mobility (including Balance) Bed Mobility Bed Mobility: Yes Supine to Sit: 1: +2 Total assist;Patient percentage (comment) (pt 10% with HOB ~20degrees) Supine to Sit Details (indicate cue type and reason): pt requires max cueing and facilitation for supine to sit.   Sit to Supine: 1: +2 Total assist;Patient percentage (comment) (pt 10%) Sit to Supine - Details (indicate cue type and reason): continued max cueing and facilitation for safe technique.   Transfers Transfers: Yes Sit to Stand: 1: +2 Total assist;With upper extremity assist;From bed (pt 20%) Sit to Stand Details (indicate cue type and reason): pt needs max cueing and facilitation for safe technique, however is unable to maintain NWBing L LE.   Stand to Sit: 1: +2 Total assist;Patient percentage (comment);To bed (pt 30%) Ambulation/Gait Ambulation/Gait: No Stairs: No Wheelchair Mobility Wheelchair Mobility: No  Posture/Postural Control Posture/Postural Control: Postural  limitations Postural Limitations: pt with R lateral lean and trunk rotation in sitting.  pt notes this is not new and doesn't know what has caused it.   Balance Balance Assessed: Yes Static Sitting Balance Static Sitting - Balance Support: Bilateral upper extremity supported Static Sitting - Level of Assistance: 4: Min assist Static Sitting - Comment/# of Minutes: pt requires MinA to maintain sitting balance Exercise    End of Session PT - End of Session Equipment Utilized During Treatment: Gait belt Activity Tolerance: Patient limited by fatigue;Patient limited by pain Patient left: in bed;with call bell in reach Nurse Communication: Mobility status for transfers General Behavior During Session: [facility] for tasks performed Cognition: [facility] for tasks performed  Sunny Schlein, [place] [phone] 01/15/2012, 3:41 PM

## 2012-01-15 NOTE — Progress Notes (Signed)
Patient ID: [name] [name], female   DOB: August 16, 1941, 71 y.o.   MRN: [id number] Vascular Surgery Progress Note  Subjective: 2 days post left femoral nailing and exploration left femoral vessels for bleeding and evacuation hematoma Patient has received one unit of packed red blood cells over past 24 hours with hemodynamically stable vital sign 300 cc of JP drainage past 24 hours but only 10 cc past shift Objective:  Filed Vitals:   01/15/12 0712  BP:   Pulse:   Temp: 97.9 F (36.6 C)  Resp:     General well-developed well-nourished female no apparent distress alert and oriented x3 Left femoral dressing is dry removed and no evidence of expanding hematoma 2+ dorsalis pedis pulse palpable left foot-JP drain in place left inguinal wound   Labs:  Lab 01/15/12 0436 01/14/12 0730 01/13/12 0700  CREATININE 0.77 0.72 0.77    Lab 01/15/12 0436 01/14/12 0730 01/13/12 1034 01/13/12 0700  NA 136 136 140 --  K 3.9 4.0 4.2 --  CL 104 105 -- 105  CO2 26 24 -- 25  BUN 9 11 -- 12  CREATININE 0.77 0.72 -- 0.77  LABGLOM -- -- -- --  GLUCOSE 103* -- -- --  CALCIUM 8.3* 7.9* -- 8.7    Lab 01/15/12 0436 01/14/12 2153 01/14/12 0730  WBC 5.8 6.5 6.0  HGB 10.1* 10.0* 9.1*  HCT 30.9* 29.5* 27.4*  PLT 195 198 198    Lab 01/14/12 0730 01/13/12 1450  INR 1.37 1.33    I/O last 3 completed shifts: In: 3962.5 [P.O.:1540; I.V.:960; Blood:262.5; IV Piggyback:1200] Out: 4229 [Urine:3900; Drains:329]  Imaging: Dg Femur Left  01/13/2012  *RADIOLOGY REPORT*  Clinical Data: Intraoperative ORIF left femoral fracture  LEFT FEMUR - 2 VIEW  Comparison: 01/12/2012  Findings: Four intraoperative fluoroscopic images demonstrate a left femoral intramedullary rod fixation of the previously seen metadiaphyseal fracture.  There remains posterior angulation of the fracture fragments.  No evidence for hardware failure.  No new fracture. On presumed frontal projection, there is one half shaft width overlap of fracture  fragments.  IMPRESSION: Intraoperative fixation of left femoral fracture as above.  Original Report Authenticated By: Harrel Lemon, M.D.   Dg Femur Left Port  01/13/2012  *RADIOLOGY REPORT*  Clinical Data: Status post fracture fixation.  PORTABLE LEFT FEMUR - 2 VIEW  Comparison: Plain films 01/12/2012.  Findings: The patient has a new IM nail with three distal interlocking screws traversing a diaphyseal fracture through the junction of the middle and distal thirds of the left femur.  There is mild medial and posterior displacement.  Gas in the soft tissues and surgical staples noted.  IMPRESSION: Status post IM nailing of a distal femur fracture as described.  Original Report Authenticated By: Bernadene Bell. Maricela Curet, M.D.    Assessment/Plan:  POD #2  LOS: 3 days  s/p Procedure(s): INTRAMEDULLARY (IM) RETROGRADE FEMORAL NAILING FEMORAL ARTERY EXPLORATION  Appears that the oozing from the left femoral wound has almost stopped Hematocrit stable today at 30.9 with only one unit of blood transfused past 24 hour Okay to transfer patient to orthopedic floor We will leave the drain in place for another day or 2   Josephina Gip, MD 01/15/2012 9:04 AM

## 2012-01-15 NOTE — Progress Notes (Signed)
Occupational Therapy Evaluation Patient Details Name: [name] [name] MRN: [id number] DOB: 09/11/1941 Today's Date: 01/15/2012  Problem List:  Patient Active Problem List  Diagnoses  . HERPES ZOSTER W/NERVOUS COMPLICATION NEC  . CANDIDIASIS  . OBESITY  . DEPRESSION  . CAUSALGIA  . PERIPHERAL VASCULAR DISEASE  . URI  . PNEUMONIA  . BRONCHITIS NOS  . GASTROESOPHAGEAL REFLUX DISEASE  . RENAL FAILURE, ACUTE  . CYSTITIS  . PRESSURE ULCER HIP  . ULCER OF ANKLE  . LEG PAIN, LEFT  . OSTEOPENIA  . FATIGUE  . OTHER GENERAL SYMPTOMS  . SKIN RASH  . EDEMA LEG  . WEIGHT LOSS  . DYSURIA  . OTHER CLOSED FRACTURES OF UPPER END OF HUMERUS  . LIVER FUNCTION TESTS, ABNORMAL, HX OF  . CHOLECYSTECTOMY, HX OF  . POSTMENOPAUSAL STATUS  . MULTIPLE SCLEROSIS  . Femoral shaft fracture  . Blood loss anemia  . Infection due to urinary indwelling catheter    Past Medical History:  Past Medical History  Diagnosis Date  . Multiple sclerosis   . GERD (gastroesophageal reflux disease)   . Depression   . Recurrent UTI   . Recurrent pneumonia   . Chronic constipation   . Diverticulosis    Past Surgical History:  Past Surgical History  Procedure Date  . Cholecystectomy   . Orif humeral shaft fracture 01/2011  . Video assisted thoracoscopy 06/2010  . Closed reduction shoulder dislocation 04/2010    Fracture dislocation  . Femur im nail 01/13/2012    Procedure: INTRAMEDULLARY (IM) RETROGRADE FEMORAL NAILING;  Surgeon: Thera Flake., MD;  Location: [facility] OR;  Service: Orthopedics;  Laterality: Left;  . Femoral artery exploration 01/13/2012    Procedure: FEMORAL ARTERY EXPLORATION;  Surgeon: Pryor Ochoa, MD;  Location: [facility] OR;  Service: Vascular;  Laterality: Left;    OT Assessment/Plan/Recommendation OT Assessment Clinical Impression Statement: Patient is a 71 y.o. female with a PMHx of multiple sclerosis, who was admitted with Lt femur fx secondary to fall and is now s/p Lt femur IM nailing  followed by exploration of femoral vessels for bleeding and hematoma evacuation with drain placed. She has a recent history of humerus fracture in February of 2012 that required open reduction and fixation (side not specified). At baseline she has been using a wheelchair to get around and can only transfer on her own. She lives alone but has a home nurse that comes twice a day (6a-8a and 6p-10p) and helps with bathing, meal preparation etc. [facility] and [facility] her time there. Pt. presents with the below problem list and will benefit from skilled OT in the acute setting followed by ST-SNF to maximize I and allow for eventual d/c home and resumption of previous routine.  OT Recommendation/Assessment: Patient will need skilled OT in the acute care venue OT Problem List: Decreased strength;Decreased activity tolerance;Impaired balance (sitting and/or standing);Decreased safety awareness;Decreased knowledge of use of DME or AE;Decreased knowledge of precautions;Pain OT Therapy Diagnosis : Generalized weakness;Acute pain OT Plan OT Frequency: Min 1X/week OT Treatment/Interventions: Therapeutic exercise;DME and/or AE instruction;Self-care/ADL training;Therapeutic activities;Patient/family education;Balance training OT Recommendation Follow Up Recommendations: Skilled nursing facility Equipment Recommended: Defer to next venue Individuals Consulted Consulted and Agree with Results and Recommendations: Patient OT Goals Acute Rehab OT Goals OT Goal Formulation: With patient Time For Goal Achievement: 2 weeks ADL Goals Pt Will Perform Grooming: with set-up;Sitting, chair ADL Goal: Grooming - Progress: Goal set today Pt Will Perform Upper Body  Bathing: with set-up;with supervision;Sitting at sink ADL Goal: Upper Body Bathing - Progress: Goal set today Pt Will Perform Upper Body Dressing: with set-up;Sitting, bed;Unsupported ADL Goal: Upper Body Dressing - Progress: Goal set  today Pt Will Perform Lower Body Dressing: with mod assist;Sit to stand from bed ADL Goal: Lower Body Dressing - Progress: Goal set today Pt Will Transfer to Toilet: with mod assist;Stand pivot transfer;with DME;3-in-1 ADL Goal: Toilet Transfer - Progress: Goal set today Pt Will Perform Toileting - Clothing Manipulation: with min assist;Standing ADL Goal: Toileting - Clothing Manipulation - Progress: Goal set today Pt Will Perform Toileting - Hygiene: with min assist;Sit to stand from 3-in-1/toilet ADL Goal: Toileting - Hygiene - Progress: Goal set today Additional ADL Goal #1: Pt will be I with bilateral UE strengthening HEP in prep for ADL and ADL mobility. ADL Goal: Additional Goal #1 - Progress: Goal set today  OT Evaluation Precautions/Restrictions  Restrictions Weight Bearing Restrictions: Yes LLE Weight Bearing: Non weight bearing Prior Functioning Home Living Lives With: Alone Receives Help From: Personal care attendant (aide from [place]; adult daycare 8a-5p; aide again from 6p-10p) Type of Home: Apartment Home Layout: One level Home Access: Stairs to enter [employer] Shower/Tub: Walk-in shower;Curtain Bathroom Toilet: Handicapped height Bathroom Accessibility: Yes How Accessible: Accessible via walker;Accessible via wheelchair Home Adaptive Equipment: Grab bars in shower;Grab bars around toilet;Shower chair with back;Straight cane;Walker - rolling;Hand-held shower hose Prior Function Driving: No Vocation: Retired Leisure: Hobbies-yes (Comment) Comments:  sew and play cards and dominoes  ADL ADL Eating/Feeding: Simulated;Set up;Supervision/safety Where Assessed - Eating/Feeding: Bed level Grooming: Simulated;Minimal assistance Grooming Details (indicate cue type and reason): assist to maintain upright posture Where Assessed - Grooming: Sitting, bed Upper Body Bathing: Simulated;Moderate assistance Where Assessed - Upper Body Bathing: Sitting, bed Lower Body Bathing:  Simulated;Maximal assistance Where Assessed - Lower Body Bathing: Sit to stand from bed Upper Body Dressing: Simulated;Moderate assistance Where Assessed - Upper Body Dressing: Sitting, bed Lower Body Dressing: Simulated;+1 Total assistance Where Assessed - Lower Body Dressing: Sit to stand from bed Toilet Transfer: Not assessed Toileting - Clothing Manipulation: Not assessed Toileting - Hygiene: Not assessed Tub/Shower Transfer: Not assessed Ambulation Related to ADLs: Unable to perform ambulation secondary to patient unable to maintain LLE NWB status  Vision/Perception   Wears glasses for reading- reports recent (PTA) increase in blurry vision Sensation/Coordination Sensation Light Touch: Appears Intact Coordination Gross Motor Movements are Fluid and Coordinated: No Extremity Assessment RUE Strength RUE Overall Strength Comments: 4/5 LUE Strength LUE Overall Strength Comments: 4-/5: LUE is noticeably weaker than RUE Mobility  Bed Mobility Supine to Sit: 1: +2 Total assist;HOB elevated (Comment degrees) (~20degrees) Supine to Sit Details (indicate cue type and reason): pt=10% Sit to Supine: 1: +2 Total assist;HOB flat (pt=10%) Transfers Sit to Stand: 1: +2 Total assist;With upper extremity assist;From elevated surface (pt=20% and unable to maintain LLE NWB status) Stand to Sit: 1: +2 Total assist;To bed;To elevated surface;With upper extremity assist (pt=30%) End of Session OT - End of Session Equipment Utilized During Treatment: Gait belt Activity Tolerance: Patient limited by pain Patient left: in bed;with call bell in reach;with bed alarm set Nurse Communication: Mobility status for transfers General Behavior During Session: [facility] for tasks performed Cognition: [facility] for tasks performed   Hanny Elsberry 01/15/2012, 3:01 PM

## 2012-01-15 NOTE — Progress Notes (Signed)
Subjective: Patient is doing well, has no other complaints or concerns. Wants to be started on vegetarian diet. Patient's last BM was Saturday. Denies any nausea or abdominal pain.   Objective: Vital signs in last 24 hours: Filed Vitals:   01/15/12 1300 01/15/12 1400 01/15/12 1500 01/15/12 1540  BP: 105/54 110/70 94/42   Pulse: 98 107 105   Temp:    98.6 F (37 C)  TempSrc:    Oral  Resp: 19 18 16    Height:      Weight:      SpO2: 96% 95% 98%    Weight change: 14.1 oz (0.4 kg)  Intake/Output Summary (Last 24 hours) at 01/15/12 1645 Last data filed at 01/15/12 1400  Gross per 24 hour  Intake 2852.5 ml  Output   2816 ml  Net   36.5 ml   Physical Exam: General: elderly woman resting in bed, talking with her daughter and nurse HEENT: PERRL, EOMI, no scleral icterus Cardiac: RRR, no rubs, murmurs or gallops Pulm: clear to auscultation bilaterally, moving normal volumes of air Abd: soft, nontender, BS present Ext: warm and well perfused, no pedal edema Neuro: alert and oriented X3, cranial nerves II-XII grossly intact  Lab Results: Basic Metabolic Panel:  Lab 01/15/12 1478 01/14/12 0730  NA 136 136  K 3.9 4.0  CL 104 105  CO2 26 24  GLUCOSE 103* 99  BUN 9 11  CREATININE 0.77 0.72  CALCIUM 8.3* 7.9*  MG -- --  PHOS -- --   CBC:  Lab 01/15/12 0436 01/14/12 2153 01/13/12 1450 01/12/12 1316  WBC 5.8 6.5 -- --  NEUTROABS -- -- 5.7 3.1  HGB 10.1* 10.0* -- --  HCT 30.9* 29.5* -- --  MCV 80.1 78.5 -- --  PLT 195 198 -- --   Coagulation:  Lab 01/14/12 0730 01/13/12 1450  LABPROT 17.1* 16.7*  INR 1.37 1.33   Micro Results: Recent Results (from the past 240 hour(s))  URINE CULTURE     Status: Normal   Collection Time   01/12/12  2:35 PM      Component Value Range Status Comment   Specimen Description URINE, CLEAN CATCH   Final    Special Requests NONE   Final    Setup Time [id number]   Final    Colony Count >=100,000 COLONIES/ML   Final    Culture     Final     Value: Multiple bacterial morphotypes present, none predominant. Suggest appropriate recollection if clinically indicated.   Report Status 01/13/2012 FINAL   Final   SURGICAL PCR SCREEN     Status: Normal   Collection Time   01/13/12  1:24 AM      Component Value Range Status Comment   MRSA, PCR NEGATIVE  NEGATIVE  Final    Staphylococcus aureus NEGATIVE  NEGATIVE  Final   MRSA PCR SCREENING     Status: Normal   Collection Time   01/13/12  1:31 PM      Component Value Range Status Comment   MRSA by PCR NEGATIVE  NEGATIVE  Final    Studies/Results: Dg Femur Left Port  01/13/2012  *RADIOLOGY REPORT*  Clinical Data: Status post fracture fixation.  PORTABLE LEFT FEMUR - 2 VIEW  Comparison: Plain films 01/12/2012.  Findings: The patient has a new IM nail with three distal interlocking screws traversing a diaphyseal fracture through the junction of the middle and distal thirds of the left femur.  There is mild medial and posterior displacement.  Gas in the soft tissues and surgical staples noted.  IMPRESSION: Status post IM nailing of a distal femur fracture as described.  Original Report Authenticated By: Bernadene Bell. Maricela Curet, M.D.   Medications: I have reviewed the patient's current medications. Scheduled Meds:    . amitriptyline  100 mg Oral QHS  . antiseptic oral rinse  15 mL Mouth Rinse BID  . ciprofloxacin  400 mg Intravenous Q12H  . dalfampridine  10 mg Oral BID  . docusate sodium  200 mg Oral BID  . FLUoxetine  40 mg Oral Daily  . gabapentin  300 mg Oral QHS  . gabapentin  300 mg Oral Q supper  . gabapentin  600 mg Oral Q lunch  . multivitamins ther. w/minerals  1 tablet Oral Daily  . pantoprazole  40 mg Oral BID AC  . vitamin C  500 mg Oral BID   Continuous Infusions:    . sodium chloride 50 mL/hr at 01/15/12 1455   PRN Meds:.acetaminophen, acetaminophen, bisacodyl, HYDROmorphone, metoCLOPramide (REGLAN) injection, ondansetron (ZOFRAN) IV, ondansetron, oxyCODONE,  senna-docusate Assessment/Plan: A/P:  Femoral Shaft Fracture:  Patient is s/p retrograde nailing yesterday -cefazolin per orthopedics  Blood Loss anemia:  -Hgb stable at 10.1, very close to her baseline -will continue to monitor and move to orthopedic surg floor tomorrow  UTI: treating with ciprofloxacin for total of 7-10 days for complicated UTI  MS: continue home meds   Chronic constipation: patient has only been getting colace, not taking any PRNs.  -ordered miralax daily    LOS: 3 days   Margorie John 01/15/2012, 4:45 PM

## 2012-01-15 NOTE — Progress Notes (Signed)
01-15-12 UR completed. Ronny Flurry RN BSN

## 2012-01-15 NOTE — Progress Notes (Signed)
Subjective: 2 Days Post-Op Procedure(s) (LRB): INTRAMEDULLARY (IM) RETROGRADE FEMORAL NAILING (Left) FEMORAL ARTERY EXPLORATION (Left) Patient reports pain as mild.    Objective: Vital signs in last 24 hours: Temp:  [97.7 F (36.5 C)-100 F (37.8 C)] 98.6 F (37 C) (01/22 1540) Pulse Rate:  [94-109] 105  (01/22 1600) Resp:  [16-25] 22  (01/22 1600) BP: (73-115)/(35-80) 102/49 mmHg (01/22 1600) SpO2:  [91 %-98 %] 95 % (01/22 1600) Weight:  [93.4 kg (205 lb 14.6 oz)] 93.4 kg (205 lb 14.6 oz) (01/22 0500)  Intake/Output from previous day: 01/21 0701 - 01/22 0700 In: 2842.5 [P.O.:1240; I.V.:890; Blood:262.5; IV Piggyback:450] Out: 3454 [Urine:3375; Drains:79] Intake/Output this shift: Total I/O In: 1250 [P.O.:800; I.V.:450] Out: 737 [Urine:725; Drains:12]   Basename 01/15/12 0436 01/14/12 2153 01/14/12 0730 01/13/12 2349 01/13/12 1700  HGB 10.1* 10.0* 9.1* 10.8* 10.9*    Basename 01/15/12 0436 01/14/12 2153  WBC 5.8 6.5  RBC 3.86* 3.76*  HCT 30.9* 29.5*  PLT 195 198    Basename 01/15/12 0436 01/14/12 0730  NA 136 136  K 3.9 4.0  CL 104 105  CO2 26 24  BUN 9 11  CREATININE 0.77 0.72  GLUCOSE 103* 99  CALCIUM 8.3* 7.9*    Basename 01/14/12 0730 01/13/12 1450  LABPT -- --  INR 1.37 1.33    Dorsiflexion/Plantar flexion intact  Assessment/Plan: 2 Days Post-Op Procedure(s) (LRB): INTRAMEDULLARY (IM) RETROGRADE FEMORAL NAILING (Left) FEMORAL ARTERY EXPLORATION (Left) Up with therapy  Marlisha Vanwyk JR,W D 01/15/2012, 5:25 PM

## 2012-01-15 NOTE — Plan of Care (Signed)
Problem: Phase I Progression Outcomes Goal: [place] evening of surgery Outcome: Progressing Patient worked with PT this am. SBP have continued to run low 90's and 100's. PT felt it would be best not to get up in a chair today.

## 2012-01-16 LAB — CBC
MCH: 26.1 pg (ref 26.0–34.0)
MCHC: 32.9 g/dL (ref 30.0–36.0)
Platelets: 225 10*3/uL (ref 150–400)
RDW: 20.4 % — ABNORMAL HIGH (ref 11.5–15.5)

## 2012-01-16 MED ORDER — CIPROFLOXACIN HCL 500 MG PO TABS
500.0000 mg | ORAL_TABLET | Freq: Two times a day (BID) | ORAL | Status: DC
  Administered 2012-01-16 – 2012-01-21 (×10): 500 mg via ORAL
  Filled 2012-01-16 (×13): qty 1

## 2012-01-16 MED ORDER — CIPROFLOXACIN HCL 500 MG PO TABS
500.0000 mg | ORAL_TABLET | Freq: Two times a day (BID) | ORAL | Status: DC
  Filled 2012-01-16 (×3): qty 1

## 2012-01-16 NOTE — Plan of Care (Signed)
Problem: Phase I Progression Outcomes Goal: Post op pain controlled with appropriate interventions Outcome: Completed/Met Date Met:  01/16/12 Pain controlled with PO Oxycodone IR.

## 2012-01-16 NOTE — Progress Notes (Signed)
Clinical Social Work-CSW received referral for pt for possible ST Rehab at d/c. [facility] a [facility] and CSW left a message for [facility] SW to coordinate/facilitate disposition-full assessment to follow. Jodean Lima, [phone]

## 2012-01-16 NOTE — Progress Notes (Signed)
Subjective: Patient is doing well, has no other complaints or concerns. Patient did not receive solid meal yet, only juices. Her diet is ordered as vegetarian and advance as tolerated. Patient's last BM was Saturday.  Objective: Vital signs in last 24 hours: Filed Vitals:   01/16/12 0600 01/16/12 0700 01/16/12 0714 01/16/12 0800  BP: 92/36 93/50  92/48  Pulse: 88 91  89  Temp:   98.3 F (36.8 C)   TempSrc:   Oral   Resp: 21 21  18   Height:      Weight:      SpO2: 96% 97%  98%   Weight change: -3.5 oz (-0.1 kg)  Intake/Output Summary (Last 24 hours) at 01/16/12 0854 Last data filed at 01/16/12 0800  Gross per 24 hour  Intake   2790 ml  Output   2492 ml  Net    298 ml   Physical Exam: General: elderly woman resting in bed, talking with her daughter and nurse HEENT: PERRL, EOMI, no scleral icterus Cardiac: RRR, no rubs, murmurs or gallops Pulm: clear to auscultation bilaterally, moving normal volumes of air Abd: soft, nontender, BS present Ext: warm and well perfused, no pedal edema Neuro: alert and oriented X3, cranial nerves II-XII grossly intact  Lab Results: Basic Metabolic Panel:  Lab 01/15/12 1610 01/14/12 0730  NA 136 136  K 3.9 4.0  CL 104 105  CO2 26 24  GLUCOSE 103* 99  BUN 9 11  CREATININE 0.77 0.72  CALCIUM 8.3* 7.9*  MG -- --  PHOS -- --   CBC:  Lab 01/16/12 0415 01/15/12 0436 01/13/12 1450 01/12/12 1316  WBC 5.5 5.8 -- --  NEUTROABS -- -- 5.7 3.1  HGB 9.8* 10.1* -- --  HCT 29.8* 30.9* -- --  MCV 79.3 80.1 -- --  PLT 225 195 -- --   Coagulation:  Lab 01/14/12 0730 01/13/12 1450  LABPROT 17.1* 16.7*  INR 1.37 1.33   Micro Results: Recent Results (from the past 240 hour(s))  URINE CULTURE     Status: Normal   Collection Time   01/12/12  2:35 PM      Component Value Range Status Comment   Specimen Description URINE, CLEAN CATCH   Final    Special Requests NONE   Final    Setup Time [id number]   Final    Colony Count >=100,000  COLONIES/ML   Final    Culture     Final    Value: Multiple bacterial morphotypes present, none predominant. Suggest appropriate recollection if clinically indicated.   Report Status 01/13/2012 FINAL   Final   SURGICAL PCR SCREEN     Status: Normal   Collection Time   01/13/12  1:24 AM      Component Value Range Status Comment   MRSA, PCR NEGATIVE  NEGATIVE  Final    Staphylococcus aureus NEGATIVE  NEGATIVE  Final   MRSA PCR SCREENING     Status: Normal   Collection Time   01/13/12  1:31 PM      Component Value Range Status Comment   MRSA by PCR NEGATIVE  NEGATIVE  Final    Studies/Results: No results found. Medications: I have reviewed the patient's current medications. Scheduled Meds:    . amitriptyline  100 mg Oral QHS  . antiseptic oral rinse  15 mL Mouth Rinse BID  . ciprofloxacin  500 mg Oral BID  . dalfampridine  10 mg Oral BID  . docusate sodium  200 mg Oral BID  .  FLUoxetine  40 mg Oral Daily  . gabapentin  300 mg Oral QHS  . gabapentin  300 mg Oral Q supper  . gabapentin  600 mg Oral Q lunch  . multivitamins ther. w/minerals  1 tablet Oral Daily  . pantoprazole  40 mg Oral BID AC  . polyethylene glycol  17 g Oral Daily  . vitamin C  500 mg Oral BID  . DISCONTD: ciprofloxacin  400 mg Intravenous Q12H  . DISCONTD: ciprofloxacin  500 mg Oral BID   Continuous Infusions:    . sodium chloride 50 mL/hr at 01/16/12 0800   PRN Meds:.acetaminophen, acetaminophen, bisacodyl, HYDROmorphone, metoCLOPramide (REGLAN) injection, ondansetron (ZOFRAN) IV, ondansetron, oxyCODONE, senna-docusate Assessment/Plan: A/P:  Femoral Shaft Fracture:  Patient is s/p retrograde nailing yesterday  Blood Loss anemia:  -Hgb is 9.8, stable from 10.1 yesterday AM -will transfer to orthopedic surg floor today UTI: treating with ciprofloxacin for total of 7-10 days for complicated UTI -JP has put out very little, surgical team is removing today  MS: continue home meds   Chronic  constipation: patient has only been getting colace, not taking any PRNs.  -ordered miralax daily    LOS: 4 days   Margorie John 01/16/2012, 8:54 AM

## 2012-01-16 NOTE — Progress Notes (Signed)
Subjective: 3 Days Post-Op Procedure(s) (LRB): INTRAMEDULLARY (IM) RETROGRADE FEMORAL NAILING (Left) FEMORAL ARTERY EXPLORATION (Left) Patient reports pain as mild.  10cc drainage from JP in the last 24hrs. Unable to get up to chair with PT yesterday low BP.  Objective: Vital signs in last 24 hours: Temp:  [97.5 F (36.4 C)-101.5 F (38.6 C)] 98.3 F (36.8 C) (01/23 0714) Pulse Rate:  [88-117] 91  (01/23 0700) Resp:  [16-25] 21  (01/23 0700) BP: (73-110)/(23-70) 93/50 mmHg (01/23 0700) SpO2:  [91 %-98 %] 97 % (01/23 0700) Weight:  [93.3 kg (205 lb 11 oz)] 93.3 kg (205 lb 11 oz) (01/23 0500)  Intake/Output from previous day: 01/22 0701 - 01/23 0700 In: 2790 [P.O.:1240; I.V.:1150; IV Piggyback:400] Out: 2492 [Urine:2475; Drains:17] Intake/Output this shift:     Basename 01/16/12 0415 01/15/12 0436 01/14/12 2153 01/14/12 0730 01/13/12 2349  HGB 9.8* 10.1* 10.0* 9.1* 10.8*    Basename 01/16/12 0415 01/15/12 0436  WBC 5.5 5.8  RBC 3.76* 3.86*  HCT 29.8* 30.9*  PLT 225 195    Basename 01/15/12 0436 01/14/12 0730  NA 136 136  K 3.9 4.0  CL 104 105  CO2 26 24  BUN 9 11  CREATININE 0.77 0.72  GLUCOSE 103* 99  CALCIUM 8.3* 7.9*    Basename 01/14/12 0730 01/13/12 1450  LABPT -- --  INR 1.37 1.33    Neurovascular intact Sensation intact distally Intact pulses distally Dorsiflexion/Plantar flexion intact Incision: dressing C/D/I and scant drainage Obtained doppler pulses  Mild erythema and warmth surrounding proximal incision no active drainage, knee incisions C/D/I minimal post op drainage on dressing, no active draining.  Left foot in derotation boot. JP drain site no erythema or drainage. Drain pulled without complication, pt tolerated well, no active drainage.  Assessment/Plan: 3 Days Post-Op Procedure(s) (LRB): INTRAMEDULLARY (IM) RETROGRADE FEMORAL NAILING (Left) FEMORAL ARTERY EXPLORATION (Left) Up with therapy Daily dressing changes and prn, monitor  erythema around proximal incision. Up with PT bed to chair transfers only, NWB LLE. Transfer out of SICU to orthopaedic unit. Appreciate medical management of this pt.  Vascular- In lieu of bleeding any recommendations for anicoagulation for DVT proph?  [name] 01/16/2012, 8:40 AM

## 2012-01-16 NOTE — Progress Notes (Signed)
Patient ID: [name] [name], female   DOB: 12/15/1941, 71 y.o.   MRN: [id number] Vascular Surgery Progress Note  Subjective: Doing well. No further bleeding. JP drain removed post minimal drainage past 24 hrs  Objective:  Filed Vitals:   01/16/12 0800  BP: 92/48  Pulse: 89  Temp:   Resp: 18    L groin wound looks good.   2+ DP pulse palpable   Labs:  Lab 01/15/12 0436 01/14/12 0730 01/13/12 0700  CREATININE 0.77 0.72 0.77    Lab 01/15/12 0436 01/14/12 0730 01/13/12 1034 01/13/12 0700  NA 136 136 140 --  K 3.9 4.0 4.2 --  CL 104 105 -- 105  CO2 26 24 -- 25  BUN 9 11 -- 12  CREATININE 0.77 0.72 -- 0.77  LABGLOM -- -- -- --  GLUCOSE 103* -- -- --  CALCIUM 8.3* 7.9* -- 8.7    Lab 01/16/12 0415 01/15/12 0436 01/14/12 2153  WBC 5.5 5.8 6.5  HGB 9.8* 10.1* 10.0*  HCT 29.8* 30.9* 29.5*  PLT 225 195 198    Lab 01/14/12 0730 01/13/12 1450  INR 1.37 1.33    I/O last 3 completed shifts: In: 4005 [P.O.:1480; I.V.:1800; Blood:125; IV Piggyback:600] Out: 4536 [Urine:4500; Drains:36]  Imaging: No results found.  Assessment/Plan:  POD #3  LOS: 4 days  s/p Procedure(s): INTRAMEDULLARY (IM) RETROGRADE FEMORAL NAILING FEMORAL ARTERY EXPLORATION L leg  Doing well tx today Will see again at your request.   Josephina Gip, MD 01/16/2012 9:19 AM

## 2012-01-16 NOTE — Plan of Care (Signed)
Problem: Phase III Progression Outcomes Goal: Incision clean - minimal/no drainage Outcome: Completed/Met Date Met:  01/16/12 Dressing changed by orthopaedic PA-C this AM.

## 2012-01-16 NOTE — Progress Notes (Signed)
.  Internal Medicine Teaching Service Attending Note Date: 01/16/2012  Patient name: [name] [name]  Medical record number: [id number]  Date of birth: 06-Jun-1941    This patient has been seen and discussed with the house staff. Please see their note for complete details. I concur with their findings with the following additions/corrections:Will need few more days in house and need input from orthopedics.  Lina Sayre 01/16/2012, 5:10 PM

## 2012-01-17 LAB — CBC
Platelets: 278 10*3/uL (ref 150–400)
RBC: 3.58 MIL/uL — ABNORMAL LOW (ref 3.87–5.11)
RDW: 20.5 % — ABNORMAL HIGH (ref 11.5–15.5)
WBC: 6.1 10*3/uL (ref 4.0–10.5)

## 2012-01-17 LAB — BASIC METABOLIC PANEL
Calcium: 8.1 mg/dL — ABNORMAL LOW (ref 8.4–10.5)
GFR calc non Af Amer: 88 mL/min — ABNORMAL LOW (ref >90)
Sodium: 136 mEq/L (ref 135–145)

## 2012-01-17 NOTE — Clinical Documentation Improvement (Signed)
SHOCK DOCUMENTATION CLARIFICATION QUERY  THIS DOCUMENT IS NOT A PERMANENT PART OF THE MEDICAL RECORD  TO RESPOND TO THE THIS QUERY, FOLLOW THE INSTRUCTIONS BELOW:  1. If needed, update documentation for the patient's encounter via the notes activity.  2. Access this query again and click edit on the In [employer].  3. After updating, or not, click F2 to complete all highlighted (required) fields concerning your review. Select "additional documentation in the medical record" OR "no additional documentation provided".  4. Click Sign note button.  5. The deficiency will fall out of your In Basket *Please let us know if you are not able to complete this workflow by phone or e-mail (listed below).  Please update your documentation within the medical record to reflect your response to this query.                                                                                        01/17/12   Dear Dr. Karie Schwalbe. [facility]  In a better effort to capture your patient's severity of illness, reflect appropriate length of stay and utilization of resources, a review of the patient medical record has revealed the following indicators.    Based on your clinical judgment, please clarify and document in a progress note and/or discharge summary the clinical condition associated with the following supporting information:  In responding to this query please exercise your independent judgment.  The fact that a query is asked, does not imply that any particular answer is desired or expected.   Noted EBL 1500 during OR procedure with hematoma occuring postop with hypotension...patient transfused With 4 - 6 unit of PRBC and 2 units of FFP during or procedure .  H/H 10.1/34.0 ( 1/19) to 6.1/ 18.0 (1/20).   VS prior to procedure 1/19 97.3/ 84/ 20/ 122/52  01/13/12  p 55 r 22 bp 81/45 /// p60, r 22 bp 92/45 // p 75, r 21, 101/54.  Patient returned to OR for evacuation of Hematoma. Please clarify  appropriate secondary diagnosis to explain events noted.  Thank you   Possible Clinical Conditions?  "           Hypovolemic Shock resolved  " Hemorrhagic Shock resolved  " Other Condition_______________  " Cannot Clinically determine   Signs & Symptoms:  Pt arrived to 3300 @1320  and was hypotensive (see vital signs) At 1330 L groin dressing was saturated and actively bleeding with small hematoma -- began holding pressure. Dr. Maisie Fus paged at 1335 -- received order for 500 ml bolus (completed at 1425). Hematoma marked around groin @1345 .  -- reported above information to each of them and that hematoma was expanding to mid-thigh. Plan of care per Dr. Madelon Lips -- pt will go back to OR and he will call Dr. Hart [place]. Transported pt to OR with Scientist, clinical (histocompatibility and immunogenetics) (RRT) @ 1430   VS: 1/19 97.3/ 84/ 20/ 122/52  01/13/12  p 55 r 22 bp 81/45 /// p60, r 22 bp 92/45 // p 75, r 21, 101/54.      Treatment: NS @ 221ml/ during OR then 248ml/hr, in OR 500 ml /IV  Hestend ...return to surgery to evacuate hematoma  You may use possible, probable, or suspect with inpatient documentation. possible, probable, suspected diagnoses MUST be documented at the time of discharge  Reviewed: additional documentation in the medical record  Thank You,  Lavonda Jumbo  Clinical Documentation Specialist RN, BSN. CDS:  Pager  [phone] Health Information Management [place]

## 2012-01-17 NOTE — Progress Notes (Signed)
Subjective: Patient is doing well, has no other complaints or concerns. Patient did not order solid meal yet, only juices and jello. Her diet is ordered as vegetarian and advance as tolerated. Encouraged her to advance her diet. Patient had BM yesterday. Has no complaints.  Objective: Vital signs in last 24 hours: Filed Vitals:   01/16/12 1800 01/16/12 2000 01/17/12 0654 01/17/12 1300  BP: 88/40 118/73 110/72 117/69  Pulse: 109 112 88 96  Temp:  98.9 F (37.2 C) 97.8 F (36.6 C) 98.8 F (37.1 C)  TempSrc:  Oral    Resp: 22 19 20 18   Height:      Weight:      SpO2: 93% 96% 91% 100%   Weight change:   Intake/Output Summary (Last 24 hours) at 01/17/12 1545 Last data filed at 01/17/12 1200  Gross per 24 hour  Intake    920 ml  Output   1600 ml  Net   -680 ml   Physical Exam: General: elderly woman resting in bed comfortably, talking with friend from church HEENT: PERRL, EOMI, no scleral icterus Cardiac: RRR, no rubs, murmurs or gallops Pulm: clear to auscultation bilaterally, moving normal volumes of air Abd: soft, nontender, BS present Ext: warm and well perfused, no pedal edema, dressings dry with brace in place Neuro: alert and oriented X3, cranial nerves II-XII grossly intact  Lab Results: Basic Metabolic Panel:  Lab 01/17/12 1610 01/15/12 0436  NA 136 136  K 3.5 3.9  CL 105 104  CO2 24 26  GLUCOSE 97 103*  BUN 12 9  CREATININE 0.64 0.77  CALCIUM 8.1* 8.3*  MG -- --  PHOS -- --   CBC:  Lab 01/17/12 0450 01/16/12 0415 01/13/12 1450 01/12/12 1316  WBC 6.1 5.5 -- --  NEUTROABS -- -- 5.7 3.1  HGB 9.3* 9.8* -- --  HCT 28.6* 29.8* -- --  MCV 79.9 79.3 -- --  PLT 278 225 -- --   Coagulation:  Lab 01/14/12 0730 01/13/12 1450  LABPROT 17.1* 16.7*  INR 1.37 1.33   Micro Results: Recent Results (from the past 240 hour(s))  URINE CULTURE     Status: Normal   Collection Time   01/12/12  2:35 PM      Component Value Range Status Comment   Specimen  Description URINE, CLEAN CATCH   Final    Special Requests NONE   Final    Setup Time [id number]   Final    Colony Count >=100,000 COLONIES/ML   Final    Culture     Final    Value: Multiple bacterial morphotypes present, none predominant. Suggest appropriate recollection if clinically indicated.   Report Status 01/13/2012 FINAL   Final   SURGICAL PCR SCREEN     Status: Normal   Collection Time   01/13/12  1:24 AM      Component Value Range Status Comment   MRSA, PCR NEGATIVE  NEGATIVE  Final    Staphylococcus aureus NEGATIVE  NEGATIVE  Final   MRSA PCR SCREENING     Status: Normal   Collection Time   01/13/12  1:31 PM      Component Value Range Status Comment   MRSA by PCR NEGATIVE  NEGATIVE  Final    Studies/Results: No results found. Medications: I have reviewed the patient's current medications. Scheduled Meds:    . amitriptyline  100 mg Oral QHS  . antiseptic oral rinse  15 mL Mouth Rinse BID  . ciprofloxacin  500 mg  Oral BID  . dalfampridine  10 mg Oral BID  . docusate sodium  200 mg Oral BID  . FLUoxetine  40 mg Oral Daily  . gabapentin  300 mg Oral QHS  . gabapentin  300 mg Oral Q supper  . gabapentin  600 mg Oral Q lunch  . multivitamins ther. w/minerals  1 tablet Oral Daily  . pantoprazole  40 mg Oral BID AC  . polyethylene glycol  17 g Oral Daily  . vitamin C  500 mg Oral BID   Continuous Infusions:    . sodium chloride 50 mL/hr at 01/17/12 0839   PRN Meds:.acetaminophen, acetaminophen, bisacodyl, HYDROmorphone, metoCLOPramide (REGLAN) injection, ondansetron (ZOFRAN) IV, ondansetron, oxyCODONE, senna-docusate Assessment/Plan: A/P:  Femoral Shaft Fracture:  Patient is s/p retrograde nailing   Blood Loss anemia:  -Hgb is 9.3, slightly reduced from 9.8 yesterday -will consider discharging to [facility] tomorrow, patient okay to go from medical perspective and SNF bed available -JP has put out very little, surgical team is removing today  UTI: treating  with ciprofloxacin for total of 7-10 days for complicated UTI  MS: continue home meds   Chronic constipation: patient has only been getting colace, not taking any PRNs.  -ordered miralax daily    LOS: 5 days   Margorie John 01/17/2012, 3:45 PM

## 2012-01-17 NOTE — Progress Notes (Signed)
Clinical Social Work-CSW received phone call from PACE SW who relayed that she has been facilitating d/c to [place] on pt and [facility] end. CSW will initiate FL2 and search with full assessment. Jodean Lima, [phone]

## 2012-01-17 NOTE — Progress Notes (Signed)
Physical Therapy Treatment Patient Details Name: [name] [name] MRN: [id number] DOB: 11-27-1941 Today's Date: 01/17/2012  PT Assessment/Plan  PT - Assessment/Plan PT Plan: Discharge plan remains appropriate PT Frequency: Min 3X/week Follow Up Recommendations: Skilled nursing facility PT Goals  Acute Rehab PT Goals PT Goal: Supine/Side to Sit - Progress: Progressing toward goal PT Goal: Sit to Supine/Side - Progress: Progressing toward goal PT Transfer Goal: Bed to Chair/Chair to Bed - Progress: Progressing toward goal  PT Treatment Precautions/Restrictions  Precautions Precautions: Fall Restrictions Weight Bearing Restrictions: Yes LLE Weight Bearing: Non weight bearing Mobility (including Balance) Bed Mobility Supine to Sit: 1: +2 Total assist Supine to Sit Details (indicate cue type and reason): pt= 20%, limited L UE function, inablilty to move L LE on own Transfers Lateral/Scoot Transfers: 1: +2 Total assist (with slide board) Lateral/Scoot Transfer Details (indicate cue type and reason): patient unable to assist with transfer, patient with minimal UE strength this date. patient transfered into drop arm wheel chair Ambulation/Gait Ambulation/Gait: No  Posture/Postural Control Postural Limitations: pt unable to achieve full upright posture in sitting, R lateral side bending and increased trunk flexion Static Sitting Balance Static Sitting - Balance Support: Bilateral upper extremity supported Static Sitting - Level of Assistance: 4: Min assist Static Sitting - Comment/# of Minutes: 15 Exercise  General Exercises - Lower Extremity Ankle Circles/Pumps: AROM;Both;20 reps;Supine (limited L LE movement) Long Arc Quad: Both;20 reps;Seated (assist for L LE LAQ) End of Session PT - End of Session Equipment Utilized During Treatment: Gait belt (PRAFO removed for transfer) Activity Tolerance: Patient tolerated treatment well Patient left: with call bell in reach (left in wheel  chair, RN aware, L LE elevated on footrest) Nurse Communication: Mobility status for transfers General Behavior During Session: [facility] for tasks performed Cognition: [facility] for tasks performed  Marcene Brawn 01/17/2012, 12:59 PM  Lewis Shock, PT, DPT Pager #: [phone] Office #: [phone]

## 2012-01-17 NOTE — Progress Notes (Signed)
Physical Therapy Treatment Patient Details Name: [name] [name] MRN: [id number] DOB: Dec 02, 1941 Today's Date: 01/17/2012  PT returned s/p 2 hours to return patient back to bed. L PRAFO donned once patient back in bed PT Assessment/Plan  PT - Assessment/Plan PT Plan: Discharge plan remains appropriate PT Frequency: Min 3X/week Follow Up Recommendations: Skilled nursing facility PT Goals  Acute Rehab PT Goals PT Goal: Supine/Side to Sit - Progress: Progressing toward goal PT Goal: Sit to Supine/Side - Progress: Progressing toward goal PT Transfer Goal: Bed to Chair/Chair to Bed - Progress: Progressing toward goal Additional Goals PT Goal: Additional Goal #1 - Progress: Progressing toward goal  PT Treatment Precautions/Restrictions  Precautions Precautions: Fall Restrictions Weight Bearing Restrictions: Yes LLE Weight Bearing: Non weight bearing Mobility (including Balance) Bed Mobility Sit to Supine: 1: +2 Total assist (pt=10%) Transfers Lateral/Scoot Transfers: 1: +2 Total assist Lateral/Scoot Transfer Details (indicate cue type and reason): patient unable to assist with transfer, patient with minimal UE strength this date. patient transferred back into bed Ambulation/Gait Ambulation/Gait: No   End of Session PT - End of Session Equipment Utilized During Treatment: Gait belt  Activity Tolerance:  (Patient tolerated sitting up in chair x 2 hours) Patient left: in bed;with call bell in reach Nurse Communication: Mobility status for transfers General Behavior During Session: [facility] for tasks performed Cognition: [facility] for tasks performed  Marcene Brawn 01/17/2012, 1:02 PM  Lewis Shock, PT, DPT Pager #: [phone] Office #: [phone]

## 2012-01-18 DIAGNOSIS — M79609 Pain in unspecified limb: Secondary | ICD-10-CM

## 2012-01-18 LAB — POCT I-STAT 4, (NA,K, GLUC, HGB,HCT)
Glucose, Bld: 104 mg/dL — ABNORMAL HIGH (ref 70–99)
Hemoglobin: 9.2 g/dL — ABNORMAL LOW (ref 12.0–15.0)
Potassium: 4.7 mEq/L (ref 3.5–5.1)
Sodium: 143 mEq/L (ref 135–145)

## 2012-01-18 LAB — CBC
HCT: 29.1 % — ABNORMAL LOW (ref 36.0–46.0)
Hemoglobin: 9.3 g/dL — ABNORMAL LOW (ref 12.0–15.0)
RDW: 20.4 % — ABNORMAL HIGH (ref 11.5–15.5)
WBC: 5.4 10*3/uL (ref 4.0–10.5)

## 2012-01-18 MED ORDER — ASPIRIN 325 MG PO TABS
325.0000 mg | ORAL_TABLET | Freq: Two times a day (BID) | ORAL | Status: DC
  Administered 2012-01-18 – 2012-01-21 (×7): 325 mg via ORAL
  Filled 2012-01-18 (×9): qty 1

## 2012-01-18 NOTE — Progress Notes (Signed)
VASCULAR LAB PRELIMINARY  PRELIMINARY  PRELIMINARY  PRELIMINARY  Left lower extremity venous duplex completed.    Preliminary report:  Left:  No obvious evidence of DVT, superficial thrombosis, or Baker's cyst.  Technically limited by edema.  Terance Hart, RVT 01/18/2012, 4:01 PM

## 2012-01-18 NOTE — Progress Notes (Signed)
Clinical Social Work-CSW intern completed full assessment. CSW facilitated FL2 and arranged d/c to [facility] when pt medically stable. CSW notified [facility] SW and intern notified family. CSW will facilitate pt d/c and transport  When pt cleared. Jodean Lima, [phone]

## 2012-01-18 NOTE — Progress Notes (Signed)
Subjective: 5 Days Post-Op Procedure(s) (LRB): INTRAMEDULLARY (IM) RETROGRADE FEMORAL NAILING (Left) FEMORAL ARTERY EXPLORATION (Left) Patient reports pain as mild.  She did work with PT yesterday with slide board transfers, unable to stand and transfer with LLE NWB.  States she is feeling fine.  Does have left foot drop, pt unable to define if she had any degree of this prior to injury.  Objective: Vital signs in last 24 hours: Temp:  [98 F (36.7 C)-98.8 F (37.1 C)] 98.6 F (37 C) (01/25 0508) Pulse Rate:  [96-105] 98  (01/25 0508) Resp:  [18] 18  (01/25 0508) BP: (106-117)/(45-71) 106/45 mmHg (01/25 0508) SpO2:  [92 %-100 %] 92 % (01/25 0508)  Intake/Output from previous day: 01/24 0701 - 01/25 0700 In: 640 [P.O.:640] Out: 2300 [Urine:2300] Intake/Output this shift:     Basename 01/18/12 0600 01/17/12 0450 01/16/12 0415  HGB 9.3* 9.3* 9.8*    Basename 01/18/12 0600 01/17/12 0450  WBC 5.4 6.1  RBC 3.66* 3.58*  HCT 29.1* 28.6*  PLT 280 278    Basename 01/17/12 0450  NA 136  K 3.5  CL 105  CO2 24  BUN 12  CREATININE 0.64  GLUCOSE 97  CALCIUM 8.1*   No results found for this basename: LABPT:2,INR:2 in the last 72 hours  Intact pulses distally Incision: dressing C/D/I and no drainage No cellulitis present Decreased sensation superficial fibular nerve distribution otherwise SILT Motor- extensors of toes intact, pt unable to dorsiflex foot (common peroneal), strong plantar flexion against resistance, in derotation boot, wiggles toes. Dressing changed, improved erythema and warmth over proximal incision, no bleeding or drainage.  Incision C/D/I, staples in place.   Significantly tender to palpation left calf.  Assessment/Plan: 5 Days Post-Op Procedure(s) (LRB): INTRAMEDULLARY (IM) RETROGRADE FEMORAL NAILING (Left) FEMORAL ARTERY EXPLORATION (Left) Foot drop LLE  Up with therapy NWB LLE, bed to chair transfers. Venous doppler study r/o DVT LLE. If negative  recommend 325mg  ASA bid for DVT prophylaxis for the next 5 weeks. Daily dressing change and prn. Pending doppler result ok for d/c per orthopaedics if medically stable and medicine team agrees, today or in the next few days.  In the meantime will benefit from continued PT. Plan for d/c to [facility] SNF F/U Dr. Madelon Lips on Thursday of next week for wound check in clinic, call to schedule Continue derotation boot while in bed for foot drop.  [name] 01/18/2012, 7:45 AM

## 2012-01-18 NOTE — Progress Notes (Signed)
S: patient is eating breakfast, has no complaints. Only occasional pain in fracture site.  O:  Filed Vitals:   01/18/12 0508  BP: 106/45  Pulse: 98  Temp: 98.6 F (37 C)  Resp: 18  General: elderly woman resting in bed in no acute distress HEENT: PERRL, EOMI, no scleral icterus Cardiac: RRR, no rubs, murmurs or gallops Pulm: clear to auscultation bilaterally, moving normal volumes of air Abd: soft, nontender, nondistended, BS present Ext: warm and well perfused, no pedal edema Neuro: alert and oriented X3, cranial nerves II-XII grossly intact  A/P: will discharge to [facility] tomorrow. FL-2 will need to be signed and disharge summary done before transfer. Patient is concerned and wants to be monitored overnight for any complications of fracture. Orthopedics wants patient on aspirin 325mg  for prophylaxis post fracture for 5 weeks.

## 2012-01-19 NOTE — Progress Notes (Signed)
S: Pt Feels good. Denies any new complaints overnight.  O:  Filed Vitals:   01/19/12 0521  BP: 107/45  Pulse: 96  Temp: 97.7 F (36.5 C)  Resp: 18  General: elderly woman resting in bed in no acute distress HEENT: PERRL, EOMI, no scleral icterus Cardiac: RRR, no rubs, murmurs or gallops Pulm: clear to auscultation bilaterally, moving normal volumes of air Abd: soft, nontender, nondistended, BS present Ext: warm and well perfused, no pedal edema Neuro: alert and oriented X3, cranial nerves II-XII grossly intact  A/P: Femoral Fracture- Healing well per Ortho. Will discharge to [facility] likely on Monday. D/C summary ready.  DVT proph: Orthopedics wants patient on aspirin 325mg  for prophylaxis post fracture for 5 weeks.

## 2012-01-19 NOTE — Progress Notes (Signed)
Subjective: 6 Days Post-Op Procedure(s) (LRB): INTRAMEDULLARY (IM) RETROGRADE FEMORAL NAILING (Left) FEMORAL ARTERY EXPLORATION (Left) Patient reports pain as moderate and severe.   Pt stats the she in doing some better.  Prior to surgery, she did have some weakness in the left leg, with a mild foot drop, but not as bad as now, according to her report. She also states that she had an indwelling Foley catheter because of inability to maintain continence, and recurrent yeast infections with wet skin.   She does not feel ready to leave the hospital, and has not made any significant progress with physical therapy.  Objective: Vital signs in last 24 hours: Temp:  [97.6 F (36.4 C)-97.7 F (36.5 C)] 97.7 F (36.5 C) (01/26 0521) Pulse Rate:  [86-96] 96  (01/26 0521) Resp:  [18-20] 18  (01/26 0521) BP: (106-107)/(45-64) 107/45 mmHg (01/26 0521) SpO2:  [93 %-100 %] 93 % (01/26 0521)  Intake/Output from previous day: 01/25 0701 - 01/26 0700 In: 1120 [P.O.:520; I.V.:600] Out: 1050 [Urine:1050] Intake/Output this shift:     Basename 01/18/12 0600 01/17/12 0450  HGB 9.3* 9.3*    Basename 01/18/12 0600 01/17/12 0450  WBC 5.4 6.1  RBC 3.66* 3.58*  HCT 29.1* 28.6*  PLT 280 278    Basename 01/17/12 0450  NA 136  K 3.5  CL 105  CO2 24  BUN 12  CREATININE 0.64  GLUCOSE 97  CALCIUM 8.1*   No results found for this basename: LABPT:2,INR:2 in the last 72 hours  ABD soft Incision: dressing C/D/I and no drainage  Her left foot is in a AFO, and she has very weak dorsiflexion of the great toe. She also reports decreased sensation in the lateral aspect of her foot as well as her leg. She has some paresthesias along the medial aspect of her leg on the left side.  Assessment/Plan: 6 Days Post-Op Procedure(s) (LRB): INTRAMEDULLARY (IM) RETROGRADE FEMORAL NAILING (Left) FEMORAL ARTERY EXPLORATION (Left) Advance diet Up with therapy Discharge to SNF on Monday likely. Will defer to  the primary service regarding timing. Continue plan per Dr Madelon Lips Maintain Foley due to chronic urinary incontinence, and multiple sclerosis, recurrent yeast infection.  Haskel Khan 01/19/2012, 10:32 AM

## 2012-01-19 NOTE — Discharge Summary (Signed)
Internal Medicine Teaching [facility] Discharge Note  Name: [name] [name] MRN: [id number] DOB: 11/29/1941 71 y.o.  Date of Admission: 01/12/2012 12:51 PM Date of Discharge: 01/19/2012 Attending Physician: Lina Sayre, MD  Discharge Diagnosis:  Femoral shaft fracture- patient has spiral femoral shaft fracture repaired by Dr.Cafferty  Blood loss anemia-patient had blood loss during operation and required 4 units of blood as well as FFP and was taken back to OR with vascular surgeon Dr. Hart Rochester to stop bleeding and got 2 additional units and 1 unit the next day.  Infection due to urinary indwelling catheter: urine culture showed mixed flora but patient had large leukocytes and hemoglobin as well as many bacteria so we treated her empirically with ciprofloxacin.  Discharge Medications: Medication List  As of 01/19/2012 11:30 AM   ASK your doctor about these medications         amitriptyline 50 MG tablet   Commonly known as: ELAVIL   Take 100 mg by mouth at bedtime.      AMPYRA 10 MG Tb12   Generic drug: dalfampridine   Take 10 mg by mouth 2 (two) times daily.      docusate sodium 100 MG capsule   Commonly known as: COLACE   Take 200 mg by mouth 2 (two) times daily.      FLUoxetine 40 MG capsule   Commonly known as: PROZAC   Take 40 mg by mouth daily.      gabapentin 300 MG capsule   Commonly known as: NEURONTIN   Take 300-600 mg by mouth 3 (three) times daily. Take two capsules at lunch and one tablet at dinner and bedtime      multivitamins ther. w/minerals Tabs   Take 1 tablet by mouth daily.      naproxen sodium 220 MG tablet   Commonly known as: ANAPROX   Take 220 mg by mouth 2 (two) times daily with a meal.      omeprazole 20 MG capsule   Commonly known as: PRILOSEC   Take 20 mg by mouth 2 (two) times daily.      vitamin C 500 MG tablet   Commonly known as: ASCORBIC ACID   Take 500 mg by mouth 2 (two) times daily.            Disposition and follow-up:     [name] [name] was discharged from [facility] in Stable condition.    Follow-up Appointments: Follow-up Information    Follow up with Thane Edu, MD. Call in 1 week.      Follow up with CAFFREY JR,W D, MD. (f/u )    Contact information:   Tracey Harries, [facility] [address] [place] [address] [phone]          Consultations:   Orthopedic Surgery Vascular Surgery  Procedures Performed:  Dg Femur Left  01/13/2012  *RADIOLOGY REPORT*  Clinical Data: Intraoperative ORIF left femoral fracture  LEFT FEMUR - 2 VIEW  Comparison: 01/12/2012  Findings: Four intraoperative fluoroscopic images demonstrate a left femoral intramedullary rod fixation of the previously seen metadiaphyseal fracture.  There remains posterior angulation of the fracture fragments.  No evidence for hardware failure.  No new fracture. On presumed frontal projection, there is one half shaft width overlap of fracture fragments.  IMPRESSION: Intraoperative fixation of left femoral fracture as above.  Original Report Authenticated By: Harrel Lemon, M.D.   Dg Pelvis Portable  01/12/2012  *RADIOLOGY REPORT*  Clinical Data: Distal left  femur fracture.  PORTABLE PELVIS  Comparison: None.  Findings: 1350 hours.  The left inferior pubic rami and left femoral neck are partly obscured by the left thigh immobilization device.  There is no evidence of acute pelvic fracture or dislocation.  Mild degenerative changes are present at both hips. Left pelvic calcification may be vascular or reflect a degenerated fibroid.  IMPRESSION: No acute osseous findings demonstrated at the pelvis.  Of note, portions of the left femoral neck and left pubic rami are obscured by the left thigh immobilization device.  Original Report Authenticated By: Gerrianne Scale, M.D.   Dg Chest Portable 1 View  01/12/2012  *RADIOLOGY REPORT*  Clinical Data: Status post fall.  Possible  femur fracture.  History of chronic pneumonia and multiple sclerosis.  PORTABLE CHEST - 1 VIEW  Comparison: None.  Findings: 1400 hours.  There is mild patient rotation to the left. Heart size and mediastinal contours are normal.  There is mild volume loss in the left hemithorax with patchy left basilar pulmonary opacity.  The right lung is clear.  No pleural effusion, pneumothorax or acute osseous abnormality is identified.  IMPRESSION: Patchy left basilar pulmonary opacity is associated with volume loss and could reflect chronic scarring or atelectasis.  Without prior examinations, pneumonia cannot be excluded.  Follow-up recommended.  Original Report Authenticated By: Gerrianne Scale, M.D.   Dg Femur Left Port  01/13/2012  *RADIOLOGY REPORT*  Clinical Data: Status post fracture fixation.  PORTABLE LEFT FEMUR - 2 VIEW  Comparison: Plain films 01/12/2012.  Findings: The patient has a new IM nail with three distal interlocking screws traversing a diaphyseal fracture through the junction of the middle and distal thirds of the left femur.  There is mild medial and posterior displacement.  Gas in the soft tissues and surgical staples noted.  IMPRESSION: Status post IM nailing of a distal femur fracture as described.  Original Report Authenticated By: Bernadene Bell. D'ALESSIO, M.D.   Dg Femur Left Port  01/12/2012  *RADIOLOGY REPORT*  Clinical Data: Fall, left femur pain  PORTABLE LEFT FEMUR - 2 VIEW  Comparison: None.  Findings: There is a spiral type fracture of the distal left femoral metadiaphysis, with one shaft width overlap of the fracture fragments.  Posterior displacement of the distal fracture fragment is noted.  No radiopaque foreign body.  Overlying soft tissue swelling is evident.  IMPRESSION: Spiral type fracture of the distal left femoral metadiaphysis.  Original Report Authenticated By: Harrel Lemon, M.D.   Left Lower Extremity Doppler: History and indications:  Indications  729.81  Swelling of limb. 729.5 Pain in limb. History  Diagnostic evaluation. Left lower extremity swelling and tenderness. Status post left spiral distal femoral fracture with intramedullar retrograde femoral nailing 01-13-12.  ------------------------------------------------------------ Study information:  Portable. Study status: Routine. Procedure: A vascular evaluation was performed with the patient in the supine position. The left common femoral, left femoral, left profunda femoral, left popliteal, left peroneal, and left posterior tibial veins were studied. Image quality was adequate. The study was technically limited due to edema. Left lower extremity venous duplex evaluation. Doppler flow study including B-mode compression maneuvers of all visualized segments, color flow Doppler and selected views of pulsed wave Doppler. Location: Bedside. Patient status: Inpatient. Venous flow:  +--------------------------+-------+----------------------+ Location OverallFlow properties  +--------------------------+-------+----------------------+ Left common femoral Patent Phasic; spontaneous;    compressible  +--------------------------+-------+----------------------+ Left femoral Patent Compressible  +--------------------------+-------+----------------------+ Left profunda femoral Patent Compressible  +--------------------------+-------+----------------------+ Left popliteal Patent Phasic; spontaneous;  compressible  +--------------------------+-------+----------------------+ Left posterior tibial Patent Compressible  +--------------------------+-------+----------------------+ Left peroneal Patent Compressible  +--------------------------+-------+----------------------+ Left saphenofemoral Patent Compressible  junction     +--------------------------+-------+----------------------+  ------------------------------------------------------------ Summary: No obvious evidence of deep vein or superficial thrombosis involving the left lower extremity.  Admission HPI:  Patient is a 71 y.o. female with a PMHx of multiple sclerosis, who presents to [facility] for evaluation of left leg pain that occurred in the setting of a fall earlier today when she was transferring from her bed to a wheelchair. She said she placed her foot on the ground and was not able to keep herself upright. She heard a distinct snapping sound. She has a recent history of spiral humerus fracture in February of 2012 that required open reduction and fixation. Patient denies any other infectious symptoms, no fever, chills, nausea, vomiting, diarrhea or abdominal pain. She has been feeling well before her accident and has been successfully treating her MS with a new treatment for the past 3 months. At baseline she has been using a wheelchair to get around and can only transfer on her own. She lives alone but has a home nurse that comes twice a day and helps with bathing, meal preparation etc. [facility] and [facility] her time there. As far as her chronic issues, she has had some trouble with urinary infections and chronic constipation. Her last BM was this morning and was normal. Her urine appears deep amber colored and there is a strong urine odor in the room. She has had a foley catheter for approximately the last 6 months.  Hospital Course by problem list: Active Problems:  Femoral shaft fracture- was repaired in OR with retrograde nailing successfully, patient is not to bear weight  Blood loss anemia- patient was transfused 4 units in the OR during fracture repair and vascular surgery was called to help control bleeding. After surgery patient developed tense hematoma in groin so pressure was held on it by ICU staff and Dr.Cafferty and  Hart Rochester took patient back to OR to stop residual bleeding. She had a JP drain which then was removed as it produced diminishing amounts of sanguinous fluid over the next three days. She got 2 additional units of blood during take back to OR and 1 unit the following day to keep hemoglobin approximately between 9 and 10.  Infection due to urinary indwelling catheter: treated with ciprofloxacin IV and then transitioned to oral. Completed 9 day course in house.  Discharge Vitals:  BP 107/45  Pulse 96  Temp(Src) 97.7 F (36.5 C) (Oral)  Resp 18  Ht 5\' 4"  (1.626 m)  Wt 205 lb 11 oz (93.3 kg)  BMI 35.31 kg/m2  SpO2 93%  Discharge Labs: No results found for this or any previous visit (from the past 24 hour(s)).  SignedMargorie John 01/19/2012, 11:30 AM

## 2012-01-20 MED ORDER — NYSTATIN 100000 UNIT/GM EX POWD
Freq: Two times a day (BID) | CUTANEOUS | Status: DC
  Administered 2012-01-20 – 2012-01-21 (×3): via TOPICAL
  Filled 2012-01-20: qty 15

## 2012-01-20 NOTE — Progress Notes (Signed)
S: Pt Feels good. Denies any new complaints overnight. Had BM yesterday. Didn't sleep well overnight so is resting.  O:  Filed Vitals:   01/20/12 0505  BP: 113/64  Pulse: 89  Temp: 97.9 F (36.6 C)  Resp: 16  General: elderly woman resting in bed in no acute distress HEENT: PERRL, EOMI, no scleral icterus Cardiac: RRR, no rubs, murmurs or gallops Pulm: clear to auscultation bilaterally, moving normal volumes of air Abd: soft, nontender, mildly distended, BS present Ext: warm and well perfused, no pedal edema Neuro: alert and oriented X3, cranial nerves II-XII grossly intact  A/P: Femoral Fracture- Healing well per Ortho. Will discharge to [facility] tomorrow D/C summary ready.  DVT proph: Orthopedics wants patient on aspirin 325mg  for prophylaxis post fracture for 5 weeks.

## 2012-01-20 NOTE — Progress Notes (Signed)
Procedure(s) (LRB): INTRAMEDULLARY (IM) RETROGRADE FEMORAL NAILING (Left) FEMORAL ARTERY EXPLORATION (Left) 7 Days Post-Op   Subjective:  Patient reports pain as mild. She is overall doing well.  Objective:   VITALS:  BP 113/64  Pulse 89  Temp(Src) 97.9 F (36.6 C) (Oral)  Resp 16  Ht 5\' 4"  (1.626 m)  Wt 93.3 kg (205 lb 11 oz)  BMI 35.31 kg/m2  SpO2 94%  Her dressings were changed and her wounds are clean and dry. EHL is still weak on the left side.  LABS Lab Results  Component Value Date   HGB 9.3* 01/18/2012   HGB 9.3* 01/17/2012   HGB 9.8* 01/16/2012   Lab Results  Component Value Date   WBC 5.4 01/18/2012   PLT 280 01/18/2012   Lab Results  Component Value Date   INR 1.37 01/14/2012   Lab Results  Component Value Date   NA 136 01/17/2012   K 3.5 01/17/2012   CL 105 01/17/2012   CO2 24 01/17/2012   BUN 12 01/17/2012   CREATININE 0.64 01/17/2012   GLUCOSE 97 01/17/2012    Assessment/Plan: Active Problems:  Femoral shaft fracture  Blood loss anemia  Infection due to urinary indwelling catheter  She seems to overall be improving. I suspect she will be okay to go to her skilled nursing facility on Monday. Nonweightbearing, left lower extremity.   Takuya Lariccia P 01/20/2012, 7:12 AM

## 2012-01-21 LAB — BASIC METABOLIC PANEL
BUN: 12 mg/dL (ref 6–23)
Calcium: 8.7 mg/dL (ref 8.4–10.5)
Chloride: 105 mEq/L (ref 96–112)
Creatinine, Ser: 0.63 mg/dL (ref 0.50–1.10)
GFR calc Af Amer: >90 mL/min (ref >90)
GFR calc non Af Amer: 89 mL/min — ABNORMAL LOW (ref >90)

## 2012-01-21 LAB — CBC
MCHC: 31.6 g/dL (ref 30.0–36.0)
MCV: 80.8 fL (ref 78.0–100.0)
Platelets: 349 10*3/uL (ref 150–400)
RDW: 21 % — ABNORMAL HIGH (ref 11.5–15.5)
WBC: 5.5 10*3/uL (ref 4.0–10.5)

## 2012-01-21 MED ORDER — OXYCODONE HCL 5 MG PO TABS: 5.0000 mg | ORAL_TABLET | ORAL | Status: AC | PRN

## 2012-01-21 MED ORDER — ASPIRIN 325 MG PO TABS: 325.0000 mg | ORAL_TABLET | Freq: Two times a day (BID) | ORAL | Status: DC

## 2012-01-21 MED ORDER — POLYETHYLENE GLYCOL 3350 17 G PO PACK: 17.0000 g | PACK | Freq: Every day | ORAL | Status: AC

## 2012-01-21 NOTE — Progress Notes (Signed)
Occupational Therapy Treatment Patient Details Name: [name] [name] MRN: [id number] DOB: [birth year] Today's Date: 01/21/2012  OT Assessment/Plan OT Assessment/Plan Comments on Treatment Session: Pt. progressing well and very motivated for OOB activity OT Plan: Discharge plan remains appropriate OT Frequency: Min 1X/week Follow Up Recommendations: Skilled nursing facility Equipment Recommended: Defer to next venue OT Goals Acute Rehab OT Goals OT Goal Formulation: With patient Time For Goal Achievement: 2 weeks ADL Goals Pt Will Perform Grooming: with set-up;Sitting, chair ADL Goal: Grooming - Progress: Progressing toward goals Pt Will Perform Upper Body Bathing: with set-up;with supervision;Sitting at sink ADL Goal: Upper Body Bathing - Progress: Not met Pt Will Perform Upper Body Dressing: with set-up;Sitting, bed;Unsupported ADL Goal: Upper Body Dressing - Progress: Progressing toward goals Pt Will Perform Lower Body Dressing: with mod assist;Sit to stand from bed ADL Goal: Lower Body Dressing - Progress: Not met Pt Will Transfer to Toilet: with mod assist;Stand pivot transfer;with DME;3-in-1 ADL Goal: Toilet Transfer - Progress: Not met Pt Will Perform Toileting - Clothing Manipulation: with min assist;Standing ADL Goal: Toileting - Clothing Manipulation - Progress: Not met Pt Will Perform Toileting - Hygiene: with min assist;Sit to stand from 3-in-1/toilet ADL Goal: Toileting - Hygiene - Progress: Not met Additional ADL Goal #1: Pt will be I with bilateral UE strengthening HEP in prep for ADL and ADL mobility. ADL Goal: Additional Goal #1 - Progress: Not met  OT Treatment Precautions/Restrictions  Precautions Precautions: Fall Restrictions Weight Bearing Restrictions: Yes LLE Weight Bearing: Non weight bearing   ADL ADL Grooming: Performed;Set up;Minimal assistance Grooming Details (indicate cue type and reason): Min assist to keep upright position due to Rt. lateral  lean Where Assessed - Grooming: Sitting, bed Upper Body Dressing: Performed;Moderate assistance Upper Body Dressing Details (indicate cue type and reason): with donning gown due to bil UE weakness Where Assessed - Upper Body Dressing: Sitting, bed ADL Comments: pt. completed ADL tasks sitting EOB with max-min assist for sitting balance ~ . pt. completed bil UE reaching activities crossing midline to reach for objects to improve dynamic sitting balance. Pt. with decreased acivity tolerance throughout session and required 3 rest breaks throughout ~30sec's each.   Mobility  Bed Mobility Bed Mobility: Yes Supine to Sit: 1: +2 Total assist;With rails (pt=20%) Supine to Sit Details (indicate cue type and reason): Max verbal cues for transfer technique and use of pad to scoot hips Sit to Supine: 1: +2 Total assist (pt =10%) Transfers Transfers: No    End of Session OT - End of Session Activity Tolerance: Patient tolerated treatment well Patient left: in bed;with call bell in reach;with bed alarm set Nurse Communication: Mobility status for transfers General Behavior During Session: [facility] for tasks performed Cognition: [facility] for tasks performed  Ellia Knowlton, OTR/L Pager [phone]  01/21/2012, 12:45 PM

## 2012-01-21 NOTE — Progress Notes (Signed)
Physical Therapy Treatment Patient Details Name: [name] [name] MRN: [id number] DOB: Jul 25, 1941 Today's Date: 01/21/2012  PT Assessment/Plan  PT - Assessment/Plan Comments on Treatment Session: Patient tolerated EOB sitting x 30 min today with minA. Patient remains to have extremely limited L LE active ROM and strength. PT Plan: Discharge plan remains appropriate Follow Up Recommendations: Skilled nursing facility PT Goals  Acute Rehab PT Goals PT Goal: Supine/Side to Sit - Progress: Progressing toward goal PT Goal: Sit to Supine/Side - Progress: Progressing toward goal PT Transfer Goal: Bed to Chair/Chair to Bed - Progress: Progressing toward goal  PT Treatment Precautions/Restrictions  Precautions Precautions: Fall Restrictions Weight Bearing Restrictions: Yes LLE Weight Bearing: Non weight bearing Mobility (including Balance) Bed Mobility Supine to Sit: 1: +2 Total assist;With rails (pt=20%) Sit to Supine: 1: +2 Total assist (pt =10%)  Static Sitting Balance Static Sitting - Balance Support: Bilateral upper extremity supported;No upper extremity supported (sat EOB x 30 min with various UE support. Static Sitting - Level of Assistance: 4: Min assist (varied from min to mod A depending on task. + L lateral lean) Exercise  General Exercises - Lower Extremity Ankle Circles/Pumps: AROM;Both;10 reps;Seated (decreased L EHL strength) Long Arc Quad: AROM;Both;10 reps;Seated (assist for L LE) Hip Flexion/Marching: AROM;Seated;Right;10 reps (assist due to decreased strength) End of Session PT - End of Session Equipment Utilized During Treatment: Gait belt Activity Tolerance: Patient tolerated treatment well Patient left: in bed;with call bell in reach General Behavior During Session: [facility] for tasks performed Cognition: [facility] for tasks performed  Treatment completed with OT due to increased trunk support required for EOB sitting.  Marcene Brawn 01/21/2012, 12:23 PM

## 2012-01-21 NOTE — Progress Notes (Signed)
Clinical Social Work-CSW facilitated pt d/c to [place] with [facility] transportation, chart copy and FL2-pt and [facility] SW notified-No further needs at this time-Lela Murfin-MSW, [phone]

## 2012-01-22 NOTE — Progress Notes (Signed)
   CARE MANAGEMENT NOTE 01/22/2012  Patient:  [name] [name],[name] [name]   Account Number:  [id number]  Date Initiated:  01/15/2012  Documentation initiated by:  Ronny Flurry  Subjective/Objective Assessment:   HX: MS , chronic Foley  01/14/2012 Pt most likely will need SNF.     Action/Plan:   01/21/2012 Pt d/c to SNF   Anticipated DC Date:  01/21/2012   Anticipated DC Plan:  SKILLED NURSING FACILITY         Choice offered to / List presented to:             Status of service:  Completed, signed off Medicare Important Message given?   (If response is "NO", the following Medicare IM given date fields will be blank) Date Medicare IM given:   Date Additional Medicare IM given:    Discharge Disposition:  SKILLED NURSING FACILITY  Per UR Regulation:  Reviewed for med. necessity/level of care/duration of stay  Comments:  01-13-12 Closed reduction and femoral nail, bleeding in the groin post repair left femoral fracture, went back to OR for WOUND EXPLORATION left groin with evacuation hematoma and mobilization left superficial femoral artery and vein . Received total of 6 units PRCB's on 01-13-12 and 1 unit on 01-14-12.

## 2012-02-02 ENCOUNTER — Other Ambulatory Visit: Payer: Self-pay

## 2012-02-02 ENCOUNTER — Inpatient Hospital Stay (HOSPITAL_COMMUNITY)
Admission: EM | Admit: 2012-02-02 | Discharge: 2012-02-12 | DRG: 871 | Disposition: A | Discharge status: Skilled Nursing Facility | Payer: Medicare (Managed Care) | Attending: Internal Medicine | Admitting: Internal Medicine

## 2012-02-02 ENCOUNTER — Emergency Department (HOSPITAL_COMMUNITY): Payer: Medicare (Managed Care)

## 2012-02-02 ENCOUNTER — Encounter (HOSPITAL_COMMUNITY): Payer: Self-pay | Admitting: *Deleted

## 2012-02-02 DIAGNOSIS — Z66 Do not resuscitate: Secondary | ICD-10-CM | POA: Diagnosis present

## 2012-02-02 DIAGNOSIS — IMO0002 Reserved for concepts with insufficient information to code with codable children: Secondary | ICD-10-CM | POA: Diagnosis present

## 2012-02-02 DIAGNOSIS — A419 Sepsis, unspecified organism: Secondary | ICD-10-CM

## 2012-02-02 DIAGNOSIS — N3000 Acute cystitis without hematuria: Secondary | ICD-10-CM

## 2012-02-02 DIAGNOSIS — Z6831 Body mass index (BMI) 31.0-31.9, adult: Secondary | ICD-10-CM

## 2012-02-02 DIAGNOSIS — E876 Hypokalemia: Secondary | ICD-10-CM | POA: Diagnosis present

## 2012-02-02 DIAGNOSIS — R6521 Severe sepsis with septic shock: Secondary | ICD-10-CM

## 2012-02-02 DIAGNOSIS — T83511A Infection and inflammatory reaction due to indwelling urethral catheter, initial encounter: Secondary | ICD-10-CM | POA: Diagnosis present

## 2012-02-02 DIAGNOSIS — D649 Anemia, unspecified: Secondary | ICD-10-CM | POA: Diagnosis present

## 2012-02-02 DIAGNOSIS — E669 Obesity, unspecified: Secondary | ICD-10-CM

## 2012-02-02 DIAGNOSIS — Y92009 Unspecified place in unspecified non-institutional (private) residence as the place of occurrence of the external cause: Secondary | ICD-10-CM

## 2012-02-02 DIAGNOSIS — R7989 Other specified abnormal findings of blood chemistry: Secondary | ICD-10-CM | POA: Diagnosis present

## 2012-02-02 DIAGNOSIS — N179 Acute kidney failure, unspecified: Secondary | ICD-10-CM | POA: Diagnosis present

## 2012-02-02 DIAGNOSIS — B370 Candidal stomatitis: Secondary | ICD-10-CM | POA: Diagnosis present

## 2012-02-02 DIAGNOSIS — R630 Anorexia: Secondary | ICD-10-CM | POA: Diagnosis present

## 2012-02-02 DIAGNOSIS — N289 Disorder of kidney and ureter, unspecified: Secondary | ICD-10-CM

## 2012-02-02 DIAGNOSIS — E875 Hyperkalemia: Secondary | ICD-10-CM | POA: Diagnosis present

## 2012-02-02 DIAGNOSIS — N390 Urinary tract infection, site not specified: Secondary | ICD-10-CM

## 2012-02-02 DIAGNOSIS — R0902 Hypoxemia: Secondary | ICD-10-CM | POA: Diagnosis present

## 2012-02-02 DIAGNOSIS — G35 Multiple sclerosis: Secondary | ICD-10-CM

## 2012-02-02 DIAGNOSIS — F3289 Other specified depressive episodes: Secondary | ICD-10-CM | POA: Diagnosis present

## 2012-02-02 DIAGNOSIS — J189 Pneumonia, unspecified organism: Secondary | ICD-10-CM

## 2012-02-02 DIAGNOSIS — F329 Major depressive disorder, single episode, unspecified: Secondary | ICD-10-CM | POA: Diagnosis present

## 2012-02-02 DIAGNOSIS — Y846 Urinary catheterization as the cause of abnormal reaction of the patient, or of later complication, without mention of misadventure at the time of the procedure: Secondary | ICD-10-CM | POA: Diagnosis present

## 2012-02-02 DIAGNOSIS — I82619 Acute embolism and thrombosis of superficial veins of unspecified upper extremity: Secondary | ICD-10-CM | POA: Diagnosis present

## 2012-02-02 DIAGNOSIS — M7989 Other specified soft tissue disorders: Secondary | ICD-10-CM | POA: Diagnosis present

## 2012-02-02 DIAGNOSIS — R6520 Severe sepsis without septic shock: Secondary | ICD-10-CM | POA: Diagnosis present

## 2012-02-02 DIAGNOSIS — E162 Hypoglycemia, unspecified: Secondary | ICD-10-CM | POA: Diagnosis present

## 2012-02-02 LAB — BLOOD GAS, ARTERIAL
Acid-base deficit: 7.2 mmol/L — ABNORMAL HIGH (ref 0.0–2.0)
Bicarbonate: 17.3 mEq/L — ABNORMAL LOW (ref 20.0–24.0)
O2 Saturation: 99 %
TCO2: 16 mmol/L (ref 0–100)
pCO2 arterial: 33.3 mmHg — ABNORMAL LOW (ref 35.0–45.0)
pO2, Arterial: 155 mmHg — ABNORMAL HIGH (ref 80.0–100.0)

## 2012-02-02 LAB — BASIC METABOLIC PANEL
CO2: 20 mEq/L (ref 19–32)
Chloride: 109 mEq/L (ref 96–112)
GFR calc Af Amer: 30 mL/min — ABNORMAL LOW (ref >90)
Potassium: 4.9 mEq/L (ref 3.5–5.1)

## 2012-02-02 LAB — URINALYSIS, ROUTINE W REFLEX MICROSCOPIC
Bilirubin Urine: NEGATIVE
Ketones, ur: NEGATIVE mg/dL
Specific Gravity, Urine: 1.021 (ref 1.005–1.030)
Urobilinogen, UA: 1 mg/dL (ref 0.0–1.0)

## 2012-02-02 LAB — COMPREHENSIVE METABOLIC PANEL
Alkaline Phosphatase: 388 U/L — ABNORMAL HIGH (ref 39–117)
BUN: 51 mg/dL — ABNORMAL HIGH (ref 6–23)
CO2: 21 mEq/L (ref 19–32)
Chloride: 103 mEq/L (ref 96–112)
Creatinine, Ser: 2.28 mg/dL — ABNORMAL HIGH (ref 0.50–1.10)
GFR calc Af Amer: 24 mL/min — ABNORMAL LOW (ref >90)
GFR calc non Af Amer: 21 mL/min — ABNORMAL LOW (ref >90)
Glucose, Bld: 102 mg/dL — ABNORMAL HIGH (ref 70–99)
Potassium: 6.2 mEq/L — ABNORMAL HIGH (ref 3.5–5.1)
Total Bilirubin: 1.2 mg/dL (ref 0.3–1.2)

## 2012-02-02 LAB — GLUCOSE, CAPILLARY: Glucose-Capillary: 183 mg/dL — ABNORMAL HIGH (ref 70–99)

## 2012-02-02 LAB — TROPONIN I: Troponin I: <0.3 ng/mL (ref <0.30)

## 2012-02-02 LAB — PROCALCITONIN: Procalcitonin: 40.16 ng/mL

## 2012-02-02 LAB — LACTIC ACID, PLASMA: Lactic Acid, Venous: 3.3 mmol/L — ABNORMAL HIGH (ref 0.5–2.2)

## 2012-02-02 LAB — URINE MICROSCOPIC-ADD ON

## 2012-02-02 LAB — DIFFERENTIAL
Basophils Relative: 0 % (ref 0–1)
Lymphocytes Relative: 11 % — ABNORMAL LOW (ref 12–46)
Lymphs Abs: 1.3 10*3/uL (ref 0.7–4.0)
Monocytes Relative: 16 % — ABNORMAL HIGH (ref 3–12)
Neutro Abs: 8.8 10*3/uL — ABNORMAL HIGH (ref 1.7–7.7)

## 2012-02-02 LAB — CK TOTAL AND CKMB (NOT AT ARMC): Total CK: 16 U/L (ref 7–177)

## 2012-02-02 LAB — CBC
HCT: 37.5 % (ref 36.0–46.0)
Hemoglobin: 12.3 g/dL (ref 12.0–15.0)
MCV: 78.8 fL (ref 78.0–100.0)
WBC: 12 10*3/uL — ABNORMAL HIGH (ref 4.0–10.5)

## 2012-02-02 MED ORDER — SODIUM CHLORIDE 0.9 % IV SOLN
INTRAVENOUS | Status: DC
  Administered 2012-02-04 – 2012-02-06 (×3): via INTRAVENOUS

## 2012-02-02 MED ORDER — NOREPINEPHRINE BITARTRATE 1 MG/ML IJ SOLN
2.0000 ug/min | INTRAVENOUS | Status: DC
  Filled 2012-02-02: qty 4

## 2012-02-02 MED ORDER — SODIUM CHLORIDE 0.9 % IV SOLN
700.0000 mg | Freq: Once | INTRAVENOUS | Status: AC
  Administered 2012-02-02: 700 mg via INTRAVENOUS
  Filled 2012-02-02: qty 7

## 2012-02-02 MED ORDER — CALCIUM CHLORIDE 10 % IV SOLN
1.0000 g | Freq: Once | INTRAVENOUS | Status: DC
  Filled 2012-02-02 (×2): qty 10

## 2012-02-02 MED ORDER — SODIUM CHLORIDE 0.9 % IV BOLUS (SEPSIS)
500.0000 mL | Freq: Once | INTRAVENOUS | Status: AC
  Administered 2012-02-02: 500 mL via INTRAVENOUS

## 2012-02-02 MED ORDER — DEXTROSE 5 % IV SOLN
2.0000 ug/min | INTRAVENOUS | Status: DC
  Administered 2012-02-02 (×2): 20 ug/min via INTRAVENOUS
  Administered 2012-02-03: 6 ug/min via INTRAVENOUS
  Administered 2012-02-03: 10 ug/min via INTRAVENOUS
  Filled 2012-02-02 (×4): qty 4

## 2012-02-02 MED ORDER — INSULIN ASPART 100 UNIT/ML IV SOLN
10.0000 [IU] | Freq: Once | INTRAVENOUS | Status: AC
  Administered 2012-02-02: 10 [IU] via INTRAVENOUS
  Filled 2012-02-02: qty 0.1

## 2012-02-02 MED ORDER — DEXTROSE 50 % IV SOLN
25.0000 mL | Freq: Once | INTRAVENOUS | Status: DC
  Administered 2012-02-02: 25 mL via INTRAVENOUS
  Filled 2012-02-02: qty 50

## 2012-02-02 MED ORDER — ASPIRIN 81 MG PO CHEW
324.0000 mg | CHEWABLE_TABLET | ORAL | Status: AC
  Administered 2012-02-03: 324 mg via ORAL
  Filled 2012-02-02: qty 4

## 2012-02-02 MED ORDER — SODIUM CHLORIDE 0.9 % IV SOLN
Freq: Once | INTRAVENOUS | Status: AC
  Administered 2012-02-02: 13:00:00 via INTRAVENOUS

## 2012-02-02 MED ORDER — PIPERACILLIN-TAZOBACTAM 3.375 G IVPB 30 MIN: 3.3750 g | Freq: Three times a day (TID) | INTRAVENOUS | Status: DC

## 2012-02-02 MED ORDER — NOREPINEPHRINE BITARTRATE 1 MG/ML IJ SOLN
2.0000 ug/min | Freq: Once | INTRAVENOUS | Status: AC
  Administered 2012-02-02: 5 ug/min via INTRAVENOUS
  Filled 2012-02-02: qty 4

## 2012-02-02 MED ORDER — SODIUM CHLORIDE 0.9 % IV SOLN
250.0000 mL | INTRAVENOUS | Status: DC | PRN
  Administered 2012-02-09: 20 mL via INTRAVENOUS

## 2012-02-02 MED ORDER — INSULIN REGULAR HUMAN 100 UNIT/ML IJ SOLN
10.0000 [IU] | Freq: Once | INTRAMUSCULAR | Status: DC
  Administered 2012-02-02: 10 [IU] via SUBCUTANEOUS

## 2012-02-02 MED ORDER — HEPARIN SODIUM (PORCINE) 5000 UNIT/ML IJ SOLN
5000.0000 [IU] | Freq: Three times a day (TID) | INTRAMUSCULAR | Status: DC
  Administered 2012-02-02 – 2012-02-12 (×29): 5000 [IU] via SUBCUTANEOUS
  Filled 2012-02-02 (×33): qty 1

## 2012-02-02 MED ORDER — VANCOMYCIN HCL IN DEXTROSE 1-5 GM/200ML-% IV SOLN
1000.0000 mg | Freq: Once | INTRAVENOUS | Status: AC
  Administered 2012-02-02: 1000 mg via INTRAVENOUS
  Filled 2012-02-02: qty 200

## 2012-02-02 MED ORDER — SODIUM POLYSTYRENE SULFONATE 15 GM/60ML PO SUSP
30.0000 g | Freq: Once | ORAL | Status: AC
  Administered 2012-02-02: 30 g via RECTAL
  Filled 2012-02-02: qty 120

## 2012-02-02 MED ORDER — PIPERACILLIN-TAZOBACTAM 3.375 G IVPB
3.3750 g | Freq: Three times a day (TID) | INTRAVENOUS | Status: DC
  Filled 2012-02-02 (×2): qty 50

## 2012-02-02 MED ORDER — SODIUM CHLORIDE 0.9 % IV SOLN
500.0000 mg | Freq: Two times a day (BID) | INTRAVENOUS | Status: DC
  Administered 2012-02-03: 500 mg via INTRAVENOUS
  Filled 2012-02-02 (×4): qty 0.5

## 2012-02-02 MED ORDER — SODIUM CHLORIDE 0.9 % IV SOLN
500.0000 mg | INTRAVENOUS | Status: AC
  Administered 2012-02-02: 500 mg via INTRAVENOUS
  Filled 2012-02-02: qty 0.5

## 2012-02-02 MED ORDER — PIPERACILLIN-TAZOBACTAM 3.375 G IVPB 30 MIN
3.3750 g | Freq: Once | INTRAVENOUS | Status: AC
  Administered 2012-02-02: 3.375 g via INTRAVENOUS
  Filled 2012-02-02: qty 50

## 2012-02-02 MED ORDER — SODIUM CHLORIDE 0.9 % IV BOLUS (SEPSIS)
1000.0000 mL | Freq: Once | INTRAVENOUS | Status: AC
  Administered 2012-02-02: 1000 mL via INTRAVENOUS

## 2012-02-02 MED ORDER — SODIUM BICARBONATE 8.4 % IV SOLN
25.0000 meq | Freq: Once | INTRAVENOUS | Status: AC
  Administered 2012-02-02: 25 meq via INTRAVENOUS
  Filled 2012-02-02: qty 50

## 2012-02-02 MED ORDER — CALCIUM CHLORIDE 10 % IV SOLN: 350.0000 mg | Freq: Once | INTRAVENOUS | Status: DC

## 2012-02-02 MED ORDER — DEXTROSE 50 % IV SOLN
50.0000 mL | Freq: Once | INTRAVENOUS | Status: AC
  Administered 2012-02-02: 50 mL via INTRAVENOUS

## 2012-02-02 NOTE — H&P (Addendum)
Name: [name] [name] MRN: [id number] DOB: 03/23/1941    LOS: 0 PCP -  [name], [facility]   PCCM ADMISSION NOTE  History of Present Illness: 71 y.o. Female, NHR with a PMHx of multiple sclerosis, chronic indwelling  foley catheter for approximately the last 6 months, presents 2/9 with a sepsis syndrome - fever 102.7, high lactate, low BP- not responding to 3 L fluids in ER. Foley was changed - UA pos, given zosyn, vanc, BP improved to 90's ,femoral CVL placed by EDP. PCCM asked to assume care. DNR noted & discussed At baseline, she is wheelchair bound, interactive. Lt femoral fracture s/p ORIF in 1/13  Lines / Drains: Rt fem CVL  2/9 >>  Cultures: 2/9 urine >> 2/9 bld >>  Antibiotics: Zosyn 2/9 >> vanc 2/9>>  Tests / Events:   The patient is lethargic and unable to provide history, which was obtained for available medical records.    Past Medical History  Diagnosis Date  . Multiple sclerosis   . GERD (gastroesophageal reflux disease)   . Depression   . Recurrent UTI   . Recurrent pneumonia   . Chronic constipation   . Diverticulosis    Past Surgical History  Procedure Date  . Cholecystectomy   . Orif humeral shaft fracture 01/2011  . Video assisted thoracoscopy 06/2010  . Closed reduction shoulder dislocation 04/2010    Fracture dislocation  . Femur im nail 01/13/2012    Procedure: INTRAMEDULLARY (IM) RETROGRADE FEMORAL NAILING;  Surgeon: Thera Flake., MD;  Location: [facility] OR;  Service: Orthopedics;  Laterality: Left;  . Femoral artery exploration 01/13/2012    Procedure: FEMORAL ARTERY EXPLORATION;  Surgeon: Pryor Ochoa, MD;  Location: [facility] OR;  Service: Vascular;  Laterality: Left;   Prior to Admission medications   Medication Sig Start Date End Date Taking? Authorizing Provider  amitriptyline (ELAVIL) 50 MG tablet Take 100 mg by mouth at bedtime.      Historical Provider, MD  aspirin 325 MG tablet Take 1 tablet (325 mg total) by mouth 2 (two) times daily. 01/21/12 01/20/13   Margorie John, MD  dalfampridine (AMPYRA) 10 MG TB12 Take 10 mg by mouth 2 (two) times daily.      Historical Provider, MD  docusate sodium (COLACE) 100 MG capsule Take 200 mg by mouth 2 (two) times daily.      Historical Provider, MD  FLUoxetine (PROZAC) 40 MG capsule Take 40 mg by mouth daily.      Historical Provider, MD  gabapentin (NEURONTIN) 300 MG capsule Take 300-600 mg by mouth 3 (three) times daily. Take two capsules at lunch and one tablet at dinner and bedtime    Historical Provider, MD  Multiple Vitamins-Minerals (MULTIVITAMINS THER. W/MINERALS) TABS Take 1 tablet by mouth daily.      Historical Provider, MD  omeprazole (PRILOSEC) 20 MG capsule Take 20 mg by mouth 2 (two) times daily.      Historical Provider, MD  vitamin C (ASCORBIC ACID) 500 MG tablet Take 500 mg by mouth 2 (two) times daily.      Historical Provider, MD   Allergies No Known Allergies  Family History History reviewed. No pertinent family history.  Social History  reports that she has never smoked. She does not have any smokeless tobacco history on file. She reports that she does not drink alcohol or use illicit drugs.  Review Of Systems  11 points review of systems is negative with an exception of listed in HPI. Constitutional: negative for anorexia,  Fevers+ and sweats  Eyes: negative for irritation, redness and visual disturbance  Ears, nose, mouth, throat, and face: negative for earaches, epistaxis, nasal congestion and sore throat  Respiratory: negative for cough, dyspnea on exertion, sputum and wheezing  Cardiovascular: negative for chest pain, dyspnea, lower extremity edema, orthopnea, palpitations and syncope  Gastrointestinal: negative for abdominal pain, diarrhea, melena, nausea and vomiting , constipation + Genitourinary:negative for dysuria, frequency and hematuria  Hematologic/lymphatic: negative for bleeding, easy bruising and lymphadenopathy  Musculoskeletal:negative for arthralgias, muscle  weakness and stiff joints  Neurological: negative for coordination problems, gait problems, headaches and weakness  Endocrine: negative for diabetic symptoms including polydipsia, polyuria and weight loss   Vital Signs: Temp:  [98.3 F (36.8 C)-100.7 F (38.2 C)] 100.4 F (38 C) (02/09 1345) Pulse Rate:  [106-111] 106  (02/09 1345) Resp:  [21-27] 23  (02/09 1345) BP: (70-88)/(40-52) 74/42 mmHg (02/09 1345) SpO2:  [94 %-97 %] 95 % (02/09 1345)    Physical Examination: General:  Chronically ill appearing, woman , in mild  distress Neuro:  Follows 1 step commands, unable to move left side -chronic per daughter   HEENT:  Dry mucosa, no pallor, citerus Neck:  No jvd   Cardiovascular:  s1s2 tachy Lungs:  Crackles lt, clear rt Abdomen:  Soft, non tender, sediment in foley Musculoskeletal:  Lt LE in boot Skin:  Dry turgor, no rash    Labs and Imaging:  Reviewed.   CBC    Component Value Date/Time   WBC 12.0* 02/02/2012 1240   RBC 4.76 02/02/2012 1240   HGB 12.3 02/02/2012 1240   HCT 37.5 02/02/2012 1240   PLT 383 02/02/2012 1240   MCV 78.8 02/02/2012 1240   MCH 25.8* 02/02/2012 1240   MCHC 32.8 02/02/2012 1240   RDW 20.3* 02/02/2012 1240   LYMPHSABS 1.3 02/02/2012 1240   MONOABS 1.9* 02/02/2012 1240   EOSABS 0.0 02/02/2012 1240   BASOSABS 0.0 02/02/2012 1240    BMET    Component Value Date/Time   NA 137 02/02/2012 1240   K 6.2* 02/02/2012 1240   CL 103 02/02/2012 1240   CO2 21 02/02/2012 1240   GLUCOSE 102* 02/02/2012 1240   BUN 51* 02/02/2012 1240   CREATININE 2.28* 02/02/2012 1240   CALCIUM 10.4 02/02/2012 1240   GFRNONAA 21* 02/02/2012 1240   GFRAA 24* 02/02/2012 1240    pCXR - low volumes, bibasla atx & effusions  Assessment and Plan: Septic shock due to UTI  - Treat for nosocomial organism - rceived zosyn , will use IMipenem to cover ESBL organisms Discussed advance directives with daughter - they are ok with pressors but no machines - no CPR/ cardioversion or intubation -Mild hypoxia , concern  for fluid overload , will drop fluids to 125/h -use levophed to goal MAP 65 & above -doubt PE here , note neg duplex BLEs 1/25, but if she survives acute illness consider duplex BLEs to r/o DVT  Acute renal failure - FLuids, get MAP up, unfortunately no CVp here to guide fluids.  Hyperkalemia - treated with cocktail in ED >> rechk BMET at 1900, hopefully will improve with fluids  High alkphos/ mild elevated LFTs  - unclear etio, s/p cholechsytectomy  Multiple sclerosis - avoid sedating meds - but if she appears in resp distress inspite of oxygen, we will have to change gears & use low dose morphine for comfort - have d/w family   Best practices / Disposition: -->ICU status under PCCM -->DNR except pressors -->Heparin for  DVT Px -->family updated at bedside  The patient is critically ill with multiple organ systems failure and requires high complexity decision making for assessment and support, frequent evaluation and titration of therapies, application of advanced monitoring technologies and extensive interpretation of multiple databases. Critical Care Time devoted to patient care services described in this note is 45 minutes.  Inza Mikrut V. 02/02/2012, 2:20 PM

## 2012-02-02 NOTE — ED Notes (Signed)
Report called to Amy in ICU

## 2012-02-02 NOTE — ED Notes (Signed)
Elevated temp of 103.7 at 0930, received Tylenol supp at facility, recheck of temp 102.5. Pt is obtunded, will respond to aggressive verbal stimuli.

## 2012-02-02 NOTE — ED Notes (Signed)
Pt Map at 54. Dose of levophed increased to 73mcg/min

## 2012-02-02 NOTE — Progress Notes (Signed)
ANTIBIOTIC CONSULT NOTE - INITIAL  Pharmacy Consult for Meropenem Indication: urosepsis with possibility of +ESBL  No Known Allergies  Patient Measurements:   Weight 93 kg, Height 64 in, Adjusted Weight ~66kg  Vital Signs: Temp: 99.5 F (37.5 C) (02/09 1445) Temp src: Core (Comment) (02/09 1305) BP: 99/42 mmHg (02/09 1445) Pulse Rate: 108  (02/09 1445) Intake/Output from previous day:   Intake/Output from this shift:    Labs:  Basename 02/02/12 1240  WBC 12.0*  HGB 12.3  PLT 383  LABCREA --  CREATININE 2.28*   The CrCl is unknown because both a height and weight (above a minimum accepted value) are required for this calculation. No results found for this basename: VANCOTROUGH:2,VANCOPEAK:2,VANCORANDOM:2,GENTTROUGH:2,GENTPEAK:2,GENTRANDOM:2,TOBRATROUGH:2,TOBRAPEAK:2,TOBRARND:2,AMIKACINPEAK:2,AMIKACINTROU:2,AMIKACIN:2, in the last 72 hours   Microbiology: Recent Results (from the past 720 hour(s))  URINE CULTURE     Status: Normal   Collection Time   01/12/12  2:35 PM      Component Value Range Status Comment   Specimen Description URINE, CLEAN CATCH   Final    Special Requests NONE   Final    Culture  Setup Time [id number]   Final    Colony Count >=100,000 COLONIES/ML   Final    Culture     Final    Value: Multiple bacterial morphotypes present, none predominant. Suggest appropriate recollection if clinically indicated.   Report Status 01/13/2012 FINAL   Final   SURGICAL PCR SCREEN     Status: Normal   Collection Time   01/13/12  1:24 AM      Component Value Range Status Comment   MRSA, PCR NEGATIVE  NEGATIVE  Final    Staphylococcus aureus NEGATIVE  NEGATIVE  Final   MRSA PCR SCREENING     Status: Normal   Collection Time   01/13/12  1:31 PM      Component Value Range Status Comment   MRSA by PCR NEGATIVE  NEGATIVE  Final     Medical History: Past Medical History  Diagnosis Date  . Multiple sclerosis   . GERD (gastroesophageal reflux disease)   .  Depression   . Recurrent UTI   . Recurrent pneumonia   . Chronic constipation   . Diverticulosis     Medications:  Scheduled:    . sodium chloride   Intravenous Once  . calcium chloride  1 g Intravenous Once  . dextrose  50 mL Intravenous Once  . heparin  5,000 Units Subcutaneous Q8H  . insulin aspart  10 Units Intravenous Once  . norepinephrine (LEVOPHED) Adult infusion  2-50 mcg/min Intravenous Once  . piperacillin-tazobactam  3.375 g Intravenous Once  . sodium bicarbonate  25 mEq Intravenous Once  . sodium chloride  1,000 mL Intravenous Once  . sodium polystyrene  30 g Rectal Once  . vancomycin  1,000 mg Intravenous Once  . DISCONTD: dextrose  25 mL Intravenous Once  . DISCONTD: insulin regular  10 Units Subcutaneous Once  . DISCONTD: piperacillin-tazobactam  3.375 g Intravenous Q8H  . DISCONTD: piperacillin-tazobactam (ZOSYN)  IV  3.375 g Intravenous Q8H   Assessment: 71 yo female to be admitted with septic shock likely due to a UTI with plan to use Meropenem to cover possibility of +ESBL producing bacteria as well. Estimated CG CrCl is 24 ml/min and Normalized CrCl estimate is 26 ml/min/1.67m2  Goal of Therapy:  adjustment per renal function  Plan:  1. For CrCl 10-30 ml/min, start 500mg  IV q12 2. Will continue to monitor renal function for possible dosage changes  Berkley Harvey 02/02/2012,3:02 PM

## 2012-02-02 NOTE — ED Notes (Signed)
MD Delford Field with critical care reviewed EKG and is not concerned for Cardiac event.  MD says he will watch patient overnight,

## 2012-02-02 NOTE — ED Provider Notes (Signed)
History     CSN: [id number]  Arrival date & time 02/02/12  1144   First MD Initiated Contact with Patient 02/02/12 1149      Chief Complaint  Patient presents with  . Code Sepsis    The history is provided by the EMS personnel, the nursing home and a relative. The history is limited by the condition of the patient (altered mental staus).  71 year old female was transferred from a nursing home because of fever and low blood pressure. She was reported to have a temperature of 103 at the nursing home. She has multiple sclerosis and has a chronic indwelling catheter. She recently had surgery for a fractured left femur. Her daughter who is here states that she last talked with her 2 days ago at which point she was her normal self. Today, she is almost unresponsive.  (Consider location/radiation/quality/duration/timing/severity/associated sxs/prior treatment)  Past Medical History  Diagnosis Date  . Multiple sclerosis   . GERD (gastroesophageal reflux disease)   . Depression   . Recurrent UTI   . Recurrent pneumonia   . Chronic constipation   . Diverticulosis     Past Surgical History  Procedure Date  . Cholecystectomy   . Orif humeral shaft fracture 01/2011  . Video assisted thoracoscopy 06/2010  . Closed reduction shoulder dislocation 04/2010    Fracture dislocation  . Femur im nail 01/13/2012    Procedure: INTRAMEDULLARY (IM) RETROGRADE FEMORAL NAILING;  Surgeon: Thera Flake., MD;  Location: [facility] OR;  Service: Orthopedics;  Laterality: Left;  . Femoral artery exploration 01/13/2012    Procedure: FEMORAL ARTERY EXPLORATION;  Surgeon: Pryor Ochoa, MD;  Location: [facility] OR;  Service: Vascular;  Laterality: Left;    History reviewed. No pertinent family history.  History  Substance Use Topics  . Smoking status: Never Smoker   . Smokeless tobacco: Not on file  . Alcohol Use: No    OB History    Grav Para Term Preterm Abortions TAB SAB Ect Mult Living                  Review  of Systems  Unable to perform ROS: Mental status change    Allergies  Review of patient's allergies indicates no known allergies.  Home Medications   Current Outpatient Rx  Name Route Sig Dispense Refill  . AMITRIPTYLINE HCL 50 MG PO TABS Oral Take 100 mg by mouth at bedtime.      . ASPIRIN 325 MG PO TABS Oral Take 1 tablet (325 mg total) by mouth 2 (two) times daily.    Marland Kitchen DALFAMPRIDINE ER 10 MG PO TB12 Oral Take 10 mg by mouth 2 (two) times daily.      Marland Kitchen DOCUSATE SODIUM 100 MG PO CAPS Oral Take 200 mg by mouth 2 (two) times daily.      Marland Kitchen FLUOXETINE HCL 40 MG PO CAPS Oral Take 40 mg by mouth daily.      Marland Kitchen GABAPENTIN 300 MG PO CAPS Oral Take 300-600 mg by mouth 3 (three) times daily. Take two capsules at lunch and one tablet at dinner and bedtime    . THERA M PLUS PO TABS Oral Take 1 tablet by mouth daily.      Marland Kitchen OMEPRAZOLE 20 MG PO CPDR Oral Take 20 mg by mouth 2 (two) times daily.      Marland Kitchen VITAMIN C 500 MG PO TABS Oral Take 500 mg by mouth 2 (two) times daily.  BP 70/49  Pulse 109  Temp(Src) 98.3 F (36.8 C) (Oral)  Resp 21  SpO2 97%  Physical Exam  Nursing note and vitals reviewed.  71 year old female who is tachypneic and poorly responsive and generally appear septic. Vital signs are significant for tachycardia with heart rate of 109, tachypnea with respiratory rate of 21, and hypertension with blood pressure 70/49. Oxygen saturation is 97% which is normal. Head is normocephalic and atraumatic. PE PERRLA. She cannot cooperate for EOM exam. Mucous membranes are dry. Neck is supple and nontender. Back is nontender. Lungs are clear without rales, wheezes, or rhonchi. Heart has regular rate and rhythm without murmur. Abdomen is soft, flat, nontender without masses or hepatosplenomegaly. She has an indwelling Foley catheter in place. Extremities: There is surgical scar in the left leg which is healing well. Her left lower leg is in a brace. She has no cyanosis or edema. Neurologic:  She is awake and poorly responsive to verbal stimuli. She will is oriented to person but I cannot get her to respond to questions about place or time. There no gross motor or sensory deficits.  ED Course  CENTRAL LINE Date/Time: 02/02/2012 12:30 PM Performed by: Dione Booze Authorized by: Preston Fleeting, Kaytie Ratcliffe Consent: Verbal consent not obtained. Written consent not obtained. The procedure was performed in an emergent situation. Consent given by: power of attorney Required items: required blood products, implants, devices, and special equipment available Patient identity confirmed: arm band and hospital-assigned identification number Indications: vascular access Anesthesia: local infiltration Local anesthetic: lidocaine 1% without epinephrine Patient sedated: no Preparation: skin prepped with ChloraPrep Skin prep agent dried: skin prep agent completely dried prior to procedure Sterile barriers: all five maximum sterile barriers used - cap, mask, sterile gown, sterile gloves, and large sterile sheet Hand hygiene: hand hygiene performed prior to central venous catheter insertion Location details: left femoral Site selection rationale: Initially, attempts were made for the right femoral since she had recent surgery on the left, but I was unable to cannulate the right femoral vein so the left femoral vein was chosen. This preserves subclavian and internal jugular access for critical care if they desire a catheter for pressure monitoring. Patient position: flat Catheter type: triple lumen Pre-procedure: landmarks identified Ultrasound guidance: no Number of attempts: 2 Successful placement: yes Post-procedure: line sutured and dressing applied Assessment: blood return through all parts and free fluid flow Patient tolerance: Patient tolerated the procedure well with no immediate complications.   (including critical care time)  Results for orders placed during the hospital encounter of 02/02/12    CBC      Component Value Range   WBC 12.0 (*) 4.0 - 10.5 (K/uL)   RBC 4.76  3.87 - 5.11 (MIL/uL)   Hemoglobin 12.3  12.0 - 15.0 (g/dL)   HCT 16.1  09.6 - 04.5 (%)   MCV 78.8  78.0 - 100.0 (fL)   MCH 25.8 (*) 26.0 - 34.0 (pg)   MCHC 32.8  30.0 - 36.0 (g/dL)   RDW 40.9 (*) 81.1 - 15.5 (%)   Platelets 383  150 - 400 (K/uL)  DIFFERENTIAL      Component Value Range   Neutrophils Relative 73  43 - 77 (%)   Lymphocytes Relative 11 (*) 12 - 46 (%)   Monocytes Relative 16 (*) 3 - 12 (%)   Eosinophils Relative 0  0 - 5 (%)   Basophils Relative 0  0 - 1 (%)   Neutro Abs 8.8 (*) 1.7 - 7.7 (K/uL)  Lymphs Abs 1.3  0.7 - 4.0 (K/uL)   Monocytes Absolute 1.9 (*) 0.1 - 1.0 (K/uL)   Eosinophils Absolute 0.0  0.0 - 0.7 (K/uL)   Basophils Absolute 0.0  0.0 - 0.1 (K/uL)   WBC Morphology TOXIC GRANULATION    COMPREHENSIVE METABOLIC PANEL      Component Value Range   Sodium 137  135 - 145 (mEq/L)   Potassium 6.2 (*) 3.5 - 5.1 (mEq/L)   Chloride 103  96 - 112 (mEq/L)   CO2 21  19 - 32 (mEq/L)   Glucose, Bld 102 (*) 70 - 99 (mg/dL)   BUN 51 (*) 6 - 23 (mg/dL)   Creatinine, Ser 1.61 (*) 0.50 - 1.10 (mg/dL)   Calcium 09.6  8.4 - 10.5 (mg/dL)   Total Protein 6.5  6.0 - 8.3 (g/dL)   Albumin 2.7 (*) 3.5 - 5.2 (g/dL)   AST 44 (*) 0 - 37 (U/L)   ALT 38 (*) 0 - 35 (U/L)   Alkaline Phosphatase 388 (*) 39 - 117 (U/L)   Total Bilirubin 1.2  0.3 - 1.2 (mg/dL)   GFR calc non Af Amer 21 (*) >90 (mL/min)   GFR calc Af Amer 24 (*) >90 (mL/min)  LACTIC ACID, PLASMA      Component Value Range   Lactic Acid, Venous 3.3 (*) 0.5 - 2.2 (mmol/L)  PROCALCITONIN      Component Value Range   Procalcitonin 27.83    URINALYSIS, ROUTINE W REFLEX MICROSCOPIC      Component Value Range   Color, Urine AMBER (*) YELLOW    APPearance TURBID (*) CLEAR    Specific Gravity, Urine 1.021  1.005 - 1.030    pH 8.5 (*) 5.0 - 8.0    Glucose, UA 100 (*) NEGATIVE (mg/dL)   Hgb urine dipstick SMALL (*) NEGATIVE    Bilirubin  Urine NEGATIVE  NEGATIVE    Ketones, ur NEGATIVE  NEGATIVE (mg/dL)   Protein, ur >045 (*) NEGATIVE (mg/dL)   Urobilinogen, UA 1.0  0.0 - 1.0 (mg/dL)   Nitrite POSITIVE (*) NEGATIVE    Leukocytes, UA LARGE (*) NEGATIVE   URINE MICROSCOPIC-ADD ON      Component Value Range   Crystals TRIPLE PHOSPHATE CRYSTALS (*) NEGATIVE    Urine-Other AMORPHOUS URATES/PHOSPHATES     Dg Femur Left  01/13/2012  *RADIOLOGY REPORT*  Clinical Data: Intraoperative ORIF left femoral fracture  LEFT FEMUR - 2 VIEW  Comparison: 01/12/2012  Findings: Four intraoperative fluoroscopic images demonstrate a left femoral intramedullary rod fixation of the previously seen metadiaphyseal fracture.  There remains posterior angulation of the fracture fragments.  No evidence for hardware failure.  No new fracture. On presumed frontal projection, there is one half shaft width overlap of fracture fragments.  IMPRESSION: Intraoperative fixation of left femoral fracture as above.  Original Report Authenticated By: Harrel Lemon, M.D.   Dg Pelvis Portable  01/12/2012  *RADIOLOGY REPORT*  Clinical Data: Distal left femur fracture.  PORTABLE PELVIS  Comparison: None.  Findings: 1350 hours.  The left inferior pubic rami and left femoral neck are partly obscured by the left thigh immobilization device.  There is no evidence of acute pelvic fracture or dislocation.  Mild degenerative changes are present at both hips. Left pelvic calcification may be vascular or reflect a degenerated fibroid.  IMPRESSION: No acute osseous findings demonstrated at the pelvis.  Of note, portions of the left femoral neck and left pubic rami are obscured by the left thigh immobilization device.  Original Report Authenticated By: Gerrianne Scale, M.D.   Dg Chest Portable 1 View  02/02/2012  *RADIOLOGY REPORT*  Clinical Data: Sepsis and unresponsive.  PORTABLE CHEST - 1 VIEW  Comparison: 01/12/2012  Findings: Lung volumes are very low with significant atelectasis  present.  It would be difficult to exclude subtle infiltrate given the compressed lungs.  There is no overt pulmonary edema.  There may be a tiny amount of left pleural fluid.  The heart size is within normal limits.  IMPRESSION: Extremely low lung volumes with bilateral atelectasis.  Potential tiny left pleural effusion.  Original Report Authenticated By: Reola Calkins, M.D.   Dg Chest Portable 1 View  01/12/2012  *RADIOLOGY REPORT*  Clinical Data: Status post fall.  Possible femur fracture.  History of chronic pneumonia and multiple sclerosis.  PORTABLE CHEST - 1 VIEW  Comparison: None.  Findings: 1400 hours.  There is mild patient rotation to the left. Heart size and mediastinal contours are normal.  There is mild volume loss in the left hemithorax with patchy left basilar pulmonary opacity.  The right lung is clear.  No pleural effusion, pneumothorax or acute osseous abnormality is identified.  IMPRESSION: Patchy left basilar pulmonary opacity is associated with volume loss and could reflect chronic scarring or atelectasis.  Without prior examinations, pneumonia cannot be excluded.  Follow-up recommended.  Original Report Authenticated By: Gerrianne Scale, M.D.   Dg Femur Left Port  01/13/2012  *RADIOLOGY REPORT*  Clinical Data: Status post fracture fixation.  PORTABLE LEFT FEMUR - 2 VIEW  Comparison: Plain films 01/12/2012.  Findings: The patient has a new IM nail with three distal interlocking screws traversing a diaphyseal fracture through the junction of the middle and distal thirds of the left femur.  There is mild medial and posterior displacement.  Gas in the soft tissues and surgical staples noted.  IMPRESSION: Status post IM nailing of a distal femur fracture as described.  Original Report Authenticated By: Bernadene Bell. D'ALESSIO, M.D.   Dg Femur Left Port  01/12/2012  *RADIOLOGY REPORT*  Clinical Data: Fall, left femur pain  PORTABLE LEFT FEMUR - 2 VIEW  Comparison: None.  Findings: There  is a spiral type fracture of the distal left femoral metadiaphysis, with one shaft width overlap of the fracture fragments.  Posterior displacement of the distal fracture fragment is noted.  No radiopaque foreign body.  Overlying soft tissue swelling is evident.  IMPRESSION: Spiral type fracture of the distal left femoral metadiaphysis.  Original Report Authenticated By: Harrel Lemon, M.D.   Dg C-arm 61-120 Min  01/22/2012  C-ARM 60-120 MINUTES  Fluoroscopy was utilized by the requesting physician. No radiograph ic interpretation.  Original Report Authenticated By: Sharyn Lull    Date: 02/02/2012  Rate: 106  Rhythm: sinus tachycardia  QRS Axis: right  Intervals: normal  ST/T Wave abnormalities: normal  Conduction Disutrbances:none  Narrative Interpretation: Right axis deviation, otherwise normal ECG. No old ECG available for comparison.  Old EKG Reviewed: none available  Patient presented in clinical septic shock. She required a femoral line to get adequate volume resuscitation. She was given orally, goal-directed fluid therapy as well as antibiotics of vancomycin and Zosyn after cultures were obtained. Her blood pressure has not responded appropriately to goal-directed fluid replacement, so pressors are started and she started on Levothroid drip. I have contacted Dr. Delford Field of a pulmonary critical care and Dr. Joelene Millin has come to take over the patient's care. Patient's son has arrived and I have discussed  the situation with him as well as the patient's daughter who had been here earlier. She will be admitted to ICU in critical condition. She was also noted to be hyperkalemic, and was given calcium, sodium bicarbonate, glucose, insulin intravenously for hyperkalemia as well as rectal Kayexalate.   1. Sepsis   2. Urinary tract infection   3. Renal insufficiency   4. Hyperkalemia    CRITICAL CARE Performed by: WUJWJ,XBJYN   Total critical care time: 120 minutes  Critical care time was  exclusive of separately billable procedures and treating other patients.  Critical care was necessary to treat or prevent imminent or life-threatening deterioration.  Critical care was time spent personally by me on the following activities: development of treatment plan with patient and/or surrogate as well as nursing, discussions with consultants, evaluation of patient's response to treatment, examination of patient, obtaining history from patient or surrogate, ordering and performing treatments and interventions, ordering and review of laboratory studies, ordering and review of radiographic studies, pulse oximetry and re-evaluation of patient's condition.    MDM  Sepsis with most likely etiology being urosepsis since he has an indwelling Foley catheter. She has had a history of pneumonia and that is also a possibility of. She is started on the code sepsis protocol with goal-directed fluid therapy and antibiotics. She has relatively poor peripheral veins, so a central line will be started. Implications of the code sepsis has been discussed with her family member. Family member states that the patient is DO NOT RESUSCITATE and does not wish to be on life support machines.        Dione Booze, MD 02/02/12 1420

## 2012-02-02 NOTE — Progress Notes (Addendum)
eLink Physician-Brief Progress Note Patient Name: [name] [name] DOB: Oct 14, 1941 MRN: [id number]  Date of Service  02/02/2012   HPI/Events of Note   Nursing calling with results of EKG, question of inferior ST elevation.  eICU Interventions  Aspirin 324 x 1 Troponin, CPK, repeat EKG   Intervention Category Major Interventions: Arrhythmia - evaluation and management;Other:  Sebron Mcmahill 02/02/2012, 3:58 PM

## 2012-02-02 NOTE — ED Notes (Signed)
MD Preston Fleeting starting central line placement - femoral right

## 2012-02-02 NOTE — ED Notes (Signed)
Calcium not given as this is not typical medication given during this situation and asked Pharm to clarify this medication with MD Preston Fleeting.  Pt levophed increased to 71mcg/min as map was 54.

## 2012-02-02 NOTE — ED Notes (Signed)
Repeat EKG done and critical care and EDP made aware.

## 2012-02-02 NOTE — ED Notes (Signed)
RUE:[id number]<JW> Expected date:<BR> Expected time:11:36 AM<BR> Means of arrival:<BR> Comments:<BR> M32 - 70yoF Fever

## 2012-02-02 NOTE — ED Notes (Signed)
Pt here from nursing facility with fever, lethargy and altered level of consciousness.

## 2012-02-03 ENCOUNTER — Inpatient Hospital Stay (HOSPITAL_COMMUNITY): Payer: Medicare (Managed Care)

## 2012-02-03 DIAGNOSIS — A419 Sepsis, unspecified organism: Secondary | ICD-10-CM

## 2012-02-03 DIAGNOSIS — R6521 Severe sepsis with septic shock: Secondary | ICD-10-CM

## 2012-02-03 DIAGNOSIS — G35 Multiple sclerosis: Secondary | ICD-10-CM

## 2012-02-03 DIAGNOSIS — N3000 Acute cystitis without hematuria: Secondary | ICD-10-CM

## 2012-02-03 LAB — CBC
HCT: 31.7 % — ABNORMAL LOW (ref 36.0–46.0)
Hemoglobin: 10.2 g/dL — ABNORMAL LOW (ref 12.0–15.0)
MCV: 79.3 fL (ref 78.0–100.0)
Platelets: 294 10*3/uL (ref 150–400)
RBC: 4 MIL/uL (ref 3.87–5.11)
WBC: 9.1 10*3/uL (ref 4.0–10.5)

## 2012-02-03 LAB — BASIC METABOLIC PANEL WITH GFR
BUN: 40 mg/dL — ABNORMAL HIGH (ref 6–23)
CO2: 20 meq/L (ref 19–32)
Calcium: 9.7 mg/dL (ref 8.4–10.5)
Chloride: 111 meq/L (ref 96–112)
Creatinine, Ser: 1.41 mg/dL — ABNORMAL HIGH (ref 0.50–1.10)
GFR calc Af Amer: 43 mL/min — ABNORMAL LOW
GFR calc non Af Amer: 37 mL/min — ABNORMAL LOW
Glucose, Bld: 108 mg/dL — ABNORMAL HIGH (ref 70–99)
Potassium: 4.4 meq/L (ref 3.5–5.1)
Sodium: 141 meq/L (ref 135–145)

## 2012-02-03 LAB — PHOSPHORUS: Phosphorus: 3.9 mg/dL (ref 2.3–4.6)

## 2012-02-03 LAB — MAGNESIUM: Magnesium: 1.7 mg/dL (ref 1.5–2.5)

## 2012-02-03 MED ORDER — SODIUM CHLORIDE 0.9 % IV SOLN
1.0000 g | Freq: Two times a day (BID) | INTRAVENOUS | Status: DC
  Administered 2012-02-03 – 2012-02-04 (×2): 1 g via INTRAVENOUS
  Filled 2012-02-03 (×3): qty 1

## 2012-02-03 MED ORDER — ACETAMINOPHEN 650 MG RE SUPP
650.0000 mg | Freq: Four times a day (QID) | RECTAL | Status: DC | PRN
  Administered 2012-02-03 – 2012-02-07 (×6): 650 mg via RECTAL
  Filled 2012-02-03 (×8): qty 1

## 2012-02-03 MED ORDER — VANCOMYCIN HCL IN DEXTROSE 1-5 GM/200ML-% IV SOLN
1000.0000 mg | INTRAVENOUS | Status: DC
  Administered 2012-02-03: 1000 mg via INTRAVENOUS
  Filled 2012-02-03 (×2): qty 200

## 2012-02-03 MED ORDER — VANCOMYCIN HCL IN DEXTROSE 1-5 GM/200ML-% IV SOLN: 1000.0000 mg | Freq: Once | INTRAVENOUS | Status: DC

## 2012-02-03 NOTE — Progress Notes (Signed)
CRITICAL VALUE ALERT  Critical value received: positive blood cultures x 2 sets  Date of notification: 02/03/12  Time of notification:  1530  Critical value read back:yes  Nurse who received alert:  Lorrin Jackson RN  MD notified (1st page):  Dr. Patricia Pesa  Time of first page:  1535  MD notified (2nd page):n/a  Time of second page:n/a  Responding MD:  Dr. Patricia Pesa Time MD responded: [phone]

## 2012-02-03 NOTE — Progress Notes (Signed)
Name: [name] [name] MRN: [id number] DOB: 1941-03-22    LOS: 1 PCP -  [name], [facility]   PCCM FU NOTE  History of Present Illness: 71 y.o. Female, NHR with a PMHx of multiple sclerosis, chronic indwelling  foley catheter for approximately the last 6 months, presents 2/9 with a sepsis syndrome - fever 102.7, high lactate, low BP- not responding to 3 L fluids in ER. Foley was changed - UA pos, given zosyn, vanc, BP improved to 90's ,femoral CVL placed by EDP. PCCM asked to assume care. DNR noted & discussed At baseline, she is wheelchair bound, interactive. Lt femoral fracture s/p ORIF in 1/13  Lines / Drains: Rt fem CVL  2/9 >>  Cultures: 2/9 urine >> 2/9 bld >>  Antibiotics: Zosyn 2/9 >> vanc 2/9>>  Tests / Events:   More awake, interactive, denies pain  Vital Signs: Temp:  [98.3 F (36.8 C)-102.9 F (39.4 C)] 102.6 F (39.2 C) (02/10 0800) Pulse Rate:  [105-125] 124  (02/10 0800) Resp:  [18-28] 25  (02/10 0800) BP: (70-116)/(30-70) 92/64 mmHg (02/10 0800) SpO2:  [90 %-100 %] 100 % (02/10 0800) FiO2 (%):  [100 %] 100 % (02/10 0800) Weight:  [78.7 kg (173 lb 8 oz)] 78.7 kg (173 lb 8 oz) (02/09 1530) I/O last 3 completed shifts: In: 2733.3 [I.V.:2026.3; IV Piggyback:707] Out: 1600 [Urine:1600]  Physical Examination: General:  Chronically ill appearing, woman , in mild  Distress, on NRB Neuro:  Follows 1 step commands, unable to move left side -chronic per daughter   HEENT:  Dry mucosa, no pallor, citerus Neck:  No jvd   Cardiovascular:  s1s2 tachy Lungs:  Crackles lt, clear rt Abdomen:  Soft, non tender, sediment in foley Musculoskeletal:  Lt LE in boot Skin:  Dry turgor, no rash    Labs and Imaging:  Reviewed.   CBC    Component Value Date/Time   WBC 9.1 02/03/2012 0500   RBC 4.00 02/03/2012 0500   HGB 10.2* 02/03/2012 0500   HCT 31.7* 02/03/2012 0500   PLT 294 02/03/2012 0500   MCV 79.3 02/03/2012 0500   MCH 25.5* 02/03/2012 0500   MCHC 32.2 02/03/2012 0500   RDW  20.4* 02/03/2012 0500   LYMPHSABS 1.3 02/02/2012 1240   MONOABS 1.9* 02/02/2012 1240   EOSABS 0.0 02/02/2012 1240   BASOSABS 0.0 02/02/2012 1240    BMET    Component Value Date/Time   NA 141 02/03/2012 0500   K 4.4 02/03/2012 0500   CL 111 02/03/2012 0500   CO2 20 02/03/2012 0500   GLUCOSE 108* 02/03/2012 0500   BUN 40* 02/03/2012 0500   CREATININE 1.41* 02/03/2012 0500   CALCIUM 9.7 02/03/2012 0500   GFRNONAA 37* 02/03/2012 0500   GFRAA 43* 02/03/2012 0500    pCXR - low volumes, bibasla atx & effusions  Assessment and Plan: Septic shock due to UTI  - Treat for nosocomial organism - received zosyn , will use IMipenem to cover ESBL organisms Discussed advance directives with daughter - they are ok with pressors but no machines - no CPR/ cardioversion or intubation -Mild hypoxia , concern for fluid overload , will drop fluids to 125/h -use levophed to goal MAP 65 & above -doubt PE here , note neg duplex BLEs 1/25, rpt duplex BLEs to r/o DVT  Acute renal failure - improving with fluids  Hyperkalemia - improved  High alkphos/ mild elevated LFTs  - unclear etio, s/p cholecystectomy, follow  Multiple sclerosis - avoid sedating meds - but  if she appears in resp distress inspite of oxygen, we will have to change gears & use low dose morphine for comfort - have d/w family   Best practices / Disposition: -->ICU status under PCCM -->DNR except pressors -->Heparin for DVT Px -->family updated at bedside  The patient is critically ill with multiple organ systems failure and requires high complexity decision making for assessment and support, frequent evaluation and titration of therapies, application of advanced monitoring technologies and extensive interpretation of multiple databases. CC time x  Cayle Thunder V. 02/03/2012, 9:47 AM

## 2012-02-03 NOTE — Progress Notes (Signed)
ANTIBIOTIC CONSULT NOTE - INITIAL  Pharmacy Consult for Meropenem Indication: urosepsis with possibility of +ESBL  No Known Allergies  Patient Measurements: Height: 5\' 2"  (157.5 cm) Weight: 173 lb 8 oz (78.7 kg) IBW/kg (Calculated) : 50.1  Weight 93 kg, Height 64 in, Adjusted Weight ~66kg  Vital Signs: Temp: 102.7 F (39.3 C) (02/10 1000) BP: 114/52 mmHg (02/10 1000) Pulse Rate: 125  (02/10 1000) Intake/Output from previous day: 02/09 0701 - 02/10 0700 In: 2733.3 [I.V.:2026.3; IV Piggyback:707] Out: 1600 [Urine:1600] Intake/Output from this shift: Total I/O In: 375 [I.V.:375] Out: 275 [Urine:275]  Labs:  Basename 02/03/12 0500 02/02/12 1757 02/02/12 1240  WBC 9.1 -- 12.0*  HGB 10.2* -- 12.3  PLT 294 -- 383  LABCREA -- -- --  CREATININE 1.41* 1.87* 2.28*   Estimated Creatinine Clearance: 36 ml/min (by C-G formula based on Cr of 1.41). No results found for this basename: VANCOTROUGH:2,VANCOPEAK:2,VANCORANDOM:2,GENTTROUGH:2,GENTPEAK:2,GENTRANDOM:2,TOBRATROUGH:2,TOBRAPEAK:2,TOBRARND:2,AMIKACINPEAK:2,AMIKACINTROU:2,AMIKACIN:2, in the last 72 hours   Microbiology: Recent Results (from the past 720 hour(s))  URINE CULTURE     Status: Normal   Collection Time   01/12/12  2:35 PM      Component Value Range Status Comment   Specimen Description URINE, CLEAN CATCH   Final    Special Requests NONE   Final    Culture  Setup Time [id number]   Final    Colony Count >=100,000 COLONIES/ML   Final    Culture     Final    Value: Multiple bacterial morphotypes present, none predominant. Suggest appropriate recollection if clinically indicated.   Report Status 01/13/2012 FINAL   Final   SURGICAL PCR SCREEN     Status: Normal   Collection Time   01/13/12  1:24 AM      Component Value Range Status Comment   MRSA, PCR NEGATIVE  NEGATIVE  Final    Staphylococcus aureus NEGATIVE  NEGATIVE  Final   MRSA PCR SCREENING     Status: Normal   Collection Time   01/13/12  1:31 PM   Component Value Range Status Comment   MRSA by PCR NEGATIVE  NEGATIVE  Final   CULTURE, BLOOD (ROUTINE X 2)     Status: Normal (Preliminary result)   Collection Time   02/02/12 11:55 AM      Component Value Range Status Comment   Specimen Description BLOOD LEFT WRIST  5 ML IN [facility] BOTTLE   Final    Special Requests NONE   Final    Culture  Setup Time [id number]   Final    Culture     Final    Value:        BLOOD CULTURE RECEIVED NO GROWTH TO DATE CULTURE WILL BE HELD FOR 5 DAYS BEFORE ISSUING A FINAL NEGATIVE REPORT   Report Status PENDING   Incomplete   CULTURE, BLOOD (ROUTINE X 2)     Status: Normal (Preliminary result)   Collection Time   02/02/12 12:40 PM      Component Value Range Status Comment   Specimen Description BLOOD LEFT FEMORAL  5 ML IN [facility] BOTTLE   Final    Special Requests NONE   Final    Culture  Setup Time [id number]   Final    Culture     Final    Value:        BLOOD CULTURE RECEIVED NO GROWTH TO DATE CULTURE WILL BE HELD FOR 5 DAYS BEFORE ISSUING A FINAL NEGATIVE REPORT   Report Status PENDING   Incomplete  MRSA PCR SCREENING     Status: Normal   Collection Time   02/02/12  4:00 PM      Component Value Range Status Comment   MRSA by PCR NEGATIVE  NEGATIVE  Final     Medical History: Past Medical History  Diagnosis Date  . Multiple sclerosis   . GERD (gastroesophageal reflux disease)   . Depression   . Recurrent UTI   . Recurrent pneumonia   . Chronic constipation   . Diverticulosis     Medications:  Scheduled:     . sodium chloride   Intravenous Once  . aspirin  324 mg Oral STAT  . calcium chloride 700 mg IVPB  700 mg Intravenous Once  . dextrose  50 mL Intravenous Once  . heparin  5,000 Units Subcutaneous Q8H  . insulin aspart  10 Units Intravenous Once  . meropenem (MERREM) IV  500 mg Intravenous To ER  . meropenem (MERREM) IV  500 mg Intravenous Q12H  . norepinephrine (LEVOPHED) Adult infusion  2-50 mcg/min Intravenous Once  .  piperacillin-tazobactam  3.375 g Intravenous Once  . sodium bicarbonate  25 mEq Intravenous Once  . sodium chloride  1,000 mL Intravenous Once  . sodium chloride  500 mL Intravenous Once  . sodium polystyrene  30 g Rectal Once  . vancomycin  1,000 mg Intravenous Once  . DISCONTD: calcium chloride 350 mg IVPB  350 mg Intravenous Once  . DISCONTD: calcium chloride  1 g Intravenous Once  . DISCONTD: dextrose  25 mL Intravenous Once  . DISCONTD: insulin regular  10 Units Subcutaneous Once  . DISCONTD: piperacillin-tazobactam  3.375 g Intravenous Q8H  . DISCONTD: piperacillin-tazobactam (ZOSYN)  IV  3.375 g Intravenous Q8H   Assessment: Day 2 Meropenem for UTI awaiting cx results. Improved renal function. Tmax 102.7 x 3. Improved WBC  Goal of Therapy:  adjustment per renal function  Plan:  Change Meropenem to 1g IV q12 for CrCl 26-50 ml/min. Will continue to monitor renal function for any other changes in dose.    Hessie Knows, PharmD, BCPS 02/03/2012 10:24 AM pager [phone]

## 2012-02-03 NOTE — Progress Notes (Signed)
eLink Physician-Brief Progress Note Patient Name: [name] [name] DOB: 1941/06/29 MRN: [id number]  Date of Service  02/03/2012   HPI/Events of Note  Temp greater than 102 F   eICU Interventions  Order for tylenol 650 mg rectally q6 hours prn   Intervention Category Minor Interventions: Routine modifications to care plan (e.g. PRN medications for pain, fever)  DETERDING,ELIZABETH 02/03/2012, 12:47 AM

## 2012-02-04 ENCOUNTER — Inpatient Hospital Stay (HOSPITAL_COMMUNITY): Payer: Medicare (Managed Care)

## 2012-02-04 LAB — CBC
HCT: 25.4 % — ABNORMAL LOW (ref 36.0–46.0)
Hemoglobin: 8.1 g/dL — ABNORMAL LOW (ref 12.0–15.0)
MCHC: 31.9 g/dL (ref 30.0–36.0)
MCV: 78.8 fL (ref 78.0–100.0)
Platelets: 273 10*3/uL (ref 150–400)
RBC: 2.26 MIL/uL — ABNORMAL LOW (ref 3.87–5.11)
RBC: 3.21 MIL/uL — ABNORMAL LOW (ref 3.87–5.11)
WBC: 5.8 10*3/uL (ref 4.0–10.5)
WBC: 4.8 10*3/uL (ref 4.0–10.5)

## 2012-02-04 LAB — BASIC METABOLIC PANEL
CO2: 19 mEq/L (ref 19–32)
Calcium: 9.4 mg/dL (ref 8.4–10.5)
GFR calc Af Amer: >90 mL/min (ref >90)
Sodium: 141 mEq/L (ref 135–145)

## 2012-02-04 MED ORDER — SODIUM CHLORIDE 0.9 % IV SOLN
1.0000 g | Freq: Three times a day (TID) | INTRAVENOUS | Status: DC
  Administered 2012-02-04 – 2012-02-05 (×4): 1 g via INTRAVENOUS
  Filled 2012-02-04 (×6): qty 1

## 2012-02-04 MED ORDER — FENTANYL CITRATE 0.05 MG/ML IJ SOLN
12.5000 ug | INTRAMUSCULAR | Status: DC | PRN
  Administered 2012-02-04 (×4): 12.5 ug via INTRAVENOUS
  Filled 2012-02-04 (×4): qty 2

## 2012-02-04 MED ORDER — VANCOMYCIN HCL IN DEXTROSE 1-5 GM/200ML-% IV SOLN
1000.0000 mg | Freq: Two times a day (BID) | INTRAVENOUS | Status: DC
  Administered 2012-02-04: 1000 mg via INTRAVENOUS
  Filled 2012-02-04 (×3): qty 200

## 2012-02-04 NOTE — Progress Notes (Signed)
Name: [name] [name] MRN: [id number] DOB: 04-22-1941    LOS: 2 PCP -  [name], PACE   PCCM PROGRESS  NOTE  History of Present Illness: 20 yowf NHR with a PMHx of multiple sclerosis, chronic indwelling  foley catheter for approximately the last 6 months, presents 2/9 with a sepsis syndrome - fever 102.7, high lactate, low BP- not responding to 3 L fluids in ER. Foley was changed - UA pos, given zosyn, vanc, BP improved to 90's ,femoral CVL placed by EDP. PCCM asked to assume care. DNR noted & discussed At baseline, she is wheelchair bound, interactive. Lt femoral fracture s/p ORIF in 1/13   Lines / Drains: Rt fem CVL  2/9 >>  Cultures: 2/9 urine >  POS 100 k  gnr>>> 2/9 bld >>2/2 coag neg staph    Antibiotics: Zosyn 2/9  (HCAUTI)>> 2/9 vanc 2/9> 2/11 Primaxin (HCAUTI) 2/9 >>>  Tests / Events:   More awake, interactive, denies pain  Vital Signs: Temp:  [98.1 F (36.7 C)-102.7 F (39.3 C)] 99.1 F (37.3 C) (02/11 0900) Pulse Rate:  [25-128] 115  (02/11 0900) Resp:  [18-33] 20  (02/11 0900) BP: (82-127)/(29-83) 123/61 mmHg (02/11 0900) SpO2:  [90 %-100 %] 92 % (02/11 0900) FiO2 (%):  [40 %] 40 % (02/10 1030) I/O last 3 completed shifts: In: 5803.4 [I.V.:4903.4; IV Piggyback:900] Out: 2925 [Urine:2925]  Physical Examination: General:  Chronically ill appearing, woman , NAD Neuro:  Follows 1 step commands, unable to move left side -chronic per daughter   HEENT:  Dry mucosa, no pallor, citerus Neck:  No jvd   Cardiovascular:  s1s2 tachy Lungs: decreased bases Abdomen:  Soft, non tender, sediment in foley Musculoskeletal:  Lt LE in boot Skin:  Dry turgor, no rash    Labs and Imaging:  Reviewed.    Lab 02/04/12 0530 02/03/12 0500 02/02/12 1757  NA 141 141 140  K 4.0 4.4 4.9  CL 115* 111 109  CO2 19 20 20   BUN 38* 40* 48*  CREATININE 0.74 1.41* 1.87*  GLUCOSE 83 108* 122*    Lab 02/04/12 0705 02/04/12 0530 02/03/12 0500  HGB 8.1* 5.8* 10.2*  HCT 25.4* 17.8*  31.7*  WBC 4.8 5.8 9.1  PLT 241 273 294    Dg Chest Port 1 View  02/04/2012   .  PORTABLE CHEST - 1 VIEW  Comparison: 02/03/2012  Findings: Cardiac size is within normal limits.  The patient is rotated towards the left.  Shallow inflation.  Patchy bilateral infiltrates appears stable.  IMPRESSION: Persistent bilateral infiltrates        Assessment and Plan: Septic shock due to UTI  - Treat for nosocomial organism - received zosyn 2/9 changed  IMipenem to cover ESBL organisms. 2/2 bc with staph coag neg >  DC vanc 2/11  Discussed advance directives with daughter - they are ok with pressors but no machines - no CPR/ cardioversion or intubation -Mild hypoxia , concern for fluid overload , will drop fluids to 125/h  -doubt PE here , note neg duplex BLEs 1/25, rpt duplex BLEs to r/o DVT -dc fem cvl in another  24 hrs if no pressors needed . Acute renal failure - improving with fluids, resolved 2/11 Lab Results  Component Value Date   CREATININE 0.74 02/04/2012   CREATININE 1.41* 02/03/2012   CREATININE 1.87* 02/02/2012    Hyperkalemia - improved  High alkphos/ mild elevated LFTs  - unclear etio, s/p cholecystectomy, follow  Multiple sclerosis - avoid sedating meds -  but if she appears in resp distress inspite of oxygen, we plan to change gears & use low dose morphine for comfort    Best practices / Disposition: -->ICU status under PCCM -->DNR except pressors -->Heparin for DVT Px -->family updated at bedside  [facility] Minor ACNP Adolph Pollack PCCM Pager [phone] till 3 pm If no answer page [phone] 02/04/2012, 10:28 AM   Pt independently  seen and examined and available cxr's reviewed and I agree with above findings/ imp/ plan  Sandrea Hughs, MD Pulmonary and Critical Care Medicine [facility] Cell [phone]

## 2012-02-04 NOTE — Progress Notes (Signed)
eLink Physician-Brief Progress Note Patient Name: [name] [name] DOB: 1941-01-16 MRN: [id number]  Date of Service  02/04/2012   HPI/Events of Note   Cbc noted Error? Some error on chem ? Off pressors just now No blood  eICU Interventions  Stat repeat    Intervention Category Intermediate Interventions: Diagnostic test evaluation  FEINSTEIN,DANIEL J. 02/04/2012, 7:00 AM

## 2012-02-04 NOTE — Progress Notes (Addendum)
ANTIBIOTIC CONSULT NOTE - INITIAL  Pharmacy Consult for Meropenem Indication: urosepsis with possibility of +ESBL  No Known Allergies  Patient Measurements: Height: 5\' 2"  (157.5 cm) Weight: 173 lb 8 oz (78.7 kg) IBW/kg (Calculated) : 50.1   Vital Signs: Temp: 99 F (37.2 C) (02/11 0700) BP: 123/64 mmHg (02/11 0700) Pulse Rate: 25  (02/11 0700) Intake/Output from previous day: 02/10 0701 - 02/11 0700 In: 3678.4 [I.V.:3278.4; IV Piggyback:400] Out: 1725 [Urine:1725] Intake/Output from this shift:    Labs:  Basename 02/04/12 0705 02/04/12 0530 02/03/12 0500 02/02/12 1757  WBC 4.8 5.8 9.1 --  HGB 8.1* 5.8* 10.2* --  PLT 241 273 294 --  LABCREA -- -- -- --  CREATININE -- 0.74 1.41* 1.87*   Estimated Creatinine Clearance: 63.5 ml/min (by C-G formula based on Cr of 0.74). No results found for this basename: VANCOTROUGH:2,VANCOPEAK:2,VANCORANDOM:2,GENTTROUGH:2,GENTPEAK:2,GENTRANDOM:2,TOBRATROUGH:2,TOBRAPEAK:2,TOBRARND:2,AMIKACINPEAK:2,AMIKACINTROU:2,AMIKACIN:2, in the last 72 hours   Assessment:  70 YOF admitted 2/09 from NH with fever, AMS, and hypotension. CODE SEPSIS. H/o recurrent UTI/PNA, chronic indwelling catheter, s/p ORIF L femur fx 1/13. Foley changed in the ED. Covering for ESBL organisms. PCT 40.16  D3 Meropenem per pharmacy and D3 of Vancomycin per MD.    Tmax 102.7 past 24hours, patient afebrile since this AM, WBC now wnl and Scr now wnl. Norm. CrCl 80 ml/min, CG 87 ml/min  2/9 MRSA PCR >> Neg  2/9 UA >> + protein, large Leuk, +nitrates 2/9 blood x 2 >> GPC in clusters 2/2  2/9 urine >> Pending - re-incubated for better growth  Goal of Therapy:  adjustment per renal function  Plan:   Change Meropenem to 1gm IV q8h for CrCl of > 50 ml/min.  GPC bacteremia (2/2) Vancomycin per MD - MD ordered Vanc trough for 4 PM today.  Vanc level is yet to be at steady state and with improved renal function, predict level to be low. Would recommend changing Vanc to 1gm IV  q12h and check a vanc trough at steady state. Pharmacy will gladly help with Vancomycin dosing if needed.    Geoffry Paradise, PharmD.   Pager:  [phone] 7:50 AM     **Received new order from CCM to continue Vancomycin as per pharmacy dosing **  Plan:  D/C Vancomycin trough ordered for 4PM today, change vancomycin to 1gm IV q12h.  Will f/u renal fxn and obtain vancomycin trough at steady state.

## 2012-02-04 NOTE — Progress Notes (Signed)
CARE MANAGEMENT NOTE 02/04/2012  Patient:  [name] [name]   Account Number:  [id number]  Date Initiated:  02/04/2012  Documentation initiated by:  [name] [name]  Subjective/Objective Assessment:   pt with mulit-system failures, and sepsis, temps 102-103     Action/Plan:   llives at a snf   Anticipated DC Date:  02/07/2012   Anticipated DC Plan:  SKILLED NURSING FACILITY  In-house referral  Clinical Social Worker      DC Planning Services  NA      [facility] Choice  NA   Choice offered to / List presented to:  NA   DME arranged  NA      DME agency  NA     HH arranged  NA  NA  NA      HH agency  NA   Status of service:  In process, will continue to follow Medicare Important Message given?  NO (If response is "NO", the following Medicare IM given date fields will be blank) Date Medicare IM given:   Date Additional Medicare IM given:    Discharge Disposition:    Per UR Regulation:  Reviewed for med. necessity/level of care/duration of stay  Comments:  [id number]/Rhonda [name],RN,BSN,CCM

## 2012-02-04 NOTE — Progress Notes (Signed)
eLink Physician-Brief Progress Note Patient Name: [name] [name] DOB: March 24, 1941 MRN: [id number]  Date of Service  02/04/2012   HPI/Events of Note  Pain broken femur  eICU Interventions  Low low dose fent   Intervention Category Minor Interventions: Routine modifications to care plan (e.g. PRN medications for pain, fever)  Nelda Bucks. 02/04/2012, 1:54 AM

## 2012-02-04 NOTE — Progress Notes (Signed)
CRITICAL VALUE ALERT  Critical value received:  Hemoglobin 5.8  Date of notification: 02/04/12  Time of notification:  06:44  Critical value read back: yes  Nurse who received alert:  Stephanie Coup, RN  MD notified (1st page):Elink  Time of first page:  6:49  MD notified (2nd page): n/a  Time of second page: n/a  Responding MD:   Time MD responded:

## 2012-02-05 ENCOUNTER — Inpatient Hospital Stay (HOSPITAL_COMMUNITY): Payer: Medicare (Managed Care)

## 2012-02-05 DIAGNOSIS — A419 Sepsis, unspecified organism: Secondary | ICD-10-CM

## 2012-02-05 DIAGNOSIS — N3000 Acute cystitis without hematuria: Secondary | ICD-10-CM

## 2012-02-05 DIAGNOSIS — G35 Multiple sclerosis: Secondary | ICD-10-CM

## 2012-02-05 DIAGNOSIS — R6521 Severe sepsis with septic shock: Secondary | ICD-10-CM

## 2012-02-05 LAB — URINE CULTURE: Colony Count: >=100000

## 2012-02-05 LAB — HEPATIC FUNCTION PANEL
ALT: 63 U/L — ABNORMAL HIGH (ref 0–35)
Albumin: 1.9 g/dL — ABNORMAL LOW (ref 3.5–5.2)
Alkaline Phosphatase: 172 U/L — ABNORMAL HIGH (ref 39–117)
Total Protein: 5.2 g/dL — ABNORMAL LOW (ref 6.0–8.3)

## 2012-02-05 LAB — BASIC METABOLIC PANEL
BUN: 31 mg/dL — ABNORMAL HIGH (ref 6–23)
CO2: 20 mEq/L (ref 19–32)
Calcium: 9.3 mg/dL (ref 8.4–10.5)
Chloride: 121 mEq/L — ABNORMAL HIGH (ref 96–112)
Creatinine, Ser: 0.57 mg/dL (ref 0.50–1.10)
Glucose, Bld: 84 mg/dL (ref 70–99)

## 2012-02-05 LAB — CBC
HCT: 23.6 % — ABNORMAL LOW (ref 36.0–46.0)
Hemoglobin: 7.6 g/dL — ABNORMAL LOW (ref 12.0–15.0)
MCH: 25.2 pg — ABNORMAL LOW (ref 26.0–34.0)
MCV: 78.1 fL (ref 78.0–100.0)
RBC: 3.02 MIL/uL — ABNORMAL LOW (ref 3.87–5.11)
WBC: 6.1 10*3/uL (ref 4.0–10.5)

## 2012-02-05 LAB — CULTURE, BLOOD (ROUTINE X 2): Culture  Setup Time: 201302091709

## 2012-02-05 LAB — GLUCOSE, CAPILLARY

## 2012-02-05 MED ORDER — POTASSIUM CHLORIDE 10 MEQ/100ML IV SOLN
INTRAVENOUS | Status: AC
  Administered 2012-02-05: 10 meq
  Filled 2012-02-05: qty 100

## 2012-02-05 MED ORDER — ENSURE IMMUNE HEALTH PO LIQD
237.0000 mL | ORAL | Status: DC | PRN
  Administered 2012-02-05: 237 mL via ORAL

## 2012-02-05 MED ORDER — PRO-STAT SUGAR FREE PO LIQD
30.0000 mL | Freq: Two times a day (BID) | ORAL | Status: DC
  Administered 2012-02-06 – 2012-02-12 (×12): 30 mL via ORAL
  Filled 2012-02-05 (×14): qty 30

## 2012-02-05 MED ORDER — POTASSIUM CHLORIDE 10 MEQ/50ML IV SOLN
10.0000 meq | INTRAVENOUS | Status: AC
  Administered 2012-02-05 (×5): 10 meq via INTRAVENOUS
  Filled 2012-02-05 (×3): qty 50

## 2012-02-05 MED ORDER — POTASSIUM CHLORIDE 10 MEQ/50ML IV SOLN
INTRAVENOUS | Status: AC
  Filled 2012-02-05: qty 100

## 2012-02-05 MED ORDER — DEXTROSE 5 % IV SOLN
1.0000 g | INTRAVENOUS | Status: DC
  Administered 2012-02-05 – 2012-02-06 (×2): 1 g via INTRAVENOUS
  Filled 2012-02-05 (×2): qty 10

## 2012-02-05 NOTE — Progress Notes (Signed)
CRITICAL VALUE ALERT  Critical value received:  Potassium 2.5  Date of notification:  02/05/12  Time of notification:  5:46  Critical value read back:yes  Nurse who received alert:  Stephanie Coup  MD notified (1st page):  ELINK  Time of first page:  5:48  MD notified (2nd page):  Time of second page:  Responding MD:  Pola Corn  Time MD responded:  5:49

## 2012-02-05 NOTE — Progress Notes (Signed)
INITIAL ADULT NUTRITION ASSESSMENT Date: 02/05/2012   Time: 10:33 AM Reason for Assessment: consult  ASSESSMENT: Female 71 y.o.  Dx: Sepsis syndrome  Hx:  Past Medical History  Diagnosis Date  . Multiple sclerosis   . GERD (gastroesophageal reflux disease)   . Depression   . Recurrent UTI   . Recurrent pneumonia   . Chronic constipation   . Diverticulosis    Past Surgical History  Procedure Date  . Cholecystectomy   . Orif humeral shaft fracture 01/2011  . Video assisted thoracoscopy 06/2010  . Closed reduction shoulder dislocation 04/2010    Fracture dislocation  . Femur im nail 01/13/2012    Procedure: INTRAMEDULLARY (IM) RETROGRADE FEMORAL NAILING;  Surgeon: Thera Flake., MD;  Location: [facility] OR;  Service: Orthopedics;  Laterality: Left;  . Femoral artery exploration 01/13/2012    Procedure: FEMORAL ARTERY EXPLORATION;  Surgeon: Pryor Ochoa, MD;  Location: [facility] OR;  Service: Vascular;  Laterality: Left;    Related Meds:  Scheduled Meds:   . heparin  5,000 Units Subcutaneous Q8H  . meropenem (MERREM) IV  1 g Intravenous Q8H  . potassium chloride  10 mEq Intravenous Q1 Hr x 6  . potassium chloride      . potassium chloride      . DISCONTD: vancomycin  1,000 mg Intravenous Q12H   Continuous Infusions:   . sodium chloride 125 mL/hr at 02/04/12 1202  . norepinephrine (LEVOPHED) Adult infusion Stopped (02/04/12 0556)   PRN Meds:.sodium chloride, acetaminophen, fentaNYL   Ht: 5\' 2"  (157.5 cm)  Wt: 190 lb 7.6 oz (86.4 kg)  Ideal Wt: 50kg % Ideal Wt: 172%  Usual Wt: same, per daughter % Usual Wt: 100%  Body mass index is 34.84 kg/(m^2).  Food/Nutrition Related Hx:   Pt was screened and assessed by RD with daughter present yesterday.  Chart indicated pt was disoriented to time and place, however on assessment pt also disoriented to situation as well.  Not able to give accurate hx.  Daughter in room at time of visit states that pt has had no recent significant wt  gain despite the fact that she eats very poorly.  Pt reports she had some dysphagia and trouble swallowing dry foods in particular- soft breads, crackers, etc.  Daughter reports she cannot chew very well due to MS and doesn't handle meats very well.  Pt is selective in what she eats.  Despite encouragement from daughter, she mostly refuses high protein foods due to food preferences and decreased chewing/swallowing ability.  Pt was on a Regular diet at [facility] with Prostat BID. RN reports pt remains confused with no appetite for food.  Ate some jello and cranberry juice yesterday.  Accepted an Ensure from RN this morning since she refused breakfast.  Labs:  CMP     Component Value Date/Time   NA 148* 02/05/2012 0445   K 2.5* 02/05/2012 0445   CL 121* 02/05/2012 0445   CO2 20 02/05/2012 0445   GLUCOSE 84 02/05/2012 0445   BUN 31* 02/05/2012 0445   CREATININE 0.57 02/05/2012 0445   CALCIUM 9.3 02/05/2012 0445   PROT 5.2* 02/05/2012 0500   ALBUMIN 1.9* 02/05/2012 0500   AST 89* 02/05/2012 0500   ALT 63* 02/05/2012 0500   ALKPHOS 172* 02/05/2012 0500   BILITOT 0.7 02/05/2012 0500   GFRNONAA >90 02/05/2012 0445   GFRAA >90 02/05/2012 0445    CBC    Component Value Date/Time   WBC 6.1 02/05/2012 0445  RBC 3.02* 02/05/2012 0445   HGB 7.6* 02/05/2012 0445   HCT 23.6* 02/05/2012 0445   PLT 248 02/05/2012 0445   MCV 78.1 02/05/2012 0445   MCH 25.2* 02/05/2012 0445   MCHC 32.2 02/05/2012 0445   RDW 20.4* 02/05/2012 0445   LYMPHSABS 1.3 02/02/2012 1240   MONOABS 1.9* 02/02/2012 1240   EOSABS 0.0 02/02/2012 1240   BASOSABS 0.0 02/02/2012 1240    Intake: <10% of meals Output:   Intake/Output Summary (Last 24 hours) at 02/05/12 1109 Last data filed at 02/05/12 1100  Gross per 24 hour  Intake   3745 ml  Output   1770 ml  Net   1975 ml    Diet Order:  None  Supplements/Tube Feeding:  None at this time  IVF:    sodium chloride Last Rate: 125 mL/hr at 02/04/12 1202  norepinephrine (LEVOPHED) Adult infusion Last  Rate: Stopped (02/04/12 0556)    Estimated Nutritional Needs:   Kcal: 1550-1720 kcal Protein: 60-70g Fluid: >1.5 L/day  NUTRITION DIAGNOSIS: -Inadequate oral intake (NI-2.1).  Status: Ongoing  RELATED TO: MS, confusion  AS EVIDENCE BY: Pt refusing foods, only accept small snacks/beverages  MONITORING/EVALUATION(Goals): 1.  Food/Beverage; diet advancement with increased intake to >25% of 3 meals per day.  EDUCATION NEEDS: -Education needs addressed with daughter (2/11)  INTERVENTION: 1.  Modify diet; advance to Regular diet per NP.  RSA yes, with assist.  RD to order upcoming meals for pt per preferences obtained yesterday with daughter. 2.  Supplements; per RN, pt likes chocolate Ensure, will order prn for meal replacement.  Pt typically consumes [facility]stat at [facility].  Order BID to be mixed with cranberry juice.  Dietitian #: [phone]  DOCUMENTATION CODES Per approved criteria  -Not Applicable    Loyce Dys [facility] 02/05/2012, 10:33 AM

## 2012-02-05 NOTE — Progress Notes (Signed)
eLink Physician-Brief Progress Note Patient Name: [name] [name] DOB: 03-04-1941 MRN: [id number]  Date of Service  02/05/2012   HPI/Events of Note   Low K , clv  eICU Interventions  No swallow Iv replace K    Intervention Category Major Interventions: Electrolyte abnormality - evaluation and management  Madalen Gavin J. 02/05/2012, 5:51 AM

## 2012-02-05 NOTE — Progress Notes (Signed)
eLink Physician-Brief Progress Note Patient Name: [name] [name] DOB: [birth year] MRN: [id number]  Date of Service  02/05/2012   HPI/Events of Note  IVFs at 125 cc/hr.  Plan written to decrease IVFs to 75 cc/hr   eICU Interventions  Order for reduction in IVF rate.   Intervention Category Minor Interventions: Routine modifications to care plan (e.g. PRN medications for pain, fever)  Makhayla Mcmurry 02/05/2012, 11:32 PM

## 2012-02-05 NOTE — Progress Notes (Signed)
LOS: 3 PCP -  [name] [name], PACE   PCCM PROGRESS  NOTE  Brief patient profile:  70 yowf NHR with a PMHx of multiple sclerosis, chronic indwelling  foley catheter for approximately the last 6 months, presented 2/9 with a sepsis syndrome - fever 102.7, high lactate, low BP- not responding to 3 L fluids in ER. Foley was changed - UA pos, given zosyn, vanc, BP improved to 90's ,femoral CVL placed by EDP. PCCM asked to assume care. DNR noted & discussed At baseline, she is wheelchair bound, interactive. Lt femoral fracture s/p ORIF in 1/13   Lines / Drains: Rt fem CVL  2/9 >>  Cultures: 2/9 urine >  POS 100 k  gnr>>> 2/9 bld >>2/2 coag neg staph    Antibiotics: Zosyn 2/9  (HCAUTI)>> 2/9 vanc 2/9> 2/11 meropenem (HCAUTI)(note was never on Primaxin) 2/9 >>>  Tests / Events:  Overnight More awake, interactive, denies pain, off O2  Vital Signs: Temp:  [98.4 F (36.9 C)-101.5 F (38.6 C)] 98.4 F (36.9 C) (02/12 0600) Pulse Rate:  [88-119] 99  (02/12 0600) Resp:  [18-26] 19  (02/12 0600) BP: (109-136)/(45-65) 127/59 mmHg (02/12 0600) SpO2:  [72 %-100 %] 99 % (02/12 0800) Weight:  [190 lb 7.6 oz (86.4 kg)] 190 lb 7.6 oz (86.4 kg) (02/12 0500) I/O last 3 completed shifts: In: 4813 [I.V.:4163; IV Piggyback:650] Out: 2970 [Urine:2970]  Physical Examination: General:  Chronically ill appearing, woman , NAD Neuro:  Follows  commands, unable to move left side -chronic per daughter   HEENT:  Dry mucosa, no pallor, citerus, tongue with inflamation. Neck:  No jvd   Cardiovascular:  s1s2 tachy Lungs: decreased bases Abdomen:  Soft, non tender, sediment in foley Musculoskeletal:  Lt LE in boot. Lt fem cvl noted Skin:  Dry turgor, no rash    Labs and Imaging:  Reviewed.    Lab 02/05/12 0445 02/04/12 0530 02/03/12 0500  NA 148* 141 141  K 2.5* 4.0 4.4  CL 121* 115* 111  CO2 20 19 20   BUN 31* 38* 40*  CREATININE 0.57 0.74 1.41*  GLUCOSE 84 83 108*    Lab 02/05/12 0445 02/04/12  0705 02/04/12 0530  HGB 7.6* 8.1* 5.8*  HCT 23.6* 25.4* 17.8*  WBC 6.1 4.8 5.8  PLT 248 241 273    Dg Chest Port 1 View  02/05/2012  *RADIOLOGY REPORT*  Clinical Data: Atelectasis.  PORTABLE CHEST - 1 VIEW  Comparison: 02/04/2012.  Findings: Low volume chest.  Basilar atelectasis.  Perihilar and basilar predominant airspace disease is present.  Cardiopericardial silhouette and mediastinal contours are unchanged.  Patient is rotated to the right. Monitoring leads are projected over the chest.  IMPRESSION: No interval change.  Original Report Authenticated By: Andreas Newport, M.D.          Assessment and Plan: Septic shock due to UTI  - Treat for nosocomial organism - received zosyn 2/9 changed  IMipenem to cover ESBL organisms. 2/2 bc with staph coag neg >  DC vanc 2/11  Discussed advance directives with daughter - they are ok with pressors but no machines - no CPR/ cardioversion or intubation -Mild hypoxia(resolved 2/12) , concern for fluid overload , will drop fluids to 75/h  -doubt PE here , note neg duplex BLEs 1/25, rpt duplex BLEs to r/o DVT -  fem cvl >  Plan dc 2/12 if after picc line established. . Acute renal failure - improving with fluids, resolved 2/11 Lab Results  Component Value Date  CREATININE 0.57 02/05/2012   CREATININE 0.74 02/04/2012   CREATININE 1.41* 02/03/2012    Hyperkalemia -resolved 2/11  Hypokalemia-replete as needed  High alkphos/ mild elevated LFTs  - unclear etio, s/p cholecystectomy, follow. LFT's  for 2/13  Multiple sclerosis - avoid sedating meds - but if she appears in resp distress inspite of oxygen, we plan to change gears & use low dose morphine for comfort. 2/12 better , goal is tx and out of icu.  Nutrition: -consult and advance diet as tolerated    Best practices / Disposition: -->ICU status under PCCM -->DNR except pressors -->Heparin for DVT Px -->family updated at bedside  [facility] Minor ACNP Adolph Pollack PCCM Pager [phone] till 3  pm If no answer page [phone] 02/05/2012, 8:39 AM   Pt independently  seen and examined and available cxr's reviewed and I agree with above findings/ imp/ plan  Sandrea Hughs, MD Pulmonary and Critical Care Medicine [facility] Cell [phone]

## 2012-02-06 ENCOUNTER — Inpatient Hospital Stay (HOSPITAL_COMMUNITY): Payer: Medicare (Managed Care)

## 2012-02-06 DIAGNOSIS — E876 Hypokalemia: Secondary | ICD-10-CM | POA: Diagnosis present

## 2012-02-06 DIAGNOSIS — E162 Hypoglycemia, unspecified: Secondary | ICD-10-CM | POA: Diagnosis present

## 2012-02-06 DIAGNOSIS — D649 Anemia, unspecified: Secondary | ICD-10-CM | POA: Diagnosis present

## 2012-02-06 DIAGNOSIS — IMO0002 Reserved for concepts with insufficient information to code with codable children: Secondary | ICD-10-CM | POA: Diagnosis present

## 2012-02-06 DIAGNOSIS — N179 Acute kidney failure, unspecified: Secondary | ICD-10-CM | POA: Diagnosis present

## 2012-02-06 DIAGNOSIS — E875 Hyperkalemia: Secondary | ICD-10-CM | POA: Diagnosis present

## 2012-02-06 LAB — CBC
HCT: 23.7 % — ABNORMAL LOW (ref 36.0–46.0)
MCHC: 31.6 g/dL (ref 30.0–36.0)
MCV: 78.5 fL (ref 78.0–100.0)
RDW: 20.4 % — ABNORMAL HIGH (ref 11.5–15.5)

## 2012-02-06 LAB — BASIC METABOLIC PANEL
BUN: 22 mg/dL (ref 6–23)
Calcium: 9.4 mg/dL (ref 8.4–10.5)
Creatinine, Ser: 0.54 mg/dL (ref 0.50–1.10)
GFR calc Af Amer: >90 mL/min (ref >90)
GFR calc non Af Amer: >90 mL/min (ref >90)

## 2012-02-06 LAB — RETICULOCYTES
RBC.: 3.07 MIL/uL — ABNORMAL LOW (ref 3.87–5.11)
Retic Ct Pct: 1 % (ref 0.4–3.1)

## 2012-02-06 LAB — BLOOD GAS, ARTERIAL
Bicarbonate: 20.4 mEq/L (ref 20.0–24.0)
Patient temperature: 101.8
TCO2: 19.2 mmol/L (ref 0–100)
pH, Arterial: 7.44 — ABNORMAL HIGH (ref 7.350–7.400)

## 2012-02-06 LAB — IRON AND TIBC
Saturation Ratios: 6 % — ABNORMAL LOW (ref 20–55)
UIBC: 160 ug/dL (ref 125–400)

## 2012-02-06 MED ORDER — GABAPENTIN 300 MG PO CAPS
600.0000 mg | ORAL_CAPSULE | Freq: Every day | ORAL | Status: DC
  Administered 2012-02-06: 600 mg via ORAL
  Filled 2012-02-06 (×2): qty 2

## 2012-02-06 MED ORDER — FLUOXETINE HCL 20 MG PO CAPS
40.0000 mg | ORAL_CAPSULE | Freq: Every day | ORAL | Status: DC
  Administered 2012-02-06: 40 mg via ORAL
  Filled 2012-02-06 (×2): qty 2

## 2012-02-06 MED ORDER — POTASSIUM CHLORIDE 10 MEQ/50ML IV SOLN
10.0000 meq | INTRAVENOUS | Status: AC
  Administered 2012-02-06 (×4): 10 meq via INTRAVENOUS
  Filled 2012-02-06 (×3): qty 50

## 2012-02-06 MED ORDER — PANTOPRAZOLE SODIUM 40 MG PO TBEC
40.0000 mg | DELAYED_RELEASE_TABLET | Freq: Every day | ORAL | Status: DC
  Administered 2012-02-06: 40 mg via ORAL
  Filled 2012-02-06 (×2): qty 1

## 2012-02-06 MED ORDER — DALFAMPRIDINE ER 10 MG PO TB12
10.0000 mg | ORAL_TABLET | Freq: Two times a day (BID) | ORAL | Status: DC
  Administered 2012-02-06: 10 mg via ORAL

## 2012-02-06 MED ORDER — GABAPENTIN 300 MG PO CAPS
300.0000 mg | ORAL_CAPSULE | Freq: Every day | ORAL | Status: DC
  Administered 2012-02-06: 300 mg via ORAL
  Filled 2012-02-06 (×2): qty 1

## 2012-02-06 MED ORDER — IBUPROFEN 800 MG PO TABS
400.0000 mg | ORAL_TABLET | Freq: Four times a day (QID) | ORAL | Status: DC | PRN
  Administered 2012-02-06 – 2012-02-07 (×2): 400 mg via ORAL
  Administered 2012-02-08: 800 mg via ORAL
  Administered 2012-02-08 – 2012-02-11 (×4): 400 mg via ORAL
  Filled 2012-02-06 (×3): qty 1
  Filled 2012-02-06: qty 2
  Filled 2012-02-06 (×3): qty 1

## 2012-02-06 MED ORDER — POTASSIUM CHLORIDE 10 MEQ/50ML IV SOLN
10.0000 meq | INTRAVENOUS | Status: AC
  Administered 2012-02-06 – 2012-02-07 (×4): 10 meq via INTRAVENOUS
  Filled 2012-02-06 (×4): qty 50

## 2012-02-06 MED ORDER — SODIUM CHLORIDE 0.9 % IJ SOLN
10.0000 mL | INTRAMUSCULAR | Status: DC | PRN
  Administered 2012-02-09 – 2012-02-12 (×3): 10 mL

## 2012-02-06 MED ORDER — VITAMIN C 500 MG PO TABS
500.0000 mg | ORAL_TABLET | Freq: Two times a day (BID) | ORAL | Status: DC
  Administered 2012-02-06 (×2): 500 mg via ORAL
  Filled 2012-02-06 (×4): qty 1

## 2012-02-06 MED ORDER — SODIUM CHLORIDE 0.9 % IJ SOLN
10.0000 mL | Freq: Two times a day (BID) | INTRAMUSCULAR | Status: DC
  Administered 2012-02-06 – 2012-02-09 (×5): 10 mL
  Administered 2012-02-09: 20 mL
  Administered 2012-02-10 – 2012-02-11 (×3): 10 mL

## 2012-02-06 MED ORDER — DEXTROSE-NACL 5-0.9 % IV SOLN
INTRAVENOUS | Status: DC
  Administered 2012-02-06: 17:00:00 via INTRAVENOUS
  Administered 2012-02-07: 75 mL/h via INTRAVENOUS
  Administered 2012-02-07 – 2012-02-11 (×6): via INTRAVENOUS

## 2012-02-06 MED ORDER — POTASSIUM CHLORIDE CRYS ER 20 MEQ PO TBCR
EXTENDED_RELEASE_TABLET | ORAL | Status: AC
  Filled 2012-02-06: qty 2

## 2012-02-06 MED ORDER — GABAPENTIN 300 MG PO CAPS: 300.0000 mg | ORAL_CAPSULE | ORAL | Status: DC

## 2012-02-06 MED ORDER — POTASSIUM CHLORIDE 10 MEQ/50ML IV SOLN
INTRAVENOUS | Status: AC
  Administered 2012-02-06: 10 meq via INTRAVENOUS
  Filled 2012-02-06: qty 50

## 2012-02-06 MED ORDER — FLUOXETINE HCL 40 MG PO CAPS: 40.0000 mg | ORAL_CAPSULE | Freq: Every day | ORAL | Status: DC

## 2012-02-06 MED ORDER — THERA M PLUS PO TABS
1.0000 | ORAL_TABLET | Freq: Every day | ORAL | Status: DC
Start: 2012-02-06 — End: 2012-02-06
  Administered 2012-02-06: 1 via ORAL
  Filled 2012-02-06: qty 1

## 2012-02-06 MED ORDER — GABAPENTIN 300 MG PO CAPS
300.0000 mg | ORAL_CAPSULE | ORAL | Status: DC
  Filled 2012-02-06 (×3): qty 1

## 2012-02-06 MED ORDER — ADULT MULTIVITAMIN W/MINERALS CH
1.0000 | ORAL_TABLET | Freq: Every day | ORAL | Status: DC
Start: 2012-02-07 — End: 2012-02-07
  Filled 2012-02-06: qty 1

## 2012-02-06 MED ORDER — POTASSIUM CHLORIDE CRYS ER 20 MEQ PO TBCR
40.0000 meq | EXTENDED_RELEASE_TABLET | Freq: Two times a day (BID) | ORAL | Status: DC
  Administered 2012-02-06: 40 meq via ORAL
  Filled 2012-02-06: qty 1
  Filled 2012-02-06: qty 2
  Filled 2012-02-06: qty 1

## 2012-02-06 NOTE — Progress Notes (Signed)
Patient was at [facility] and was there for rehab. CSW confirmed with patients daughter, Kenney Houseman, that patient is to return to [facility] upon discharge.  Tashunda Vandezande C. Barry Faircloth MSW, LCSW [phone]

## 2012-02-06 NOTE — Progress Notes (Signed)
PATIENT DETAILS Name: [name] [name] Age: 71 y.o. Sex: female Date of Birth: 1941-11-16 Admit Date: 02/02/2012 GNF:AOZHYQM,VHQION NICHOLAS, MD, MD Emergency contact: Aneta Mins [phone] son, Lilyan Punt [phone] daughter   CONSULTS: 1.  Dr. Sandrea Hughs, PCCM  Interval History: [name] [name] is a 71 year old female with a PMHx of multiple sclerosis, chronic indwelling foley catheter for approximately the last 6 months, presented 2/9 with a sepsis syndrome - fever 102.7, high lactate, low BP- not responding to 3 L fluids in ER. Foley was changed - UA pos, given zosyn, vanc, BP improved to 90's ,femoral CVL placed by EDP. PCCM asked to assume care. DNR noted & discussed.  She was under the care of PCCM until 02/06/12, when [facility]ad hospitalists asked to assume care. At baseline, she is wheelchair bound, interactive.   Lines / Drains:  Rt fem CVL 2/9 >>  Cultures:  2/9 urine > POS 100 k gnr>>> Providencia/Proteus 2/9 bld >>2/2 coag neg staph  Antibiotics:  Zosyn 2/9 (HCAUTI)>>> 2/9  vanc 2/9>>> 2/11  meropenem 2/9 >>>02/06/12 Rocephin 02/05/12>>>   ROS: [name] [name] feels well.  She is mildly confused.  Denies pain, dyspnea.   Objective: Vital signs in last 24 hours: Temp:  [98.2 F (36.8 C)-102 F (38.9 C)] 99.3 F (37.4 C) (02/13 0500) Pulse Rate:  [101-120] 102  (02/13 0500) Resp:  [18-27] 22  (02/13 0500) BP: (96-127)/(45-67) 122/55 mmHg (02/13 0500) SpO2:  [91 %-100 %] 98 % (02/13 0500) Weight:  [85.4 kg (188 lb 4.4 oz)] 85.4 kg (188 lb 4.4 oz) (02/13 0500) Weight change: -1 kg (-2 lb 3.3 oz) Last BM Date: 02/05/12  Intake/Output from previous day:  Intake/Output Summary (Last 24 hours) at 02/06/12 0749 Last data filed at 02/06/12 0600  Gross per 24 hour  Intake 2596.17 ml  Output   2275 ml  Net 321.17 ml     Physical Exam:  Gen:  NAD Cardiovascular:  RRR, No M/R/G Respiratory: Lungs CTAB Gastrointestinal: Abdomen soft, NT/ND with normal active bowel sounds. Extremities: 2+  edema     Lab Results: Basic Metabolic Panel:  Lab 02/06/12 0272 02/05/12 0445 02/04/12 0530 02/03/12 0500 02/02/12 1757  NA 144 148* 141 141 140  K 2.8* 2.5* -- -- --  CL 116* 121* 115* 111 109  CO2 21 20 19 20 20   GLUCOSE 91 84 83 108* 122*  BUN 22 31* 38* 40* 48*  CREATININE 0.54 0.57 0.74 1.41* 1.87*  CALCIUM 9.4 9.3 9.4 9.7 10.6*  MG 1.6 1.6 -- 1.7 --  PHOS 2.4 1.9* -- 3.9 --   GFR Estimated Creatinine Clearance: 66.3 ml/min (by C-G formula based on Cr of 0.54). Liver Function Tests:  Lab 02/05/12 0500 02/02/12 1240  AST 89* 44*  ALT 63* 38*  ALKPHOS 172* 388*  BILITOT 0.7 1.2  PROT 5.2* 6.5  ALBUMIN 1.9* 2.7*    CBC:  Lab 02/06/12 0510 02/05/12 0445 02/04/12 0705 02/04/12 0530 02/03/12 0500 02/02/12 1240  WBC 6.0 6.1 4.8 5.8 9.1 --  NEUTROABS -- -- -- -- -- 8.8*  HGB 7.5* 7.6* 8.1* 5.8* 10.2* --  HCT 23.7* 23.6* 25.4* 17.8* 31.7* --  MCV 78.5 78.1 79.1 78.8 79.3 --  PLT 276 248 241 273 294 --   Cardiac Enzymes:  Lab 02/02/12 1630 02/02/12 1408  CKTOTAL 16 --  CKMB 1.4 --  CKMBINDEX -- --  TROPONINI <0.30 <0.30   CBG:  Lab 02/05/12 0731 02/02/12 1517  GLUCAP 55* 183*   Microbiology Recent Results (  from the past 240 hour(s))  CULTURE, BLOOD (ROUTINE X 2)     Status: Normal   Collection Time   02/02/12 11:55 AM      Component Value Range Status Comment   Specimen Description BLOOD LEFT WRIST  5 ML IN [facility] BOTTLE   Final    Special Requests NONE   Final    Culture  Setup Time [id number]   Final    Culture     Final    Value: STAPHYLOCOCCUS SPECIES (COAGULASE NEGATIVE)     Note: SUSCEPTIBILITIES PERFORMED ON PREVIOUS CULTURE WITHIN THE LAST 5 DAYS.     Note: Gram Stain Report Called to,Read Back By and Verified With: RN A. ARNOLD ON 02/03/12 AT 1528 BY TEDAR   Report Status 02/05/2012 FINAL   Final   CULTURE, BLOOD (ROUTINE X 2)     Status: Normal   Collection Time   02/02/12 12:40 PM      Component Value Range Status Comment   Specimen  Description BLOOD LEFT FEMORAL  5 ML IN [facility] BOTTLE   Final    Special Requests NONE   Final    Culture  Setup Time [id number]   Final    Culture     Final    Value: STAPHYLOCOCCUS SPECIES (COAGULASE NEGATIVE)     Note: RIFAMPIN AND GENTAMICIN SHOULD NOT BE USED AS SINGLE DRUGS FOR TREATMENT OF STAPH INFECTIONS. This organism DOES NOT demonstrate inducible Clindamycin resistance in vitro.     Note: Gram Stain Report Called to,Read Back By and Verified With: RN A. ARNOLD ON 02/03/12 AT 1528 BY TEDAR   Report Status 02/05/2012 FINAL   Final    Organism ID, Bacteria STAPHYLOCOCCUS SPECIES (COAGULASE NEGATIVE)   Final   URINE CULTURE     Status: Normal   Collection Time   02/02/12  1:06 PM      Component Value Range Status Comment   Specimen Description URINE, CLEAN CATCH   Final    Special Requests NONE   Final    Culture  Setup Time [id number]   Final    Colony Count >=100,000 COLONIES/ML   Final    Culture     Final    Value: PROVIDENCIA STUARTII     PROTEUS MIRABILIS   Report Status 02/05/2012 FINAL   Final    Organism ID, Bacteria PROVIDENCIA STUARTII   Final    Organism ID, Bacteria PROTEUS MIRABILIS   Final   MRSA PCR SCREENING     Status: Normal   Collection Time   02/02/12  4:00 PM      Component Value Range Status Comment   MRSA by PCR NEGATIVE  NEGATIVE  Final     Recent Results (from the past 240 hour(s))  CULTURE, BLOOD (ROUTINE X 2)     Status: Normal   Collection Time   02/02/12 11:55 AM      Component Value Range Status Comment   Specimen Description BLOOD LEFT WRIST  5 ML IN [facility] BOTTLE   Final    Special Requests NONE   Final    Culture  Setup Time [id number]   Final    Culture     Final    Value: STAPHYLOCOCCUS SPECIES (COAGULASE NEGATIVE)     Note: SUSCEPTIBILITIES PERFORMED ON PREVIOUS CULTURE WITHIN THE LAST 5 DAYS.     Note: Gram Stain Report Called to,Read Back By and Verified With: RN A. ARNOLD ON 02/03/12 AT 1528 BY TEDAR   Report Status  02/05/2012 FINAL    Final   CULTURE, BLOOD (ROUTINE X 2)     Status: Normal   Collection Time   02/02/12 12:40 PM      Component Value Range Status Comment   Specimen Description BLOOD LEFT FEMORAL  5 ML IN [facility] BOTTLE   Final    Special Requests NONE   Final    Culture  Setup Time [id number]   Final    Culture     Final    Value: STAPHYLOCOCCUS SPECIES (COAGULASE NEGATIVE)     Note: RIFAMPIN AND GENTAMICIN SHOULD NOT BE USED AS SINGLE DRUGS FOR TREATMENT OF STAPH INFECTIONS. This organism DOES NOT demonstrate inducible Clindamycin resistance in vitro.     Note: Gram Stain Report Called to,Read Back By and Verified With: RN A. ARNOLD ON 02/03/12 AT 1528 BY TEDAR   Report Status 02/05/2012 FINAL   Final    Organism ID, Bacteria STAPHYLOCOCCUS SPECIES (COAGULASE NEGATIVE)   Final   URINE CULTURE     Status: Normal   Collection Time   02/02/12  1:06 PM      Component Value Range Status Comment   Specimen Description URINE, CLEAN CATCH   Final    Special Requests NONE   Final    Culture  Setup Time [id number]   Final    Colony Count >=100,000 COLONIES/ML   Final    Culture     Final    Value: PROVIDENCIA STUARTII     PROTEUS MIRABILIS   Report Status 02/05/2012 FINAL   Final    Organism ID, Bacteria PROVIDENCIA STUARTII   Final    Organism ID, Bacteria PROTEUS MIRABILIS   Final   MRSA PCR SCREENING     Status: Normal   Collection Time   02/02/12  4:00 PM      Component Value Range Status Comment   MRSA by PCR NEGATIVE  NEGATIVE  Final     Studies/Results: Dg Chest Port 1 View  02/06/2012  *RADIOLOGY REPORT*  Clinical Data: Atelectasis.  PORTABLE CHEST - 1 VIEW  Comparison: 02/05/2012.  Findings: Trachea is midline, given patient rotation.  Heart size is grossly stable.  There is diffuse bilateral air space disease, somewhat asymmetric on the right.  Left hemidiaphragm is elevated.  IMPRESSION: Diffuse bilateral air space disease, somewhat asymmetric on the right, possibly due to edema.  Pneumonia is not  excluded.  Original Report Authenticated By: Reyes Ivan, M.D.   Dg Chest Port 1 View  02/05/2012  *RADIOLOGY REPORT*  Clinical Data: Atelectasis.  PORTABLE CHEST - 1 VIEW  Comparison: 02/04/2012.  Findings: Low volume chest.  Basilar atelectasis.  Perihilar and basilar predominant airspace disease is present.  Cardiopericardial silhouette and mediastinal contours are unchanged.  Patient is rotated to the right. Monitoring leads are projected over the chest.  IMPRESSION: No interval change.  Original Report Authenticated By: Andreas Newport, M.D.    Medications: Scheduled Meds:    . cefTRIAXone (ROCEPHIN)  IV  1 g Intravenous Q24H  . feeding supplement  30 mL Oral BID  . heparin  5,000 Units Subcutaneous Q8H  . potassium chloride  10 mEq Intravenous Q1 Hr x 6  . potassium chloride      . potassium chloride      . DISCONTD: meropenem (MERREM) IV  1 g Intravenous Q8H   Continuous Infusions:    . sodium chloride 75 mL/hr at 02/06/12 0428  . norepinephrine (LEVOPHED) Adult infusion Stopped (02/04/12 0556)   PRN  Meds:.sodium chloride, acetaminophen, feeding supplement, fentaNYL Antibiotics: Anti-infectives     Start     Dose/Rate Route Frequency Ordered Stop   02/05/12 2200   cefTRIAXone (ROCEPHIN) 1 g in dextrose 5 % 50 mL IVPB        1 g 100 mL/hr over 30 Minutes Intravenous Every 24 hours 02/05/12 1413     02/04/12 1200   meropenem (MERREM) 1 g in sodium chloride 0.9 % 100 mL IVPB  Status:  Discontinued        1 g 200 mL/hr over 30 Minutes Intravenous Every 8 hours 02/04/12 0751 02/05/12 1414   02/04/12 1000   vancomycin (VANCOCIN) IVPB 1000 mg/200 mL premix  Status:  Discontinued        1,000 mg 200 mL/hr over 60 Minutes Intravenous Every 12 hours 02/04/12 0938 02/04/12 1427   02/03/12 1800   vancomycin (VANCOCIN) IVPB 1000 mg/200 mL premix  Status:  Discontinued        1,000 mg 200 mL/hr over 60 Minutes Intravenous Every 24 hours 02/03/12 1631 02/04/12 0938   02/03/12  1645   vancomycin (VANCOCIN) IVPB 1000 mg/200 mL premix  Status:  Discontinued        1,000 mg 200 mL/hr over 60 Minutes Intravenous  Once 02/03/12 1631 02/03/12 1631   02/03/12 1600   meropenem (MERREM) 1 g in sodium chloride 0.9 % 100 mL IVPB  Status:  Discontinued        1 g 200 mL/hr over 30 Minutes Intravenous Every 12 hours 02/03/12 1024 02/04/12 0751   02/03/12 0400   meropenem (MERREM) 500 mg in sodium chloride 0.9 % 50 mL IVPB  Status:  Discontinued        500 mg 100 mL/hr over 30 Minutes Intravenous Every 12 hours 02/02/12 1507 02/03/12 1024   02/02/12 2200   piperacillin-tazobactam (ZOSYN) IVPB 3.375 g  Status:  Discontinued        3.375 g 12.5 mL/hr over 240 Minutes Intravenous 3 times per day 02/02/12 1227 02/02/12 1457   02/02/12 1515   meropenem (MERREM) 500 mg in sodium chloride 0.9 % 50 mL IVPB        500 mg 100 mL/hr over 30 Minutes Intravenous To Emergency Dept 02/02/12 1507 02/02/12 1630   02/02/12 1400   piperacillin-tazobactam (ZOSYN) IVPB 3.375 g  Status:  Discontinued        3.375 g 100 mL/hr over 30 Minutes Intravenous 3 times per day 02/02/12 1223 02/02/12 1225   02/02/12 1300   vancomycin (VANCOCIN) IVPB 1000 mg/200 mL premix        1,000 mg 200 mL/hr over 60 Minutes Intravenous  Once 02/02/12 1223 02/02/12 1434   02/02/12 1300   piperacillin-tazobactam (ZOSYN) IVPB 3.375 g        3.375 g 100 mL/hr over 30 Minutes Intravenous  Once 02/02/12 1227 02/02/12 1339           Assessment/Plan:  Principal Problem:  *Septic shock /  Infection due to urinary indwelling catheter The patient was admitted and cultures of her blood and urine were obtained.  She was given fluids for volume resuscitation.  She was initially treated with broad spectrum antibiotics including Imipenem and Vancomycin.  Blood cultures were positive for coagulase negative staph and urine cultures grew Providentia and Proteus, both sensitive to Rocephin, so her antibiotics were narrowed  on 02/05/12.  She did require pressor support with Levophed, which has now been weaned off.   Active Problems:  Multiple sclerosis  All sedating medications were discontinued.  The patient is wheelchair bound at baseline.  Appears to be back to baseline.  Will get PT/OT evaluations.  Resume Ampyra.  Hypokalemia /  Hyperkalemia Will replace potassium, likely low from fluid/electrolyte shifts.  Elevated LFTs Unclear etiology, but likely from sepsis.  She is s/p cholecystectomy.  Will monitor.  Normocytic anemia Will check anemia panel.  Hypoglycemia Not on any hypoglycemics.  Will change IVF to dextrose containing.  ARF (acute renal failure) Resolved with IVF, restoration of BP.  Depression Resume Prozac.   *I spoke with the patient's son, Aneta Mins, by telephone and updated him about his mother's condition and plan of care.  I answered all of his questions. I left a message on the patient's daughter's telephone answering machine with an introduction and how to contact me.     LOS: 4 days   Hillery Aldo, MD Pager [phone]  02/06/2012, 7:49 AM

## 2012-02-06 NOTE — Progress Notes (Signed)
Edwards ([facility] coverage) paged for increased HR, RR, and pt becoming more lethargic. Unable to take PO medications at this time. Arouses to voice but cannot maintain eye contact, goes back to sleep instantly. This RN and two others have tried to arouse patient enough to  [place] but unsuccessful. Patient unable to answer questions at this time/ will not tell me her name when asked.

## 2012-02-07 ENCOUNTER — Encounter (HOSPITAL_COMMUNITY): Payer: Self-pay | Admitting: *Deleted

## 2012-02-07 ENCOUNTER — Inpatient Hospital Stay (HOSPITAL_COMMUNITY): Payer: Medicare (Managed Care)

## 2012-02-07 DIAGNOSIS — J189 Pneumonia, unspecified organism: Secondary | ICD-10-CM | POA: Diagnosis present

## 2012-02-07 LAB — CBC
MCH: 25.3 pg — ABNORMAL LOW (ref 26.0–34.0)
MCHC: 32.1 g/dL (ref 30.0–36.0)
MCV: 78.9 fL (ref 78.0–100.0)
Platelets: 235 10*3/uL (ref 150–400)
RBC: 3.08 MIL/uL — ABNORMAL LOW (ref 3.87–5.11)
RDW: 20.7 % — ABNORMAL HIGH (ref 11.5–15.5)

## 2012-02-07 LAB — COMPREHENSIVE METABOLIC PANEL
ALT: 45 U/L — ABNORMAL HIGH (ref 0–35)
AST: 32 U/L (ref 0–37)
Albumin: 1.8 g/dL — ABNORMAL LOW (ref 3.5–5.2)
Alkaline Phosphatase: 151 U/L — ABNORMAL HIGH (ref 39–117)
Chloride: 115 mEq/L — ABNORMAL HIGH (ref 96–112)
Potassium: 4.3 mEq/L (ref 3.5–5.1)
Sodium: 144 mEq/L (ref 135–145)
Total Bilirubin: 0.5 mg/dL (ref 0.3–1.2)

## 2012-02-07 LAB — PRO B NATRIURETIC PEPTIDE: Pro B Natriuretic peptide (BNP): 6689 pg/mL — ABNORMAL HIGH (ref 0–125)

## 2012-02-07 LAB — PROCALCITONIN: Procalcitonin: 2.87 ng/mL

## 2012-02-07 MED ORDER — PIPERACILLIN-TAZOBACTAM 3.375 G IVPB
3.3750 g | Freq: Three times a day (TID) | INTRAVENOUS | Status: DC
  Administered 2012-02-07 – 2012-02-10 (×9): 3.375 g via INTRAVENOUS
  Filled 2012-02-07 (×13): qty 50

## 2012-02-07 MED ORDER — VANCOMYCIN HCL 1000 MG IV SOLR
750.0000 mg | Freq: Two times a day (BID) | INTRAVENOUS | Status: DC
  Administered 2012-02-07 – 2012-02-10 (×7): 750 mg via INTRAVENOUS
  Filled 2012-02-07 (×8): qty 750

## 2012-02-07 MED ORDER — PIPERACILLIN-TAZOBACTAM 3.375 G IVPB
3.3750 g | Freq: Once | INTRAVENOUS | Status: AC
  Administered 2012-02-07: 3.375 g via INTRAVENOUS
  Filled 2012-02-07: qty 50

## 2012-02-07 NOTE — Progress Notes (Signed)
Patient is responding to voice, does not open eyes but verbally responds to stimulation. BP remains soft.

## 2012-02-07 NOTE — Progress Notes (Signed)
Pt with temp of 39.8 per foley temp. Black box called awaiting orders. Pt given PR tylenol.

## 2012-02-07 NOTE — Progress Notes (Signed)
Westley Hummer paged to update that BNP resulted and is elevated, CXR is complete, BP remains low.  Pt is very lethargic, responds to voice with grunts but will not open eyes.

## 2012-02-07 NOTE — Progress Notes (Signed)
Spoke with Westley Hummer NP concerning transfer to telemetry. Per Burnell Blanks RN Charge for days, Dr Vassie Loll consented to transferring to tele tonight after reviewing pt condition. This info passed on to Westley Hummer prior to the transfer order.

## 2012-02-07 NOTE — Progress Notes (Signed)
   CARE MANAGEMENT NOTE 02/07/2012  Patient:  [name] [name],[name] [name]   Account Number:  [id number]  Date Initiated:  02/04/2012  Documentation initiated by:  [name] [name],RHONDA  Subjective/Objective Assessment:   pt with mulit-system failures, and sepsis, temps 102-103     Action/Plan:   llives at a snf   Anticipated DC Date:  02/12/2012   Anticipated DC Plan:  SKILLED NURSING FACILITY  In-house referral  Clinical Social Worker      DC Planning Services  NA      [facility] Choice  NA   Choice offered to / List presented to:  NA   DME arranged  NA      DME agency  NA     HH arranged  NA  NA  NA      HH agency  NA   Status of service:  In process, will continue to follow Medicare Important Message given?  NO (If response is "NO", the following Medicare IM given date fields will be blank) Date Medicare IM given:   Date Additional Medicare IM given:    Discharge Disposition:    Per UR Regulation:  Reviewed for med. necessity/level of care/duration of stay  Comments:  02/07/12 [name] [name] [name] [name],[name] [name] NCM [phone] NOTED DECOMPENSATED.PATIENT IS ALSO FOLLOWED BY [facility]#[id number](SOCIAL WORKER -ONLY IF NEEDED).OUR CSW FOLLOWING FOR D/C PLANS RETURN TO SNF @ D/C.  [id number]/[name] [name],[name] [name],[name] [name],[name] [name]

## 2012-02-07 NOTE — Progress Notes (Signed)
Westley Hummer paged to discuss patient's BP. Edwards returned page and spoke with this RN on the phone. She is looking into the patient and will call back with orders.

## 2012-02-07 NOTE — Progress Notes (Addendum)
PATIENT DETAILS Name: [name] [name] Age: 71 y.o. Sex: female Date of Birth: April 05, 1941 Admit Date: 02/02/2012 ZOX:[id number] NICHOLAS, MD, MD Emergency contact: Aneta Mins [phone] son, Lilyan Punt [phone] daughter   Medical Consults:  Dr. Sandrea Hughs, PCCM  Other Consults:  Social Work: Patient was resident of [facility] and plans to return there at discharge.  Interval History: [name] [name] is a 71 year old female with a PMHx of multiple sclerosis, chronic indwelling foley catheter for approximately the last 6 months, presented 2/9 with a sepsis syndrome - fever 102.7, high lactate, low BP- not responding to 3 L fluids in ER. Foley was changed - UA pos, given zosyn, vanc, BP improved to 90's ,femoral CVL placed by EDP. PCCM asked to assume care. DNR noted & discussed (although pressors and anti-arrhythmics OK). She was under the care of PCCM until 02/06/12, when [facility] hospitalists asked to assume care. At baseline, she is wheelchair bound, interactive. On 02/07/12, developed fevers, tachycardia and recurrent hypotension.    Lines / Drains:  Rt fem CVL 2/9 >> 02/06/12 PICC 02/06/12 >> Cultures:  2/9 urine > POS 100 k gnr>>> Providencia/Proteus  2/9 bld >>2/2 coag neg staph  2/13 bld >> Antibiotics:  Zosyn 2/9 (HCAUTI)>>> 2/9  vanc 2/9>>> 2/11  meropenem 2/9 >>>02/06/12  Rocephin 02/05/12>>> Zosyn 2/13 >>> Vancomycin 2/13>>>  ROS: [name] [name] is more lethargic today.  She is not able to awaken enough for me to get a ROS.   Objective: Vital signs in last 24 hours: Temp:  [98.6 F (37 C)-103.3 F (39.6 C)] 99 F (37.2 C) (02/14 0700) Pulse Rate:  [29-125] 97  (02/14 0700) Resp:  [19-28] 22  (02/14 0700) BP: (69-131)/(33-74) 93/48 mmHg (02/14 0700) SpO2:  [85 %-100 %] 100 % (02/14 0700) Weight:  [88.1 kg (194 lb 3.6 oz)] 88.1 kg (194 lb 3.6 oz) (02/14 0500) Weight change: 2.7 kg (5 lb 15.2 oz) Last BM Date: 02/05/12  Intake/Output from previous  day:  Intake/Output Summary (Last 24 hours) at 02/07/12 0734 Last data filed at 02/07/12 0700  Gross per 24 hour  Intake 1088.75 ml  Output   1910 ml  Net -821.25 ml     Physical Exam:  Gen:  Lethargic Cardiovascular:  RRR, No M/R/G Respiratory: Lungs CTAB upper, diminished in bases Gastrointestinal: Abdomen soft, NT/ND with normal active bowel sounds. Extremities: No C/E/C, pedal pulses 2+     Lab Results: Basic Metabolic Panel:  Lab 02/07/12 5784 02/06/12 0510 02/05/12 0445 02/04/12 0530 02/03/12 0500  NA 144 144 148* 141 141  K 4.3 2.8* -- -- --  CL 115* 116* 121* 115* 111  CO2 22 21 20 19 20   GLUCOSE 110* 91 84 83 108*  BUN 22 22 31* 38* 40*  CREATININE 0.55 0.54 0.57 0.74 1.41*  CALCIUM 9.1 9.4 9.3 9.4 9.7  MG -- 1.6 1.6 -- 1.7  PHOS -- 2.4 1.9* -- 3.9   GFR Estimated Creatinine Clearance: 67.5 ml/min (by C-G formula based on Cr of 0.55). Liver Function Tests:  Lab 02/07/12 0458 02/05/12 0500 02/02/12 1240  AST 32 89* 44*  ALT 45* 63* 38*  ALKPHOS 151* 172* 388*  BILITOT 0.5 0.7 1.2  PROT 4.9* 5.2* 6.5  ALBUMIN 1.8* 1.9* 2.7*    CBC:  Lab 02/07/12 0458 02/06/12 0510 02/05/12 0445 02/04/12 0705 02/04/12 0530 02/02/12 1240  WBC 8.1 6.0 6.1 4.8 5.8 --  NEUTROABS -- -- -- -- -- 8.8*  HGB 7.8* 7.5* 7.6* 8.1* 5.8* --  HCT 24.3* 23.7* 23.6* 25.4* 17.8* --  MCV 78.9 78.5 78.1 79.1 78.8 --  PLT 235 276 248 241 273 --   Cardiac Enzymes:  Lab 02/02/12 1630 02/02/12 1408  CKTOTAL 16 --  CKMB 1.4 --  CKMBINDEX -- --  TROPONINI <0.30 <0.30   CBG:  Lab 02/05/12 0731 02/02/12 1517  GLUCAP 55* 183*   Anemia work up  [employer] 02/06/12 0928  VITAMINB12 744  FOLATE 12.2  FERRITIN 120  TIBC 170*  IRON 10*  RETICCTPCT 1.0   Microbiology Recent Results (from the past 240 hour(s))  CULTURE, BLOOD (ROUTINE X 2)     Status: Normal   Collection Time   02/02/12 11:55 AM      Component Value Range Status Comment   Specimen Description BLOOD LEFT WRIST  5  ML IN [facility] BOTTLE   Final    Special Requests NONE   Final    Culture  Setup Time [id number]   Final    Culture     Final    Value: STAPHYLOCOCCUS SPECIES (COAGULASE NEGATIVE)     Note: SUSCEPTIBILITIES PERFORMED ON PREVIOUS CULTURE WITHIN THE LAST 5 DAYS.     Note: Gram Stain Report Called to,Read Back By and Verified With: RN A. ARNOLD ON 02/03/12 AT 1528 BY TEDAR   Report Status 02/05/2012 FINAL   Final   CULTURE, BLOOD (ROUTINE X 2)     Status: Normal   Collection Time   02/02/12 12:40 PM      Component Value Range Status Comment   Specimen Description BLOOD LEFT FEMORAL  5 ML IN [facility] BOTTLE   Final    Special Requests NONE   Final    Culture  Setup Time [id number]   Final    Culture     Final    Value: STAPHYLOCOCCUS SPECIES (COAGULASE NEGATIVE)     Note: RIFAMPIN AND GENTAMICIN SHOULD NOT BE USED AS SINGLE DRUGS FOR TREATMENT OF STAPH INFECTIONS. This organism DOES NOT demonstrate inducible Clindamycin resistance in vitro.     Note: Gram Stain Report Called to,Read Back By and Verified With: RN A. ARNOLD ON 02/03/12 AT 1528 BY TEDAR   Report Status 02/05/2012 FINAL   Final    Organism ID, Bacteria STAPHYLOCOCCUS SPECIES (COAGULASE NEGATIVE)   Final   URINE CULTURE     Status: Normal   Collection Time   02/02/12  1:06 PM      Component Value Range Status Comment   Specimen Description URINE, CLEAN CATCH   Final    Special Requests NONE   Final    Culture  Setup Time [id number]   Final    Colony Count >=100,000 COLONIES/ML   Final    Culture     Final    Value: PROVIDENCIA STUARTII     PROTEUS MIRABILIS   Report Status 02/05/2012 FINAL   Final    Organism ID, Bacteria PROVIDENCIA STUARTII   Final    Organism ID, Bacteria PROTEUS MIRABILIS   Final   MRSA PCR SCREENING     Status: Normal   Collection Time   02/02/12  4:00 PM      Component Value Range Status Comment   MRSA by PCR NEGATIVE  NEGATIVE  Final     Recent Results (from the past 240 hour(s))  CULTURE, BLOOD  (ROUTINE X 2)     Status: Normal   Collection Time   02/02/12 11:55 AM      Component Value Range Status Comment  Specimen Description BLOOD LEFT WRIST  5 ML IN [facility] BOTTLE   Final    Special Requests NONE   Final    Culture  Setup Time [id number]   Final    Culture     Final    Value: STAPHYLOCOCCUS SPECIES (COAGULASE NEGATIVE)     Note: SUSCEPTIBILITIES PERFORMED ON PREVIOUS CULTURE WITHIN THE LAST 5 DAYS.     Note: Gram Stain Report Called to,Read Back By and Verified With: RN A. ARNOLD ON 02/03/12 AT 1528 BY TEDAR   Report Status 02/05/2012 FINAL   Final   CULTURE, BLOOD (ROUTINE X 2)     Status: Normal   Collection Time   02/02/12 12:40 PM      Component Value Range Status Comment   Specimen Description BLOOD LEFT FEMORAL  5 ML IN [facility] BOTTLE   Final    Special Requests NONE   Final    Culture  Setup Time [id number]   Final    Culture     Final    Value: STAPHYLOCOCCUS SPECIES (COAGULASE NEGATIVE)     Note: RIFAMPIN AND GENTAMICIN SHOULD NOT BE USED AS SINGLE DRUGS FOR TREATMENT OF STAPH INFECTIONS. This organism DOES NOT demonstrate inducible Clindamycin resistance in vitro.     Note: Gram Stain Report Called to,Read Back By and Verified With: RN A. ARNOLD ON 02/03/12 AT 1528 BY TEDAR   Report Status 02/05/2012 FINAL   Final    Organism ID, Bacteria STAPHYLOCOCCUS SPECIES (COAGULASE NEGATIVE)   Final   URINE CULTURE     Status: Normal   Collection Time   02/02/12  1:06 PM      Component Value Range Status Comment   Specimen Description URINE, CLEAN CATCH   Final    Special Requests NONE   Final    Culture  Setup Time [id number]   Final    Colony Count >=100,000 COLONIES/ML   Final    Culture     Final    Value: PROVIDENCIA STUARTII     PROTEUS MIRABILIS   Report Status 02/05/2012 FINAL   Final    Organism ID, Bacteria PROVIDENCIA STUARTII   Final    Organism ID, Bacteria PROTEUS MIRABILIS   Final   MRSA PCR SCREENING     Status: Normal   Collection Time   02/02/12  4:00 PM       Component Value Range Status Comment   MRSA by PCR NEGATIVE  NEGATIVE  Final     Studies/Results: Dg Chest Port 1 View  02/07/2012  *RADIOLOGY REPORT*  Clinical Data: Shortness of breath  PORTABLE CHEST - 1 VIEW  Comparison: 02/06/2012  Findings: Shallow inspiration.  Cardiac enlargement with pulmonary vascular congestion and perihilar infiltrates.  Changes are similar to previous study.  No pneumothorax.  Right PICC catheter is unchanged in position.  IMPRESSION: Shallow inspiration with stable appearance of cardiac enlargement, pulmonary vascular congestion, and perihilar infiltration.  Original Report Authenticated By: Marlon Pel, M.D.   Dg Chest Port 1 View  02/06/2012  *RADIOLOGY REPORT*  Clinical Data: Increasing shortness of breath.  Decreasing oxygen saturation.  PORTABLE CHEST - 1 VIEW  Comparison: 02/06/2012 at 0416 hours  Findings: Shallow inspiration.  Borderline heart size and pulmonary vascularity.  Bilateral perihilar air space interstitial disease, greater on the right.  Changes could represent pneumonia or edema. Suggestion of blunting of the left costophrenic angle.  No pneumothorax.  Findings appear stable since the previous study. Interval placement of a right PICC  catheter with tip over the low SVC region.  IMPRESSION: Shallow inspiration with borderline heart size and pulmonary vascularity.  Perihilar air space interstitial disease could represent pneumonia or edema.  PICC line tip is over the low SVC region.  Original Report Authenticated By: Marlon Pel, M.D.   Dg Chest Port 1 View  02/06/2012  *RADIOLOGY REPORT*  Clinical Data: Atelectasis.  PORTABLE CHEST - 1 VIEW  Comparison: 02/05/2012.  Findings: Trachea is midline, given patient rotation.  Heart size is grossly stable.  There is diffuse bilateral air space disease, somewhat asymmetric on the right.  Left hemidiaphragm is elevated.  IMPRESSION: Diffuse bilateral air space disease, somewhat asymmetric on  the right, possibly due to edema.  Pneumonia is not excluded.  Original Report Authenticated By: Reyes Ivan, M.D.    Medications: Scheduled Meds:   . cefTRIAXone (ROCEPHIN)  IV  1 g Intravenous Q24H  . dalfampridine  10 mg Oral BID  . feeding supplement  30 mL Oral BID  . FLUoxetine  40 mg Oral Daily  . gabapentin  300 mg Oral QAC supper  . gabapentin  300 mg Oral Q24H  . gabapentin  600 mg Oral Q lunch  . heparin  5,000 Units Subcutaneous Q8H  . mulitivitamin with minerals  1 tablet Oral Daily  . pantoprazole  40 mg Oral Q1200  . potassium chloride  10 mEq Intravenous Q1 Hr x 4  . potassium chloride  10 mEq Intravenous Q1 Hr x 4  . potassium chloride  40 mEq Oral BID  . sodium chloride  10-40 mL Intracatheter Q12H  . vitamin C  500 mg Oral BID  . DISCONTD: FLUoxetine  40 mg Oral Daily  . DISCONTD: gabapentin  300-600 mg Oral See admin instructions  . DISCONTD: multivitamins ther. w/minerals  1 tablet Oral Daily   Continuous Infusions:   . dextrose 5 % and 0.9% NaCl 75 mL/hr (02/07/12 0622)  . DISCONTD: sodium chloride 75 mL/hr at 02/06/12 0428  . DISCONTD: norepinephrine (LEVOPHED) Adult infusion Stopped (02/04/12 0556)   PRN Meds:.sodium chloride, acetaminophen, feeding supplement, fentaNYL, ibuprofen, sodium chloride Antibiotics: Anti-infectives     Start     Dose/Rate Route Frequency Ordered Stop   02/05/12 2200   cefTRIAXone (ROCEPHIN) 1 g in dextrose 5 % 50 mL IVPB        1 g 100 mL/hr over 30 Minutes Intravenous Every 24 hours 02/05/12 1413     02/04/12 1200   meropenem (MERREM) 1 g in sodium chloride 0.9 % 100 mL IVPB  Status:  Discontinued        1 g 200 mL/hr over 30 Minutes Intravenous Every 8 hours 02/04/12 0751 02/05/12 1414   02/04/12 1000   vancomycin (VANCOCIN) IVPB 1000 mg/200 mL premix  Status:  Discontinued        1,000 mg 200 mL/hr over 60 Minutes Intravenous Every 12 hours 02/04/12 0938 02/04/12 1427   02/03/12 1800   vancomycin (VANCOCIN)  IVPB 1000 mg/200 mL premix  Status:  Discontinued        1,000 mg 200 mL/hr over 60 Minutes Intravenous Every 24 hours 02/03/12 1631 02/04/12 0938   02/03/12 1645   vancomycin (VANCOCIN) IVPB 1000 mg/200 mL premix  Status:  Discontinued        1,000 mg 200 mL/hr over 60 Minutes Intravenous  Once 02/03/12 1631 02/03/12 1631   02/03/12 1600   meropenem (MERREM) 1 g in sodium chloride 0.9 % 100 mL IVPB  Status:  Discontinued        1 g 200 mL/hr over 30 Minutes Intravenous Every 12 hours 02/03/12 1024 02/04/12 0751   02/03/12 0400   meropenem (MERREM) 500 mg in sodium chloride 0.9 % 50 mL IVPB  Status:  Discontinued        500 mg 100 mL/hr over 30 Minutes Intravenous Every 12 hours 02/02/12 1507 02/03/12 1024   02/02/12 2200   piperacillin-tazobactam (ZOSYN) IVPB 3.375 g  Status:  Discontinued        3.375 g 12.5 mL/hr over 240 Minutes Intravenous 3 times per day 02/02/12 1227 02/02/12 1457   02/02/12 1515   meropenem (MERREM) 500 mg in sodium chloride 0.9 % 50 mL IVPB        500 mg 100 mL/hr over 30 Minutes Intravenous To Emergency Dept 02/02/12 1507 02/02/12 1630   02/02/12 1400   piperacillin-tazobactam (ZOSYN) IVPB 3.375 g  Status:  Discontinued        3.375 g 100 mL/hr over 30 Minutes Intravenous 3 times per day 02/02/12 1223 02/02/12 1225   02/02/12 1300   vancomycin (VANCOCIN) IVPB 1000 mg/200 mL premix        1,000 mg 200 mL/hr over 60 Minutes Intravenous  Once 02/02/12 1223 02/02/12 1434   02/02/12 1300  piperacillin-tazobactam (ZOSYN) IVPB 3.375 g       3.375 g 100 mL/hr over 30 Minutes Intravenous  Once 02/02/12 1227 02/02/12 1339           Assessment/Plan:  Principal Problem:  *Septic shock / Infection due to urinary indwelling catheter  The patient was admitted and cultures of her blood and urine were obtained. She was given fluids for volume resuscitation. She was initially treated with broad spectrum antibiotics including Imipenem and Vancomycin. Blood  cultures were positive for coagulase negative staph and urine cultures grew Providentia and Proteus, both sensitive to Rocephin, so her antibiotics were narrowed on 02/05/12. She did require pressor support with Levophed, which has now been weaned off. On the night of 2/13-2/14, the patient decompensated with recurrent hypotension, fever, and tachycardia.  Blood cultures were obtained.  Antibiotics were broadened to cover aspiration PNA/HAP (Rocephin d/c'd, Vanc/Zosyn restarted on 02/07/12). Active Problems:  Hospital acquired PNA F/U blood cultures.  Antibiotics broadened.  Multiple sclerosis  All sedating medications were discontinued. The patient is wheelchair bound at baseline. More lethargic today.  Will hold on PT/OT evaluations. Hold Ampyra.  Hypokalemia / Hyperkalemia  Potassium normal today. Elevated LFTs  Unclear etiology, but likely from sepsis. She is s/p cholecystectomy. LFTs trending down over time. Normocytic anemia  Anemia panel shows low iron and low TIBC, which is consistent with anemia of chronic disease. Hypoglycemia  Resolved with dextrose in IVF.  ARF (acute renal failure)  Resolved with IVF, restoration of BP.  Depression  On Prozac, which we will hold given lethargy.  *I spoke with the patient's daughter, Lilyan Punt, and updated her on the patient's condition and plan of care.   LOS: 5 days   Hillery Aldo, MD Pager [phone]  02/07/2012, 7:34 AM

## 2012-02-07 NOTE — Progress Notes (Signed)
This RN has spoken to Westley Hummer and been directed to speak with CCM about patient possibly requiring pressors. (CCM wrote a progress note on patient saying that they spoke with daughter and they want the patient to be DNR but pressors are OK. The current order is a complete DNR).   Dr. Darrick Penna reviewed patient's chart and has decided not to treat the low blood pressure at this time. This RN will continue to monitor patient and update MD as needed.

## 2012-02-07 NOTE — Progress Notes (Signed)
ANTIBIOTIC CONSULT NOTE - INITIAL  Pharmacy Consult for Vanc/Zosyn Indication: PNA/UTI  No Known Allergies  Patient Measurements: Height: 5\' 2"  (157.5 cm) Weight: 194 lb 3.6 oz (88.1 kg) IBW/kg (Calculated) : 50.1   Vital Signs: Temp: 99.1 F (37.3 C) (02/14 0900) Temp src: Core (Comment) (02/14 0800) BP: 92/48 mmHg (02/14 0800) Pulse Rate: 97  (02/14 0900) Intake/Output from previous day: 02/13 0701 - 02/14 0700 In: 1088.8 [P.O.:120; I.V.:718.8; IV Piggyback:250] Out: 1910 [Urine:1910] Intake/Output from this shift: Total I/O In: 510 [P.O.:360; I.V.:150] Out: -   Labs:  Basename 02/07/12 0458 02/06/12 0510 02/05/12 0445  WBC 8.1 6.0 6.1  HGB 7.8* 7.5* 7.6*  PLT 235 276 248  LABCREA -- -- --  CREATININE 0.55 0.54 0.57   Estimated Creatinine Clearance: 67.5 ml/min (by C-G formula based on Cr of 0.55).  Microbiology: Recent Results (from the past 720 hour(s))  URINE CULTURE     Status: Normal   Collection Time   01/12/12  2:35 PM      Component Value Range Status Comment   Specimen Description URINE, CLEAN CATCH   Final    Special Requests NONE   Final    Culture  Setup Time [id number]   Final    Colony Count >=100,000 COLONIES/ML   Final    Culture     Final    Value: Multiple bacterial morphotypes present, none predominant. Suggest appropriate recollection if clinically indicated.   Report Status 01/13/2012 FINAL   Final   SURGICAL PCR SCREEN     Status: Normal   Collection Time   01/13/12  1:24 AM      Component Value Range Status Comment   MRSA, PCR NEGATIVE  NEGATIVE  Final    Staphylococcus aureus NEGATIVE  NEGATIVE  Final   MRSA PCR SCREENING     Status: Normal   Collection Time   01/13/12  1:31 PM      Component Value Range Status Comment   MRSA by PCR NEGATIVE  NEGATIVE  Final   CULTURE, BLOOD (ROUTINE X 2)     Status: Normal   Collection Time   02/02/12 11:55 AM      Component Value Range Status Comment   Specimen Description BLOOD LEFT WRIST  5  ML IN [facility] BOTTLE   Final    Special Requests NONE   Final    Culture  Setup Time [id number]   Final    Culture     Final    Value: STAPHYLOCOCCUS SPECIES (COAGULASE NEGATIVE)     Note: SUSCEPTIBILITIES PERFORMED ON PREVIOUS CULTURE WITHIN THE LAST 5 DAYS.     Note: Gram Stain Report Called to,Read Back By and Verified With: RN A. ARNOLD ON 02/03/12 AT 1528 BY TEDAR   Report Status 02/05/2012 FINAL   Final   CULTURE, BLOOD (ROUTINE X 2)     Status: Normal   Collection Time   02/02/12 12:40 PM      Component Value Range Status Comment   Specimen Description BLOOD LEFT FEMORAL  5 ML IN [facility] BOTTLE   Final    Special Requests NONE   Final    Culture  Setup Time [id number]   Final    Culture     Final    Value: STAPHYLOCOCCUS SPECIES (COAGULASE NEGATIVE)     Note: RIFAMPIN AND GENTAMICIN SHOULD NOT BE USED AS SINGLE DRUGS FOR TREATMENT OF STAPH INFECTIONS. This organism DOES NOT demonstrate inducible Clindamycin resistance in vitro.  Note: Gram Stain Report Called to,Read Back By and Verified With: RN A. ARNOLD ON 02/03/12 AT 1528 BY TEDAR   Report Status 02/05/2012 FINAL   Final    Organism ID, Bacteria STAPHYLOCOCCUS SPECIES (COAGULASE NEGATIVE)   Final   URINE CULTURE     Status: Normal   Collection Time   02/02/12  1:06 PM      Component Value Range Status Comment   Specimen Description URINE, CLEAN CATCH   Final    Special Requests NONE   Final    Culture  Setup Time [id number]   Final    Colony Count >=100,000 COLONIES/ML   Final    Culture     Final    Value: PROVIDENCIA STUARTII     PROTEUS MIRABILIS   Report Status 02/05/2012 FINAL   Final    Organism ID, Bacteria PROVIDENCIA STUARTII   Final    Organism ID, Bacteria PROTEUS MIRABILIS   Final   MRSA PCR SCREENING     Status: Normal   Collection Time   02/02/12  4:00 PM      Component Value Range Status Comment   MRSA by PCR NEGATIVE  NEGATIVE  Final     Medical History: Past Medical History  Diagnosis Date  .  Multiple sclerosis   . GERD (gastroesophageal reflux disease)   . Depression   . Recurrent UTI   . Recurrent pneumonia   . Chronic constipation   . Diverticulosis     Assessment: 70YOF with PMH of MS to begin vancomycin and zosyn for empiric treatment of aspiration pneumonia and treatment of known UTI in setting of chronic indwelling catheter.  Today, patient has developed fevers (Tmax 101.3), tachycardia, and recurrent hypotension.  SCr 0.55 and has been stable.  CrCl~ 67.5 ml/min, CrCl(N)~74 ml/min but these values may be difficult to interpret in setting of MS.  Goal of Therapy:  Vancomycin trough level 15-20 mcg/ml  Plan:  Zosyn 3.375g IV q8h (4 hour infusion time). Vancomycin 750mg  IV q12h. F/u trough level when appropriate.   Clance Boll 02/07/2012,9:44 AM

## 2012-02-08 ENCOUNTER — Inpatient Hospital Stay (HOSPITAL_COMMUNITY): Payer: Medicare (Managed Care)

## 2012-02-08 DIAGNOSIS — M7989 Other specified soft tissue disorders: Secondary | ICD-10-CM

## 2012-02-08 DIAGNOSIS — B370 Candidal stomatitis: Secondary | ICD-10-CM | POA: Diagnosis present

## 2012-02-08 LAB — COMPREHENSIVE METABOLIC PANEL
ALT: 35 U/L (ref 0–35)
AST: 24 U/L (ref 0–37)
Albumin: 1.6 g/dL — ABNORMAL LOW (ref 3.5–5.2)
Alkaline Phosphatase: 137 U/L — ABNORMAL HIGH (ref 39–117)
BUN: 20 mg/dL (ref 6–23)
Chloride: 112 mEq/L (ref 96–112)
Potassium: 3.3 mEq/L — ABNORMAL LOW (ref 3.5–5.1)
Total Bilirubin: 0.4 mg/dL (ref 0.3–1.2)

## 2012-02-08 LAB — CBC
MCH: 25 pg — ABNORMAL LOW (ref 26.0–34.0)
MCHC: 31.6 g/dL (ref 30.0–36.0)
MCV: 79.2 fL (ref 78.0–100.0)
Platelets: 232 10*3/uL (ref 150–400)
RDW: 20.8 % — ABNORMAL HIGH (ref 11.5–15.5)

## 2012-02-08 MED ORDER — POTASSIUM CHLORIDE CRYS ER 20 MEQ PO TBCR
20.0000 meq | EXTENDED_RELEASE_TABLET | Freq: Two times a day (BID) | ORAL | Status: DC
  Administered 2012-02-08 – 2012-02-09 (×3): 20 meq via ORAL
  Filled 2012-02-08 (×4): qty 1

## 2012-02-08 MED ORDER — NYSTATIN 100000 UNIT/ML MT SUSP
5.0000 mL | Freq: Four times a day (QID) | OROMUCOSAL | Status: DC
  Administered 2012-02-08 – 2012-02-12 (×14): 500000 [IU] via ORAL
  Filled 2012-02-08 (×19): qty 5

## 2012-02-08 NOTE — Progress Notes (Signed)
VASCULAR LAB PRELIMINARY  PRELIMINARY  PRELIMINARY  PRELIMINARY  Left upper extremity venous duplex  completed.    Preliminary report:  Superficial thrombosis noted in the left cephalic vein from the mid forearm to the wrist.  All other veins patent.     Terance Hart, 02/08/2012, 3:15 PM

## 2012-02-08 NOTE — Progress Notes (Signed)
PATIENT DETAILS Name: [name] Age: 71 y.o. Sex: female Date of Birth: Jan 27, 1941 Admit Date: 02/02/2012 UJW:[id number] NICHOLAS, MD, MD Emergency contact: Aneta Mins [phone] son, Lilyan Punt [phone] daughter   Medical Consults:  Dr. Sandrea Hughs, PCCM  Other Consults:  Social Work: Patient was resident of [name] and plans to return there at discharge.  Pharmacy: Vancomycin/Zosyn dosing.  Interval History: [name] is a 71 year old female with a PMHx of multiple sclerosis, chronic indwelling foley catheter for approximately the last 6 months, presented 2/9 with a sepsis syndrome - fever 102.7, high lactate, low BP- not responding to 3 L fluids in ER. Foley was changed - UA pos, given zosyn, vanc, BP improved to 90's ,femoral CVL placed by EDP. PCCM asked to assume care. DNR noted & discussed (although pressors and anti-arrhythmics OK). She was under the care of PCCM until 02/06/12, when [facility] hospitalists asked to assume care. At baseline, she is wheelchair bound, interactive. On 02/07/12, developed fevers, tachycardia and recurrent hypotension.  She was thought to have possibly developed an aspiration or HAP.  Her antibiotics were broadened.  Lines / Drains:  Rt fem CVL 2/9 >> 02/06/12 PICC 02/06/12 >> Cultures:  2/9 urine > POS 100 k gnr>>> Providencia/Proteus  2/9 bld >>2/2 coag neg staph  2/13 bld >> Antibiotics:  Zosyn 2/9 (HCAUTI)>>> 2/9  vanc 2/9>>> 2/11  meropenem 2/9 >>>02/06/12  Rocephin 02/05/12>>> Zosyn 2/13 >>> Vancomycin 2/13>>>  ROS: Mrs. Purkey is more awake today.  She denies dyspnea.  The only pain she has is in her mouth.ven   Objective: Vital signs in last 24 hours: Temp:  [97.6 F (36.4 C)-103.3 F (39.6 C)] 97.6 F (36.4 C) (02/15 0617) Pulse Rate:  [88-120] 88  (02/15 0617) Resp:  [19-28] 19  (02/15 0617) BP: (95-118)/(34-51) 103/51 mmHg (02/15 0617) SpO2:  [98 %-100 %] 99 % (02/15 0617) Weight:  [87.1 kg (192 lb 0.3  oz)-88.9 kg (195 lb 15.8 oz)] 87.1 kg (192 lb 0.3 oz) (02/15 0456) Weight change: 0.8 kg (1 lb 12.2 oz) Last BM Date: 02/08/12  Intake/Output from previous day:  Intake/Output Summary (Last 24 hours) at 02/08/12 1256 Last data filed at 02/08/12 0700  Gross per 24 hour  Intake 2593.75 ml  Output    750 ml  Net 1843.75 ml     Physical Exam:  Gen:  Lethargic Cardiovascular:  RRR, No M/R/G Respiratory: Lungs CTAB upper, diminished in bases Gastrointestinal: Abdomen soft, NT/ND with normal active bowel sounds. Extremities: No C/E/C, pedal pulses 2+     Lab Results: Basic Metabolic Panel:  Lab 02/08/12 2841 02/07/12 0458 02/06/12 0510 02/05/12 0445 02/04/12 0530 02/03/12 0500  NA 141 144 144 148* 141 --  K 3.3* 4.3 -- -- -- --  CL 112 115* 116* 121* 115* --  CO2 23 22 21 20 19  --  GLUCOSE 107* 110* 91 84 83 --  BUN 20 22 22  31* 38* --  CREATININE 0.60 0.55 0.54 0.57 0.74 --  CALCIUM 8.8 9.1 9.4 9.3 9.4 --  MG -- -- 1.6 1.6 -- 1.7  PHOS -- -- 2.4 1.9* -- 3.9   GFR Estimated Creatinine Clearance: 71.3 ml/min (by C-G formula based on Cr of 0.6). Liver Function Tests:  Lab 02/08/12 0505 02/07/12 0458 02/05/12 0500 02/02/12 1240  AST 24 32 89* 44*  ALT 35 45* 63* 38*  ALKPHOS 137* 151* 172* 388*  BILITOT 0.4 0.5 0.7 1.2  PROT 4.5* 4.9* 5.2* 6.5  ALBUMIN 1.6*  1.8* 1.9* 2.7*    CBC:  Lab 02/08/12 0505 02/07/12 0458 02/06/12 0510 02/05/12 0445 02/04/12 0705 02/02/12 1240  WBC 7.3 8.1 6.0 6.1 4.8 --  NEUTROABS -- -- -- -- -- 8.8*  HGB 7.1* 7.8* 7.5* 7.6* 8.1* --  HCT 22.5* 24.3* 23.7* 23.6* 25.4* --  MCV 79.2 78.9 78.5 78.1 79.1 --  PLT 232 235 276 248 241 --   Cardiac Enzymes:  Lab 02/02/12 1630 02/02/12 1408  CKTOTAL 16 --  CKMB 1.4 --  CKMBINDEX -- --  TROPONINI <0.30 <0.30   CBG:  Lab 02/05/12 0731 02/02/12 1517  GLUCAP 55* 183*   Anemia work up  [employer] 02/06/12 0928  VITAMINB12 744  FOLATE 12.2  FERRITIN 120  TIBC 170*  IRON 10*  RETICCTPCT  1.0   Microbiology Recent Results (from the past 240 hour(s))  CULTURE, BLOOD (ROUTINE X 2)     Status: Normal   Collection Time   02/02/12 11:55 AM      Component Value Range Status Comment   Specimen Description BLOOD LEFT WRIST  5 ML IN [facility] BOTTLE   Final    Special Requests NONE   Final    Culture  Setup Time [id number]   Final    Culture     Final    Value: STAPHYLOCOCCUS SPECIES (COAGULASE NEGATIVE)     Note: SUSCEPTIBILITIES PERFORMED ON PREVIOUS CULTURE WITHIN THE LAST 5 DAYS.     Note: Gram Stain Report Called to,Read Back By and Verified With: RN A. ARNOLD ON 02/03/12 AT 1528 BY TEDAR   Report Status 02/05/2012 FINAL   Final   CULTURE, BLOOD (ROUTINE X 2)     Status: Normal   Collection Time   02/02/12 12:40 PM      Component Value Range Status Comment   Specimen Description BLOOD LEFT FEMORAL  5 ML IN [facility] BOTTLE   Final    Special Requests NONE   Final    Culture  Setup Time [id number]   Final    Culture     Final    Value: STAPHYLOCOCCUS SPECIES (COAGULASE NEGATIVE)     Note: RIFAMPIN AND GENTAMICIN SHOULD NOT BE USED AS SINGLE DRUGS FOR TREATMENT OF STAPH INFECTIONS. This organism DOES NOT demonstrate inducible Clindamycin resistance in vitro.     Note: Gram Stain Report Called to,Read Back By and Verified With: RN A. ARNOLD ON 02/03/12 AT 1528 BY TEDAR   Report Status 02/05/2012 FINAL   Final    Organism ID, Bacteria STAPHYLOCOCCUS SPECIES (COAGULASE NEGATIVE)   Final   URINE CULTURE     Status: Normal   Collection Time   02/02/12  1:06 PM      Component Value Range Status Comment   Specimen Description URINE, CLEAN CATCH   Final    Special Requests NONE   Final    Culture  Setup Time [id number]   Final    Colony Count >=100,000 COLONIES/ML   Final    Culture     Final    Value: PROVIDENCIA STUARTII     PROTEUS MIRABILIS   Report Status 02/05/2012 FINAL   Final    Organism ID, Bacteria PROVIDENCIA STUARTII   Final    Organism ID, Bacteria PROTEUS MIRABILIS    Final   MRSA PCR SCREENING     Status: Normal   Collection Time   02/02/12  4:00 PM      Component Value Range Status Comment   MRSA by PCR NEGATIVE  NEGATIVE  Final   CULTURE, BLOOD (ROUTINE X 2)     Status: Normal (Preliminary result)   Collection Time   02/06/12  8:35 PM      Component Value Range Status Comment   Specimen Description BLOOD LEFT ARM   Final    Special Requests BOTTLES DRAWN AEROBIC AND ANAEROBIC 4CC   Final    Culture  Setup Time [id number]   Final    Culture     Final    Value:        BLOOD CULTURE RECEIVED NO GROWTH TO DATE CULTURE WILL BE HELD FOR 5 DAYS BEFORE ISSUING A FINAL NEGATIVE REPORT   Report Status PENDING   Incomplete   CULTURE, BLOOD (ROUTINE X 2)     Status: Normal (Preliminary result)   Collection Time   02/06/12  8:45 PM      Component Value Range Status Comment   Specimen Description BLOOD LEFT ARM   Final    Special Requests BOTTLES DRAWN AEROBIC AND ANAEROBIC 2CC   Final    Culture  Setup Time [id number]   Final    Culture     Final    Value:        BLOOD CULTURE RECEIVED NO GROWTH TO DATE CULTURE WILL BE HELD FOR 5 DAYS BEFORE ISSUING A FINAL NEGATIVE REPORT   Report Status PENDING   Incomplete      Studies/Results: Dg Chest 1 View  02/08/2012  *RADIOLOGY REPORT*  Clinical Data: Recheck for pneumonia.  No chest pain or cough.  CHEST - 1 VIEW  Comparison: 02/07/2012  Findings: The patient has right-sided PICC line, tip to the level of the superior vena cava.  There continues to be patchy density within the right lung and medial left lung base with little change from prior study.  Prior left humerus ORIF.  IMPRESSION: Little interval change.  Original Report Authenticated By: Patterson Hammersmith, M.D.   Dg Chest Port 1 View  02/07/2012  *RADIOLOGY REPORT*  Clinical Data: Shortness of breath  PORTABLE CHEST - 1 VIEW  Comparison: 02/06/2012  Findings: Shallow inspiration.  Cardiac enlargement with pulmonary vascular congestion and perihilar  infiltrates.  Changes are similar to previous study.  No pneumothorax.  Right PICC catheter is unchanged in position.  IMPRESSION: Shallow inspiration with stable appearance of cardiac enlargement, pulmonary vascular congestion, and perihilar infiltration.  Original Report Authenticated By: Marlon Pel, M.D.   Dg Chest Port 1 View  02/06/2012  *RADIOLOGY REPORT*  Clinical Data: Increasing shortness of breath.  Decreasing oxygen saturation.  PORTABLE CHEST - 1 VIEW  Comparison: 02/06/2012 at 0416 hours  Findings: Shallow inspiration.  Borderline heart size and pulmonary vascularity.  Bilateral perihilar air space interstitial disease, greater on the right.  Changes could represent pneumonia or edema. Suggestion of blunting of the left costophrenic angle.  No pneumothorax.  Findings appear stable since the previous study. Interval placement of a right PICC catheter with tip over the low SVC region.  IMPRESSION: Shallow inspiration with borderline heart size and pulmonary vascularity.  Perihilar air space interstitial disease could represent pneumonia or edema.  PICC line tip is over the low SVC region.  Original Report Authenticated By: Marlon Pel, M.D.    Medications: Scheduled Meds:    . feeding supplement  30 mL Oral BID  . heparin  5,000 Units Subcutaneous Q8H  . piperacillin-tazobactam (ZOSYN)  IV  3.375 g Intravenous Once  . piperacillin-tazobactam (ZOSYN)  IV  3.375 g  Intravenous Q8H  . sodium chloride  10-40 mL Intracatheter Q12H  . vancomycin  750 mg Intravenous Q12H   Continuous Infusions:    . dextrose 5 % and 0.9% NaCl 75 mL/hr at 02/08/12 1013   PRN Meds:.sodium chloride, acetaminophen, feeding supplement, fentaNYL, ibuprofen, sodium chloride    Assessment/Plan:  Principal Problem:  *Septic shock / Infection due to urinary indwelling catheter  The patient was admitted and cultures of her blood and urine were obtained. She was given fluids for volume  resuscitation. She was initially treated with broad spectrum antibiotics including Imipenem and Vancomycin. Blood cultures were positive for coagulase negative staph and urine cultures grew Providentia and Proteus, both sensitive to Rocephin, so her antibiotics were narrowed on 02/05/12. She did require pressor support with Levophed, which has now been weaned off. On the night of 2/13-2/14, the patient decompensated with recurrent hypotension, fever, and tachycardia.  Blood cultures were obtained.  Antibiotics were broadened to cover aspiration PNA/HAP (Rocephin d/c'd, Vanc/Zosyn restarted on 02/07/12).  Seems a little better today. Active Problems:  Hospital acquired PNA F/U blood cultures.  Antibiotics broadened.  Multiple sclerosis  All sedating medications were discontinued. The patient is wheelchair bound at baseline. More lethargic today.  Will hold on PT/OT evaluations. Hold Ampyra.  Hypokalemia / Hyperkalemia  Potassium low again today.  Start routine supplementation. Elevated LFTs  Unclear etiology, but likely from sepsis. She is s/p cholecystectomy. LFTs trending down over time. Normocytic anemia  Anemia panel shows low iron and low TIBC, which is consistent with anemia of chronic disease. Hypoglycemia  Resolved with dextrose in IVF.  ARF (acute renal failure)  Resolved with IVF, restoration of BP.  Depression  On Prozac, which we will hold given lethargy.  Thrush Start Nystatin.  LUE swelling Check doppler studies, R/O DVT.  *I spoke with the patient's daughter, Lilyan Punt, and updated her on the patient's condition and plan of care.   LOS: 6 days   Hillery Aldo, MD Pager [phone]  02/08/2012, 12:56 PM

## 2012-02-08 NOTE — Progress Notes (Signed)
Report called to misty on 5 east.

## 2012-02-09 DIAGNOSIS — I82619 Acute embolism and thrombosis of superficial veins of unspecified upper extremity: Secondary | ICD-10-CM | POA: Diagnosis present

## 2012-02-09 MED ORDER — POTASSIUM CHLORIDE CRYS ER 20 MEQ PO TBCR
20.0000 meq | EXTENDED_RELEASE_TABLET | Freq: Three times a day (TID) | ORAL | Status: DC
  Administered 2012-02-09 – 2012-02-12 (×9): 20 meq via ORAL
  Filled 2012-02-09 (×11): qty 1

## 2012-02-09 MED ORDER — ACETAMINOPHEN 325 MG PO TABS
650.0000 mg | ORAL_TABLET | ORAL | Status: DC | PRN
  Administered 2012-02-09 – 2012-02-12 (×3): 650 mg via ORAL
  Filled 2012-02-09 (×2): qty 1
  Filled 2012-02-09: qty 2

## 2012-02-09 NOTE — Progress Notes (Signed)
Pharmacy - Vancomycin Vancomycin trough level ordered for this evening not collected prior to Vancomycin dose being hung.  Will reschedule vancomycin trough to be collected prior to 11 AM dose on 02/10/12.  Terrilee Files, PharmD 02/09/12 @ 23:59

## 2012-02-09 NOTE — Progress Notes (Signed)
PATIENT DETAILS Name: [name] Age: 71 y.o. Sex: female Date of Birth: 1941/03/02 Admit Date: 02/02/2012 [id number] NICHOLAS, MD, MD Emergency contact: Aneta Mins [phone] son, Lilyan Punt [phone] daughter   Medical Consults:  Dr. Sandrea Hughs, PCCM  Other Consults:  Social Work: Patient was resident of [facility] and plans to return there at discharge.  Pharmacy: Vancomycin/Zosyn dosing.  Interval History: Mrs. [name] is a 71 year old female with a PMHx of multiple sclerosis, chronic indwelling foley catheter for approximately the last 6 months, presented 2/9 with a sepsis syndrome - fever 102.7, high lactate, low BP- not responding to 3 L fluids in ER. Foley was changed - UA pos, given zosyn, vanc, BP improved to 90's ,femoral CVL placed by EDP. PCCM asked to assume care. DNR noted & discussed (although pressors and anti-arrhythmics OK). She was under the care of PCCM until 02/06/12, when [facility] hospitalists asked to assume care. At baseline, she is wheelchair bound, interactive. On 02/07/12, developed fevers, tachycardia and recurrent hypotension.  She was thought to have possibly developed an aspiration or HAP.  Her antibiotics were broadened, and she has shown improvement.  Lines / Drains:  Rt fem CVL 2/9 >> 02/06/12 PICC 02/06/12 >> Cultures:  2/9 urine > POS 100 k gnr>>> Providencia/Proteus  2/9 bld >>2/2 coag neg staph  2/13 bld >> Antibiotics:  Zosyn 2/9 (HCAUTI)>>> 2/9  vanc 2/9>>> 2/11  meropenem 2/9 >>>02/06/12  Rocephin 02/05/12>>> Zosyn 2/13 >>> Vancomycin 2/13>>>  ROS: Mrs. [name] feels a little better today.  She denies dyspnea and cough.  She still has swelling of her left arm.  Appetite is poor.    Objective: Vital signs in last 24 hours: Temp:  [97.5 F (36.4 C)-98.8 F (37.1 C)] 98.8 F (37.1 C) (02/16 0925) Pulse Rate:  [89-100] 100  (02/16 0925) Resp:  [16-24] 24  (02/16 0925) BP: (99-115)/(38-73) 106/67 mmHg (02/16  0925) SpO2:  [95 %-100 %] 95 % (02/16 0925) Weight:  [87.3 kg (192 lb 7.4 oz)] 87.3 kg (192 lb 7.4 oz) (02/16 6440) Weight change: -1.6 kg (-3 lb 8.4 oz) Last BM Date: 02/08/12  Intake/Output from previous day:  Intake/Output Summary (Last 24 hours) at 02/09/12 1157 Last data filed at 02/09/12 3474  Gross per 24 hour  Intake 2427.5 ml  Output   1090 ml  Net 1337.5 ml     Physical Exam:  Gen:  Lethargic Cardiovascular:  RRR, No M/R/G Respiratory: Lungs CTAB upper, diminished in bases Gastrointestinal: Abdomen soft, NT/ND with normal active bowel sounds. Extremities: LUE swelling, pedal pulses 2+     Lab Results: Basic Metabolic Panel:  Lab 02/08/12 2595 02/07/12 0458 02/06/12 0510 02/05/12 0445 02/04/12 0530 02/03/12 0500  NA 141 144 144 148* 141 --  K 3.3* 4.3 -- -- -- --  CL 112 115* 116* 121* 115* --  CO2 23 22 21 20 19  --  GLUCOSE 107* 110* 91 84 83 --  BUN 20 22 22  31* 38* --  CREATININE 0.60 0.55 0.54 0.57 0.74 --  CALCIUM 8.8 9.1 9.4 9.3 9.4 --  MG -- -- 1.6 1.6 -- 1.7  PHOS -- -- 2.4 1.9* -- 3.9   GFR Estimated Creatinine Clearance: 71.4 ml/min (by C-G formula based on Cr of 0.6). Liver Function Tests:  Lab 02/08/12 0505 02/07/12 0458 02/05/12 0500 02/02/12 1240  AST 24 32 89* 44*  ALT 35 45* 63* 38*  ALKPHOS 137* 151* 172* 388*  BILITOT 0.4 0.5 0.7 1.2  PROT 4.5*  4.9* 5.2* 6.5  ALBUMIN 1.6* 1.8* 1.9* 2.7*    CBC:  Lab 02/08/12 0505 02/07/12 0458 02/06/12 0510 02/05/12 0445 02/04/12 0705 02/02/12 1240  WBC 7.3 8.1 6.0 6.1 4.8 --  NEUTROABS -- -- -- -- -- 8.8*  HGB 7.1* 7.8* 7.5* 7.6* 8.1* --  HCT 22.5* 24.3* 23.7* 23.6* 25.4* --  MCV 79.2 78.9 78.5 78.1 79.1 --  PLT 232 235 276 248 241 --   Cardiac Enzymes:  Lab 02/02/12 1630 02/02/12 1408  CKTOTAL 16 --  CKMB 1.4 --  CKMBINDEX -- --  TROPONINI <0.30 <0.30   CBG:  Lab 02/05/12 0731 02/02/12 1517  GLUCAP 55* 183*   Microbiology Recent Results (from the past 240 hour(s))  CULTURE,  BLOOD (ROUTINE X 2)     Status: Normal   Collection Time   02/02/12 11:55 AM      Component Value Range Status Comment   Specimen Description BLOOD LEFT WRIST  5 ML IN [facility] BOTTLE   Final    Special Requests NONE   Final    Culture  Setup Time [id number]   Final    Culture     Final    Value: STAPHYLOCOCCUS SPECIES (COAGULASE NEGATIVE)     Note: SUSCEPTIBILITIES PERFORMED ON PREVIOUS CULTURE WITHIN THE LAST 5 DAYS.     Note: Gram Stain Report Called to,Read Back By and Verified With: RN A. ARNOLD ON 02/03/12 AT 1528 BY TEDAR   Report Status 02/05/2012 FINAL   Final   CULTURE, BLOOD (ROUTINE X 2)     Status: Normal   Collection Time   02/02/12 12:40 PM      Component Value Range Status Comment   Specimen Description BLOOD LEFT FEMORAL  5 ML IN [facility] BOTTLE   Final    Special Requests NONE   Final    Culture  Setup Time [id number]   Final    Culture     Final    Value: STAPHYLOCOCCUS SPECIES (COAGULASE NEGATIVE)     Note: RIFAMPIN AND GENTAMICIN SHOULD NOT BE USED AS SINGLE DRUGS FOR TREATMENT OF STAPH INFECTIONS. This organism DOES NOT demonstrate inducible Clindamycin resistance in vitro.     Note: Gram Stain Report Called to,Read Back By and Verified With: RN A. ARNOLD ON 02/03/12 AT 1528 BY TEDAR   Report Status 02/05/2012 FINAL   Final    Organism ID, Bacteria STAPHYLOCOCCUS SPECIES (COAGULASE NEGATIVE)   Final   URINE CULTURE     Status: Normal   Collection Time   02/02/12  1:06 PM      Component Value Range Status Comment   Specimen Description URINE, CLEAN CATCH   Final    Special Requests NONE   Final    Culture  Setup Time [id number]   Final    Colony Count >=100,000 COLONIES/ML   Final    Culture     Final    Value: PROVIDENCIA STUARTII     PROTEUS MIRABILIS   Report Status 02/05/2012 FINAL   Final    Organism ID, Bacteria PROVIDENCIA STUARTII   Final    Organism ID, Bacteria PROTEUS MIRABILIS   Final   MRSA PCR SCREENING     Status: Normal   Collection Time   02/02/12   4:00 PM      Component Value Range Status Comment   MRSA by PCR NEGATIVE  NEGATIVE  Final   CULTURE, BLOOD (ROUTINE X 2)     Status: Normal (Preliminary result)   Collection  Time   02/06/12  8:35 PM      Component Value Range Status Comment   Specimen Description BLOOD LEFT ARM   Final    Special Requests BOTTLES DRAWN AEROBIC AND ANAEROBIC 4CC   Final    Culture  Setup Time [id number]   Final    Culture     Final    Value:        BLOOD CULTURE RECEIVED NO GROWTH TO DATE CULTURE WILL BE HELD FOR 5 DAYS BEFORE ISSUING A FINAL NEGATIVE REPORT   Report Status PENDING   Incomplete   CULTURE, BLOOD (ROUTINE X 2)     Status: Normal (Preliminary result)   Collection Time   02/06/12  8:45 PM      Component Value Range Status Comment   Specimen Description BLOOD LEFT ARM   Final    Special Requests BOTTLES DRAWN AEROBIC AND ANAEROBIC 2CC   Final    Culture  Setup Time [id number]   Final    Culture     Final    Value:        BLOOD CULTURE RECEIVED NO GROWTH TO DATE CULTURE WILL BE HELD FOR 5 DAYS BEFORE ISSUING A FINAL NEGATIVE REPORT   Report Status PENDING   Incomplete      Studies/Results: Dg Chest 1 View  02/08/2012  *RADIOLOGY REPORT*  Clinical Data: Recheck for pneumonia.  No chest pain or cough.  CHEST - 1 VIEW  Comparison: 02/07/2012  Findings: The patient has right-sided PICC line, tip to the level of the superior vena cava.  There continues to be patchy density within the right lung and medial left lung base with little change from prior study.  Prior left humerus ORIF.  IMPRESSION: Little interval change.  Original Report Authenticated By: Patterson Hammersmith, M.D.   VASCULAR LAB  PRELIMINARY PRELIMINARY PRELIMINARY PRELIMINARY  Left upper extremity venous duplex completed 02/08/12 Preliminary report: Superficial thrombosis noted in the left cephalic vein from the mid forearm to the wrist. All other veins patent.    Medications: Scheduled Meds:    . feeding supplement  30 mL  Oral BID  . heparin  5,000 Units Subcutaneous Q8H  . nystatin  5 mL Oral QID  . piperacillin-tazobactam (ZOSYN)  IV  3.375 g Intravenous Q8H  . potassium chloride  20 mEq Oral BID  . sodium chloride  10-40 mL Intracatheter Q12H  . vancomycin  750 mg Intravenous Q12H   Continuous Infusions:    . dextrose 5 % and 0.9% NaCl 75 mL/hr at 02/09/12 0146   PRN Meds:.sodium chloride, acetaminophen, acetaminophen, feeding supplement, fentaNYL, ibuprofen, sodium chloride    Assessment/Plan:  Principal Problem:  *Septic shock / Infection due to urinary indwelling catheter  The patient was admitted and cultures of her blood and urine were obtained. She was given fluids for volume resuscitation. She was initially treated with broad spectrum antibiotics including Imipenem and Vancomycin. Blood cultures were positive for coagulase negative staph and urine cultures grew Providentia and Proteus, both sensitive to Rocephin, so her antibiotics were narrowed on 02/05/12. She did require pressor support with Levophed, which has now been weaned off. On the night of 2/13-2/14, the patient decompensated with recurrent hypotension, fever, and tachycardia.  Blood cultures were obtained.  Antibiotics were broadened to cover aspiration PNA/HAP (Rocephin d/c'd, Vanc/Zosyn restarted on 02/07/12).  Continues to improve. Active Problems:  Hospital acquired PNA F/U blood cultures.  Antibiotics broadened.  Multiple sclerosis  All sedating medications were discontinued. The  patient is wheelchair bound at baseline. More lethargic today.  Will hold on PT/OT evaluations. Hold Ampyra.  Hypokalemia / Hyperkalemia  Potassium low again today.  Increase routine supplementation. Elevated LFTs  Unclear etiology, but likely from sepsis. She is s/p cholecystectomy. LFTs trending down over time. Normocytic anemia  Anemia panel shows low iron and low TIBC, which is consistent with anemia of chronic disease. Hypoglycemia  Resolved  with dextrose in IVF.  ARF (acute renal failure)  Resolved with IVF, restoration of BP.  Depression  On Prozac, which we will hold given lethargy.  Thrush Start Nystatin.  LUE swelling secondary to superficial thrombosis in the left cephalic vein No indication for therapeutic dose anti-coagulation.  On SQ heparin for DVT prophylaxis.  *I spoke with the patient's daughter, Lilyan Punt, and updated her on the patient's condition and plan of care.   LOS: 7 days   Hillery Aldo, MD Pager [phone]  02/09/2012, 11:57 AM

## 2012-02-09 NOTE — Progress Notes (Signed)
Notified Dr. Salena Saner. Rama  Of pt refusal to eat 2 meals today (lunch and dinner). No new orders received, MD encouraged nursing to offer ensures or some type of nutritional supplement. Informed MD that was tried by nursing on both occassions and pt refused both time. Will cont to encourage nutritional supplements and monitor pt for any changes.

## 2012-02-10 DIAGNOSIS — R630 Anorexia: Secondary | ICD-10-CM | POA: Diagnosis present

## 2012-02-10 LAB — BASIC METABOLIC PANEL
BUN: 11 mg/dL (ref 6–23)
CO2: 23 mEq/L (ref 19–32)
Calcium: 8.9 mg/dL (ref 8.4–10.5)
Creatinine, Ser: 0.48 mg/dL — ABNORMAL LOW (ref 0.50–1.10)
Glucose, Bld: 92 mg/dL (ref 70–99)

## 2012-02-10 LAB — CBC
MCH: 24.6 pg — ABNORMAL LOW (ref 26.0–34.0)
MCV: 78.4 fL (ref 78.0–100.0)
Platelets: 330 10*3/uL (ref 150–400)
RDW: 20.3 % — ABNORMAL HIGH (ref 11.5–15.5)

## 2012-02-10 MED ORDER — FLUOXETINE HCL 40 MG PO CAPS: 40.0000 mg | ORAL_CAPSULE | Freq: Every day | ORAL | Status: DC

## 2012-02-10 MED ORDER — DALFAMPRIDINE ER 10 MG PO TB12
10.0000 mg | ORAL_TABLET | Freq: Two times a day (BID) | ORAL | Status: DC
  Administered 2012-02-10 – 2012-02-12 (×5): 10 mg via ORAL
  Filled 2012-02-10 (×3): qty 1

## 2012-02-10 MED ORDER — FLUOXETINE HCL 20 MG PO CAPS
40.0000 mg | ORAL_CAPSULE | Freq: Every day | ORAL | Status: DC
  Administered 2012-02-10 – 2012-02-12 (×3): 40 mg via ORAL
  Filled 2012-02-10 (×3): qty 2

## 2012-02-10 NOTE — Progress Notes (Signed)
drsgs to lt hip and lt ankle changed, new mepilex applied , pt tolerated procedure well.

## 2012-02-10 NOTE — Progress Notes (Signed)
PATIENT DETAILS Name: Tamikia Chowning Age: 71 y.o. Sex: female Date of Birth: 07-12-1941 Admit Date: 02/02/2012 FAO:[id number],QIONGE NICHOLAS, MD, MD Emergency contact: Aneta Mins [phone] son, Lilyan Punt [phone] daughter   Medical Consults:  Dr. Sandrea Hughs, PCCM  Other Consults:  Social Work: Patient was resident of [facility] and plans to return there at discharge.  Pharmacy: Vancomycin/Zosyn dosing.  Dietician: Assessment of nutritional status  Brief Narrative: Mrs. Malphrus is a 71 year old female with a PMHx of multiple sclerosis, chronic indwelling foley catheter for approximately the last 6 months, presented 2/9 with a sepsis syndrome - fever 102.7, high lactate, low BP who did not respond to a 3 L fluid challenge in ER. Her foley was changed - UA pos, given zosyn, vanc, BP improved to 90's , femoral CVL placed by EDP. PCCM asked to assume care. DNR noted & discussed (although pressors and anti-arrhythmics OK). She was under the care of PCCM until 02/06/12, when triad hospitalists asked to assume care. At baseline, she is mostly wheelchair bound. Her antibiotics were narrowed to Rocephin on 02/05/12 based on urine culture data.  On 02/07/12, developed fevers, tachycardia and recurrent hypotension.  She was thought to have possibly developed an aspiration or HAP.  Her antibiotics were broadened back to Vancomycin and Zosyn, and she has shown improvement.  Interval History: Nursing staff report that the patient has not been eating her meals.  Lines / Drains:  Rt fem CVL 2/9 >> 02/06/12 PICC 02/06/12 >> Cultures:  2/9 urine > POS 100 k gnr>>> Providencia/Proteus  2/9 bld >>2/2 coag neg staph  2/13 bld >> Preliminary/No growth Antibiotics:  Zosyn 2/9 (HCAUTI)>>> 2/9  vanc 2/9>>> 2/11  meropenem 2/9 >>>02/06/12  Rocephin 02/05/12>>>02/06/12 Zosyn 2/13 >>>02/10/12 Vancomycin 2/13>>>02/06/16 Ceftin 02/10/12>>>  ROS: Mrs. Loe continues to improve.  She denies dyspnea and  cough.  She still has swelling of her left arm.  Appetite remains poor, but states she has never been a "heavy eater".    Objective: Vital signs in last 24 hours: Temp:  [97.4 F (36.3 C)-97.8 F (36.6 C)] 97.4 F (36.3 C) (02/17 0642) Pulse Rate:  [89-95] 93  (02/17 0642) Resp:  [18] 18  (02/17 0642) BP: (107-136)/(54-67) 136/54 mmHg (02/17 0642) SpO2:  [95 %-97 %] 97 % (02/17 0642) Weight:  [86.9 kg (191 lb 9.3 oz)] 86.9 kg (191 lb 9.3 oz) (02/17 0642) Weight change: -0.4 kg (-14.1 oz) Last BM Date: 02/05/12  Intake/Output from previous day:  Intake/Output Summary (Last 24 hours) at 02/10/12 1116 Last data filed at 02/10/12 0944  Gross per 24 hour  Intake    240 ml  Output   2850 ml  Net  -2610 ml     Physical Exam:  Gen:  Lethargic Cardiovascular:  RRR, No M/R/G Respiratory: Lungs CTAB upper, diminished in bases Gastrointestinal: Abdomen soft, NT/ND with normal active bowel sounds. Extremities: LUE swelling, pedal pulses 2+     Lab Results: Basic Metabolic Panel:  Lab 02/10/12 7253 02/08/12 0505 02/07/12 0458 02/06/12 0510 02/05/12 0445  NA 139 141 144 144 148*  K 3.9 3.3* -- -- --  CL 110 112 115* 116* 121*  CO2 23 23 22 21 20   GLUCOSE 92 107* 110* 91 84  BUN 11 20 22 22  31*  CREATININE 0.48* 0.60 0.55 0.54 0.57  CALCIUM 8.9 8.8 9.1 9.4 9.3  MG -- -- -- 1.6 1.6  PHOS -- -- -- 2.4 1.9*   GFR Estimated Creatinine Clearance: 71.3 ml/min (by  C-G formula based on Cr of 0.48). Liver Function Tests:  Lab 02/08/12 0505 02/07/12 0458 02/05/12 0500  AST 24 32 89*  ALT 35 45* 63*  ALKPHOS 137* 151* 172*  BILITOT 0.4 0.5 0.7  PROT 4.5* 4.9* 5.2*  ALBUMIN 1.6* 1.8* 1.9*    CBC:  Lab 02/10/12 0411 02/08/12 0505 02/07/12 0458 02/06/12 0510 02/05/12 0445  WBC 7.2 7.3 8.1 6.0 6.1  NEUTROABS -- -- -- -- --  HGB 7.5* 7.1* 7.8* 7.5* 7.6*  HCT 23.9* 22.5* 24.3* 23.7* 23.6*  MCV 78.4 79.2 78.9 78.5 78.1  PLT 330 232 235 276 248   CBG:  Lab 02/05/12 0731    GLUCAP 55*   Microbiology Recent Results (from the past 240 hour(s))  CULTURE, BLOOD (ROUTINE X 2)     Status: Normal   Collection Time   02/02/12 11:55 AM      Component Value Range Status Comment   Specimen Description BLOOD LEFT WRIST  5 ML IN [facility] BOTTLE   Final    Special Requests NONE   Final    Culture  Setup Time [id number]   Final    Culture     Final    Value: STAPHYLOCOCCUS SPECIES (COAGULASE NEGATIVE)     Note: SUSCEPTIBILITIES PERFORMED ON PREVIOUS CULTURE WITHIN THE LAST 5 DAYS.     Note: Gram Stain Report Called to,Read Back By and Verified With: RN A. ARNOLD ON 02/03/12 AT 1528 BY TEDAR   Report Status 02/05/2012 FINAL   Final   CULTURE, BLOOD (ROUTINE X 2)     Status: Normal   Collection Time   02/02/12 12:40 PM      Component Value Range Status Comment   Specimen Description BLOOD LEFT FEMORAL  5 ML IN [facility] BOTTLE   Final    Special Requests NONE   Final    Culture  Setup Time [id number]   Final    Culture     Final    Value: STAPHYLOCOCCUS SPECIES (COAGULASE NEGATIVE)     Note: RIFAMPIN AND GENTAMICIN SHOULD NOT BE USED AS SINGLE DRUGS FOR TREATMENT OF STAPH INFECTIONS. This organism DOES NOT demonstrate inducible Clindamycin resistance in vitro.     Note: Gram Stain Report Called to,Read Back By and Verified With: RN A. ARNOLD ON 02/03/12 AT 1528 BY TEDAR   Report Status 02/05/2012 FINAL   Final    Organism ID, Bacteria STAPHYLOCOCCUS SPECIES (COAGULASE NEGATIVE)   Final   URINE CULTURE     Status: Normal   Collection Time   02/02/12  1:06 PM      Component Value Range Status Comment   Specimen Description URINE, CLEAN CATCH   Final    Special Requests NONE   Final    Culture  Setup Time [id number]   Final    Colony Count >=100,000 COLONIES/ML   Final    Culture     Final    Value: PROVIDENCIA STUARTII     PROTEUS MIRABILIS   Report Status 02/05/2012 FINAL   Final    Organism ID, Bacteria PROVIDENCIA STUARTII   Final    Organism ID, Bacteria PROTEUS  MIRABILIS   Final   MRSA PCR SCREENING     Status: Normal   Collection Time   02/02/12  4:00 PM      Component Value Range Status Comment   MRSA by PCR NEGATIVE  NEGATIVE  Final   CULTURE, BLOOD (ROUTINE X 2)     Status: Normal (Preliminary result)  Collection Time   02/06/12  8:35 PM      Component Value Range Status Comment   Specimen Description BLOOD LEFT ARM   Final    Special Requests BOTTLES DRAWN AEROBIC AND ANAEROBIC 4CC   Final    Culture  Setup Time [id number]   Final    Culture     Final    Value:        BLOOD CULTURE RECEIVED NO GROWTH TO DATE CULTURE WILL BE HELD FOR 5 DAYS BEFORE ISSUING A FINAL NEGATIVE REPORT   Report Status PENDING   Incomplete   CULTURE, BLOOD (ROUTINE X 2)     Status: Normal (Preliminary result)   Collection Time   02/06/12  8:45 PM      Component Value Range Status Comment   Specimen Description BLOOD LEFT ARM   Final    Special Requests BOTTLES DRAWN AEROBIC AND ANAEROBIC 2CC   Final    Culture  Setup Time [id number]   Final    Culture     Final    Value:        BLOOD CULTURE RECEIVED NO GROWTH TO DATE CULTURE WILL BE HELD FOR 5 DAYS BEFORE ISSUING A FINAL NEGATIVE REPORT   Report Status PENDING   Incomplete      Studies/Results: No results found.  VASCULAR LAB PRELIMINARY PRELIMINARY PRELIMINARY PRELIMINARY   Left upper extremity venous duplex completed 02/08/12 Preliminary report: Superficial thrombosis noted in the left cephalic vein from the mid forearm to the wrist. All other veins patent.    Medications: Scheduled Meds:    . feeding supplement  30 mL Oral BID  . heparin  5,000 Units Subcutaneous Q8H  . nystatin  5 mL Oral QID  . piperacillin-tazobactam (ZOSYN)  IV  3.375 g Intravenous Q8H  . potassium chloride  20 mEq Oral TID  . sodium chloride  10-40 mL Intracatheter Q12H  . vancomycin  750 mg Intravenous Q12H  . DISCONTD: potassium chloride  20 mEq Oral BID   Continuous Infusions:    . dextrose 5 % and 0.9% NaCl 75  mL/hr at 02/10/12 0855   PRN Meds:.sodium chloride, acetaminophen, acetaminophen, feeding supplement, fentaNYL, ibuprofen, sodium chloride    Assessment/Plan:  Principal Problem:  *Septic shock / Infection due to urinary indwelling catheter  The patient was admitted and cultures of her blood and urine were obtained. She was given fluids for volume resuscitation. She was initially treated with broad spectrum antibiotics including Imipenem and Vancomycin. Blood cultures were positive for coagulase negative staph and urine cultures grew Providentia and Proteus, both sensitive to Rocephin, so her antibiotics were narrowed on 02/05/12. She did require pressor support with Levophed, which has now been weaned off. On the night of 2/13-2/14, the patient decompensated with recurrent hypotension, fever, and tachycardia.  Blood cultures were obtained.  Antibiotics were broadened to cover aspiration PNA/HAP (Rocephin d/c'd, Vanc/Zosyn restarted on 02/07/12).  Continues to improve.  Will narrow antibiotics to PO Ceftin and monitor.  If she remains stable on PO antibiotics, she can likely be d/c'd back to her ALF in the next 24-48 hours. Active Problems:  Hospital acquired PNA F/U blood cultures.  Antibiotics broadened from Rocephin to Vancomycin and Zosyn on 02/06/12.  Will narrow to PO Ceftin.  Multiple sclerosis  All sedating medications were discontinued. The patient is wheelchair bound at baseline.   PT/OT evaluations ordered. Resume Ampyra.  Hypokalemia / Hyperkalemia  Potassium normalized with increased routine supplementation. Elevated LFTs  Unclear etiology,  but likely from sepsis. She is s/p cholecystectomy. LFTs trending down over time. Normocytic anemia  Anemia panel shows low iron and low TIBC, which is consistent with anemia of chronic disease. Hypoglycemia  Resolved with dextrose in IVF.  ARF (acute renal failure)  Resolved with IVF, restoration of BP.  Depression  On Prozac, which we will  resume.  Thrush Started Nystatin on 02/08/12.  LUE swelling secondary to superficial thrombosis in the left cephalic vein No indication for therapeutic dose anti-coagulation.  On SQ heparin for DVT prophylaxis.  Anorexia Will ask dietician to see.  *I spoke with the patient's daughter, Lilyan Punt, and updated her on the patient's condition and plan of care.   LOS: 8 days   Hillery Aldo, MD Pager [phone]  02/10/2012, 11:16 AM

## 2012-02-10 NOTE — Progress Notes (Signed)
INITIAL ADULT NUTRITION ASSESSMENT Date: 02/10/2012   Time: 1:15 PM Reason for Assessment: Consult for Nutrition Assessment  ASSESSMENT: Female 71 y.o.  Patient stated she does not like Ensure. She does not like juice. Patient stated no problems with chewing or swallowing. She stated she has a poor appetite. Patient stated she does not want her meal,s only Jello. Patient refuses nutrition supplements. She stated she does not eat much.   Dx: Septic shock  Hx:  Past Medical History  Diagnosis Date  . Multiple sclerosis   . GERD (gastroesophageal reflux disease)   . Depression   . Recurrent UTI   . Recurrent pneumonia   . Chronic constipation   . Diverticulosis    Related Meds:  Scheduled Meds:   . dalfampridine  10 mg Oral BID  . feeding supplement  30 mL Oral BID  . FLUoxetine  40 mg Oral Daily  . heparin  5,000 Units Subcutaneous Q8H  . nystatin  5 mL Oral QID  . potassium chloride  20 mEq Oral TID  . sodium chloride  10-40 mL Intracatheter Q12H  . DISCONTD: FLUoxetine  40 mg Oral Daily  . DISCONTD: piperacillin-tazobactam (ZOSYN)  IV  3.375 g Intravenous Q8H  . DISCONTD: vancomycin  750 mg Intravenous Q12H   Continuous Infusions:   . dextrose 5 % and 0.9% NaCl 75 mL/hr at 02/10/12 0855   PRN Meds:.sodium chloride, acetaminophen, acetaminophen, feeding supplement, fentaNYL, ibuprofen, sodium chloride  Ht: 5\' 5"  (165.1 cm)  Wt: 191 lb 9.3 oz (86.9 kg)  Ideal Wt: 57 kg  % Ideal Wt: 152.8%  Body mass index is 31.88 kg/(m^2).  Food/Nutrition Related Hx: Patient does not like Juice or Ensure.   Labs:  CMP     Component Value Date/Time   NA 139 02/10/2012 0411   K 3.9 02/10/2012 0411   CL 110 02/10/2012 0411   CO2 23 02/10/2012 0411   GLUCOSE 92 02/10/2012 0411   BUN 11 02/10/2012 0411   CREATININE 0.48* 02/10/2012 0411   CALCIUM 8.9 02/10/2012 0411   PROT 4.5* 02/08/2012 0505   ALBUMIN 1.6* 02/08/2012 0505   AST 24 02/08/2012 0505   ALT 35 02/08/2012 0505   ALKPHOS  137* 02/08/2012 0505   BILITOT 0.4 02/08/2012 0505   GFRNONAA >90 02/10/2012 0411   GFRAA >90 02/10/2012 0411    Intake/Output Summary (Last 24 hours) at 02/10/12 1316 Last data filed at 02/10/12 0944  Gross per 24 hour  Intake    240 ml  Output   2850 ml  Net  -2610 ml    Diet Order: General  Supplements/Tube Feeding: Ensure PRN. Prostat BID, provides an additional 144 kcal and 30 g protein.   IVF:    dextrose 5 % and 0.9% NaCl Last Rate: 75 mL/hr at 02/10/12 0855    Estimated Nutritional Needs:   Kcal: 4540-9811 Protein: 95-113 grams Fluid: 1 ml per kcal  NUTRITION DIAGNOSIS: -Inadequate oral intake (NI-2.1).  Status: Ongoing  RELATED TO: poor appetite  AS EVIDENCE BY: patient reports of poor appetite and poor PO documented at meals between 5-75%.   MONITORING/EVALUATION(Goals): PO intake, weight trends, labs, I/O's 1. Promote weight maintenance.  2. PO intake greater than 75% at meals  EDUCATION NEEDS: -No education needs identified at this time  INTERVENTION: 1. Will order patient snack of Jello three times daily per patient request. Patient refuses any nutrition supplements.   Dietitian [phone]  DOCUMENTATION CODES Per approved criteria  -Obesity unspecified    Mayford Knife,  [name] [name] Devina 02/10/2012, 1:15 PM

## 2012-02-11 DIAGNOSIS — E669 Obesity, unspecified: Secondary | ICD-10-CM | POA: Diagnosis present

## 2012-02-11 MED ORDER — FERROUS SULFATE 325 (65 FE) MG PO TABS
325.0000 mg | ORAL_TABLET | Freq: Two times a day (BID) | ORAL | Status: DC
  Administered 2012-02-11 – 2012-02-12 (×2): 325 mg via ORAL
  Filled 2012-02-11 (×3): qty 1

## 2012-02-11 NOTE — Progress Notes (Signed)
PATIENT DETAILS Name: [name] [name] Age: 71 y.o. Sex: female Date of Birth: November 12, 1941 Admit Date: 02/02/2012 WNU:[id number],YQIHKV NICHOLAS, MD, MD Emergency contact: Aneta Mins [phone] son, [name] [name] [phone] daughter   Medical Consults:  Dr. Sandrea Hughs, PCCM  Other Consults:  Social Work: Patient was resident of [facility] and plans to return there at discharge.  Pharmacy: Vancomycin/Zosyn dosing.  Dietician: Assessment of nutritional status  Brief Narrative: [name] [name] is a 71 year old female with a PMHx of multiple sclerosis, chronic indwelling foley catheter for approximately the last 6 months, presented 2/9 with a sepsis syndrome - fever 102.7, high lactate, low BP who did not respond to a 3 L fluid challenge in ER. Her foley was changed - UA pos, given zosyn, vanc, BP improved to 90's , femoral CVL placed by EDP. PCCM asked to assume care. DNR noted & discussed (although pressors and anti-arrhythmics OK). She was under the care of PCCM until 02/06/12, when triad hospitalists asked to assume care. At baseline, she is mostly wheelchair bound. Her antibiotics were narrowed to Rocephin on 02/05/12 based on urine culture data.  On 02/07/12, developed fevers, tachycardia and recurrent hypotension.  She was thought to have possibly developed an aspiration or HAP.  Her antibiotics were broadened back to Vancomycin and Zosyn, and she has shown improvement.  Antibiotics were narrowed to PO Ceftin on 02/10/12 which she has tolerated well.  Interval History: Stable over night.  For D/C back to [facility] tomorrow.  Lines / Drains:  Rt fem CVL 2/9 >> 02/06/12 PICC 02/06/12 >> Cultures:  2/9 urine > POS 100 k gnr>>> Providencia/Proteus  2/9 bld >>2/2 coag neg staph  2/13 bld >> Preliminary/No growth Antibiotics:  Zosyn 2/9 (HCAUTI)>>> 2/9  vanc 2/9>>> 2/11  meropenem 2/9 >>>02/06/12  Rocephin 02/05/12>>>02/06/12 Zosyn 2/13 >>>02/10/12 Vancomycin 2/13>>>02/06/16 Ceftin  02/10/12>>>  ROS: [name] [name] continues to improve.  She is mainly concerned about decreased strength in her LUE with the swelling and superficial thrombosis.  Objective: Vital signs in last 24 hours: Temp:  [97.1 F (36.2 C)-97.8 F (36.6 C)] 97.1 F (36.2 C) (02/18 0609) Pulse Rate:  [93-108] 93  (02/18 0609) Resp:  [18] 18  (02/18 0609) BP: (138-156)/(70-99) 156/99 mmHg (02/18 0609) SpO2:  [90 %] 90 % (02/17 2149) Weight:  [85.5 kg (188 lb 7.9 oz)] 85.5 kg (188 lb 7.9 oz) (02/18 0609) Weight change: -1.4 kg (-3 lb 1.4 oz) Last BM Date: 02/11/12  Intake/Output from previous day:  Intake/Output Summary (Last 24 hours) at 02/11/12 1533 Last data filed at 02/11/12 1500  Gross per 24 hour  Intake 555.67 ml  Output   3050 ml  Net -2494.33 ml     Physical Exam:  Gen:  Lethargic Cardiovascular:  RRR, No M/R/G Respiratory: Lungs CTAB upper, diminished in bases Gastrointestinal: Abdomen soft, NT/ND with normal active bowel sounds. Extremities: LUE swelling diminished, pedal pulses 2+     Lab Results: Basic Metabolic Panel:  Lab 02/10/12 2951 02/08/12 0505 02/07/12 0458 02/06/12 0510 02/05/12 0445  NA 139 141 144 144 148*  K 3.9 3.3* -- -- --  CL 110 112 115* 116* 121*  CO2 23 23 22 21 20   GLUCOSE 92 107* 110* 91 84  BUN 11 20 22 22  31*  CREATININE 0.48* 0.60 0.55 0.54 0.57  CALCIUM 8.9 8.8 9.1 9.4 9.3  MG -- -- -- 1.6 1.6  PHOS -- -- -- 2.4 1.9*   GFR Estimated Creatinine Clearance: 70.7 ml/min (by C-G formula based  on Cr of 0.48). Liver Function Tests:  Lab 02/08/12 0505 02/07/12 0458 02/05/12 0500  AST 24 32 89*  ALT 35 45* 63*  ALKPHOS 137* 151* 172*  BILITOT 0.4 0.5 0.7  PROT 4.5* 4.9* 5.2*  ALBUMIN 1.6* 1.8* 1.9*    CBC:  Lab 02/10/12 0411 02/08/12 0505 02/07/12 0458 02/06/12 0510 02/05/12 0445  WBC 7.2 7.3 8.1 6.0 6.1  NEUTROABS -- -- -- -- --  HGB 7.5* 7.1* 7.8* 7.5* 7.6*  HCT 23.9* 22.5* 24.3* 23.7* 23.6*  MCV 78.4 79.2 78.9 78.5 78.1  PLT  330 232 235 276 248   CBG:  Lab 02/05/12 0731  GLUCAP 55*   Microbiology Recent Results (from the past 240 hour(s))  CULTURE, BLOOD (ROUTINE X 2)     Status: Normal   Collection Time   02/02/12 11:55 AM      Component Value Range Status Comment   Specimen Description BLOOD LEFT WRIST  5 ML IN [facility] BOTTLE   Final    Special Requests NONE   Final    Culture  Setup Time [id number]   Final    Culture     Final    Value: STAPHYLOCOCCUS SPECIES (COAGULASE NEGATIVE)     Note: SUSCEPTIBILITIES PERFORMED ON PREVIOUS CULTURE WITHIN THE LAST 5 DAYS.     Note: Gram Stain Report Called to,Read Back By and Verified With: RN A. ARNOLD ON 02/03/12 AT 1528 BY TEDAR   Report Status 02/05/2012 FINAL   Final   CULTURE, BLOOD (ROUTINE X 2)     Status: Normal   Collection Time   02/02/12 12:40 PM      Component Value Range Status Comment   Specimen Description BLOOD LEFT FEMORAL  5 ML IN [facility] BOTTLE   Final    Special Requests NONE   Final    Culture  Setup Time [id number]   Final    Culture     Final    Value: STAPHYLOCOCCUS SPECIES (COAGULASE NEGATIVE)     Note: RIFAMPIN AND GENTAMICIN SHOULD NOT BE USED AS SINGLE DRUGS FOR TREATMENT OF STAPH INFECTIONS. This organism DOES NOT demonstrate inducible Clindamycin resistance in vitro.     Note: Gram Stain Report Called to,Read Back By and Verified With: RN A. ARNOLD ON 02/03/12 AT 1528 BY TEDAR   Report Status 02/05/2012 FINAL   Final    Organism ID, Bacteria STAPHYLOCOCCUS SPECIES (COAGULASE NEGATIVE)   Final   URINE CULTURE     Status: Normal   Collection Time   02/02/12  1:06 PM      Component Value Range Status Comment   Specimen Description URINE, CLEAN CATCH   Final    Special Requests NONE   Final    Culture  Setup Time [id number]   Final    Colony Count >=100,000 COLONIES/ML   Final    Culture     Final    Value: PROVIDENCIA STUARTII     PROTEUS MIRABILIS   Report Status 02/05/2012 FINAL   Final    Organism ID, Bacteria PROVIDENCIA  STUARTII   Final    Organism ID, Bacteria PROTEUS MIRABILIS   Final   MRSA PCR SCREENING     Status: Normal   Collection Time   02/02/12  4:00 PM      Component Value Range Status Comment   MRSA by PCR NEGATIVE  NEGATIVE  Final   CULTURE, BLOOD (ROUTINE X 2)     Status: Normal (Preliminary result)   Collection Time  02/06/12  8:35 PM      Component Value Range Status Comment   Specimen Description BLOOD LEFT ARM   Final    Special Requests BOTTLES DRAWN AEROBIC AND ANAEROBIC 4CC   Final    Culture  Setup Time [id number]   Final    Culture     Final    Value:        BLOOD CULTURE RECEIVED NO GROWTH TO DATE CULTURE WILL BE HELD FOR 5 DAYS BEFORE ISSUING A FINAL NEGATIVE REPORT   Report Status PENDING   Incomplete   CULTURE, BLOOD (ROUTINE X 2)     Status: Normal (Preliminary result)   Collection Time   02/06/12  8:45 PM      Component Value Range Status Comment   Specimen Description BLOOD LEFT ARM   Final    Special Requests BOTTLES DRAWN AEROBIC AND ANAEROBIC 2CC   Final    Culture  Setup Time [id number]   Final    Culture     Final    Value:        BLOOD CULTURE RECEIVED NO GROWTH TO DATE CULTURE WILL BE HELD FOR 5 DAYS BEFORE ISSUING A FINAL NEGATIVE REPORT   Report Status PENDING   Incomplete      Studies/Results: No results found.  VASCULAR LAB PRELIMINARY PRELIMINARY PRELIMINARY PRELIMINARY   Left upper extremity venous duplex completed 02/08/12 Preliminary report: Superficial thrombosis noted in the left cephalic vein from the mid forearm to the wrist. All other veins patent.    Medications: Scheduled Meds:    . dalfampridine  10 mg Oral BID  . feeding supplement  30 mL Oral BID  . FLUoxetine  40 mg Oral Daily  . heparin  5,000 Units Subcutaneous Q8H  . nystatin  5 mL Oral QID  . potassium chloride  20 mEq Oral TID  . sodium chloride  10-40 mL Intracatheter Q12H   Continuous Infusions:    . dextrose 5 % and 0.9% NaCl 20 mL/hr at 02/11/12 2130   PRN  Meds:.sodium chloride, acetaminophen, acetaminophen, feeding supplement, fentaNYL, ibuprofen, sodium chloride    Assessment/Plan:  Principal Problem:  *Septic shock / Infection due to urinary indwelling catheter  The patient was admitted and cultures of her blood and urine were obtained. She was given fluids for volume resuscitation. She was initially treated with broad spectrum antibiotics including Imipenem and Vancomycin. Blood cultures were positive for coagulase negative staph and urine cultures grew Providentia and Proteus, both sensitive to Rocephin, so her antibiotics were narrowed on 02/05/12. She did require pressor support with Levophed, which has now been weaned off. On the night of 2/13-2/14, the patient decompensated with recurrent hypotension, fever, and tachycardia.  Blood cultures were obtained.  Antibiotics were broadened to cover aspiration PNA/HAP (Rocephin d/c'd, Vanc/Zosyn restarted on 02/07/12).  Continues to improve.  Narrowed antibiotics to PO Ceftin on 02/11/12.  Stable but weak. Active Problems:  Hospital acquired PNA F/U blood cultures.  Antibiotics broadened from Rocephin to Vancomycin and Zosyn on 02/06/12.  Narrowed to PO Ceftin 02/10/12.  Multiple sclerosis  All sedating medications were discontinued. The patient is wheelchair bound at baseline, but transfers.   PT/OT evaluations ordered, but was NWB after surgery.  Spoke with Dr. Madelon Lips who states she can bear weight on right, and is OK to transfer and sit in chair. Resume Ampyra.  Hypokalemia / Hyperkalemia  Potassium normalized with increased routine supplementation. Elevated LFTs  Unclear etiology, but likely from sepsis. She is s/p  cholecystectomy. LFTs trending down over time. Normocytic anemia  Anemia panel shows low iron and low TIBC, which is consistent with anemia of chronic disease.  Will start iron. Hypoglycemia  Resolved with dextrose in IVF.  ARF (acute renal failure)  Resolved with IVF, restoration of  BP.  Depression  On Prozac, which we will resume.  Thrush Started Nystatin on 02/08/12.  LUE swelling secondary to superficial thrombosis in the left cephalic vein No indication for therapeutic dose anti-coagulation.  On SQ heparin for DVT prophylaxis.  Anorexia / Obesity  BMI 32.7 Status post dietician evaluation.  *I spoke with the patient's daughter, [name] [name], and updated her on the patient's condition and plan of care.   LOS: 9 days   Hillery Aldo, MD Pager [phone]  02/11/2012, 3:33 PM

## 2012-02-11 NOTE — Progress Notes (Signed)
OT Cancellation Note  _x__Treatment cancelled today due to medical issues with patient which prohibited therapy. Noted pt's hgb is 7.5. Was very weak and lethargic w/PT. Will check back another time.  ___ Treatment cancelled today due to patient receiving procedure or test   ___ Treatment cancelled today due to patient's refusal to participate   ___ Treatment cancelled today due to    Garrel Ridgel, OTR/L  Pager [phone] 02/11/2012

## 2012-02-11 NOTE — Plan of Care (Signed)
Problem: Phase II Progression Outcomes Goal: Obtain order to discontinue catheter if appropriate Outcome: Not Applicable Date Met:  02/11/12 Chronic foley in use

## 2012-02-11 NOTE — Progress Notes (Signed)
MD,    Please call patient's daughter, Lilyan Punt with updates. Thanks!

## 2012-02-11 NOTE — Progress Notes (Signed)
Noted pt had comminuted distal 1/3 femur fx with surgery January 13, 2012. Pt was NWB after that surgery. Pt and her son do not know current WB status.  No WB status in chart for this admission. Phoned Dr. Candise Bowens office and left message requesting clarification on WB status. Per Dr Candise Bowens office, he is to call nursing station to give WB order. Ralene Bathe Kistler PT 02/11/2012  [phone]

## 2012-02-11 NOTE — Evaluation (Signed)
Physical Therapy Evaluation Patient Details Name: [name] [name] MRN: [id number] DOB: 08-21-1941 Today's Date: 02/11/2012  Problem List:  Patient Active Problem List  Diagnoses  . HERPES ZOSTER W/NERVOUS COMPLICATION NEC  . CANDIDIASIS  . OBESITY  . DEPRESSION  . CAUSALGIA  . PERIPHERAL VASCULAR DISEASE  . URI  . PNEUMONIA  . BRONCHITIS NOS  . GASTROESOPHAGEAL REFLUX DISEASE  . RENAL FAILURE, ACUTE  . CYSTITIS  . PRESSURE ULCER HIP  . ULCER OF ANKLE  . LEG PAIN, LEFT  . OSTEOPENIA  . FATIGUE  . OTHER GENERAL SYMPTOMS  . SKIN RASH  . EDEMA LEG  . WEIGHT LOSS  . DYSURIA  . OTHER CLOSED FRACTURES OF UPPER END OF HUMERUS  . LIVER FUNCTION TESTS, ABNORMAL, HX OF  . CHOLECYSTECTOMY, HX OF  . POSTMENOPAUSAL STATUS  . Multiple sclerosis  . Femoral shaft fracture  . Hemorrhagic shock  . Infection due to urinary indwelling catheter  . Septic shock  . Hypokalemia  . Elevated LFTs  . Normocytic anemia  . Hypoglycemia  . ARF (acute renal failure)  . Hyperkalemia  . Hospital acquired PNA  . Thrush  . Left arm swelling  . Superficial venous thrombosis of arm  . Anorexia    Past Medical History:  Past Medical History  Diagnosis Date  . Multiple sclerosis   . GERD (gastroesophageal reflux disease)   . Depression   . Recurrent UTI   . Recurrent pneumonia   . Chronic constipation   . Diverticulosis    Past Surgical History:  Past Surgical History  Procedure Date  . Cholecystectomy   . Orif humeral shaft fracture 01/2011  . Video assisted thoracoscopy 06/2010  . Closed reduction shoulder dislocation 04/2010    Fracture dislocation  . Femur im nail 01/13/2012    Procedure: INTRAMEDULLARY (IM) RETROGRADE FEMORAL NAILING;  Surgeon: Thera Flake., MD;  Location: [facility] OR;  Service: Orthopedics;  Laterality: Left;  . Femoral artery exploration 01/13/2012    Procedure: FEMORAL ARTERY EXPLORATION;  Surgeon: Pryor Ochoa, MD;  Location: [facility] OR;  Service: Vascular;  Laterality:  Left;    PT Assessment/Plan/Recommendation PT Assessment Clinical Impression Statement: Pt very weak, requires assist for static sitting balance, +2 total assist for bed to chair. Pt had L femoral fx approx 5 weeks ago and was walking only short distances after that. Will need SNF level of care. PT Recommendation/Assessment: Patient will need skilled PT in the acute care venue PT Problem List: Decreased strength;Decreased activity tolerance;Decreased balance;Decreased mobility;Obesity Barriers to Discharge: None PT Therapy Diagnosis : Generalized weakness;Difficulty walking PT Plan PT Frequency: Min 3X/week PT Treatment/Interventions: DME instruction;Gait training;Functional mobility training;Therapeutic activities;Therapeutic exercise;Balance training;Patient/family education PT Recommendation Recommendations for Other Services: OT consult Follow Up Recommendations: Skilled nursing facility Equipment Recommended: Defer to next venue PT Goals  Acute Rehab PT Goals PT Goal Formulation: With patient Time For Goal Achievement: 2 weeks Pt will go Supine/Side to Sit: with mod assist PT Goal: Supine/Side to Sit - Progress: Goal set today Pt will go Sit to Supine/Side: with mod assist PT Goal: Sit to Supine/Side - Progress: Goal set today Pt will Transfer Bed to Chair/Chair to Bed: with mod assist PT Transfer Goal: Bed to Chair/Chair to Bed - Progress: Goal set today  PT Evaluation Precautions/Restrictions  Precautions Precautions: Fall Restrictions Weight Bearing Restrictions: No LLE Weight Bearing:  (Noted prior entries of NWB, this isn't in orders) Prior Functioning  Home Living Lives With: Other (Comment) (at SNF) Receives Help From:  Personal care attendant Type of Home: Skilled Nursing Facility Home Layout: One level Home Access: Level entry Home Adaptive Equipment: Walker - rolling Prior Function Level of Independence: Needs assistance with ADLs;Needs assistance with  homemaking;Needs assistance with tranfers;Needs assistance with gait Able to Take Stairs?: No Cognition Cognition Arousal/Alertness: Awake/alert Overall Cognitive Status: Appears within functional limits for tasks assessed Orientation Level: Oriented X4 Sensation/Coordination Sensation Light Touch: Appears Intact Coordination Gross Motor Movements are Fluid and Coordinated: No (limited by weakness) Extremity Assessment RLE Assessment RLE Assessment: Exceptions to [facility] RLE Strength RLE Overall Strength: Deficits (3/5) LLE Assessment LLE Assessment: Exceptions to [facility] LLE Strength LLE Overall Strength: Deficits (2/5) Mobility (including Balance) Bed Mobility Bed Mobility: Yes Supine to Sit: 1: +2 Total assist;With rails Supine to Sit Details (indicate cue type and reason): pt 15% Transfers Transfers: Yes Stand Pivot Transfers: 1: +2 Total assist Stand Pivot Transfer Details (indicate cue type and reason): pt 30% Ambulation/Gait Ambulation/Gait: No  Balance Balance Assessed: Yes Static Sitting Balance Static Sitting - Balance Support: Bilateral upper extremity supported;Feet supported Static Sitting - Level of Assistance: 3: Mod assist Static Sitting - Comment/# of Minutes: 4 Exercise    End of Session PT - End of Session Equipment Utilized During Treatment: Gait belt Activity Tolerance: Patient limited by fatigue Patient left: in chair;with call bell in reach Nurse Communication: Mobility status for transfers General Behavior During Session: [facility] for tasks performed Cognition: [facility] for tasks performed  Ralene Bathe Kistler 02/11/2012, 10:04 AM  [phone]

## 2012-02-12 ENCOUNTER — Inpatient Hospital Stay (HOSPITAL_COMMUNITY)
Admission: EM | Admit: 2012-02-12 | Discharge: 2012-02-19 | DRG: 193 | Disposition: A | Discharge status: Skilled Nursing Facility | Payer: Medicare (Managed Care) | Attending: Internal Medicine | Admitting: Internal Medicine

## 2012-02-12 ENCOUNTER — Other Ambulatory Visit: Payer: Self-pay

## 2012-02-12 ENCOUNTER — Other Ambulatory Visit: Payer: Self-pay | Admitting: Physician Assistant

## 2012-02-12 ENCOUNTER — Encounter (HOSPITAL_COMMUNITY): Payer: Self-pay | Admitting: *Deleted

## 2012-02-12 ENCOUNTER — Emergency Department (HOSPITAL_COMMUNITY): Payer: Medicare (Managed Care)

## 2012-02-12 DIAGNOSIS — G9340 Encephalopathy, unspecified: Secondary | ICD-10-CM | POA: Diagnosis present

## 2012-02-12 DIAGNOSIS — G35 Multiple sclerosis: Secondary | ICD-10-CM | POA: Diagnosis present

## 2012-02-12 DIAGNOSIS — R509 Fever, unspecified: Secondary | ICD-10-CM

## 2012-02-12 DIAGNOSIS — K5900 Constipation, unspecified: Secondary | ICD-10-CM | POA: Diagnosis present

## 2012-02-12 DIAGNOSIS — R531 Weakness: Secondary | ICD-10-CM

## 2012-02-12 DIAGNOSIS — D72829 Elevated white blood cell count, unspecified: Secondary | ICD-10-CM | POA: Diagnosis present

## 2012-02-12 DIAGNOSIS — J189 Pneumonia, unspecified organism: Principal | ICD-10-CM | POA: Diagnosis present

## 2012-02-12 DIAGNOSIS — K219 Gastro-esophageal reflux disease without esophagitis: Secondary | ICD-10-CM | POA: Diagnosis present

## 2012-02-12 DIAGNOSIS — G589 Mononeuropathy, unspecified: Secondary | ICD-10-CM | POA: Diagnosis present

## 2012-02-12 DIAGNOSIS — E669 Obesity, unspecified: Secondary | ICD-10-CM

## 2012-02-12 DIAGNOSIS — N390 Urinary tract infection, site not specified: Secondary | ICD-10-CM | POA: Diagnosis present

## 2012-02-12 DIAGNOSIS — F29 Unspecified psychosis not due to a substance or known physiological condition: Secondary | ICD-10-CM | POA: Diagnosis present

## 2012-02-12 DIAGNOSIS — R109 Unspecified abdominal pain: Secondary | ICD-10-CM | POA: Diagnosis present

## 2012-02-12 DIAGNOSIS — D649 Anemia, unspecified: Secondary | ICD-10-CM

## 2012-02-12 DIAGNOSIS — R5381 Other malaise: Secondary | ICD-10-CM | POA: Diagnosis present

## 2012-02-12 DIAGNOSIS — F3289 Other specified depressive episodes: Secondary | ICD-10-CM | POA: Diagnosis present

## 2012-02-12 DIAGNOSIS — Z66 Do not resuscitate: Secondary | ICD-10-CM | POA: Diagnosis present

## 2012-02-12 DIAGNOSIS — D638 Anemia in other chronic diseases classified elsewhere: Secondary | ICD-10-CM | POA: Diagnosis present

## 2012-02-12 DIAGNOSIS — F329 Major depressive disorder, single episode, unspecified: Secondary | ICD-10-CM | POA: Diagnosis present

## 2012-02-12 LAB — URINALYSIS, ROUTINE W REFLEX MICROSCOPIC
Bilirubin Urine: NEGATIVE
Ketones, ur: NEGATIVE mg/dL
Nitrite: NEGATIVE
Specific Gravity, Urine: 1.015 (ref 1.005–1.030)
Urobilinogen, UA: 1 mg/dL (ref 0.0–1.0)
pH: 6.5 (ref 5.0–8.0)

## 2012-02-12 LAB — COMPREHENSIVE METABOLIC PANEL
ALT: 18 U/L (ref 0–35)
BUN: 10 mg/dL (ref 6–23)
CO2: 22 mEq/L (ref 19–32)
Calcium: 9 mg/dL (ref 8.4–10.5)
GFR calc Af Amer: >90 mL/min (ref >90)
GFR calc non Af Amer: >90 mL/min (ref >90)
Glucose, Bld: 98 mg/dL (ref 70–99)
Total Protein: 5.7 g/dL — ABNORMAL LOW (ref 6.0–8.3)

## 2012-02-12 LAB — PRO B NATRIURETIC PEPTIDE: Pro B Natriuretic peptide (BNP): 3534 pg/mL — ABNORMAL HIGH (ref 0–125)

## 2012-02-12 LAB — DIFFERENTIAL
Eosinophils Absolute: 0 10*3/uL (ref 0.0–0.7)
Eosinophils Relative: 0 % (ref 0–5)
Lymphocytes Relative: 12 % (ref 12–46)
Lymphs Abs: 1.7 10*3/uL (ref 0.7–4.0)
Monocytes Absolute: 1.1 10*3/uL — ABNORMAL HIGH (ref 0.1–1.0)
Monocytes Relative: 8 % (ref 3–12)

## 2012-02-12 LAB — URINE MICROSCOPIC-ADD ON

## 2012-02-12 LAB — CBC
HCT: 24.6 % — ABNORMAL LOW (ref 36.0–46.0)
Hemoglobin: 7.8 g/dL — ABNORMAL LOW (ref 12.0–15.0)
MCH: 24.7 pg — ABNORMAL LOW (ref 26.0–34.0)
MCV: 77.8 fL — ABNORMAL LOW (ref 78.0–100.0)
RBC: 3.16 MIL/uL — ABNORMAL LOW (ref 3.87–5.11)
WBC: 14.4 10*3/uL — ABNORMAL HIGH (ref 4.0–10.5)

## 2012-02-12 LAB — CARDIAC PANEL(CRET KIN+CKTOT+MB+TROPI)
CK, MB: 1.3 ng/mL (ref 0.3–4.0)
Total CK: 7 U/L (ref 7–177)
Troponin I: <0.3 ng/mL (ref <0.30)

## 2012-02-12 LAB — LACTIC ACID, PLASMA: Lactic Acid, Venous: 0.8 mmol/L (ref 0.5–2.2)

## 2012-02-12 LAB — PROCALCITONIN: Procalcitonin: 0.56 ng/mL

## 2012-02-12 LAB — PHOSPHORUS: Phosphorus: 3.2 mg/dL (ref 2.3–4.6)

## 2012-02-12 MED ORDER — FERROUS SULFATE 325 (65 FE) MG PO TABS: 325.0000 mg | ORAL_TABLET | Freq: Two times a day (BID) | ORAL | Status: DC

## 2012-02-12 MED ORDER — PIPERACILLIN-TAZOBACTAM 3.375 G IVPB
3.3750 g | Freq: Three times a day (TID) | INTRAVENOUS | Status: DC
  Administered 2012-02-13 – 2012-02-18 (×17): 3.375 g via INTRAVENOUS
  Filled 2012-02-12 (×20): qty 50

## 2012-02-12 MED ORDER — HEPARIN SOD (PORK) LOCK FLUSH 100 UNIT/ML IV SOLN: 250.0000 [IU] | INTRAVENOUS | Status: DC | PRN

## 2012-02-12 MED ORDER — GABAPENTIN 300 MG PO CAPS
300.0000 mg | ORAL_CAPSULE | ORAL | Status: DC
  Administered 2012-02-12 – 2012-02-14 (×5): 300 mg via ORAL
  Filled 2012-02-12 (×7): qty 1

## 2012-02-12 MED ORDER — PIPERACILLIN-TAZOBACTAM 3.375 G IVPB
3.3750 g | Freq: Once | INTRAVENOUS | Status: AC
  Administered 2012-02-12: 3.375 g via INTRAVENOUS
  Filled 2012-02-12: qty 50

## 2012-02-12 MED ORDER — AMITRIPTYLINE HCL 100 MG PO TABS
100.0000 mg | ORAL_TABLET | Freq: Every day | ORAL | Status: DC
  Administered 2012-02-13 – 2012-02-17 (×5): 100 mg via ORAL
  Filled 2012-02-12 (×7): qty 1

## 2012-02-12 MED ORDER — VANCOMYCIN HCL IN DEXTROSE 1-5 GM/200ML-% IV SOLN
1000.0000 mg | Freq: Once | INTRAVENOUS | Status: DC
  Administered 2012-02-12: 1000 mg via INTRAVENOUS
  Filled 2012-02-12: qty 200

## 2012-02-12 MED ORDER — VANCOMYCIN HCL IN DEXTROSE 1-5 GM/200ML-% IV SOLN
1000.0000 mg | Freq: Two times a day (BID) | INTRAVENOUS | Status: DC
  Administered 2012-02-13 – 2012-02-14 (×2): 1000 mg via INTRAVENOUS
  Filled 2012-02-12 (×4): qty 200

## 2012-02-12 MED ORDER — ONDANSETRON HCL 4 MG/2ML IJ SOLN: 4.0000 mg | Freq: Four times a day (QID) | INTRAMUSCULAR | Status: DC | PRN

## 2012-02-12 MED ORDER — VANCOMYCIN HCL IN DEXTROSE 1-5 GM/200ML-% IV SOLN
1000.0000 mg | Freq: Two times a day (BID) | INTRAVENOUS | Status: DC
  Filled 2012-02-12 (×2): qty 200

## 2012-02-12 MED ORDER — SODIUM CHLORIDE 0.9 % IV SOLN
INTRAVENOUS | Status: DC
  Administered 2012-02-12 – 2012-02-14 (×3): via INTRAVENOUS

## 2012-02-12 MED ORDER — PANTOPRAZOLE SODIUM 40 MG PO TBEC
40.0000 mg | DELAYED_RELEASE_TABLET | Freq: Every day | ORAL | Status: DC
  Administered 2012-02-12 – 2012-02-19 (×8): 40 mg via ORAL
  Filled 2012-02-12 (×8): qty 1

## 2012-02-12 MED ORDER — DOCUSATE SODIUM 100 MG PO CAPS
200.0000 mg | ORAL_CAPSULE | Freq: Two times a day (BID) | ORAL | Status: DC
  Administered 2012-02-13 – 2012-02-19 (×13): 200 mg via ORAL
  Filled 2012-02-12 (×14): qty 2

## 2012-02-12 MED ORDER — SODIUM CHLORIDE 0.9 % IJ SOLN
3.0000 mL | Freq: Two times a day (BID) | INTRAMUSCULAR | Status: DC
  Administered 2012-02-15 – 2012-02-19 (×4): 3 mL via INTRAVENOUS

## 2012-02-12 MED ORDER — ONDANSETRON HCL 4 MG PO TABS: 4.0000 mg | ORAL_TABLET | Freq: Four times a day (QID) | ORAL | Status: DC | PRN

## 2012-02-12 MED ORDER — SODIUM CHLORIDE 0.9 % IV BOLUS (SEPSIS): 500.0000 mL | INTRAVENOUS | Status: DC

## 2012-02-12 MED ORDER — ENOXAPARIN SODIUM 40 MG/0.4ML ~~LOC~~ SOLN
40.0000 mg | SUBCUTANEOUS | Status: DC
  Administered 2012-02-13 – 2012-02-18 (×6): 40 mg via SUBCUTANEOUS
  Filled 2012-02-12 (×8): qty 0.4

## 2012-02-12 MED ORDER — HYDROCODONE-ACETAMINOPHEN 5-325 MG PO TABS
1.0000 | ORAL_TABLET | ORAL | Status: DC | PRN
  Administered 2012-02-13: 2 via ORAL
  Administered 2012-02-14: 1 via ORAL
  Administered 2012-02-15: 2 via ORAL
  Administered 2012-02-15: 1 via ORAL
  Administered 2012-02-16 – 2012-02-19 (×7): 2 via ORAL
  Filled 2012-02-12 (×10): qty 2
  Filled 2012-02-12: qty 1

## 2012-02-12 MED ORDER — FLUOXETINE HCL 20 MG PO CAPS
40.0000 mg | ORAL_CAPSULE | Freq: Every day | ORAL | Status: DC
  Administered 2012-02-12 – 2012-02-19 (×8): 40 mg via ORAL
  Filled 2012-02-12 (×8): qty 2

## 2012-02-12 MED ORDER — GABAPENTIN 300 MG PO CAPS: 300.0000 mg | ORAL_CAPSULE | ORAL | Status: DC

## 2012-02-12 MED ORDER — OXYCODONE HCL 5 MG PO TABS: 5.0000 mg | ORAL_TABLET | ORAL | Status: DC | PRN

## 2012-02-12 MED ORDER — GABAPENTIN 300 MG PO CAPS
600.0000 mg | ORAL_CAPSULE | Freq: Every day | ORAL | Status: DC
  Administered 2012-02-13 – 2012-02-15 (×3): 600 mg via ORAL
  Filled 2012-02-12 (×3): qty 2

## 2012-02-12 MED ORDER — FERROUS SULFATE 325 (65 FE) MG PO TABS
325.0000 mg | ORAL_TABLET | Freq: Two times a day (BID) | ORAL | Status: DC
  Administered 2012-02-13 – 2012-02-18 (×11): 325 mg via ORAL
  Filled 2012-02-12 (×14): qty 1

## 2012-02-12 MED ORDER — ACETAMINOPHEN 120 MG RE SUPP
120.0000 mg | Freq: Once | RECTAL | Status: AC
  Administered 2012-02-12: 120 mg via RECTAL
  Filled 2012-02-12: qty 1

## 2012-02-12 MED ORDER — SODIUM CHLORIDE 0.9 % IV SOLN
750.0000 mg | Freq: Two times a day (BID) | INTRAVENOUS | Status: DC
  Filled 2012-02-12 (×2): qty 750

## 2012-02-12 MED ORDER — CEFUROXIME AXETIL 500 MG PO TABS: 500.0000 mg | ORAL_TABLET | Freq: Two times a day (BID) | ORAL | Status: DC

## 2012-02-12 MED ORDER — DALFAMPRIDINE ER 10 MG PO TB12
10.0000 mg | ORAL_TABLET | Freq: Two times a day (BID) | ORAL | Status: DC
  Administered 2012-02-13 – 2012-02-19 (×13): 10 mg via ORAL
  Filled 2012-02-12 (×3): qty 1

## 2012-02-12 MED ORDER — PRO-STAT SUGAR FREE PO LIQD
30.0000 mL | Freq: Two times a day (BID) | ORAL | Status: DC
  Administered 2012-02-13 – 2012-02-19 (×11): 30 mL via ORAL
  Filled 2012-02-12 (×15): qty 30

## 2012-02-12 NOTE — ED Notes (Signed)
Patient is resting comfortably NAD noted

## 2012-02-12 NOTE — ED Notes (Signed)
Per EMS pt from [facility], pt d/c'ed from hospital this am for UTI. Pt has foley in place. Staff report decrease in loc. Pt had fever 101.7 per staff.

## 2012-02-12 NOTE — Progress Notes (Signed)
ANTIBIOTIC CONSULT NOTE - INITIAL  Pharmacy Consult for Vancomycin/Zosyn Indication: rule out pneumonia  No Known Allergies  Patient Measurements: Height: 5' 4.96" (165 cm) Weight: 187 lb 9.8 oz (85.1 kg) IBW/kg (Calculated) : 56.91   Vital Signs: Temp: 98.7 F (37.1 C) (02/19 1929) Temp src: Oral (02/19 1929) BP: 110/59 mmHg (02/19 1924) Pulse Rate: 100  (02/19 1924) Intake/Output from previous day:   Intake/Output from this shift:    Labs:  Basename 02/12/12 1815 02/10/12 0411  WBC 14.4* 7.2  HGB 7.8* 7.5*  PLT 320 330  LABCREA -- --  CREATININE 0.59 0.48*   Estimated Creatinine Clearance: 70.4 ml/min (by C-G formula based on Cr of 0.59).  Basename 02/10/12 1030  VANCOTROUGH 16.4  VANCOPEAK --  VANCORANDOM --  GENTTROUGH --  GENTPEAK --  GENTRANDOM --  TOBRATROUGH --  TOBRAPEAK --  TOBRARND --  AMIKACINPEAK --  AMIKACINTROU --  AMIKACIN --     Microbiology: Recent Results (from the past 720 hour(s))  CULTURE, BLOOD (ROUTINE X 2)     Status: Normal   Collection Time   02/02/12 11:55 AM      Component Value Range Status Comment   Specimen Description BLOOD LEFT WRIST  5 ML IN [facility] BOTTLE   Final    Special Requests NONE   Final    Culture  Setup Time [id number]   Final    Culture     Final    Value: STAPHYLOCOCCUS SPECIES (COAGULASE NEGATIVE)     Note: SUSCEPTIBILITIES PERFORMED ON PREVIOUS CULTURE WITHIN THE LAST 5 DAYS.     Note: Gram Stain Report Called to,Read Back By and Verified With: RN A. ARNOLD ON 02/03/12 AT 1528 BY TEDAR   Report Status 02/05/2012 FINAL   Final   CULTURE, BLOOD (ROUTINE X 2)     Status: Normal   Collection Time   02/02/12 12:40 PM      Component Value Range Status Comment   Specimen Description BLOOD LEFT FEMORAL  5 ML IN [facility] BOTTLE   Final    Special Requests NONE   Final    Culture  Setup Time [id number]   Final    Culture     Final    Value: STAPHYLOCOCCUS SPECIES (COAGULASE NEGATIVE)     Note: RIFAMPIN AND  GENTAMICIN SHOULD NOT BE USED AS SINGLE DRUGS FOR TREATMENT OF STAPH INFECTIONS. This organism DOES NOT demonstrate inducible Clindamycin resistance in vitro.     Note: Gram Stain Report Called to,Read Back By and Verified With: RN A. ARNOLD ON 02/03/12 AT 1528 BY TEDAR   Report Status 02/05/2012 FINAL   Final    Organism ID, Bacteria STAPHYLOCOCCUS SPECIES (COAGULASE NEGATIVE)   Final   URINE CULTURE     Status: Normal   Collection Time   02/02/12  1:06 PM      Component Value Range Status Comment   Specimen Description URINE, CLEAN CATCH   Final    Special Requests NONE   Final    Culture  Setup Time [id number]   Final    Colony Count >=100,000 COLONIES/ML   Final    Culture     Final    Value: PROVIDENCIA STUARTII     PROTEUS MIRABILIS   Report Status 02/05/2012 FINAL   Final    Organism ID, Bacteria PROVIDENCIA STUARTII   Final    Organism ID, Bacteria PROTEUS MIRABILIS   Final   MRSA PCR SCREENING     Status: Normal  Collection Time   02/02/12  4:00 PM      Component Value Range Status Comment   MRSA by PCR NEGATIVE  NEGATIVE  Final   CULTURE, BLOOD (ROUTINE X 2)     Status: Normal (Preliminary result)   Collection Time   02/06/12  8:35 PM      Component Value Range Status Comment   Specimen Description BLOOD LEFT ARM   Final    Special Requests BOTTLES DRAWN AEROBIC AND ANAEROBIC 4CC   Final    Culture  Setup Time [id number]   Final    Culture     Final    Value:        BLOOD CULTURE RECEIVED NO GROWTH TO DATE CULTURE WILL BE HELD FOR 5 DAYS BEFORE ISSUING A FINAL NEGATIVE REPORT   Report Status PENDING   Incomplete   CULTURE, BLOOD (ROUTINE X 2)     Status: Normal (Preliminary result)   Collection Time   02/06/12  8:45 PM      Component Value Range Status Comment   Specimen Description BLOOD LEFT ARM   Final    Special Requests BOTTLES DRAWN AEROBIC AND ANAEROBIC 2CC   Final    Culture  Setup Time [id number]   Final    Culture     Final    Value:        BLOOD CULTURE  RECEIVED NO GROWTH TO DATE CULTURE WILL BE HELD FOR 5 DAYS BEFORE ISSUING A FINAL NEGATIVE REPORT   Report Status PENDING   Incomplete     Medical History: Past Medical History  Diagnosis Date  . Multiple sclerosis   . GERD (gastroesophageal reflux disease)   . Depression   . Recurrent UTI   . Recurrent pneumonia   . Chronic constipation   . Diverticulosis     Assessment: 70YOF to restart vancomycin and zosyn for suspected pneumonia.  Patient had received vanc/zosyn 2/14 - 2/17 for empiric treatment of aspiration pneumonia and treatment of known UTI (proteus mirabilis sensitive to zosyn) in setting of chronic indwelling catheter.  Goal of Therapy:  Vancomycin trough level 15-20 mcg/ml  Plan:  Vancomycin 1g IV q12h. Zosyn 3.375g IV q8h (4 hour infusion time).   Clance Boll 02/12/2012,8:43 PM

## 2012-02-12 NOTE — Progress Notes (Signed)
OT Note Order received, chart esides at snf and plan is to return at d/c. Requires +2 assist with mobilityreviewed. Pt currently r (pt only completing 15%). Pt presents with no OT needs in acute. Will sign off and defer OT eval back to SNF.  Garrel Ridgel, OTR/L  Pager [phone] 02/12/2012

## 2012-02-12 NOTE — Progress Notes (Signed)
Pt daughter called and informed of pt d/c to [facility].

## 2012-02-12 NOTE — ED Notes (Signed)
MD at bedside. Dr. Lenise Arena, hospitalist, at bedside speaking with family.

## 2012-02-12 NOTE — Progress Notes (Signed)
Patient cleared for discharge. Patient set up to return to [place]. Packet copied and placed in [place]. CSW called patients daughter, Lilyan Punt, left voicemail informing of d/c. ptar called for transportation. Jousha Schwandt C. Maziah Keeling MSW, LCSW [phone]

## 2012-02-12 NOTE — H&P (Signed)
PCP:  Thane Edu, MD, MD   DOA:  02/12/2012  5:40 PM  Chief Complaint:  Worsening confusion with generalized weakness  HPI: t is 71 yo female presents to [facility] ED after just being discharged today to SNF for worsening confusion and generalized weakness. Pt reports having fevers and chills, but denies abdominal or urinary concerns. She tells me she felt somewhat sick in the morning but her appetite has been poor for some time. She also tells me that she has no specific focal weakness but it is rather generalized with no headaches, no stiff neck, no visual changes. Pt's son and daughter are at bedside and report that mother was not feeling well at all today and when she came to SNF she was very hot and confused and Tmax was 101.5 F.   Allergies: No Known Allergies  Prior to Admission medications   Medication Sig Start Date End Date Taking? Authorizing Provider  acetaminophen (TYLENOL) 120 MG suppository Place 120 mg rectally once.   Yes Historical Provider, MD  amitriptyline (ELAVIL) 100 MG tablet Take 100 mg by mouth at bedtime.   Yes Historical Provider, MD  dalfampridine (AMPYRA) 10 MG TB12 Take 10 mg by mouth 2 (two) times daily.     Yes Historical Provider, MD  docusate sodium (COLACE) 100 MG capsule Take 200 mg by mouth 2 (two) times daily.     Yes Historical Provider, MD  feeding supplement (PRO-STAT SUGAR FREE 64) LIQD Take 30 mLs by mouth 2 (two) times daily.   Yes Historical Provider, MD  FLUoxetine (PROZAC) 40 MG capsule Take 40 mg by mouth daily.     Yes Historical Provider, MD  gabapentin (NEURONTIN) 300 MG capsule Take 300-600 mg by mouth See admin instructions. Take two capsules at lunch for 600mg  dosage and take one tablet at 5pm and 9pm for 300mg  dosage   Yes Historical Provider, MD  Multiple Vitamins-Minerals (MULTIVITAMINS THER. W/MINERALS) TABS Take 1 tablet by mouth daily.     Yes Historical Provider, MD  naproxen sodium (ANAPROX) 220 MG tablet Take 220 mg by  mouth 2 (two) times daily with a meal.   Yes Historical Provider, MD  omeprazole (PRILOSEC) 20 MG capsule Take 20 mg by mouth 2 (two) times daily.     Yes Historical Provider, MD  oxyCODONE (OXY IR/ROXICODONE) 5 MG immediate release tablet Take 1 tablet (5 mg total) by mouth every 4 (four) hours as needed for pain. pain 02/12/12  Yes Hillery Aldo, MD  vitamin C (ASCORBIC ACID) 500 MG tablet Take 500 mg by mouth 2 (two) times daily.     Yes Historical Provider, MD  cefUROXime (CEFTIN) 500 MG tablet Take 1 tablet (500 mg total) by mouth 2 (two) times daily. 02/12/12 02/22/12  Hillery Aldo, MD  ferrous sulfate 325 (65 FE) MG tablet Take 1 tablet (325 mg total) by mouth 2 (two) times daily with a meal. 02/12/12 02/11/13  Hillery Aldo, MD    Past Medical History  Diagnosis Date  . Multiple sclerosis   . GERD (gastroesophageal reflux disease)   . Depression   . Recurrent UTI   . Recurrent pneumonia   . Chronic constipation   . Diverticulosis     Past Surgical History  Procedure Date  . Cholecystectomy   . Orif humeral shaft fracture 01/2011  . Video assisted thoracoscopy 06/2010  . Closed reduction shoulder dislocation 04/2010    Fracture dislocation  . Femur im nail 01/13/2012    Procedure: INTRAMEDULLARY (IM) RETROGRADE  FEMORAL NAILING;  Surgeon: Thera Flake., MD;  Location: [facility] OR;  Service: Orthopedics;  Laterality: Left;  . Femoral artery exploration 01/13/2012    Procedure: FEMORAL ARTERY EXPLORATION;  Surgeon: Pryor Ochoa, MD;  Location: [facility] OR;  Service: Vascular;  Laterality: Left;    Social History:  reports that she has never smoked. She does not have any smokeless tobacco history on file. She reports that she does not drink alcohol or use illicit drugs.  No family history on file.  Review of Systems:  Constitutional: per HPI HEENT: Denies photophobia, eye pain, redness, hearing loss, ear pain, congestion, sore throat, rhinorrhea, sneezing, mouth sores, trouble swallowing,  neck pain, neck stiffness and tinnitus.   Respiratory: Denies SOB, DOE, cough, chest tightness,  and wheezing.   Cardiovascular: Denies chest pain, palpitations and leg swelling.  Gastrointestinal: Denies nausea, vomiting, abdominal pain, diarrhea, constipation, blood in stool and abdominal distention.  Genitourinary: Denies dysuria, urgency, frequency, hematuria, flank pain and difficulty urinating.  Musculoskeletal: Denies myalgias, back pain, joint swelling, arthralgias and gait problem.  Skin: Denies pallor, rash and wound.  Neurological: Denies dizziness, seizures, syncope, light-headedness, numbness and headaches.  Hematological: Denies adenopathy. Easy bruising, personal or family bleeding history  Psychiatric/Behavioral: Denies suicidal ideation, mood changes, nervousness, sleep disturbance and agitation   Physical Exam:  Filed Vitals:   02/12/12 1748 02/12/12 1918 02/12/12 1924 02/12/12 1929  BP: 106/43 122/60 110/59   Pulse: 104 101 100   Temp: 98.6 F (37 C) 99 F (37.2 C)  98.7 F (37.1 C)  TempSrc: Oral Oral  Oral  Resp: 18 19 24    SpO2: 97% 98% 98%     Constitutional: Vital signs reviewed.  Patient is a well-developed and well-nourished in no acute distress and cooperative with exam. Alert and oriented x3.  Head: Normocephalic and atraumatic Ear: TM normal bilaterally Mouth: no erythema or exudates, MMM Eyes: PERRL, EOMI, conjunctivae normal, No scleral icterus.  Neck: Supple, Trachea midline normal ROM, No JVD, mass, thyromegaly, or carotid bruit present.  Cardiovascular: RRR, S1 normal, S2 normal, no MRG, pulses symmetric and intact bilaterally Pulmonary/Chest: CTAB, no wheezes, rales, or rhonchi Abdominal: Soft. Non-tender, non-distended, bowel sounds are normal, no masses, organomegaly, or guarding present.  GU: no CVA tenderness, foley in place Musculoskeletal: No joint deformities, erythema, or stiffness, ROM full and no nontender Ext: +1 bilateral pitting  edema and no cyanosis, pulses palpable bilaterally (DP and PT) Hematology: no cervical, inginal, or axillary adenopathy.  Neurological: A&O x3, Strenght is normal and symmetric bilaterally, cranial nerve II-XII are grossly intact, no focal motor deficit, sensory intact to light touch bilaterally.  Skin: Warm, dry and intact. No rash, cyanosis, or clubbing.  Psychiatric: Normal mood and affect. speech and behavior is normal. Judgment and thought content normal. Cognition and memory are normal.   Labs on Admission:  Results for orders placed during the hospital encounter of 02/12/12 (from the past 48 hour(s))  URINALYSIS, ROUTINE W REFLEX MICROSCOPIC     Status: Abnormal   Collection Time   02/12/12  6:08 PM      Component Value Range Comment   Color, Urine YELLOW  YELLOW     APPearance TURBID (*) CLEAR     Specific Gravity, Urine 1.015  1.005 - 1.030     pH 6.5  5.0 - 8.0     Glucose, UA NEGATIVE  NEGATIVE (mg/dL)    Hgb urine dipstick LARGE (*) NEGATIVE     Bilirubin Urine NEGATIVE  NEGATIVE     Ketones, ur NEGATIVE  NEGATIVE (mg/dL)    Protein, ur 30 (*) NEGATIVE (mg/dL)    Urobilinogen, UA 1.0  0.0 - 1.0 (mg/dL)    Nitrite NEGATIVE  NEGATIVE     Leukocytes, UA LARGE (*) NEGATIVE    URINE MICROSCOPIC-ADD ON     Status: Abnormal   Collection Time   02/12/12  6:08 PM      Component Value Range Comment   Squamous Epithelial / LPF FEW (*) RARE     WBC, UA TOO NUMEROUS TO COUNT  <3 (WBC/hpf)    RBC / HPF TOO NUMEROUS TO COUNT  <3 (RBC/hpf)    Bacteria, UA FEW (*) RARE     Urine-Other MUCOUS PRESENT     CBC     Status: Abnormal   Collection Time   02/12/12  6:15 PM      Component Value Range Comment   WBC 14.4 (*) 4.0 - 10.5 (K/uL)    RBC 3.16 (*) 3.87 - 5.11 (MIL/uL)    Hemoglobin 7.8 (*) 12.0 - 15.0 (g/dL)    HCT 45.4 (*) 09.8 - 46.0 (%)    MCV 77.8 (*) 78.0 - 100.0 (fL)    MCH 24.7 (*) 26.0 - 34.0 (pg)    MCHC 31.7  30.0 - 36.0 (g/dL)    RDW 11.9 (*) 14.7 - 15.5 (%)     Platelets 320  150 - 400 (K/uL)   DIFFERENTIAL     Status: Abnormal   Collection Time   02/12/12  6:15 PM      Component Value Range Comment   Neutrophils Relative 80 (*) 43 - 77 (%)    Neutro Abs 11.5 (*) 1.7 - 7.7 (K/uL)    Lymphocytes Relative 12  12 - 46 (%)    Lymphs Abs 1.7  0.7 - 4.0 (K/uL)    Monocytes Relative 8  3 - 12 (%)    Monocytes Absolute 1.1 (*) 0.1 - 1.0 (K/uL)    Eosinophils Relative 0  0 - 5 (%)    Eosinophils Absolute 0.0  0.0 - 0.7 (K/uL)    Basophils Relative 0  0 - 1 (%)    Basophils Absolute 0.0  0.0 - 0.1 (K/uL)   COMPREHENSIVE METABOLIC PANEL     Status: Abnormal   Collection Time   02/12/12  6:15 PM      Component Value Range Comment   Sodium 135  135 - 145 (mEq/L)    Potassium 4.2  3.5 - 5.1 (mEq/L)    Chloride 106  96 - 112 (mEq/L)    CO2 22  19 - 32 (mEq/L)    Glucose, Bld 98  70 - 99 (mg/dL)    BUN 10  6 - 23 (mg/dL)    Creatinine, Ser 8.29  0.50 - 1.10 (mg/dL)    Calcium 9.0  8.4 - 10.5 (mg/dL)    Total Protein 5.7 (*) 6.0 - 8.3 (g/dL)    Albumin 1.9 (*) 3.5 - 5.2 (g/dL)    AST 16  0 - 37 (U/L)    ALT 18  0 - 35 (U/L)    Alkaline Phosphatase 152 (*) 39 - 117 (U/L)    Total Bilirubin 0.4  0.3 - 1.2 (mg/dL)    GFR calc non Af Amer >90  >90 (mL/min)    GFR calc Af Amer >90  >90 (mL/min)   LACTIC ACID, PLASMA     Status: Normal   Collection Time   02/12/12  6:15 PM      Component Value Range Comment   Lactic Acid, Venous 0.8  0.5 - 2.2 (mmol/L)     Radiological Exams on Admission: No results found.  Assessment/Plan Principal Problem:  *Weakness generalized with confusion - unclear etiology at this time and I am not able to appreciate significant degree of confusion given the fact that the patient is A&O x 3 and follows commands appropriately- per family members pt is not at her baseline but I am not sure what is the exact mental status baseline - will admit the pt to telemetry floor for further evaluation and management - pt has new findings  of elevated WBC compared to 02/17 lab data and with fevers and chills this is certainly worrisome for an acute infectious etiology, I am not clear where the site is - will obtain blood cultures, urine cultures, procalcitonin - will start empiric treatment with Vanc and Zosyn as this could be PNA as well UTI, I am not concerned for C. Diff given no diarrhea  Active Problems:  Leukocytosis - will proceed with work up as noted in the principal problem   Anemia due to chronic illness - appears to be stable and at pt's baseline   UTI (lower urinary tract infection) - will cover broadly for now but this is most likely chronic colonization and pt has no specific urinary concerns   PNEUMONIA - will cover broadly as noted above as this could be developing PNA - will follow up on blood cultures - will probably need to add levaquin to ensure coverage for HCAP but will defer that decision to primary team based on vitals overnight and CBC in AM nd clinical picture as well - please note that pt has recently completed course of antibiotics and was d/c to SNF today on PO abx  Time Spent on Admission: Over 30 minutes  MAGICK-Rashaun Curl 02/12/2012, 7:57 PM  [phone]

## 2012-02-12 NOTE — Discharge Summary (Signed)
Physician Discharge Summary  Patient ID: [name] [name] MRN: [id number] DOB/AGE: 71-Feb-1942 71 y.o.  Admit date: 02/02/2012 Discharge date: 02/12/2012  Primary Care Physician:  Thane Edu, MD, MD   Discharge Diagnoses:    Present on Admission:  .Septic shock .Hypokalemia .Elevated LFTs .Normocytic anemia .Hypoglycemia .ARF (acute renal failure) .Hyperkalemia .Multiple sclerosis .Infection due to urinary indwelling catheter .DEPRESSION .Hospital acquired PNA .Thrush .Left arm swelling .Superficial venous thrombosis of arm .Anorexia .Obesity (BMI 30-39.9)  Discharge Medications:  Medication List  As of 02/12/2012 10:50 AM   TAKE these medications         acetaminophen 120 MG suppository   Commonly known as: TYLENOL   Place 120 mg rectally once.      amitriptyline 100 MG tablet   Commonly known as: ELAVIL   Take 100 mg by mouth at bedtime.      AMPYRA 10 MG Tb12   Generic drug: dalfampridine   Take 10 mg by mouth 2 (two) times daily.      cefUROXime 500 MG tablet   Commonly known as: CEFTIN   Take 1 tablet (500 mg total) by mouth 2 (two) times daily x 3 more days      docusate sodium 100 MG capsule   Commonly known as: COLACE   Take 200 mg by mouth 2 (two) times daily.      feeding supplement Liqd   Take 30 mLs by mouth 2 (two) times daily.      ferrous sulfate 325 (65 FE) MG tablet   Take 1 tablet (325 mg total) by mouth 2 (two) times daily with a meal.      FLUoxetine 40 MG capsule   Commonly known as: PROZAC   Take 40 mg by mouth daily.      gabapentin 300 MG capsule   Commonly known as: NEURONTIN   Take 300-600 mg by mouth See admin instructions. Take two capsules at lunch for 600mg  dosage and take one tablet at 5pm and 9pm for 300mg  dosage      multivitamins ther. w/minerals Tabs   Take 1 tablet by mouth daily.      naproxen sodium 220 MG tablet   Commonly known as: ANAPROX   Take 220 mg by mouth 2 (two) times daily with a meal.      omeprazole 20 MG capsule   Commonly known as: PRILOSEC   Take 20 mg by mouth 2 (two) times daily.      oxyCODONE 5 MG immediate release tablet   Commonly known as: Oxy IR/ROXICODONE   Take 1 tablet (5 mg total) by mouth every 4 (four) hours as needed for pain. pain      vitamin C 500 MG tablet   Commonly known as: ASCORBIC ACID   Take 500 mg by mouth 2 (two) times daily.             Disposition and Follow-up: The patient is being discharged to a SNF.  She should follow up with her PCP in one week.   Medical Consults:    Other Consults:      Significant Diagnostic Studies:   Dg Chest 1 View 02/08/2012  IMPRESSION: Little interval change.  Original Report Authenticated By: Patterson Hammersmith, M.D.    Upper extremity venous duplex left: No obvious evidence of superficial vein thrombosis involving the left cephalic vein. All other veins patent.    Dg Femur Left 01/13/2012   IMPRESSION: Intraoperative fixation of left femoral fracture as above.  Original Report  Authenticated By: Harrel Lemon, M.D.    Dg Chest Port 1 View 02/07/2012  IMPRESSION: Shallow inspiration with stable appearance of cardiac enlargement, pulmonary vascular congestion, and perihilar infiltration.  Original Report Authenticated By: Marlon Pel, M.D.    Dg Chest Port 1 View 02/06/2012  IMPRESSION: Shallow inspiration with borderline heart size and pulmonary vascularity.  Perihilar air space interstitial disease could represent pneumonia or edema.  PICC line tip is over the low SVC region.  Original Report Authenticated By: Marlon Pel, M.D.    Dg Chest Port 1 View 02/06/2012  IMPRESSION: Diffuse bilateral air space disease, somewhat asymmetric on the right, possibly due to edema.  Pneumonia is not excluded.  Original Report Authenticated By: Reyes Ivan, M.D.    Dg Chest Port 1 View 02/05/2012 IMPRESSION: No interval change.  Original Report Authenticated By: Andreas Newport, M.D.     Dg Chest Port 1 View 02/04/2012  *RADIOLOGY REPORT*  Clinical Data: Follow-up effusions.  PORTABLE CHEST - 1 VIEW  Comparison: 02/03/2012  Findings: Cardiac size is within normal limits.  The patient is rotated towards the left.  Shallow inflation.  Patchy bilateral infiltrates appears stable.  IMPRESSION: Persistent bilateral infiltrates.  Original Report Authenticated By: Patterson Hammersmith, M.D.    Portable Chest Xray In Am 02/03/2012  *RADIOLOGY REPORT*  Clinical Data: Pneumonia.  Edema.  PORTABLE CHEST - 1 VIEW  Comparison: 02/02/2012  Findings: Shallow inspiration.  Mild cardiac enlargement with some pulmonary vascular congestion.  Perihilar infiltrates suggesting edema or possibly pneumonia.  Blunting of the left costophrenic angle likely represents a small effusion.  No pneumothorax.  No significant change since previous study.  IMPRESSION: Shallow inspiration.  Cardiac enlargement with pulmonary vascular congestion and probable perihilar edema.  Small left pleural effusion.  Original Report Authenticated By: Marlon Pel, M.D.    Dg Chest Portable 1 View 02/02/2012  *RADIOLOGY REPORT*  Clinical Data: Sepsis and unresponsive.  PORTABLE CHEST - 1 VIEW  Comparison: 01/12/2012  Findings: Lung volumes are very low with significant atelectasis present.  It would be difficult to exclude subtle infiltrate given the compressed lungs.  There is no overt pulmonary edema.  There may be a tiny amount of left pleural fluid.  The heart size is within normal limits.  IMPRESSION: Extremely low lung volumes with bilateral atelectasis.  Potential tiny left pleural effusion.  Original Report Authenticated By: Reola Calkins, M.D.      Discharge Laboratory Values: Basic Metabolic Panel:  Lab 02/10/12 4098 02/08/12 0505 02/07/12 0458 02/06/12 0510  NA 139 141 144 144  K 3.9 3.3* -- --  CL 110 112 115* 116*  CO2 23 23 22 21   GLUCOSE 92 107* 110* 91  BUN 11 20 22 22   CREATININE 0.48* 0.60 0.55 0.54    CALCIUM 8.9 8.8 9.1 9.4  MG -- -- -- 1.6  PHOS -- -- -- 2.4   GFR Estimated Creatinine Clearance: 70.4 ml/min (by C-G formula based on Cr of 0.48). Liver Function Tests:  Lab 02/08/12 0505 02/07/12 0458  AST 24 32  ALT 35 45*  ALKPHOS 137* 151*  BILITOT 0.4 0.5  PROT 4.5* 4.9*  ALBUMIN 1.6* 1.8*    CBC:  Lab 02/10/12 0411 02/08/12 0505 02/07/12 0458 02/06/12 0510  WBC 7.2 7.3 8.1 6.0  NEUTROABS -- -- -- --  HGB 7.5* 7.1* 7.8* 7.5*  HCT 23.9* 22.5* 24.3* 23.7*  MCV 78.4 79.2 78.9 78.5  PLT 330 232 235 276  Microbiology Recent Results (from the past 240 hour(s))  CULTURE, BLOOD (ROUTINE X 2)     Status: Normal   Collection Time   02/02/12 11:55 AM      Component Value Range Status Comment   Specimen Description BLOOD LEFT WRIST  5 ML IN [facility] BOTTLE   Final    Special Requests NONE   Final    Culture  Setup Time [id number]   Final    Culture     Final    Value: STAPHYLOCOCCUS SPECIES (COAGULASE NEGATIVE)     Note: SUSCEPTIBILITIES PERFORMED ON PREVIOUS CULTURE WITHIN THE LAST 5 DAYS.     Note: Gram Stain Report Called to,Read Back By and Verified With: RN A. ARNOLD ON 02/03/12 AT 1528 BY TEDAR   Report Status 02/05/2012 FINAL   Final   CULTURE, BLOOD (ROUTINE X 2)     Status: Normal   Collection Time   02/02/12 12:40 PM      Component Value Range Status Comment   Specimen Description BLOOD LEFT FEMORAL  5 ML IN [facility] BOTTLE   Final    Special Requests NONE   Final    Culture  Setup Time [id number]   Final    Culture     Final    Value: STAPHYLOCOCCUS SPECIES (COAGULASE NEGATIVE)     Note: RIFAMPIN AND GENTAMICIN SHOULD NOT BE USED AS SINGLE DRUGS FOR TREATMENT OF STAPH INFECTIONS. This organism DOES NOT demonstrate inducible Clindamycin resistance in vitro.     Note: Gram Stain Report Called to,Read Back By and Verified With: RN A. ARNOLD ON 02/03/12 AT 1528 BY TEDAR   Report Status 02/05/2012 FINAL   Final    Organism ID, Bacteria STAPHYLOCOCCUS SPECIES (COAGULASE  NEGATIVE)   Final   URINE CULTURE     Status: Normal   Collection Time   02/02/12  1:06 PM      Component Value Range Status Comment   Specimen Description URINE, CLEAN CATCH   Final    Special Requests NONE   Final    Culture  Setup Time [id number]   Final    Colony Count >=100,000 COLONIES/ML   Final    Culture     Final    Value: PROVIDENCIA STUARTII     PROTEUS MIRABILIS   Report Status 02/05/2012 FINAL   Final    Organism ID, Bacteria PROVIDENCIA STUARTII   Final    Organism ID, Bacteria PROTEUS MIRABILIS   Final   MRSA PCR SCREENING     Status: Normal   Collection Time   02/02/12  4:00 PM      Component Value Range Status Comment   MRSA by PCR NEGATIVE  NEGATIVE  Final   CULTURE, BLOOD (ROUTINE X 2)     Status: Normal (Preliminary result)   Collection Time   02/06/12  8:35 PM      Component Value Range Status Comment   Specimen Description BLOOD LEFT ARM   Final    Special Requests BOTTLES DRAWN AEROBIC AND ANAEROBIC 4CC   Final    Culture  Setup Time [id number]   Final    Culture     Final    Value:        BLOOD CULTURE RECEIVED NO GROWTH TO DATE CULTURE WILL BE HELD FOR 5 DAYS BEFORE ISSUING A FINAL NEGATIVE REPORT   Report Status PENDING   Incomplete   CULTURE, BLOOD (ROUTINE X 2)     Status: Normal (Preliminary result)  Collection Time   02/06/12  8:45 PM      Component Value Range Status Comment   Specimen Description BLOOD LEFT ARM   Final    Special Requests BOTTLES DRAWN AEROBIC AND ANAEROBIC 2CC   Final    Culture  Setup Time [id number]   Final    Culture     Final    Value:        BLOOD CULTURE RECEIVED NO GROWTH TO DATE CULTURE WILL BE HELD FOR 5 DAYS BEFORE ISSUING A FINAL NEGATIVE REPORT   Report Status PENDING   Incomplete      Brief H and P: For complete details please refer to admission H and P, but in brief, Mrs. Gent is a 71 year old female with a PMHx of multiple sclerosis, chronic indwelling foley catheter for approximately the last 6 months,  presented 2/9 with a sepsis syndrome - fever 102.7, high lactate, low BP who did not respond to a 3 L fluid challenge in ER. Her foley was changed - UA pos, given zosyn, vanc, BP improved to 90's , femoral CVL placed by EDP. PCCM asked to assume care. DNR noted & discussed (although pressors and anti-arrhythmics OK). She was under the care of PCCM until 02/06/12, when [facility] hospitalists asked to assume care.  Physical Exam at Discharge: BP 119/71  Pulse 95  Temp(Src) 98.3 F (36.8 C) (Oral)  Resp 17  Ht 5\' 5"  (1.651 m)  Wt 85.1 kg (187 lb 9.8 oz)  BMI 31.22 kg/m2  SpO2 98% Gen:  NAD Cardiovascular:  RRR, No M/R/G Respiratory: Lungs CTAB Gastrointestinal: Abdomen soft, NT/ND with normal active bowel sounds. Extremities: No C/E/C    Hospital Course:  Principal Problem:  *Septic shock / Infection due to urinary indwelling catheter  The patient was admitted and cultures of her blood and urine were obtained. She was given fluids for volume resuscitation. She was initially treated with broad spectrum antibiotics including Imipenem and Vancomycin. Blood cultures were positive for coagulase negative staph and urine cultures grew Providentia and Proteus, both sensitive to Rocephin, so her antibiotics were narrowed on 02/05/12. She did require pressor support with Levophed, which has now been weaned off. On the night of 2/13-2/14, the patient decompensated with recurrent hypotension, fever, and tachycardia. Blood cultures were obtained. Antibiotics were broadened to cover aspiration PNA/HAP (Rocephin d/c'd, Vanc/Zosyn restarted on 02/07/12). Continues to improve. Narrowed antibiotics to PO Ceftin on 02/11/12. She will be d/c'd on 3 more days of therapy with Ceftin.  Active Problems:  Hospital acquired PNA  F/U blood cultures. Antibiotics broadened from Rocephin to Vancomycin and Zosyn on 02/06/12. Narrowed to PO Ceftin 02/10/12. Respiratory status stable at discharge. Multiple sclerosis  All sedating  medications were discontinued. The patient is wheelchair bound at baseline, but transfers. PT/OT evaluations ordered, but was NWB after surgery. Spoke with Dr. Madelon Lips who states she can bear weight on right, and is OK to transfer and sit in chair. Resume Ampyra.  Hypokalemia / Hyperkalemia  Potassium normalized with increased routine supplementation.  Elevated LFTs  Unclear etiology, but likely from sepsis. She is s/p cholecystectomy. LFTs trending down over time.  Normocytic anemia  Anemia panel shows low iron and low TIBC, which is consistent with anemia of chronic disease. D/C on iron.  Hypoglycemia  Resolved with dextrose in IVF.  ARF (acute renal failure)  Resolved with IVF, restoration of BP.  Depression  On Prozac, which we we have resumed.  Thrush  Started Nystatin on 02/08/12.  OK to discontinue.   LUE swelling secondary to superficial thrombosis in the left cephalic vein  Preliminary report showed a superficial clot, but the final results did not.  No indication for therapeutic dose anti-coagulation. On SQ heparin for DVT prophylaxis.  Anorexia / Obesity BMI 32.7  Status post dietician evaluation.    Diet:  Heart healthy  Activity:  Increase activity slowly.  Transfer to chair. NWB on left.  Condition at Discharge:   Improved  Time spent on Discharge: 40 minutes  Signed: Dr. Trula Ore Pa[place] Pager [phone] 02/12/2012, 11:02 AM

## 2012-02-12 NOTE — ED Notes (Signed)
[id number]<AV> Expected date:<BR> Expected time:<BR> Means of arrival:<BR> Comments:<BR> Hold for rm 13

## 2012-02-12 NOTE — ED Notes (Signed)
MD Tucich at bedside.

## 2012-02-12 NOTE — ED Provider Notes (Signed)
History     CSN: [id number]  Arrival date & time 02/12/12  1738   First MD Initiated Contact with Patient 02/12/12 1741      Chief Complaint  Patient presents with  . Urinary Tract Infection   patient with a known history of chronic MS, recently admitted for septic shock and multiple other medical problem. Patient was discharged from the hospital today. Early this afternoon and went to the [facility] facility. She was scheduled to continue her outpatient antibiotics and did have a Foley catheter in place. The staff sent her back for a reported fever and he reported decrease in consciousness. The patient states, "I feel like crap." She describes diffuse malaise, and myalgias. However, she is awake, alert, and oriented to person, place, and time. She denies any chest pain or abdominal pain, denies any headache or neck pain. Initial temperature in the ED, was normal, and we'll check a rectal temp  (Consider location/radiation/quality/duration/timing/severity/associated sxs/prior treatment) HPI  Past Medical History  Diagnosis Date  . Multiple sclerosis   . GERD (gastroesophageal reflux disease)   . Depression   . Recurrent UTI   . Recurrent pneumonia   . Chronic constipation   . Diverticulosis     Past Surgical History  Procedure Date  . Cholecystectomy   . Orif humeral shaft fracture 01/2011  . Video assisted thoracoscopy 06/2010  . Closed reduction shoulder dislocation 04/2010    Fracture dislocation  . Femur im nail 01/13/2012    Procedure: INTRAMEDULLARY (IM) RETROGRADE FEMORAL NAILING;  Surgeon: Thera Flake., MD;  Location: [facility] OR;  Service: Orthopedics;  Laterality: Left;  . Femoral artery exploration 01/13/2012    Procedure: FEMORAL ARTERY EXPLORATION;  Surgeon: Pryor Ochoa, MD;  Location: [facility] OR;  Service: Vascular;  Laterality: Left;    No family history on file.  History  Substance Use Topics  . Smoking status: Never Smoker   . Smokeless tobacco: Not on file  .  Alcohol Use: No    OB History    Grav Para Term Preterm Abortions TAB SAB Ect Mult Living                  Review of Systems  All other systems reviewed and are negative.    Allergies  Review of patient's allergies indicates no known allergies.  Home Medications   Current Outpatient Rx  Name Route Sig Dispense Refill  . ACETAMINOPHEN 120 MG RE SUPP Rectal Place 120 mg rectally once.    . AMITRIPTYLINE HCL 100 MG PO TABS Oral Take 100 mg by mouth at bedtime.    Marland Kitchen DALFAMPRIDINE ER 10 MG PO TB12 Oral Take 10 mg by mouth 2 (two) times daily.      Marland Kitchen DOCUSATE SODIUM 100 MG PO CAPS Oral Take 200 mg by mouth 2 (two) times daily.      Marland Kitchen PRO-STAT 64 PO LIQD Oral Take 30 mLs by mouth 2 (two) times daily.    Marland Kitchen FLUOXETINE HCL 40 MG PO CAPS Oral Take 40 mg by mouth daily.      Marland Kitchen GABAPENTIN 300 MG PO CAPS Oral Take 300-600 mg by mouth See admin instructions. Take two capsules at lunch for 600mg  dosage and take one tablet at 5pm and 9pm for 300mg  dosage    . THERA M PLUS PO TABS Oral Take 1 tablet by mouth daily.      Marland Kitchen NAPROXEN SODIUM 220 MG PO TABS Oral Take 220 mg by mouth 2 (two) times  daily with a meal.    . OMEPRAZOLE 20 MG PO CPDR Oral Take 20 mg by mouth 2 (two) times daily.      . OXYCODONE HCL 5 MG PO TABS Oral Take 1 tablet (5 mg total) by mouth every 4 (four) hours as needed for pain. pain 12 tablet 0  . VITAMIN C 500 MG PO TABS Oral Take 500 mg by mouth 2 (two) times daily.      Marland Kitchen CEFUROXIME AXETIL 500 MG PO TABS Oral Take 1 tablet (500 mg total) by mouth 2 (two) times daily.      For 3 more days  . FERROUS SULFATE 325 (65 FE) MG PO TABS Oral Take 1 tablet (325 mg total) by mouth 2 (two) times daily with a meal.      BP 106/43  Pulse 104  Temp(Src) 98.6 F (37 C) (Oral)  Resp 18  SpO2 97%  Physical Exam  Nursing note and vitals reviewed. Constitutional: She appears well-developed and well-nourished. No distress.  HENT:  Head: Normocephalic.  Eyes: Pupils are equal,  round, and reactive to light.  Cardiovascular: Normal heart sounds.   Pulmonary/Chest: Breath sounds normal.  Abdominal: Soft.  Musculoskeletal: Normal range of motion.       Chronic appearing edema. No redness  Neurological: She is alert.       Listless with no frank lethargy, chronic weakness of multiple sclerosis. Awake and alert to person, place, time. Patient knows president  Skin: Skin is warm and dry.    ED Course  Procedures (including critical care time)   Labs Reviewed  CBC  DIFFERENTIAL  COMPREHENSIVE METABOLIC PANEL  URINALYSIS, ROUTINE W REFLEX MICROSCOPIC  URINE CULTURE  CULTURE, BLOOD (ROUTINE X 2)  CULTURE, BLOOD (ROUTINE X 2)   No results found.   No diagnosis found.    MDM  Pt is seen and examined;  Initial history and physical completed.  Will follow.   6:08 PM  Discussed with [facility];  They are requesting Labs, UA, CXR and call back.      Date: 02/12/2012  Rate: 103  Rhythm: sinus tachycardia  QRS Axis: normal  Intervals: normal  ST/T Wave abnormalities: normal  Conduction Disutrbances:none  Narrative Interpretation:   Old EKG Reviewed: changes noted      Tauri Ethington A. Patrica Duel, MD 02/16/12 [phone]

## 2012-02-12 NOTE — Progress Notes (Signed)
Pt d./c no changes noted since am assessment.

## 2012-02-13 ENCOUNTER — Inpatient Hospital Stay (HOSPITAL_COMMUNITY): Payer: Medicare (Managed Care)

## 2012-02-13 ENCOUNTER — Other Ambulatory Visit: Payer: Self-pay

## 2012-02-13 ENCOUNTER — Encounter (HOSPITAL_COMMUNITY): Payer: Self-pay

## 2012-02-13 LAB — BASIC METABOLIC PANEL
BUN: 9 mg/dL (ref 6–23)
CO2: 23 mEq/L (ref 19–32)
Chloride: 105 mEq/L (ref 96–112)
GFR calc non Af Amer: 89 mL/min — ABNORMAL LOW (ref >90)
Glucose, Bld: 92 mg/dL (ref 70–99)
Potassium: 3.7 mEq/L (ref 3.5–5.1)
Sodium: 135 mEq/L (ref 135–145)

## 2012-02-13 LAB — CULTURE, BLOOD (ROUTINE X 2): Culture  Setup Time: 201302140134

## 2012-02-13 LAB — URINE CULTURE: Culture  Setup Time: 201302200139

## 2012-02-13 LAB — CBC
HCT: 27 % — ABNORMAL LOW (ref 36.0–46.0)
Hemoglobin: 8.5 g/dL — ABNORMAL LOW (ref 12.0–15.0)
MCH: 24.4 pg — ABNORMAL LOW (ref 26.0–34.0)
MCHC: 31.5 g/dL (ref 30.0–36.0)
MCV: 77.6 fL — ABNORMAL LOW (ref 78.0–100.0)
RBC: 3.48 MIL/uL — ABNORMAL LOW (ref 3.87–5.11)

## 2012-02-13 LAB — CARDIAC PANEL(CRET KIN+CKTOT+MB+TROPI)
Relative Index: INVALID (ref 0.0–2.5)
Total CK: 15 U/L (ref 7–177)

## 2012-02-13 LAB — TSH: TSH: 1.6 u[IU]/mL (ref 0.350–4.500)

## 2012-02-13 MED ORDER — ENSURE PUDDING PO PUDG
1.0000 | Freq: Two times a day (BID) | ORAL | Status: DC
  Administered 2012-02-14 – 2012-02-19 (×10): 1 via ORAL
  Filled 2012-02-13 (×12): qty 1

## 2012-02-13 MED ORDER — IOHEXOL 300 MG/ML  SOLN
100.0000 mL | Freq: Once | INTRAMUSCULAR | Status: AC | PRN
  Administered 2012-02-13: 80 mL via INTRAVENOUS

## 2012-02-13 MED ORDER — LEVOFLOXACIN IN D5W 750 MG/150ML IV SOLN
750.0000 mg | INTRAVENOUS | Status: DC
Start: 2012-02-13 — End: 2012-02-18
  Administered 2012-02-13 – 2012-02-17 (×5): 750 mg via INTRAVENOUS
  Filled 2012-02-13 (×6): qty 150

## 2012-02-13 NOTE — ED Notes (Signed)
02/13/2012... 6:45 AM... Report given to [place], [place] RN... Marvia Pickles, RN

## 2012-02-13 NOTE — Progress Notes (Signed)
Pt admitted from [facility] and per Duwayne Heck is able to return when stable. CSW will follow for SNF dispo.  Dellie Burns, MSW, [place] [phone]

## 2012-02-13 NOTE — Progress Notes (Signed)
Subjective: Patient feels a little better since admission.  She complains of some tenderness in her abdomen and some pain in her chest when she takes a deep breath.  Objective: Weight change:   Intake/Output Summary (Last 24 hours) at 02/13/12 1457 Last data filed at 02/13/12 1409  Gross per 24 hour  Intake    120 ml  Output   1300 ml  Net  -1180 ml    Filed Vitals:   02/13/12 1404  BP: 117/75  Pulse: 93  Temp: 97.5 F (36.4 C)  Resp: 19   GEN:  Patient appears her stated age, She is in no acute distress. CV: RRR Lungs: decrease breath sounds at the bases ABD: soft diffusely tender positive bowel sounds EXT: no edema  Lab Results: reviewed  Micro Results: Recent Results (from the past 240 hour(s))  CULTURE, BLOOD (ROUTINE X 2)     Status: Normal   Collection Time   02/06/12  8:35 PM      Component Value Range Status Comment   Specimen Description BLOOD LEFT ARM   Final    Special Requests BOTTLES DRAWN AEROBIC AND ANAEROBIC 4CC   Final    Culture  Setup Time [id number]   Final    Culture     Final    Value:        BLOOD CULTURE RECEIVED NO GROWTH TO DATE CULTURE WILL BE HELD FOR 5 DAYS BEFORE ISSUING A FINAL NEGATIVE REPORT   Report Status 02/13/2012 FINAL   Final   CULTURE, BLOOD (ROUTINE X 2)     Status: Normal   Collection Time   02/06/12  8:45 PM      Component Value Range Status Comment   Specimen Description BLOOD LEFT ARM   Final    Special Requests BOTTLES DRAWN AEROBIC AND ANAEROBIC [facility]   Final    Culture  Setup Time [id number]   Final    Culture     Final    Value:        BLOOD CULTURE RECEIVED NO GROWTH TO DATE CULTURE WILL BE HELD FOR 5 DAYS BEFORE ISSUING A FINAL NEGATIVE REPORT   Report Status 02/13/2012 FINAL   Final     Studies/Results: Dg Chest 1 View  02/08/2012  *RADIOLOGY REPORT*  Clinical Data: Recheck for pneumonia.  No chest pain or cough.  CHEST - 1 VIEW  Comparison: 02/07/2012  Findings: The patient has right-sided PICC line, tip to  the level of the superior vena cava.  There continues to be patchy density within the right lung and medial left lung base with little change from prior study.  Prior left humerus ORIF.  IMPRESSION: Little interval change.  Original Report Authenticated By: Patterson Hammersmith, M.D.   Dg Chest Port 1 View  02/12/2012  *RADIOLOGY REPORT*  Clinical Data: Fever  PORTABLE CHEST - 1 VIEW  Comparison: 02/08/2012  Findings: Decreased lung volumes with patchy right hilar and bibasilar airspace disease.  This could represent atelectasis versus pneumonia.  No effusion or pneumothorax.  No definite edema. Stable heart size.  Right PICC line remains.  IMPRESSION: Right perihilar and basilar streaky atelectasis/airspace process. Decreased lung volumes.  Original Report Authenticated By: Judie Petit. Ruel Favors, M.D.   Dg Chest Port 1 View  02/07/2012  *RADIOLOGY REPORT*  Clinical Data: Shortness of breath  PORTABLE CHEST - 1 VIEW  Comparison: 02/06/2012  Findings: Shallow inspiration.  Cardiac enlargement with pulmonary vascular congestion and perihilar infiltrates.  Changes are similar to previous  study.  No pneumothorax.  Right PICC catheter is unchanged in position.  IMPRESSION: Shallow inspiration with stable appearance of cardiac enlargement, pulmonary vascular congestion, and perihilar infiltration.  Original Report Authenticated By: Marlon Pel, M.D.   Dg Chest Port 1 View  02/06/2012  *RADIOLOGY REPORT*  Clinical Data: Increasing shortness of breath.  Decreasing oxygen saturation.  PORTABLE CHEST - 1 VIEW  Comparison: 02/06/2012 at 0416 hours  Findings: Shallow inspiration.  Borderline heart size and pulmonary vascularity.  Bilateral perihilar air space interstitial disease, greater on the right.  Changes could represent pneumonia or edema. Suggestion of blunting of the left costophrenic angle.  No pneumothorax.  Findings appear stable since the previous study. Interval placement of a right PICC catheter with tip  over the low SVC region.  IMPRESSION: Shallow inspiration with borderline heart size and pulmonary vascularity.  Perihilar air space interstitial disease could represent pneumonia or edema.  PICC line tip is over the low SVC region.  Original Report Authenticated By: Marlon Pel, M.D.   Dg Chest Port 1 View  02/06/2012  *RADIOLOGY REPORT*  Clinical Data: Atelectasis.  PORTABLE CHEST - 1 VIEW  Comparison: 02/05/2012.  Findings: Trachea is midline, given patient rotation.  Heart size is grossly stable.  There is diffuse bilateral air space disease, somewhat asymmetric on the right.  Left hemidiaphragm is elevated.  IMPRESSION: Diffuse bilateral air space disease, somewhat asymmetric on the right, possibly due to edema.  Pneumonia is not excluded.  Original Report Authenticated By: Reyes Ivan, M.D.   Dg Chest Port 1 View  02/05/2012  *RADIOLOGY REPORT*  Clinical Data: Atelectasis.  PORTABLE CHEST - 1 VIEW  Comparison: 02/04/2012.  Findings: Low volume chest.  Basilar atelectasis.  Perihilar and basilar predominant airspace disease is present.  Cardiopericardial silhouette and mediastinal contours are unchanged.  Patient is rotated to the right. Monitoring leads are projected over the chest.  IMPRESSION: No interval change.  Original Report Authenticated By: Andreas Newport, M.D.   Dg Chest Port 1 View  02/04/2012  *RADIOLOGY REPORT*  Clinical Data: Follow-up effusions.  PORTABLE CHEST - 1 VIEW  Comparison: 02/03/2012  Findings: Cardiac size is within normal limits.  The patient is rotated towards the left.  Shallow inflation.  Patchy bilateral infiltrates appears stable.  IMPRESSION: Persistent bilateral infiltrates.  Original Report Authenticated By: Patterson Hammersmith, M.D.   Portable Chest Xray In Am  02/03/2012  *RADIOLOGY REPORT*  Clinical Data: Pneumonia.  Edema.  PORTABLE CHEST - 1 VIEW  Comparison: 02/02/2012  Findings: Shallow inspiration.  Mild cardiac enlargement with some pulmonary  vascular congestion.  Perihilar infiltrates suggesting edema or possibly pneumonia.  Blunting of the left costophrenic angle likely represents a small effusion.  No pneumothorax.  No significant change since previous study.  IMPRESSION: Shallow inspiration.  Cardiac enlargement with pulmonary vascular congestion and probable perihilar edema.  Small left pleural effusion.  Original Report Authenticated By: Marlon Pel, M.D.   Dg Chest Portable 1 View  02/02/2012  *RADIOLOGY REPORT*  Clinical Data: Sepsis and unresponsive.  PORTABLE CHEST - 1 VIEW  Comparison: 01/12/2012  Findings: Lung volumes are very low with significant atelectasis present.  It would be difficult to exclude subtle infiltrate given the compressed lungs.  There is no overt pulmonary edema.  There may be a tiny amount of left pleural fluid.  The heart size is within normal limits.  IMPRESSION: Extremely low lung volumes with bilateral atelectasis.  Potential tiny left pleural effusion.  Original Report  Authenticated By: Reola Calkins, M.D.   Medications: Scheduled Meds:   . acetaminophen  120 mg Rectal Once  . amitriptyline  100 mg Oral QHS  . dalfampridine  10 mg Oral BID  . docusate sodium  200 mg Oral BID  . enoxaparin  40 mg Subcutaneous Q24H  . feeding supplement  30 mL Oral BID  . ferrous sulfate  325 mg Oral BID PC  . FLUoxetine  40 mg Oral Daily  . gabapentin  300 mg Oral Custom  . gabapentin  600 mg Oral Q1200  . pantoprazole  40 mg Oral Q1200  . piperacillin-tazobactam (ZOSYN)  IV  3.375 g Intravenous Once  . piperacillin-tazobactam (ZOSYN)  IV  3.375 g Intravenous Q8H  . sodium chloride  3 mL Intravenous Q12H  . vancomycin  1,000 mg Intravenous Q12H  . DISCONTD: gabapentin  300-600 mg Oral See admin instructions  . DISCONTD: sodium chloride  500 mL Intravenous STAT  . DISCONTD: vancomycin  750 mg Intravenous Q12H  . DISCONTD: vancomycin  1,000 mg Intravenous Once  . DISCONTD: vancomycin  1,000 mg  Intravenous Q12H   Continuous Infusions:   . sodium chloride 50 mL/hr at 02/13/12 0307   PRN Meds:.HYDROcodone-acetaminophen, ondansetron (ZOFRAN) IV, ondansetron  Assessment/Plan: Weakness generalized (02/12/2012)  Multifactorial, PT consult  PNEUMONIA(HCAP) (07/12/2010)   Continue antibiotics, Zosyn and VANC add Levaquin, check CT of the chest with patient chest pain and continued fever  Leukocytosis (02/12/2012)  Secondary to infection, monitor  Anemia due to chronic illness (02/12/2012)   Monitor  UTI (lower urinary tract infection) (02/12/2012)  Probable colonizaton, Continue  Antibiotic, culture pending.  Abdominal Tenderness Check LFT's and KUB,  Per patient no constipation.   LOS: 1 day   Bronda Alfred JARRETT 02/13/2012, 2:57 PM

## 2012-02-13 NOTE — Progress Notes (Addendum)
INITIAL ADULT NUTRITION ASSESSMENT Date: 02/13/2012   Time: 4:11 PM Reason for Assessment: health hx  ASSESSMENT: Female 71 y.o.  Dx: Weakness generalized  Hx:  Past Medical History  Diagnosis Date  . Multiple sclerosis   . GERD (gastroesophageal reflux disease)   . Depression   . Recurrent UTI   . Recurrent pneumonia   . Chronic constipation   . Diverticulosis    Past Surgical History  Procedure Date  . Cholecystectomy   . Orif humeral shaft fracture 01/2011  . Video assisted thoracoscopy 06/2010  . Closed reduction shoulder dislocation 04/2010    Fracture dislocation  . Femur im nail 01/13/2012    Procedure: INTRAMEDULLARY (IM) RETROGRADE FEMORAL NAILING;  Surgeon: Thera Flake., MD;  Location: [facility] OR;  Service: Orthopedics;  Laterality: Left;  . Femoral artery exploration 01/13/2012    Procedure: FEMORAL ARTERY EXPLORATION;  Surgeon: Pryor Ochoa, MD;  Location: [facility] OR;  Service: Vascular;  Laterality: Left;    Related Meds:  Scheduled Meds:   . acetaminophen  120 mg Rectal Once  . amitriptyline  100 mg Oral QHS  . dalfampridine  10 mg Oral BID  . docusate sodium  200 mg Oral BID  . enoxaparin  40 mg Subcutaneous Q24H  . feeding supplement  30 mL Oral BID  . ferrous sulfate  325 mg Oral BID PC  . FLUoxetine  40 mg Oral Daily  . gabapentin  300 mg Oral Custom  . gabapentin  600 mg Oral Q1200  . pantoprazole  40 mg Oral Q1200  . piperacillin-tazobactam (ZOSYN)  IV  3.375 g Intravenous Once  . piperacillin-tazobactam (ZOSYN)  IV  3.375 g Intravenous Q8H  . sodium chloride  3 mL Intravenous Q12H  . vancomycin  1,000 mg Intravenous Q12H  . DISCONTD: gabapentin  300-600 mg Oral See admin instructions  . DISCONTD: sodium chloride  500 mL Intravenous STAT  . DISCONTD: vancomycin  750 mg Intravenous Q12H  . DISCONTD: vancomycin  1,000 mg Intravenous Once  . DISCONTD: vancomycin  1,000 mg Intravenous Q12H   Continuous Infusions:   . sodium chloride 50 mL/hr at  02/13/12 0307   PRN Meds:.HYDROcodone-acetaminophen, ondansetron (ZOFRAN) IV, ondansetron   Ht: 5' 4.96" (165 cm)  Wt: 187 lb 9.8 oz (85.1 kg)  Ideal Wt: 55kg % Ideal Wt: 155%  Usual Wt: 86 kg % Usual Wt: 101%  Body mass index is 31.26 kg/(m^2).  Food/Nutrition Related Hx:  Pt readmitted for fever and decreased LOC. Pt has spent several days in the past few weeks as inpatient and has been seen and assessed by RD.  She has moments of disorientation and is unable to give accurate history.  Daughter is aware pt with variable PO intake.  Pt has somewhat limited food acceptance.  At first assessment, pt liked Ensure and requested it.  At second assessment, pt did not like Ensure and only wanted jello for meals. Pt continues a Regular diet despite difficulty swallowing r/t MS.  Labs:  CMP     Component Value Date/Time   NA 135 02/13/2012 0443   K 3.7 02/13/2012 0443   CL 105 02/13/2012 0443   CO2 23 02/13/2012 0443   GLUCOSE 92 02/13/2012 0443   BUN 9 02/13/2012 0443   CREATININE 0.62 02/13/2012 0443   CALCIUM 8.8 02/13/2012 0443   PROT 5.7* 02/12/2012 1815   ALBUMIN 1.9* 02/12/2012 1815   AST 16 02/12/2012 1815   ALT 18 02/12/2012 1815   ALKPHOS 152*  02/12/2012 1815   BILITOT 0.4 02/12/2012 1815   GFRNONAA 89* 02/13/2012 0443   GFRAA >90 02/13/2012 0443    Intake: PO intake 10-25% of meals Output:   Intake/Output Summary (Last 24 hours) at 02/13/12 1617 Last data filed at 02/13/12 1409  Gross per 24 hour  Intake    120 ml  Output   1300 ml  Net  -1180 ml   No BM since admission.  Diet Order: General  Supplements/Tube Feeding: Prostat BID  IVF:    sodium chloride Last Rate: 50 mL/hr at 02/13/12 0307    Estimated Nutritional Needs:   Kcal: 1240-1350 kcal Protein: 54-64g Fluid: >1.7 L/day  NUTRITION DIAGNOSIS: -Inadequate oral intake (NI-2.1).  Status: Ongoing  RELATED TO: AMS, MS, weakness  AS EVIDENCE BY: PO 10-25% of meals  MONITORING/EVALUATION(Goals): 1.   Food/Beverage; pt intake per her usual.  Despite admission for several days and PO suboptimal.  Wt has remained same.  Needs estimated for acute condition, chronic disease, and decreased mobility, however may not accurately despite pt's metabolic demand based on wt stability. Will monitor.  EDUCATION NEEDS: -No education needs identified at this time  INTERVENTION: 1.  Meals/snacks;  Pt has reported she like Ensure in the past as well as jello.  Will order snack for pt to be provided from nourishment room by RN. Will document pt's food preference in [employer] for meals.  Dietitian #: [phone]  DOCUMENTATION CODES Per approved criteria  -Obesity Unspecified    [name] [name] 02/13/2012, 4:11 PM

## 2012-02-13 NOTE — Progress Notes (Signed)
CARE MANAGEMENT NOTE 02/13/2012  Patient:  Fludd,Shadana   Account Number:  [id number]  Date Initiated:  02/13/2012  Documentation initiated by:  Emil Weigold  Subjective/Objective Assessment:   pt just dcd from hospital on [id number] retruned smae day after spiking temp of 103. cxr poss pna     Action/Plan:   [facility]   Anticipated DC Date:  02/16/2012   Anticipated DC Plan:  SKILLED NURSING FACILITY  In-house referral  Clinical Social Worker      DC Planning Services  NA      [facility] Choice  NA   Choice offered to / List presented to:  NA   DME arranged  NA      DME agency  NA     HH arranged  NA      HH agency  NA   Status of service:  In process, will continue to follow Medicare Important Message given?  NA - LOS <3 / Initial given by admissions (If response is "NO", the following Medicare IM given date fields will be blank) Date Medicare IM given:   Date Additional Medicare IM given:    Discharge Disposition:    Per UR Regulation:  Reviewed for med. necessity/level of care/duration of stay  Comments:  [id number]/Shoua Ulloa,RN,BSN,CCN

## 2012-02-14 DIAGNOSIS — I517 Cardiomegaly: Secondary | ICD-10-CM

## 2012-02-14 LAB — CBC
MCH: 24.4 pg — ABNORMAL LOW (ref 26.0–34.0)
MCHC: 31 g/dL (ref 30.0–36.0)
MCV: 78.5 fL (ref 78.0–100.0)
Platelets: 480 10*3/uL — ABNORMAL HIGH (ref 150–400)
RDW: 20.4 % — ABNORMAL HIGH (ref 11.5–15.5)

## 2012-02-14 LAB — COMPREHENSIVE METABOLIC PANEL
AST: 15 U/L (ref 0–37)
Albumin: 2 g/dL — ABNORMAL LOW (ref 3.5–5.2)
Calcium: 9.4 mg/dL (ref 8.4–10.5)
Creatinine, Ser: 0.6 mg/dL (ref 0.50–1.10)
GFR calc non Af Amer: >90 mL/min (ref >90)

## 2012-02-14 LAB — GLUCOSE, CAPILLARY: Glucose-Capillary: 90 mg/dL (ref 70–99)

## 2012-02-14 MED ORDER — PERFLUTREN LIPID MICROSPHERE 6.52 MG/ML IV SUSP
10.0000 uL/kg | Freq: Once | INTRAVENOUS | Status: AC
  Administered 2012-02-14: 0.935 mg via INTRAVENOUS
  Filled 2012-02-14: qty 2

## 2012-02-14 MED ORDER — VANCOMYCIN HCL 1000 MG IV SOLR
750.0000 mg | Freq: Two times a day (BID) | INTRAVENOUS | Status: DC
  Administered 2012-02-15 – 2012-02-18 (×7): 750 mg via INTRAVENOUS
  Filled 2012-02-14 (×9): qty 750

## 2012-02-14 NOTE — Progress Notes (Signed)
Lab called with results of the blood cultures from the anaerobic bottle.  It showed gram positive cocci in clusters. Sent text page to [facility] Coverage MD on call with no return page at this time. Patient on 3 IV antibiotics.  Will continue to monitor patient. Manson Passey, Wilkins Elpers Cherie

## 2012-02-14 NOTE — Progress Notes (Signed)
ANTIBIOTIC CONSULT NOTE - INITIAL  Pharmacy Consult for Vancomycin/Zosyn Indication: PNA, UTI, CNS bacteremia  No Known Allergies  Patient Measurements: Height: 5' 4.96" (165 cm) Weight: 187 lb 9.8 oz (85.1 kg) IBW/kg (Calculated) : 56.91   Vital Signs: Temp: 97.7 F (36.5 C) (02/21 0634) Temp src: Oral (02/21 0634) BP: 124/77 mmHg (02/21 0634) Pulse Rate: 92  (02/21 0634) Intake/Output from previous day: 02/20 0701 - 02/21 0700 In: 1290 [P.O.:240; I.V.:600; IV Piggyback:450] Out: 4075 [Urine:4075] Intake/Output from this shift:    Labs:  Basename 02/14/12 0430 02/13/12 0443 02/12/12 1815  WBC 8.8 11.3* 14.4*  HGB 8.5* 8.5* 7.8*  PLT 480* 443* 320  LABCREA -- -- --  CREATININE 0.60 0.62 0.59   Estimated Creatinine Clearance: 70.4 ml/min (by C-G formula based on Cr of 0.6).  Basename 02/14/12 1001  VANCOTROUGH 21.7*  VANCOPEAK --  Drue Dun --  GENTTROUGH --  GENTPEAK --  GENTRANDOM --  TOBRATROUGH --  TOBRAPEAK --  TOBRARND --  AMIKACINPEAK --  AMIKACINTROU --  AMIKACIN --     Microbiology: Recent Results (from the past 720 hour(s))  CULTURE, BLOOD (ROUTINE X 2)     Status: Normal   Collection Time   02/02/12 11:55 AM      Component Value Range Status Comment   Specimen Description BLOOD LEFT WRIST  5 ML IN [facility] BOTTLE   Final    Special Requests NONE   Final    Culture  Setup Time [id number]   Final    Culture     Final    Value: STAPHYLOCOCCUS SPECIES (COAGULASE NEGATIVE)     Note: SUSCEPTIBILITIES PERFORMED ON PREVIOUS CULTURE WITHIN THE LAST 5 DAYS.     Note: Gram Stain Report Called to,Read Back By and Verified With: RN A. ARNOLD ON 02/03/12 AT 1528 BY TEDAR   Report Status 02/05/2012 FINAL   Final   CULTURE, BLOOD (ROUTINE X 2)     Status: Normal   Collection Time   02/02/12 12:40 PM      Component Value Range Status Comment   Specimen Description BLOOD LEFT FEMORAL  5 ML IN [facility] BOTTLE   Final    Special Requests NONE   Final    Culture   Setup Time [id number]   Final    Culture     Final    Value: STAPHYLOCOCCUS SPECIES (COAGULASE NEGATIVE)     Note: RIFAMPIN AND GENTAMICIN SHOULD NOT BE USED AS SINGLE DRUGS FOR TREATMENT OF STAPH INFECTIONS. This organism DOES NOT demonstrate inducible Clindamycin resistance in vitro.     Note: Gram Stain Report Called to,Read Back By and Verified With: RN A. ARNOLD ON 02/03/12 AT 1528 BY TEDAR   Report Status 02/05/2012 FINAL   Final    Organism ID, Bacteria STAPHYLOCOCCUS SPECIES (COAGULASE NEGATIVE)   Final   URINE CULTURE     Status: Normal   Collection Time   02/02/12  1:06 PM      Component Value Range Status Comment   Specimen Description URINE, CLEAN CATCH   Final    Special Requests NONE   Final    Culture  Setup Time [id number]   Final    Colony Count >=100,000 COLONIES/ML   Final    Culture     Final    Value: PROVIDENCIA STUARTII     PROTEUS MIRABILIS   Report Status 02/05/2012 FINAL   Final    Organism ID, Bacteria PROVIDENCIA STUARTII   Final    Organism  ID, Bacteria PROTEUS MIRABILIS   Final   MRSA PCR SCREENING     Status: Normal   Collection Time   02/02/12  4:00 PM      Component Value Range Status Comment   MRSA by PCR NEGATIVE  NEGATIVE  Final   CULTURE, BLOOD (ROUTINE X 2)     Status: Normal   Collection Time   02/06/12  8:35 PM      Component Value Range Status Comment   Specimen Description BLOOD LEFT ARM   Final    Special Requests BOTTLES DRAWN AEROBIC AND ANAEROBIC 4CC   Final    Culture  Setup Time [id number]   Final    Culture     Final    Value:        BLOOD CULTURE RECEIVED NO GROWTH TO DATE CULTURE WILL BE HELD FOR 5 DAYS BEFORE ISSUING A FINAL NEGATIVE REPORT   Report Status 02/13/2012 FINAL   Final   CULTURE, BLOOD (ROUTINE X 2)     Status: Normal   Collection Time   02/06/12  8:45 PM      Component Value Range Status Comment   Specimen Description BLOOD LEFT ARM   Final    Special Requests BOTTLES DRAWN AEROBIC AND ANAEROBIC 2CC   Final     Culture  Setup Time [id number]   Final    Culture     Final    Value:        BLOOD CULTURE RECEIVED NO GROWTH TO DATE CULTURE WILL BE HELD FOR 5 DAYS BEFORE ISSUING A FINAL NEGATIVE REPORT   Report Status 02/13/2012 FINAL   Final   CULTURE, BLOOD (ROUTINE X 2)     Status: Normal (Preliminary result)   Collection Time   02/12/12  6:10 PM      Component Value Range Status Comment   Specimen Description BLOOD LEFT ARM   Final    Special Requests BOTTLES DRAWN AEROBIC ONLY   Final    Culture  Setup Time [id number]   Final    Culture     Final    Value:        BLOOD CULTURE RECEIVED NO GROWTH TO DATE CULTURE WILL BE HELD FOR 5 DAYS BEFORE ISSUING A FINAL NEGATIVE REPORT   Report Status PENDING   Incomplete   CULTURE, BLOOD (ROUTINE X 2)     Status: Normal (Preliminary result)   Collection Time   02/12/12  6:15 PM      Component Value Range Status Comment   Specimen Description BLOOD LEFT HAND   Final    Special Requests BOTTLES DRAWN AEROBIC AND ANAEROBIC   Final    Culture  Setup Time [id number]   Final    Culture     Final    Value: GRAM POSITIVE COCCI IN CLUSTERS     Note: Gram Stain Report Called to,Read Back By and Verified With: DAWN BROWN @ 0057 ON 02/14/12 BY GOLLD   Report Status PENDING   Incomplete   URINE CULTURE     Status: Normal   Collection Time   02/12/12  6:17 PM      Component Value Range Status Comment   Specimen Description URINE, CATHETERIZED   Final    Special Requests NONE   Final    Culture  Setup Time [id number]   Final    Colony Count >=100,000 COLONIES/ML   Final    Culture     Final  Value: Multiple bacterial morphotypes present, none predominant. Suggest appropriate recollection if clinically indicated.   Report Status 02/13/2012 FINAL   Final     Medical History: Past Medical History  Diagnosis Date  . Multiple sclerosis   . GERD (gastroesophageal reflux disease)   . Depression   . Recurrent UTI   . Recurrent pneumonia   . Chronic  constipation   . Diverticulosis     Assessment:  Day 3 Vanc/Zosyn/Levaquin.   Vanc Trough slightly supratherapeutic  Afebrile  Improved WBC  Stable SCr  Goal of Therapy:  Vancomycin trough level 15-20 mcg/ml  Plan:  1. Reduce Vancomycin from 1g IV q12 to 750mg  IV q12 2. Continue Zosyn 3.375g IV q8 (extended interval infusion)   Hessie Knows, PharmD, BCPS pager [phone] 02/14/2012 11:11 AM

## 2012-02-14 NOTE — Evaluation (Signed)
Physical Therapy Evaluation Patient Details Name: [name] [name] MRN: [id number] DOB: [birth year] Today's Date: 02/14/2012  Problem List:  Patient Active Problem List  Diagnoses  . HERPES ZOSTER W/NERVOUS COMPLICATION NEC  . CANDIDIASIS  . OBESITY  . DEPRESSION  . CAUSALGIA  . PERIPHERAL VASCULAR DISEASE  . URI  . PNEUMONIA  . BRONCHITIS NOS  . GASTROESOPHAGEAL REFLUX DISEASE  . RENAL FAILURE, ACUTE  . CYSTITIS  . PRESSURE ULCER HIP  . ULCER OF ANKLE  . LEG PAIN, LEFT  . OSTEOPENIA  . FATIGUE  . OTHER GENERAL SYMPTOMS  . SKIN RASH  . EDEMA LEG  . WEIGHT LOSS  . DYSURIA  . OTHER CLOSED FRACTURES OF UPPER END OF HUMERUS  . LIVER FUNCTION TESTS, ABNORMAL, HX OF  . CHOLECYSTECTOMY, HX OF  . POSTMENOPAUSAL STATUS  . Multiple sclerosis  . Femoral shaft fracture  . Hemorrhagic shock  . Infection due to urinary indwelling catheter  . Septic shock  . Hypokalemia  . Elevated LFTs  . Normocytic anemia  . Hypoglycemia  . ARF (acute renal failure)  . Hyperkalemia  . Hospital acquired PNA  . Thrush  . Left arm swelling  . Superficial venous thrombosis of arm  . Anorexia  . Obesity (BMI 30-39.9)  . Weakness generalized  . Leukocytosis  . Anemia due to chronic illness  . UTI (lower urinary tract infection)    Past Medical History:  Past Medical History  Diagnosis Date  . Multiple sclerosis   . GERD (gastroesophageal reflux disease)   . Depression   . Recurrent UTI   . Recurrent pneumonia   . Chronic constipation   . Diverticulosis    Past Surgical History:  Past Surgical History  Procedure Date  . Cholecystectomy   . Orif humeral shaft fracture 01/2011  . Video assisted thoracoscopy 06/2010  . Closed reduction shoulder dislocation 04/2010    Fracture dislocation  . Femur im nail 01/13/2012    Procedure: INTRAMEDULLARY (IM) RETROGRADE FEMORAL NAILING;  Surgeon: Thera Flake., MD;  Location: [facility] OR;  Service: Orthopedics;  Laterality: Left;  . Femoral artery  exploration 01/13/2012    Procedure: FEMORAL ARTERY EXPLORATION;  Surgeon: Pryor Ochoa, MD;  Location: [facility] OR;  Service: Vascular;  Laterality: Left;    PT Assessment/Plan/Recommendation PT Assessment Clinical Impression Statement: Pt presents with diagnosis of weakness. Pt currently demonstrating significant weakness-requiring +2 assist for sitting EOB. Pt had L femoral fx with IM nail recently-12/2011. Weightbearing status not clarified although note from Dr. Darnelle Catalan on last visit states she "can bear some weight." Pt states she was not aware of being able to weightbear on L LE. PT Recommendation/Assessment: Patient will need skilled PT in the acute care venue PT Problem List: Decreased strength;Decreased range of motion;Decreased activity tolerance;Decreased balance;Decreased mobility;Decreased coordination;Decreased cognition;Decreased knowledge of use of DME;Pain PT Therapy Diagnosis : Generalized weakness PT Plan PT Frequency: Min 2X/week PT Treatment/Interventions: Functional mobility training;Therapeutic activities;Therapeutic exercise;Balance training;Patient/family education PT Recommendation Recommendations for Other Services: OT consult Follow Up Recommendations: Skilled nursing facility Equipment Recommended: Defer to next venue PT Goals  Acute Rehab PT Goals PT Goal Formulation: With patient Time For Goal Achievement: 2 weeks Pt will Roll Supine to Right Side: with max assist PT Goal: Rolling Supine to Right Side - Progress: Goal set today Pt will Roll Supine to Left Side: with max assist PT Goal: Rolling Supine to Left Side - Progress: Goal set today Pt will go Supine/Side to Sit: with max assist PT Goal:  Supine/Side to Sit - Progress: Goal set today Pt will go Sit to Supine/Side: with max assist PT Goal: Sit to Supine/Side - Progress: Goal set today Pt will Transfer Bed to Chair/Chair to Bed: with +2 total assist (Pt=50%) PT Transfer Goal: Bed to Chair/Chair to Bed -  Progress: Goal set today  PT Evaluation Precautions/Restrictions  Precautions Precautions: Fall Restrictions Weight Bearing Restrictions: Yes Other Position/Activity Restrictions: per Dr Darnelle Catalan progress note on 02/11/12-per Dr Madelon Lips, pt can bear weight on L LE (but weight not specified) Prior Functioning  Home Living Type of Home: Skilled Nursing Facility [facility] Prior Function Level of Independence: Needs assistance with tranfers;Needs assistance with ADLs Cognition Cognition Arousal/Alertness: Lethargic Overall Cognitive Status: Impaired Memory: Appears impaired Memory Deficits: Pt unable to recall time frames of hospital admissions.  Orientation Level: Oriented to person;Oriented to place Sensation/Coordination Sensation Light Touch: Appears Intact Coordination Gross Motor Movements are Fluid and Coordinated: No Extremity Assessment RLE Assessment RLE Assessment: Exceptions to [facility] RLE Strength RLE Overall Strength: Deficits RLE Overall Strength Comments: Strength 2/5 throughout LLE Assessment LLE Assessment: Exceptions to [facility] LLE Strength LLE Overall Strength: Deficits LLE Overall Strength Comments: Strength 2-/5 throughout Mobility (including Balance) Bed Mobility Bed Mobility: Yes Supine to Sit: 1: +2 Total assist (Pt=15%) Supine to Sit Details (indicate cue type and reason): VCs safety, technique, hand placement. Assist for trunk to upright and bil LEs off EOB Sit to Supine: 1: +2 Total assist (Pt=0%) Sit to Supine - Details (indicate cue type and reason): VCs safety, technique, hand placement. Assist for trunk to supine and bil LEs onto bed Transfers Transfers: No  Posture/Postural Control Posture/Postural Control: Postural limitations Postural Limitations: +2 assist for uprights sitting posture Balance Balance Assessed: Yes Static Sitting Balance Static Sitting - Balance Support: Bilateral upper extremity supported;Feet supported Static Sitting - Level  of Assistance: 1: +2 Total assist (Pt=30%) Static Sitting - Comment/# of Minutes: VCs safety, hand placement. Encouraged increased use of UEs for support. Sat EOB3-4 minutes-worked on anterior weight-shitting and lateral weight-shifting for correct upright posture. Pt fatigued easily and c/o L Le and back pain. Pt requested to return to supine Exercise    End of Session PT - End of Session Activity Tolerance: Patient limited by fatigue;Patient limited by pain Patient left: in bed;with call bell in reach General Behavior During Session: [facility] for tasks performed Cognition: Impaired  Rebeca Alert [facility] 02/14/2012, 4:18 PM [phone]

## 2012-02-14 NOTE — Progress Notes (Signed)
PROGRESS NOTE  [name] [name] ZOX:[id number] DOB: 10-01-1941 DOA: 02/12/2012 PCP: Thane Edu, MD, MD  Brief narrative: 71 year old woman discharged from [facility] February 19 after a ten-day stay for: Septic shock, hypokalemia, elevated liver function tests, acute renal failure, hospital acquired pneumonia. Discharged on cefuroxime for an additional 3 days. Discharged to a skilled nursing facility.  Patient was initially admitted by critical care for sepsis. Blood cultures were positive for coag negative staph and urine culture also was noted to be positive for Proteus and Providentia. Antibiotics were narrowed Rocephin. Shortly thereafter the patient developed sepsis and antibiotics were broadened to cover aspiration pneumonia or healthcare acquired pneumonia. Antibiotic subsequently narrowed to Ceftin.  Past medical history: Multiple sclerosis, chronic indwelling Foley catheter, depression, pneumonia, constipation  Consultants:  None   Procedures:  None   Antibiotics:    Interim History: Yesterday he complained of some tenderness in the abdomen and pleuritic pain in chest. Afebrile, vital signs stable. Oxygen saturation 99% on 2 L nasal cannula. Cardiac enzymes negative. Leukocytosis resolved. Chest CT revealed pneumonia.  Subjective: Denies pain except mild abdominal tenderness on close questioning. No shortness of breath. Eating. No other complaints.  Objective: Filed Vitals:   02/13/12 0810 02/13/12 1404 02/13/12 2238 02/14/12 0634  BP: 116/75 117/75 102/63 124/77  Pulse: 89 93 96 92  Temp: 98.2 F (36.8 C) 97.5 F (36.4 C) 97.3 F (36.3 C) 97.7 F (36.5 C)  TempSrc: Oral Oral Oral Oral  Resp: 20 19 20 20   Height:      Weight:      SpO2: 99% 97% 96% 99%    Intake/Output Summary (Last 24 hours) at 02/14/12 0917 Last data filed at 02/14/12 0700  Gross per 24 hour  Intake   1290 ml  Output   4075 ml  Net  -2785 ml    Exam:   General:   Calm and comfortable.  Eyes: Pupils equal, round, reactive to light.  ENT: Grossly normal hearing.  Cardiovascular: Regular rate and rhythm. No murmur, rub or gallop. No lower extremity edema.   Telemetry: Sinus rhythm. No acute changes.   Respiratory:  Clear to auscultation bilaterally. No wheezes, rales, rhonchi. Normal respiratory effort.  Abdomen:  Soft, nontender, nondistended.  Skin:  Grossly unremarkable.  Musculoskeletal:  Grossly normal tone and strength in the upper trunk is bilaterally. Able to move lower extremities bilaterally to command.  Psychiatric:  Grossly normal mood and affect. Oriented to herself, location, month, year, recent events.  Neurologic:  Nonfocal.  Data Reviewed: Basic Metabolic Panel:  Lab 02/14/12 8119 02/13/12 0443 02/12/12 2035 02/12/12 1815 02/10/12 0411 02/08/12 0505  NA 136 135 -- 135 139 141  K 3.7 3.7 -- -- -- --  CL 103 105 -- 106 110 112  CO2 26 23 -- 22 23 23   GLUCOSE 99 92 -- 98 92 107*  BUN 10 9 -- 10 11 20   CREATININE 0.60 0.62 -- 0.59 0.48* 0.60  CALCIUM 9.4 8.8 -- 9.0 8.9 8.8  MG -- -- 1.7 -- -- --  PHOS -- -- 3.2 -- -- --   Liver Function Tests:  Lab 02/14/12 0430 02/12/12 1815 02/08/12 0505  AST 15 16 24   ALT 17 18 35  ALKPHOS 148* 152* 137*  BILITOT 0.4 0.4 0.4  PROT 6.1 5.7* 4.5*  ALBUMIN 2.0* 1.9* 1.6*   CBC:  Lab 02/14/12 0430 02/13/12 0443 02/12/12 1815 02/10/12 0411 02/08/12 0505  WBC 8.8 11.3* 14.4* 7.2 7.3  NEUTROABS -- -- 11.5* -- --  HGB 8.5* 8.5* 7.8* 7.5* 7.1*  HCT 27.4* 27.0* 24.6* 23.9* 22.5*  MCV 78.5 77.6* 77.8* 78.4 79.2  PLT 480* 443* 320 330 232   Cardiac Enzymes:  Lab 02/13/12 1151 02/13/12 0453 02/12/12 2035  CKTOTAL 15 7 7   CKMB 1.4 1.3 1.3  CKMBINDEX -- -- --  TROPONINI <0.30 <0.30 <0.30   CBG:  Lab 02/14/12 0742  GLUCAP 90    Recent Results (from the past 240 hour(s))  CULTURE, BLOOD (ROUTINE X 2)     Status: Normal   Collection Time   02/06/12  8:35 PM      Component  Value Range Status Comment   Specimen Description BLOOD LEFT ARM   Final    Special Requests BOTTLES DRAWN AEROBIC AND ANAEROBIC 4CC   Final    Culture  Setup Time [id number]   Final    Culture     Final    Value:        BLOOD CULTURE RECEIVED NO GROWTH TO DATE CULTURE WILL BE HELD FOR 5 DAYS BEFORE ISSUING A FINAL NEGATIVE REPORT   Report Status 02/13/2012 FINAL   Final   CULTURE, BLOOD (ROUTINE X 2)     Status: Normal   Collection Time   02/06/12  8:45 PM      Component Value Range Status Comment   Specimen Description BLOOD LEFT ARM   Final    Special Requests BOTTLES DRAWN AEROBIC AND ANAEROBIC 2CC   Final    Culture  Setup Time [id number]   Final    Culture     Final    Value:        BLOOD CULTURE RECEIVED NO GROWTH TO DATE CULTURE WILL BE HELD FOR 5 DAYS BEFORE ISSUING A FINAL NEGATIVE REPORT   Report Status 02/13/2012 FINAL   Final   CULTURE, BLOOD (ROUTINE X 2)     Status: Normal (Preliminary result)   Collection Time   02/12/12  6:15 PM      Component Value Range Status Comment   Specimen Description BLOOD LEFT HAND   Final    Special Requests BOTTLES DRAWN AEROBIC AND ANAEROBIC   Final    Culture  Setup Time [id number]   Final    Culture     Final    Value: GRAM POSITIVE COCCI IN CLUSTERS     Note: Gram Stain Report Called to,Read Back By and Verified With: DAWN BROWN @ 0057 ON 02/14/12 BY GOLLD   Report Status PENDING   Incomplete   URINE CULTURE     Status: Normal   Collection Time   02/12/12  6:17 PM      Component Value Range Status Comment   Specimen Description URINE, CATHETERIZED   Final    Special Requests NONE   Final    Culture  Setup Time [id number]   Final    Colony Count >=100,000 COLONIES/ML   Final    Culture     Final    Value: Multiple bacterial morphotypes present, none predominant. Suggest appropriate recollection if clinically indicated.   Report Status 02/13/2012 FINAL   Final      Studies: Dg Abd 1 View  02/13/2012  *RADIOLOGY REPORT*   Clinical Data: Abdominal tenderness.  ABDOMEN - 1 VIEW  Comparison: 01/12/2012  Findings: Band-like opacity noted in the left lower lobe with retro diaphragmatic opacities bilaterally.  I cannot exclude pneumonia.  Levoconvex rotary scoliosis the lumbar spine may be positional.  Degenerative arthropathy of the hips  noted.  Calcified structure in the left pelvis may represent a calcified fibroid or vascular calcification.  Calcified ovarian lesion is considered less likely.  Scattered gas is present in the colon.  No dilated small bowel is observed.  The upper margin of a retrograde intramedullary rod of the left femur is noted.  IMPRESSION:  1.  Bilateral lower lobe airspace opacities, nonspecific. 2.  Lumbar spondylosis and scoliosis. 3.  Degenerative arthropathy of the hips bilaterally. 4.  Calcified structure in the left pelvis is probably a calcified uterine fibroid.  Vascular calcification or calcified ovarian lesion are considered statistically less likely.  Original Report Authenticated By: Dellia Cloud, M.D.   Ct Chest W Contrast  02/13/2012  *RADIOLOGY REPORT*  Clinical Data: Pleuritic chest pain.  Cough.  CT CHEST WITH CONTRAST  Technique:  Multidetector CT imaging of the chest was performed following the standard protocol during bolus administration of intravenous contrast.  Contrast: 80mL OMNIPAQUE IOHEXOL 300 MG/ML IV SOLN  Comparison: Portable chest obtained yesterday.  Findings: Small right pleural effusion.  Posterior atelectasis in both lower lobes, greater on the right.  There is also patchy opacity in the right perihilar region and more superiorly in the right upper lobe.  There is a small amount of patchy opacity in the right middle lobe, medially.  A small amount of patchy opacity is also noted in the right lower lobe.  No masses or enlarged lymph nodes are seen.  The upper abdomen is unremarkable.  Mild thoracic spine degenerative changes.  IMPRESSION:  1.  Multifocal right lung  pneumonia, most pronounced in the perihilar region. 2.  Small right pleural effusion. 3.  Bilateral lower lobe atelectasis, greater on the right.  Original Report Authenticated By: Darrol Angel M.D.    Scheduled Meds:   . amitriptyline  100 mg Oral QHS  . dalfampridine  10 mg Oral BID  . docusate sodium  200 mg Oral BID  . enoxaparin  40 mg Subcutaneous Q24H  . feeding supplement  1 Container Oral BID BM  . feeding supplement  30 mL Oral BID  . ferrous sulfate  325 mg Oral BID PC  . FLUoxetine  40 mg Oral Daily  . gabapentin  300 mg Oral Custom  . gabapentin  600 mg Oral Q1200  . levofloxacin (LEVAQUIN) IV  750 mg Intravenous Q24H  . pantoprazole  40 mg Oral Q1200  . piperacillin-tazobactam (ZOSYN)  IV  3.375 g Intravenous Q8H  . sodium chloride  3 mL Intravenous Q12H  . vancomycin  1,000 mg Intravenous Q12H   Continuous Infusions:   . sodium chloride 50 mL/hr at 02/14/12 0529     Assessment/Plan: 1. Acute encephalopathy: Resolved. Not actually noted on examination by the admitting physician the patient was found to be alert and oriented although family felt the patient was altered. Likely secondary to acute infection. 2. UTI: Final urine culture with multiple bacterial morphotypes present, none predominant. Of note the patient has a chronic indwelling Foley catheter making interpretation of urinalysis difficult. 3. Pneumonia: Broad-spectrum antibiotics. Wean oxygen. 4. Recurrent bacteremia: Gram-positive cocci in clusters in one out of 2 bottles. Suspect recurrence of coag-negative staph bacteremia. Plan as below. 5. Coag-negative staph bacteremia: Found on blood cultures from February 9 the day of patient's previous admission. By report the patient presented from home at that time. Central line was placed on February 9 by did not have any other noted lines. Etiology of the bacteremia would be unclear at this point.  Repeat cultures February 13th were negative. Patient has no  symptoms referrable to previous joint surgeries. No murmurs heard on examination. Obtain 2-D echocardiogram. Consider infectious disease consultation pending further evaluation. 6. Abdominal pain: Does not appear to be significant based on examination and laboratory testing. Elevated alkaline phosphatase: First noted October 2010. Followup as outpatient. 7. Generalized weakness: Physical therapy consultation. 8. Anemia: Chronic normocytic. Stable. 9. History multiple sclerosis: Stable.  Code Status: DO NOT RESUSCITATE(vasopressors and antiarrhythmics acceptable) Family Communication: Daughter Michele Mcalpine [phone], [phone], [phone] Disposition Plan: Return to skilled nursing facility when improved.   Brendia Sacks, MD  [facility] Pager [phone] 02/14/2012, 9:17 AM    LOS: 2 days

## 2012-02-14 NOTE — Progress Notes (Signed)
*  PRELIMINARY RESULTS* Echocardiogram 2D Echocardiogram has been performed.  Glean Salen [facility] 02/14/2012, 2:17 PM

## 2012-02-15 LAB — GLUCOSE, CAPILLARY: Glucose-Capillary: 88 mg/dL (ref 70–99)

## 2012-02-15 LAB — CBC
HCT: 29 % — ABNORMAL LOW (ref 36.0–46.0)
Hemoglobin: 9 g/dL — ABNORMAL LOW (ref 12.0–15.0)
RDW: 20.4 % — ABNORMAL HIGH (ref 11.5–15.5)
WBC: 6.9 10*3/uL (ref 4.0–10.5)

## 2012-02-15 LAB — COMPREHENSIVE METABOLIC PANEL
ALT: 13 U/L (ref 0–35)
Albumin: 2 g/dL — ABNORMAL LOW (ref 3.5–5.2)
Alkaline Phosphatase: 140 U/L — ABNORMAL HIGH (ref 39–117)
BUN: 9 mg/dL (ref 6–23)
Chloride: 101 mEq/L (ref 96–112)
Potassium: 3.7 mEq/L (ref 3.5–5.1)
Sodium: 134 mEq/L — ABNORMAL LOW (ref 135–145)
Total Bilirubin: 0.4 mg/dL (ref 0.3–1.2)

## 2012-02-15 MED ORDER — GABAPENTIN 300 MG PO CAPS
600.0000 mg | ORAL_CAPSULE | Freq: Three times a day (TID) | ORAL | Status: DC
  Administered 2012-02-15 – 2012-02-19 (×12): 600 mg via ORAL
  Filled 2012-02-15 (×14): qty 2

## 2012-02-15 NOTE — Progress Notes (Signed)
CSW assisting with d/c planning. Pt plans to return to [facility] when stable. Message left for Admissions at [facility] to see if they will accept pt on SAT if stable for d/c. Awaiting return call from SNF. Will follow .

## 2012-02-15 NOTE — Progress Notes (Signed)
[facility] is unable to accept this weekend. A SNF bed will be available at [facility] on Monday, if stable for d/c. CSW will assist with d/c planning to SNF.

## 2012-02-15 NOTE — Progress Notes (Signed)
Subjective: Pleasant complaining of neuropathic pain.   Objective: Weight change:   Intake/Output Summary (Last 24 hours) at 02/15/12 1702 Last data filed at 02/15/12 1500  Gross per 24 hour  Intake    620 ml  Output   2525 ml  Net  -1905 ml    Filed Vitals:   02/15/12 0510  BP: 123/70  Pulse: 101  Temp: 98.7 F (37.1 C)  Resp: 18   On exam she is alert afebrile comfortable CVS s1s2 heard Lungs clear abd soft non tender bowel sounds heard extremties no pedal edema.   Lab Results: Results for orders placed during the hospital encounter of 02/12/12 (from the past 24 hour(s))  CBC     Status: Abnormal   Collection Time   02/15/12  4:25 AM      Component Value Range   WBC 6.9  4.0 - 10.5 (K/uL)   RBC 3.68 (*) 3.87 - 5.11 (MIL/uL)   Hemoglobin 9.0 (*) 12.0 - 15.0 (g/dL)   HCT 16.1 (*) 09.6 - 46.0 (%)   MCV 78.8  78.0 - 100.0 (fL)   MCH 24.5 (*) 26.0 - 34.0 (pg)   MCHC 31.0  30.0 - 36.0 (g/dL)   RDW 04.5 (*) 40.9 - 15.5 (%)   Platelets 520 (*) 150 - 400 (K/uL)  COMPREHENSIVE METABOLIC PANEL     Status: Abnormal   Collection Time   02/15/12  4:25 AM      Component Value Range   Sodium 134 (*) 135 - 145 (mEq/L)   Potassium 3.7  3.5 - 5.1 (mEq/L)   Chloride 101  96 - 112 (mEq/L)   CO2 28  19 - 32 (mEq/L)   Glucose, Bld 90  70 - 99 (mg/dL)   BUN 9  6 - 23 (mg/dL)   Creatinine, Ser 8.11  0.50 - 1.10 (mg/dL)   Calcium 9.4  8.4 - 91.4 (mg/dL)   Total Protein 6.2  6.0 - 8.3 (g/dL)   Albumin 2.0 (*) 3.5 - 5.2 (g/dL)   AST 15  0 - 37 (U/L)   ALT 13  0 - 35 (U/L)   Alkaline Phosphatase 140 (*) 39 - 117 (U/L)   Total Bilirubin 0.4  0.3 - 1.2 (mg/dL)   GFR calc non Af Amer 90 (*) >90 (mL/min)   GFR calc Af Amer >90  >90 (mL/min)  GLUCOSE, CAPILLARY     Status: Normal   Collection Time   02/15/12  7:44 AM      Component Value Range   Glucose-Capillary 88  70 - 99 (mg/dL)   Comment 1 Documented in Chart     Comment 2 Notify RN       Micro Results: Recent Results  (from the past 240 hour(s))  CULTURE, BLOOD (ROUTINE X 2)     Status: Normal   Collection Time   02/06/12  8:35 PM      Component Value Range Status Comment   Specimen Description BLOOD LEFT ARM   Final    Special Requests BOTTLES DRAWN AEROBIC AND ANAEROBIC 4CC   Final    Culture  Setup Time [id number]   Final    Culture     Final    Value:        BLOOD CULTURE RECEIVED NO GROWTH TO DATE CULTURE WILL BE HELD FOR 5 DAYS BEFORE ISSUING A FINAL NEGATIVE REPORT   Report Status 02/13/2012 FINAL   Final   CULTURE, BLOOD (ROUTINE X 2)     Status:  Normal   Collection Time   02/06/12  8:45 PM      Component Value Range Status Comment   Specimen Description BLOOD LEFT ARM   Final    Special Requests BOTTLES DRAWN AEROBIC AND ANAEROBIC 2CC   Final    Culture  Setup Time [id number]   Final    Culture     Final    Value:        BLOOD CULTURE RECEIVED NO GROWTH TO DATE CULTURE WILL BE HELD FOR 5 DAYS BEFORE ISSUING A FINAL NEGATIVE REPORT   Report Status 02/13/2012 FINAL   Final   CULTURE, BLOOD (ROUTINE X 2)     Status: Normal (Preliminary result)   Collection Time   02/12/12  6:10 PM      Component Value Range Status Comment   Specimen Description BLOOD LEFT ARM   Final    Special Requests BOTTLES DRAWN AEROBIC ONLY   Final    Culture  Setup Time [id number]   Final    Culture     Final    Value:        BLOOD CULTURE RECEIVED NO GROWTH TO DATE CULTURE WILL BE HELD FOR 5 DAYS BEFORE ISSUING A FINAL NEGATIVE REPORT   Report Status PENDING   Incomplete   CULTURE, BLOOD (ROUTINE X 2)     Status: Normal   Collection Time   02/12/12  6:15 PM      Component Value Range Status Comment   Specimen Description BLOOD LEFT HAND   Final    Special Requests BOTTLES DRAWN AEROBIC AND ANAEROBIC   Final    Culture  Setup Time [id number]   Final    Culture     Final    Value: STAPHYLOCOCCUS SPECIES (COAGULASE NEGATIVE)     Note: THE SIGNIFICANCE OF ISOLATING THIS ORGANISM FROM A SINGLE SET OF  BLOOD CULTURES WHEN MULTIPLE SETS ARE DRAWN IS UNCERTAIN. PLEASE NOTIFY THE MICROBIOLOGY DEPARTMENT WITHIN ONE WEEK IF SPECIATION AND SENSITIVITIES ARE REQUIRED.     Note: Gram Stain Report Called to,Read Back By and Verified With: DAWN BROWN @ 0057 ON 02/14/12 BY GOLLD   Report Status 02/15/2012 FINAL   Final   URINE CULTURE     Status: Normal   Collection Time   02/12/12  6:17 PM      Component Value Range Status Comment   Specimen Description URINE, CATHETERIZED   Final    Special Requests NONE   Final    Culture  Setup Time [id number]   Final    Colony Count >=100,000 COLONIES/ML   Final    Culture     Final    Value: Multiple bacterial morphotypes present, none predominant. Suggest appropriate recollection if clinically indicated.   Report Status 02/13/2012 FINAL   Final     Studies/Results: Dg Chest 1 View  02/08/2012  *RADIOLOGY REPORT*  Clinical Data: Recheck for pneumonia.  No chest pain or cough.  CHEST - 1 VIEW  Comparison: 02/07/2012  Findings: The patient has right-sided PICC line, tip to the level of the superior vena cava.  There continues to be patchy density within the right lung and medial left lung base with little change from prior study.  Prior left humerus ORIF.  IMPRESSION: Little interval change.  Original Report Authenticated By: Patterson Hammersmith, M.D.   Dg Abd 1 View  02/13/2012  *RADIOLOGY REPORT*  Clinical Data: Abdominal tenderness.  ABDOMEN - 1 VIEW  Comparison: 01/12/2012  Findings:  Band-like opacity noted in the left lower lobe with retro diaphragmatic opacities bilaterally.  I cannot exclude pneumonia.  Levoconvex rotary scoliosis the lumbar spine may be positional.  Degenerative arthropathy of the hips noted.  Calcified structure in the left pelvis may represent a calcified fibroid or vascular calcification.  Calcified ovarian lesion is considered less likely.  Scattered gas is present in the colon.  No dilated small bowel is observed.  The upper margin of a  retrograde intramedullary rod of the left femur is noted.  IMPRESSION:  1.  Bilateral lower lobe airspace opacities, nonspecific. 2.  Lumbar spondylosis and scoliosis. 3.  Degenerative arthropathy of the hips bilaterally. 4.  Calcified structure in the left pelvis is probably a calcified uterine fibroid.  Vascular calcification or calcified ovarian lesion are considered statistically less likely.  Original Report Authenticated By: Dellia Cloud, M.D.   Ct Chest W Contrast  02/13/2012  *RADIOLOGY REPORT*  Clinical Data: Pleuritic chest pain.  Cough.  CT CHEST WITH CONTRAST  Technique:  Multidetector CT imaging of the chest was performed following the standard protocol during bolus administration of intravenous contrast.  Contrast: 80mL OMNIPAQUE IOHEXOL 300 MG/ML IV SOLN  Comparison: Portable chest obtained yesterday.  Findings: Small right pleural effusion.  Posterior atelectasis in both lower lobes, greater on the right.  There is also patchy opacity in the right perihilar region and more superiorly in the right upper lobe.  There is a small amount of patchy opacity in the right middle lobe, medially.  A small amount of patchy opacity is also noted in the right lower lobe.  No masses or enlarged lymph nodes are seen.  The upper abdomen is unremarkable.  Mild thoracic spine degenerative changes.  IMPRESSION:  1.  Multifocal right lung pneumonia, most pronounced in the perihilar region. 2.  Small right pleural effusion. 3.  Bilateral lower lobe atelectasis, greater on the right.  Original Report Authenticated By: Darrol Angel, M.D.   Dg Chest Port 1 View  02/12/2012  *RADIOLOGY REPORT*  Clinical Data: Fever  PORTABLE CHEST - 1 VIEW  Comparison: 02/08/2012  Findings: Decreased lung volumes with patchy right hilar and bibasilar airspace disease.  This could represent atelectasis versus pneumonia.  No effusion or pneumothorax.  No definite edema. Stable heart size.  Right PICC line remains.  IMPRESSION:  Right perihilar and basilar streaky atelectasis/airspace process. Decreased lung volumes.  Original Report Authenticated By: Judie Petit. Ruel Favors, M.D.   Dg Chest Port 1 View  02/07/2012  *RADIOLOGY REPORT*  Clinical Data: Shortness of breath  PORTABLE CHEST - 1 VIEW  Comparison: 02/06/2012  Findings: Shallow inspiration.  Cardiac enlargement with pulmonary vascular congestion and perihilar infiltrates.  Changes are similar to previous study.  No pneumothorax.  Right PICC catheter is unchanged in position.  IMPRESSION: Shallow inspiration with stable appearance of cardiac enlargement, pulmonary vascular congestion, and perihilar infiltration.  Original Report Authenticated By: Marlon Pel, M.D.   Dg Chest Port 1 View  02/06/2012  *RADIOLOGY REPORT*  Clinical Data: Increasing shortness of breath.  Decreasing oxygen saturation.  PORTABLE CHEST - 1 VIEW  Comparison: 02/06/2012 at 0416 hours  Findings: Shallow inspiration.  Borderline heart size and pulmonary vascularity.  Bilateral perihilar air space interstitial disease, greater on the right.  Changes could represent pneumonia or edema. Suggestion of blunting of the left costophrenic angle.  No pneumothorax.  Findings appear stable since the previous study. Interval placement of a right PICC catheter with tip over the low SVC  region.  IMPRESSION: Shallow inspiration with borderline heart size and pulmonary vascularity.  Perihilar air space interstitial disease could represent pneumonia or edema.  PICC line tip is over the low SVC region.  Original Report Authenticated By: Marlon Pel, M.D.   Dg Chest Port 1 View  02/06/2012  *RADIOLOGY REPORT*  Clinical Data: Atelectasis.  PORTABLE CHEST - 1 VIEW  Comparison: 02/05/2012.  Findings: Trachea is midline, given patient rotation.  Heart size is grossly stable.  There is diffuse bilateral air space disease, somewhat asymmetric on the right.  Left hemidiaphragm is elevated.  IMPRESSION: Diffuse bilateral  air space disease, somewhat asymmetric on the right, possibly due to edema.  Pneumonia is not excluded.  Original Report Authenticated By: Reyes Ivan, M.D.   Dg Chest Port 1 View  02/05/2012  *RADIOLOGY REPORT*  Clinical Data: Atelectasis.  PORTABLE CHEST - 1 VIEW  Comparison: 02/04/2012.  Findings: Low volume chest.  Basilar atelectasis.  Perihilar and basilar predominant airspace disease is present.  Cardiopericardial silhouette and mediastinal contours are unchanged.  Patient is rotated to the right. Monitoring leads are projected over the chest.  IMPRESSION: No interval change.  Original Report Authenticated By: Andreas Newport, M.D.   Dg Chest Port 1 View  02/04/2012  *RADIOLOGY REPORT*  Clinical Data: Follow-up effusions.  PORTABLE CHEST - 1 VIEW  Comparison: 02/03/2012  Findings: Cardiac size is within normal limits.  The patient is rotated towards the left.  Shallow inflation.  Patchy bilateral infiltrates appears stable.  IMPRESSION: Persistent bilateral infiltrates.  Original Report Authenticated By: Patterson Hammersmith, M.D.   Portable Chest Xray In Am  02/03/2012  *RADIOLOGY REPORT*  Clinical Data: Pneumonia.  Edema.  PORTABLE CHEST - 1 VIEW  Comparison: 02/02/2012  Findings: Shallow inspiration.  Mild cardiac enlargement with some pulmonary vascular congestion.  Perihilar infiltrates suggesting edema or possibly pneumonia.  Blunting of the left costophrenic angle likely represents a small effusion.  No pneumothorax.  No significant change since previous study.  IMPRESSION: Shallow inspiration.  Cardiac enlargement with pulmonary vascular congestion and probable perihilar edema.  Small left pleural effusion.  Original Report Authenticated By: Marlon Pel, M.D.   Dg Chest Portable 1 View  02/02/2012  *RADIOLOGY REPORT*  Clinical Data: Sepsis and unresponsive.  PORTABLE CHEST - 1 VIEW  Comparison: 01/12/2012  Findings: Lung volumes are very low with significant atelectasis present.   It would be difficult to exclude subtle infiltrate given the compressed lungs.  There is no overt pulmonary edema.  There may be a tiny amount of left pleural fluid.  The heart size is within normal limits.  IMPRESSION: Extremely low lung volumes with bilateral atelectasis.  Potential tiny left pleural effusion.  Original Report Authenticated By: Reola Calkins, M.D.   Medications: Scheduled Meds:   . amitriptyline  100 mg Oral QHS  . dalfampridine  10 mg Oral BID  . docusate sodium  200 mg Oral BID  . enoxaparin  40 mg Subcutaneous Q24H  . feeding supplement  1 Container Oral BID BM  . feeding supplement  30 mL Oral BID  . ferrous sulfate  325 mg Oral BID PC  . FLUoxetine  40 mg Oral Daily  . gabapentin  600 mg Oral TID  . levofloxacin (LEVAQUIN) IV  750 mg Intravenous Q24H  . pantoprazole  40 mg Oral Q1200  . piperacillin-tazobactam (ZOSYN)  IV  3.375 g Intravenous Q8H  . sodium chloride  3 mL Intravenous Q12H  . vancomycin  750  mg Intravenous Q12H  . DISCONTD: gabapentin  300 mg Oral Custom  . DISCONTD: gabapentin  600 mg Oral Q1200   Continuous Infusions:  PRN Meds:.HYDROcodone-acetaminophen, ondansetron (ZOFRAN) IV, ondansetron  Assessment/Plan: 1. Acute encephalopathy: Resolved.Likely secondary to acute infection. 2. UTI: Final urine culture with multiple bacterial morphotypes present, none predominant. Of note the patient has a chronic indwelling Foley catheter making interpretation of urinalysis difficult. Repeat cultures ordered.  3. Pneumonia: will repeat cxr in am. Broad-spectrum antibiotics. Wean oxygen. 4. Recurrent bacteremia: Gram-positive cocci in clusters in one out of 2 bottles, growing coag staph , most likely contaminent.  5. Coag-negative staph bacteremia: Found on blood cultures from February 9 the day of patient's previous admission. By report the patient presented from home at that time. Central line was placed on February 9 by did not have any other noted  lines. Etiology of the bacteremia would be unclear at this point. Repeat cultures February 13th were negative. Patient has no symptoms referrable to previous joint surgeries. No murmurs heard on examination.  2d echo showed no vegetations.  Likely contaminant.    6. Abdominal pain: Does not appear to be significant based on examination and laboratory testing. Elevated alkaline phosphatase: First noted October 2010. Followup as outpatient. 7. Generalized weakness: Physical therapy consultation. 8. Anemia: Chronic normocytic. Stable. 9. History multiple sclerosis: Stable. 10. Worsening lower extremity neuropathy: increased the gabapentin 11. Disposition: awaIting bed at SNF.  LOS: 3 days   Ethridge Sollenberger 02/15/2012, 5:02 PM

## 2012-02-16 LAB — CBC
Hemoglobin: 8.9 g/dL — ABNORMAL LOW (ref 12.0–15.0)
MCH: 24.1 pg — ABNORMAL LOW (ref 26.0–34.0)
MCHC: 30.8 g/dL (ref 30.0–36.0)
Platelets: 559 10*3/uL — ABNORMAL HIGH (ref 150–400)
RDW: 20.3 % — ABNORMAL HIGH (ref 11.5–15.5)

## 2012-02-16 LAB — GLUCOSE, CAPILLARY: Glucose-Capillary: 99 mg/dL (ref 70–99)

## 2012-02-16 MED ORDER — VITAMINS A & D EX OINT
TOPICAL_OINTMENT | CUTANEOUS | Status: AC
  Administered 2012-02-16: 13:00:00
  Filled 2012-02-16: qty 5

## 2012-02-16 NOTE — Progress Notes (Signed)
Subjective:  PAIN IS BETTER.  Objective: Weight change:   Intake/Output Summary (Last 24 hours) at 02/16/12 1605 Last data filed at 02/16/12 1250  Gross per 24 hour  Intake   1010 ml  Output   2800 ml  Net  -1790 ml    Filed Vitals:   02/16/12 1250  BP: 95/65  Pulse: 92  Temp: 98.7 F (37.1 C)  Resp: 18   On exam she is alert afebrile comfortable  [employer] s1s2 heard  Lungs clear  abd soft non tender bowel sounds heard  extremties no pedal edema.    Lab Results: Results for orders placed during the hospital encounter of 02/12/12 (from the past 24 hour(s))  CBC     Status: Abnormal   Collection Time   02/16/12  5:26 AM      Component Value Range   WBC 8.3  4.0 - 10.5 (K/uL)   RBC 3.69 (*) 3.87 - 5.11 (MIL/uL)   Hemoglobin 8.9 (*) 12.0 - 15.0 (g/dL)   HCT 10.2 (*) 72.5 - 46.0 (%)   MCV 78.3  78.0 - 100.0 (fL)   MCH 24.1 (*) 26.0 - 34.0 (pg)   MCHC 30.8  30.0 - 36.0 (g/dL)   RDW 36.6 (*) 44.0 - 15.5 (%)   Platelets 559 (*) 150 - 400 (K/uL)  GLUCOSE, CAPILLARY     Status: Normal   Collection Time   02/16/12  7:21 AM      Component Value Range   Glucose-Capillary 99  70 - 99 (mg/dL)   Comment 1 Documented in Chart     Comment 2 Notify RN    GLUCOSE, CAPILLARY     Status: Abnormal   Collection Time   02/16/12 11:47 AM      Component Value Range   Glucose-Capillary 114 (*) 70 - 99 (mg/dL)   Comment 1 Notify RN     Comment 2 Documented in Chart      Micro Results: Recent Results (from the past 240 hour(s))  CULTURE, BLOOD (ROUTINE X 2)     Status: Normal   Collection Time   02/06/12  8:35 PM      Component Value Range Status Comment   Specimen Description BLOOD LEFT ARM   Final    Special Requests BOTTLES DRAWN AEROBIC AND ANAEROBIC 4CC   Final    Culture  Setup Time [id number]   Final    Culture     Final    Value:        BLOOD CULTURE RECEIVED NO GROWTH TO DATE CULTURE WILL BE HELD FOR 5 DAYS BEFORE ISSUING A FINAL NEGATIVE REPORT   Report Status 02/13/2012  FINAL   Final   CULTURE, BLOOD (ROUTINE X 2)     Status: Normal   Collection Time   02/06/12  8:45 PM      Component Value Range Status Comment   Specimen Description BLOOD LEFT ARM   Final    Special Requests BOTTLES DRAWN AEROBIC AND ANAEROBIC [facility]   Final    Culture  Setup Time [id number]   Final    Culture     Final    Value:        BLOOD CULTURE RECEIVED NO GROWTH TO DATE CULTURE WILL BE HELD FOR 5 DAYS BEFORE ISSUING A FINAL NEGATIVE REPORT   Report Status 02/13/2012 FINAL   Final   CULTURE, BLOOD (ROUTINE X 2)     Status: Normal (Preliminary result)   Collection Time   02/12/12  6:10 PM      Component Value Range Status Comment   Specimen Description BLOOD LEFT ARM   Final    Special Requests BOTTLES DRAWN AEROBIC ONLY   Final    Culture  Setup Time [id number]   Final    Culture     Final    Value:        BLOOD CULTURE RECEIVED NO GROWTH TO DATE CULTURE WILL BE HELD FOR 5 DAYS BEFORE ISSUING A FINAL NEGATIVE REPORT   Report Status PENDING   Incomplete   CULTURE, BLOOD (ROUTINE X 2)     Status: Normal   Collection Time   02/12/12  6:15 PM      Component Value Range Status Comment   Specimen Description BLOOD LEFT HAND   Final    Special Requests BOTTLES DRAWN AEROBIC AND ANAEROBIC   Final    Culture  Setup Time [id number]   Final    Culture     Final    Value: STAPHYLOCOCCUS SPECIES (COAGULASE NEGATIVE)     Note: THE SIGNIFICANCE OF ISOLATING THIS ORGANISM FROM A SINGLE SET OF BLOOD CULTURES WHEN MULTIPLE SETS ARE DRAWN IS UNCERTAIN. PLEASE NOTIFY THE MICROBIOLOGY DEPARTMENT WITHIN ONE WEEK IF SPECIATION AND SENSITIVITIES ARE REQUIRED.     Note: Gram Stain Report Called to,Read Back By and Verified With: DAWN BROWN @ 0057 ON 02/14/12 BY GOLLD   Report Status 02/15/2012 FINAL   Final   URINE CULTURE     Status: Normal   Collection Time   02/12/12  6:17 PM      Component Value Range Status Comment   Specimen Description URINE, CATHETERIZED   Final    Special Requests  NONE   Final    Culture  Setup Time [id number]   Final    Colony Count >=100,000 COLONIES/ML   Final    Culture     Final    Value: Multiple bacterial morphotypes present, none predominant. Suggest appropriate recollection if clinically indicated.   Report Status 02/13/2012 FINAL   Final     Studies/Results: Dg Chest 1 View  02/08/2012  *RADIOLOGY REPORT*  Clinical Data: Recheck for pneumonia.  No chest pain or cough.  CHEST - 1 VIEW  Comparison: 02/07/2012  Findings: The patient has right-sided PICC line, tip to the level of the superior vena cava.  There continues to be patchy density within the right lung and medial left lung base with little change from prior study.  Prior left humerus ORIF.  IMPRESSION: Little interval change.  Original Report Authenticated By: Patterson Hammersmith, M.D.   Dg Abd 1 View  02/13/2012  *RADIOLOGY REPORT*  Clinical Data: Abdominal tenderness.  ABDOMEN - 1 VIEW  Comparison: 01/12/2012  Findings: Band-like opacity noted in the left lower lobe with retro diaphragmatic opacities bilaterally.  I cannot exclude pneumonia.  Levoconvex rotary scoliosis the lumbar spine may be positional.  Degenerative arthropathy of the hips noted.  Calcified structure in the left pelvis may represent a calcified fibroid or vascular calcification.  Calcified ovarian lesion is considered less likely.  Scattered gas is present in the colon.  No dilated small bowel is observed.  The upper margin of a retrograde intramedullary rod of the left femur is noted.  IMPRESSION:  1.  Bilateral lower lobe airspace opacities, nonspecific. 2.  Lumbar spondylosis and scoliosis. 3.  Degenerative arthropathy of the hips bilaterally. 4.  Calcified structure in the left pelvis is probably a calcified uterine fibroid.  Vascular calcification or calcified ovarian lesion are considered statistically less likely.  Original Report Authenticated By: Dellia Cloud, M.D.   Ct Chest W Contrast  02/13/2012   *RADIOLOGY REPORT*  Clinical Data: Pleuritic chest pain.  Cough.  CT CHEST WITH CONTRAST  Technique:  Multidetector CT imaging of the chest was performed following the standard protocol during bolus administration of intravenous contrast.  Contrast: 80mL OMNIPAQUE IOHEXOL 300 MG/ML IV SOLN  Comparison: Portable chest obtained yesterday.  Findings: Small right pleural effusion.  Posterior atelectasis in both lower lobes, greater on the right.  There is also patchy opacity in the right perihilar region and more superiorly in the right upper lobe.  There is a small amount of patchy opacity in the right middle lobe, medially.  A small amount of patchy opacity is also noted in the right lower lobe.  No masses or enlarged lymph nodes are seen.  The upper abdomen is unremarkable.  Mild thoracic spine degenerative changes.  IMPRESSION:  1.  Multifocal right lung pneumonia, most pronounced in the perihilar region. 2.  Small right pleural effusion. 3.  Bilateral lower lobe atelectasis, greater on the right.  Original Report Authenticated By: Darrol Angel, M.D.   Dg Chest Port 1 View  02/12/2012  *RADIOLOGY REPORT*  Clinical Data: Fever  PORTABLE CHEST - 1 VIEW  Comparison: 02/08/2012  Findings: Decreased lung volumes with patchy right hilar and bibasilar airspace disease.  This could represent atelectasis versus pneumonia.  No effusion or pneumothorax.  No definite edema. Stable heart size.  Right PICC line remains.  IMPRESSION: Right perihilar and basilar streaky atelectasis/airspace process. Decreased lung volumes.  Original Report Authenticated By: Judie Petit. Ruel Favors, M.D.   Dg Chest Port 1 View  02/07/2012  *RADIOLOGY REPORT*  Clinical Data: Shortness of breath  PORTABLE CHEST - 1 VIEW  Comparison: 02/06/2012  Findings: Shallow inspiration.  Cardiac enlargement with pulmonary vascular congestion and perihilar infiltrates.  Changes are similar to previous study.  No pneumothorax.  Right PICC catheter is unchanged in  position.  IMPRESSION: Shallow inspiration with stable appearance of cardiac enlargement, pulmonary vascular congestion, and perihilar infiltration.  Original Report Authenticated By: Marlon Pel, M.D.   Dg Chest Port 1 View  02/06/2012  *RADIOLOGY REPORT*  Clinical Data: Increasing shortness of breath.  Decreasing oxygen saturation.  PORTABLE CHEST - 1 VIEW  Comparison: 02/06/2012 at 0416 hours  Findings: Shallow inspiration.  Borderline heart size and pulmonary vascularity.  Bilateral perihilar air space interstitial disease, greater on the right.  Changes could represent pneumonia or edema. Suggestion of blunting of the left costophrenic angle.  No pneumothorax.  Findings appear stable since the previous study. Interval placement of a right PICC catheter with tip over the low SVC region.  IMPRESSION: Shallow inspiration with borderline heart size and pulmonary vascularity.  Perihilar air space interstitial disease could represent pneumonia or edema.  PICC line tip is over the low SVC region.  Original Report Authenticated By: Marlon Pel, M.D.   Dg Chest Port 1 View  02/06/2012  *RADIOLOGY REPORT*  Clinical Data: Atelectasis.  PORTABLE CHEST - 1 VIEW  Comparison: 02/05/2012.  Findings: Trachea is midline, given patient rotation.  Heart size is grossly stable.  There is diffuse bilateral air space disease, somewhat asymmetric on the right.  Left hemidiaphragm is elevated.  IMPRESSION: Diffuse bilateral air space disease, somewhat asymmetric on the right, possibly due to edema.  Pneumonia is not excluded.  Original Report Authenticated By: Reyes Ivan,  M.D.   Dg Chest Port 1 View  02/05/2012  *RADIOLOGY REPORT*  Clinical Data: Atelectasis.  PORTABLE CHEST - 1 VIEW  Comparison: 02/04/2012.  Findings: Low volume chest.  Basilar atelectasis.  Perihilar and basilar predominant airspace disease is present.  Cardiopericardial silhouette and mediastinal contours are unchanged.  Patient is  rotated to the right. Monitoring leads are projected over the chest.  IMPRESSION: No interval change.  Original Report Authenticated By: Andreas Newport, M.D.   Dg Chest Port 1 View  02/04/2012  *RADIOLOGY REPORT*  Clinical Data: Follow-up effusions.  PORTABLE CHEST - 1 VIEW  Comparison: 02/03/2012  Findings: Cardiac size is within normal limits.  The patient is rotated towards the left.  Shallow inflation.  Patchy bilateral infiltrates appears stable.  IMPRESSION: Persistent bilateral infiltrates.  Original Report Authenticated By: Patterson Hammersmith, M.D.   Portable Chest Xray In Am  02/03/2012  *RADIOLOGY REPORT*  Clinical Data: Pneumonia.  Edema.  PORTABLE CHEST - 1 VIEW  Comparison: 02/02/2012  Findings: Shallow inspiration.  Mild cardiac enlargement with some pulmonary vascular congestion.  Perihilar infiltrates suggesting edema or possibly pneumonia.  Blunting of the left costophrenic angle likely represents a small effusion.  No pneumothorax.  No significant change since previous study.  IMPRESSION: Shallow inspiration.  Cardiac enlargement with pulmonary vascular congestion and probable perihilar edema.  Small left pleural effusion.  Original Report Authenticated By: Marlon Pel, M.D.   Dg Chest Portable 1 View  02/02/2012  *RADIOLOGY REPORT*  Clinical Data: Sepsis and unresponsive.  PORTABLE CHEST - 1 VIEW  Comparison: 01/12/2012  Findings: Lung volumes are very low with significant atelectasis present.  It would be difficult to exclude subtle infiltrate given the compressed lungs.  There is no overt pulmonary edema.  There may be a tiny amount of left pleural fluid.  The heart size is within normal limits.  IMPRESSION: Extremely low lung volumes with bilateral atelectasis.  Potential tiny left pleural effusion.  Original Report Authenticated By: Reola Calkins, M.D.   Medications: Scheduled Meds:   . amitriptyline  100 mg Oral QHS  . dalfampridine  10 mg Oral BID  . docusate sodium   200 mg Oral BID  . enoxaparin  40 mg Subcutaneous Q24H  . feeding supplement  1 Container Oral BID BM  . feeding supplement  30 mL Oral BID  . ferrous sulfate  325 mg Oral BID PC  . FLUoxetine  40 mg Oral Daily  . gabapentin  600 mg Oral TID  . levofloxacin (LEVAQUIN) IV  750 mg Intravenous Q24H  . pantoprazole  40 mg Oral Q1200  . piperacillin-tazobactam (ZOSYN)  IV  3.375 g Intravenous Q8H  . sodium chloride  3 mL Intravenous Q12H  . vancomycin  750 mg Intravenous Q12H  . vitamin A & D       Continuous Infusions:  PRN Meds:.HYDROcodone-acetaminophen, ondansetron (ZOFRAN) IV, ondansetron  Assessment/Plan:  LOS: 4 days  1. Acute encephalopathy: Resolved.Likely secondary to acute infection. 2. UTI: Final urine culture with multiple bacterial morphotypes present, none predominant. Of note the patient has a chronic indwelling Foley catheter making interpretation of urinalysis difficult. Repeat cultures ordered.  3. Pneumonia: will repeat cxr in am. Broad-spectrum antibiotics. Wean oxygen. 4. Recurrent bacteremia: Gram-positive cocci in clusters in one out of 2 bottles, growing coag staph , most likely contaminent.  5. Coag-negative staph bacteremia: Found on blood cultures from February 9 the day of patient's previous admission. By report the patient presented from home at that  time. Central line was placed on February 9 by did not have any other noted lines. Etiology of the bacteremia would be unclear at this point. Repeat cultures February 13th were negative. Patient has no symptoms referrable to previous joint surgeries. No murmurs heard on examination. 2d echo showed no vegetations.  Likely contaminant.  6. Abdominal pain: Does not appear to be significant based on examination and laboratory testing. Elevated alkaline phosphatase: First noted October 2010. Followup as outpatient. 7. Generalized weakness: Physical therapy consultation. 8. Anemia: Chronic normocytic. Stable. 9. History  multiple sclerosis: Stable. 10. Worsening lower extremity neuropathy: increased the gabapentin 11. Disposition: awaIting bed at [facility] 02/16/2012, 4:05 PM

## 2012-02-17 ENCOUNTER — Inpatient Hospital Stay (HOSPITAL_COMMUNITY): Payer: Medicare (Managed Care)

## 2012-02-17 LAB — CBC
HCT: 28.9 % — ABNORMAL LOW (ref 36.0–46.0)
Hemoglobin: 9 g/dL — ABNORMAL LOW (ref 12.0–15.0)
MCH: 24.4 pg — ABNORMAL LOW (ref 26.0–34.0)
MCHC: 31.1 g/dL (ref 30.0–36.0)
MCV: 78.3 fL (ref 78.0–100.0)
RBC: 3.69 MIL/uL — ABNORMAL LOW (ref 3.87–5.11)

## 2012-02-17 LAB — URINE CULTURE
Colony Count: 50000
Culture  Setup Time: 201302230118

## 2012-02-17 MED ORDER — BISACODYL 5 MG PO TBEC
10.0000 mg | DELAYED_RELEASE_TABLET | Freq: Every day | ORAL | Status: DC | PRN
  Administered 2012-02-18 – 2012-02-19 (×2): 10 mg via ORAL
  Filled 2012-02-17 (×2): qty 2

## 2012-02-17 MED ORDER — POLYETHYLENE GLYCOL 3350 17 G PO PACK
17.0000 g | PACK | Freq: Once | ORAL | Status: AC
  Administered 2012-02-17: 17 g via ORAL
  Filled 2012-02-17: qty 1

## 2012-02-17 NOTE — Progress Notes (Signed)
Subjective: Feels exhausted after the trip downstairs to radiology for the cxr.   Objective: Weight change:   Intake/Output Summary (Last 24 hours) at 02/17/12 1324 Last data filed at 02/17/12 0511  Gross per 24 hour  Intake    290 ml  Output   1700 ml  Net  -1410 ml    Filed Vitals:   02/17/12 0510  BP: 109/68  Pulse: 102  Temp: 97 F (36.1 C)  Resp: 18  On exam she is alert afebrile comfortable  [employer] s1s2 heard  Lungs clear  abd soft TENDER in the left upper quadrant.  bowel sounds heard  extremties no pedal edema.     Lab Results: Results for orders placed during the hospital encounter of 02/12/12 (from the past 24 hour(s))  CBC     Status: Abnormal   Collection Time   02/17/12  4:44 AM      Component Value Range   WBC 7.1  4.0 - 10.5 (K/uL)   RBC 3.69 (*) 3.87 - 5.11 (MIL/uL)   Hemoglobin 9.0 (*) 12.0 - 15.0 (g/dL)   HCT 16.1 (*) 09.6 - 46.0 (%)   MCV 78.3  78.0 - 100.0 (fL)   MCH 24.4 (*) 26.0 - 34.0 (pg)   MCHC 31.1  30.0 - 36.0 (g/dL)   RDW 04.5 (*) 40.9 - 15.5 (%)   Platelets 472 (*) 150 - 400 (K/uL)    Micro Results: Recent Results (from the past 240 hour(s))  CULTURE, BLOOD (ROUTINE X 2)     Status: Normal (Preliminary result)   Collection Time   02/12/12  6:10 PM      Component Value Range Status Comment   Specimen Description BLOOD LEFT ARM   Final    Special Requests BOTTLES DRAWN AEROBIC ONLY   Final    Culture  Setup Time [id number]   Final    Culture     Final    Value:        BLOOD CULTURE RECEIVED NO GROWTH TO DATE CULTURE WILL BE HELD FOR 5 DAYS BEFORE ISSUING A FINAL NEGATIVE REPORT   Report Status PENDING   Incomplete   CULTURE, BLOOD (ROUTINE X 2)     Status: Normal   Collection Time   02/12/12  6:15 PM      Component Value Range Status Comment   Specimen Description BLOOD LEFT HAND   Final    Special Requests BOTTLES DRAWN AEROBIC AND ANAEROBIC   Final    Culture  Setup Time [id number]   Final    Culture     Final    Value: STAPHYLOCOCCUS SPECIES (COAGULASE NEGATIVE)     Note: THE SIGNIFICANCE OF ISOLATING THIS ORGANISM FROM A SINGLE SET OF BLOOD CULTURES WHEN MULTIPLE SETS ARE DRAWN IS UNCERTAIN. PLEASE NOTIFY THE MICROBIOLOGY DEPARTMENT WITHIN ONE WEEK IF SPECIATION AND SENSITIVITIES ARE REQUIRED.     Note: Gram Stain Report Called to,Read Back By and Verified With: DAWN BROWN @ 0057 ON 02/14/12 BY GOLLD   Report Status 02/15/2012 FINAL   Final   URINE CULTURE     Status: Normal   Collection Time   02/12/12  6:17 PM      Component Value Range Status Comment   Specimen Description URINE, CATHETERIZED   Final    Special Requests NONE   Final    Culture  Setup Time [id number]   Final    Colony Count >=100,000 COLONIES/ML   Final    Culture  Final    Value: Multiple bacterial morphotypes present, none predominant. Suggest appropriate recollection if clinically indicated.   Report Status 02/13/2012 FINAL   Final   URINE CULTURE     Status: Normal   Collection Time   02/15/12  5:31 PM      Component Value Range Status Comment   Specimen Description URINE, CATHETERIZED   Final    Special Requests NONE   Final    Culture  Setup Time [id number]   Final    Colony Count 50,000 COLONIES/ML   Final    Culture YEAST   Final    Report Status 02/17/2012 FINAL   Final     Studies/Results: Dg Chest 1 View  02/08/2012  *RADIOLOGY REPORT*  Clinical Data: Recheck for pneumonia.  No chest pain or cough.  CHEST - 1 VIEW  Comparison: 02/07/2012  Findings: The patient has right-sided PICC line, tip to the level of the superior vena cava.  There continues to be patchy density within the right lung and medial left lung base with little change from prior study.  Prior left humerus ORIF.  IMPRESSION: Little interval change.  Original Report Authenticated By: Patterson Hammersmith, M.D.   Dg Abd 1 View  02/13/2012  *RADIOLOGY REPORT*  Clinical Data: Abdominal tenderness.  ABDOMEN - 1 VIEW  Comparison: 01/12/2012  Findings:  Band-like opacity noted in the left lower lobe with retro diaphragmatic opacities bilaterally.  I cannot exclude pneumonia.  Levoconvex rotary scoliosis the lumbar spine may be positional.  Degenerative arthropathy of the hips noted.  Calcified structure in the left pelvis may represent a calcified fibroid or vascular calcification.  Calcified ovarian lesion is considered less likely.  Scattered gas is present in the colon.  No dilated small bowel is observed.  The upper margin of a retrograde intramedullary rod of the left femur is noted.  IMPRESSION:  1.  Bilateral lower lobe airspace opacities, nonspecific. 2.  Lumbar spondylosis and scoliosis. 3.  Degenerative arthropathy of the hips bilaterally. 4.  Calcified structure in the left pelvis is probably a calcified uterine fibroid.  Vascular calcification or calcified ovarian lesion are considered statistically less likely.  Original Report Authenticated By: Dellia Cloud, M.D.   Ct Chest W Contrast  02/13/2012  *RADIOLOGY REPORT*  Clinical Data: Pleuritic chest pain.  Cough.  CT CHEST WITH CONTRAST  Technique:  Multidetector CT imaging of the chest was performed following the standard protocol during bolus administration of intravenous contrast.  Contrast: 80mL OMNIPAQUE IOHEXOL 300 MG/ML IV SOLN  Comparison: Portable chest obtained yesterday.  Findings: Small right pleural effusion.  Posterior atelectasis in both lower lobes, greater on the right.  There is also patchy opacity in the right perihilar region and more superiorly in the right upper lobe.  There is a small amount of patchy opacity in the right middle lobe, medially.  A small amount of patchy opacity is also noted in the right lower lobe.  No masses or enlarged lymph nodes are seen.  The upper abdomen is unremarkable.  Mild thoracic spine degenerative changes.  IMPRESSION:  1.  Multifocal right lung pneumonia, most pronounced in the perihilar region. 2.  Small right pleural effusion. 3.   Bilateral lower lobe atelectasis, greater on the right.  Original Report Authenticated By: Darrol Angel, M.D.   Dg Chest Port 1 View  02/12/2012  *RADIOLOGY REPORT*  Clinical Data: Fever  PORTABLE CHEST - 1 VIEW  Comparison: 02/08/2012  Findings: Decreased lung volumes with patchy  right hilar and bibasilar airspace disease.  This could represent atelectasis versus pneumonia.  No effusion or pneumothorax.  No definite edema. Stable heart size.  Right PICC line remains.  IMPRESSION: Right perihilar and basilar streaky atelectasis/airspace process. Decreased lung volumes.  Original Report Authenticated By: Judie Petit. Ruel Favors, M.D.   Dg Chest Port 1 View  02/07/2012  *RADIOLOGY REPORT*  Clinical Data: Shortness of breath  PORTABLE CHEST - 1 VIEW  Comparison: 02/06/2012  Findings: Shallow inspiration.  Cardiac enlargement with pulmonary vascular congestion and perihilar infiltrates.  Changes are similar to previous study.  No pneumothorax.  Right PICC catheter is unchanged in position.  IMPRESSION: Shallow inspiration with stable appearance of cardiac enlargement, pulmonary vascular congestion, and perihilar infiltration.  Original Report Authenticated By: Marlon Pel, M.D.   Dg Chest Port 1 View  02/06/2012  *RADIOLOGY REPORT*  Clinical Data: Increasing shortness of breath.  Decreasing oxygen saturation.  PORTABLE CHEST - 1 VIEW  Comparison: 02/06/2012 at 0416 hours  Findings: Shallow inspiration.  Borderline heart size and pulmonary vascularity.  Bilateral perihilar air space interstitial disease, greater on the right.  Changes could represent pneumonia or edema. Suggestion of blunting of the left costophrenic angle.  No pneumothorax.  Findings appear stable since the previous study. Interval placement of a right PICC catheter with tip over the low SVC region.  IMPRESSION: Shallow inspiration with borderline heart size and pulmonary vascularity.  Perihilar air space interstitial disease could represent  pneumonia or edema.  PICC line tip is over the low SVC region.  Original Report Authenticated By: Marlon Pel, M.D.   Dg Chest Port 1 View  02/06/2012  *RADIOLOGY REPORT*  Clinical Data: Atelectasis.  PORTABLE CHEST - 1 VIEW  Comparison: 02/05/2012.  Findings: Trachea is midline, given patient rotation.  Heart size is grossly stable.  There is diffuse bilateral air space disease, somewhat asymmetric on the right.  Left hemidiaphragm is elevated.  IMPRESSION: Diffuse bilateral air space disease, somewhat asymmetric on the right, possibly due to edema.  Pneumonia is not excluded.  Original Report Authenticated By: Reyes Ivan, M.D.   Dg Chest Port 1 View  02/05/2012  *RADIOLOGY REPORT*  Clinical Data: Atelectasis.  PORTABLE CHEST - 1 VIEW  Comparison: 02/04/2012.  Findings: Low volume chest.  Basilar atelectasis.  Perihilar and basilar predominant airspace disease is present.  Cardiopericardial silhouette and mediastinal contours are unchanged.  Patient is rotated to the right. Monitoring leads are projected over the chest.  IMPRESSION: No interval change.  Original Report Authenticated By: Andreas Newport, M.D.   Dg Chest Port 1 View  02/04/2012  *RADIOLOGY REPORT*  Clinical Data: Follow-up effusions.  PORTABLE CHEST - 1 VIEW  Comparison: 02/03/2012  Findings: Cardiac size is within normal limits.  The patient is rotated towards the left.  Shallow inflation.  Patchy bilateral infiltrates appears stable.  IMPRESSION: Persistent bilateral infiltrates.  Original Report Authenticated By: Patterson Hammersmith, M.D.   Portable Chest Xray In Am  02/03/2012  *RADIOLOGY REPORT*  Clinical Data: Pneumonia.  Edema.  PORTABLE CHEST - 1 VIEW  Comparison: 02/02/2012  Findings: Shallow inspiration.  Mild cardiac enlargement with some pulmonary vascular congestion.  Perihilar infiltrates suggesting edema or possibly pneumonia.  Blunting of the left costophrenic angle likely represents a small effusion.  No  pneumothorax.  No significant change since previous study.  IMPRESSION: Shallow inspiration.  Cardiac enlargement with pulmonary vascular congestion and probable perihilar edema.  Small left pleural effusion.  Original Report Authenticated By: Chrissie Noa  R. Andria Meuse, M.D.   Dg Chest Portable 1 View  02/02/2012  *RADIOLOGY REPORT*  Clinical Data: Sepsis and unresponsive.  PORTABLE CHEST - 1 VIEW  Comparison: 01/12/2012  Findings: Lung volumes are very low with significant atelectasis present.  It would be difficult to exclude subtle infiltrate given the compressed lungs.  There is no overt pulmonary edema.  There may be a tiny amount of left pleural fluid.  The heart size is within normal limits.  IMPRESSION: Extremely low lung volumes with bilateral atelectasis.  Potential tiny left pleural effusion.  Original Report Authenticated By: Reola Calkins, M.D.   Medications: Scheduled Meds:   . amitriptyline  100 mg Oral QHS  . dalfampridine  10 mg Oral BID  . docusate sodium  200 mg Oral BID  . enoxaparin  40 mg Subcutaneous Q24H  . feeding supplement  1 Container Oral BID BM  . feeding supplement  30 mL Oral BID  . ferrous sulfate  325 mg Oral BID PC  . FLUoxetine  40 mg Oral Daily  . gabapentin  600 mg Oral TID  . levofloxacin (LEVAQUIN) IV  750 mg Intravenous Q24H  . pantoprazole  40 mg Oral Q1200  . piperacillin-tazobactam (ZOSYN)  IV  3.375 g Intravenous Q8H  . sodium chloride  3 mL Intravenous Q12H  . vancomycin  750 mg Intravenous Q12H   Continuous Infusions:  PRN Meds:.HYDROcodone-acetaminophen, ondansetron (ZOFRAN) IV, ondansetron  Assessment/Plan: 1. Patient Active Hospital Problem List: Acute encephalopathy: Resolved.Likely secondary to acute infection. 2. UTI: Final urine culture with multiple bacterial morphotypes present, none predominant. Of note the patient has a chronic indwelling Foley catheter making interpretation of urinalysis difficult. Repeat cultures ordered.   3. Pneumonia: cxr results pending Broad-spectrum antibiotics. Wean oxygen. 4. Recurrent bacteremia: Gram-positive cocci in clusters in one out of 2 bottles, growing coag staph , most likely contaminent.  5. Coag-negative staph bacteremia: Found on blood cultures from February 9 the day of patient's previous admission. By report the patient presented from home at that time. Central line was placed on February 9 by did not have any other noted lines. Etiology of the bacteremia would be unclear at this point. Repeat cultures February 13th were negative. Patient has no symptoms referrable to previous joint surgeries. No murmurs heard on examination. 2d echo showed no vegetations.  Likely contaminant.  6. Abdominal pain: Does not appear to be significant based on examination and laboratory testing. Elevated alkaline phosphatase: First noted October 2010. Followup as outpatient. 7. Generalized weakness: Physical therapy consultation. 8. Anemia: Chronic normocytic. Stable. 9. History multiple sclerosis: Stable. 10. Worsening lower extremity neuropathy: increased the gabapentin 11. Disposition: awa[facility]ting bed at SNF 12. Constipation; stool softners.    LOS: 5 days   [name] [name] 02/17/2012, 1:24 PM

## 2012-02-17 NOTE — Progress Notes (Signed)
ANTIBIOTIC CONSULT NOTE -Follow UP  Pharmacy Consult for Vancomycin/Zosyn Indication: PNA, UTI  No Known Allergies  Patient Measurements: Height: 5' 4.96" (165 cm) Weight: 187 lb 9.8 oz (85.1 kg) IBW/kg (Calculated) : 56.91   Vital Signs: Temp: 97 F (36.1 C) (02/24 0510) Temp src: Oral (02/24 0510) BP: 109/68 mmHg (02/24 0510) Pulse Rate: 102  (02/24 0510) Intake/Output from previous day: 02/23 0701 - 02/24 0700 In: 850 [P.O.:600; IV Piggyback:250] Out: 2800 [Urine:2800] Intake/Output from this shift:    Labs:  Basename 02/17/12 0444 02/16/12 0526 02/15/12 0425  WBC 7.1 8.3 6.9  HGB 9.0* 8.9* 9.0*  PLT 472* 559* 520*  LABCREA -- -- --  CREATININE -- -- 0.61   Estimated Creatinine Clearance: 70.4 ml/min (by C-G formula based on Cr of 0.61). CrCl  39ml/min/1.73m2  Microbiology: Recent Results (from the past 720 hour(s))  CULTURE, BLOOD (ROUTINE X 2)     Status: Normal   Collection Time   02/02/12 11:55 AM      Component Value Range Status Comment   Specimen Description BLOOD LEFT WRIST  5 ML IN [facility] BOTTLE   Final    Special Requests NONE   Final    Culture  Setup Time [id number]   Final    Culture     Final    Value: STAPHYLOCOCCUS SPECIES (COAGULASE NEGATIVE)     Note: SUSCEPTIBILITIES PERFORMED ON PREVIOUS CULTURE WITHIN THE LAST 5 DAYS.     Note: Gram Stain Report Called to,Read Back By and Verified With: RN A. ARNOLD ON 02/03/12 AT 1528 BY TEDAR   Report Status 02/05/2012 FINAL   Final   CULTURE, BLOOD (ROUTINE X 2)     Status: Normal   Collection Time   02/02/12 12:40 PM      Component Value Range Status Comment   Specimen Description BLOOD LEFT FEMORAL  5 ML IN [facility] BOTTLE   Final    Special Requests NONE   Final    Culture  Setup Time [id number]   Final    Culture     Final    Value: STAPHYLOCOCCUS SPECIES (COAGULASE NEGATIVE)     Note: RIFAMPIN AND GENTAMICIN SHOULD NOT BE USED AS SINGLE DRUGS FOR TREATMENT OF STAPH INFECTIONS. This organism DOES NOT  demonstrate inducible Clindamycin resistance in vitro.     Note: Gram Stain Report Called to,Read Back By and Verified With: RN A. ARNOLD ON 02/03/12 AT 1528 BY TEDAR   Report Status 02/05/2012 FINAL   Final    Organism ID, Bacteria STAPHYLOCOCCUS SPECIES (COAGULASE NEGATIVE)   Final   URINE CULTURE     Status: Normal   Collection Time   02/02/12  1:06 PM      Component Value Range Status Comment   Specimen Description URINE, CLEAN CATCH   Final    Special Requests NONE   Final    Culture  Setup Time [id number]   Final    Colony Count >=100,000 COLONIES/ML   Final    Culture     Final    Value: PROVIDENCIA STUARTII     PROTEUS MIRABILIS   Report Status 02/05/2012 FINAL   Final    Organism ID, Bacteria PROVIDENCIA STUARTII   Final    Organism ID, Bacteria PROTEUS MIRABILIS   Final   MRSA PCR SCREENING     Status: Normal   Collection Time   02/02/12  4:00 PM      Component Value Range Status Comment   MRSA by PCR  NEGATIVE  NEGATIVE  Final   CULTURE, BLOOD (ROUTINE X 2)     Status: Normal   Collection Time   02/06/12  8:35 PM      Component Value Range Status Comment   Specimen Description BLOOD LEFT ARM   Final    Special Requests BOTTLES DRAWN AEROBIC AND ANAEROBIC 4CC   Final    Culture  Setup Time [id number]   Final    Culture     Final    Value:        BLOOD CULTURE RECEIVED NO GROWTH TO DATE CULTURE WILL BE HELD FOR 5 DAYS BEFORE ISSUING A FINAL NEGATIVE REPORT   Report Status 02/13/2012 FINAL   Final   CULTURE, BLOOD (ROUTINE X 2)     Status: Normal   Collection Time   02/06/12  8:45 PM      Component Value Range Status Comment   Specimen Description BLOOD LEFT ARM   Final    Special Requests BOTTLES DRAWN AEROBIC AND ANAEROBIC 2CC   Final    Culture  Setup Time [id number]   Final    Culture     Final    Value:        BLOOD CULTURE RECEIVED NO GROWTH TO DATE CULTURE WILL BE HELD FOR 5 DAYS BEFORE ISSUING A FINAL NEGATIVE REPORT   Report Status 02/13/2012 FINAL   Final     CULTURE, BLOOD (ROUTINE X 2)     Status: Normal (Preliminary result)   Collection Time   02/12/12  6:10 PM      Component Value Range Status Comment   Specimen Description BLOOD LEFT ARM   Final    Special Requests BOTTLES DRAWN AEROBIC ONLY   Final    Culture  Setup Time [id number]   Final    Culture     Final    Value:        BLOOD CULTURE RECEIVED NO GROWTH TO DATE CULTURE WILL BE HELD FOR 5 DAYS BEFORE ISSUING A FINAL NEGATIVE REPORT   Report Status PENDING   Incomplete   CULTURE, BLOOD (ROUTINE X 2)     Status: Normal   Collection Time   02/12/12  6:15 PM      Component Value Range Status Comment   Specimen Description BLOOD LEFT HAND   Final    Special Requests BOTTLES DRAWN AEROBIC AND ANAEROBIC   Final    Culture  Setup Time [id number]   Final    Culture     Final    Value: STAPHYLOCOCCUS SPECIES (COAGULASE NEGATIVE)     Note: THE SIGNIFICANCE OF ISOLATING THIS ORGANISM FROM A SINGLE SET OF BLOOD CULTURES WHEN MULTIPLE SETS ARE DRAWN IS UNCERTAIN. PLEASE NOTIFY THE MICROBIOLOGY DEPARTMENT WITHIN ONE WEEK IF SPECIATION AND SENSITIVITIES ARE REQUIRED.     Note: Gram Stain Report Called to,Read Back By and Verified With: DAWN BROWN @ 0057 ON 02/14/12 BY GOLLD   Report Status 02/15/2012 FINAL   Final   URINE CULTURE     Status: Normal   Collection Time   02/12/12  6:17 PM      Component Value Range Status Comment   Specimen Description URINE, CATHETERIZED   Final    Special Requests NONE   Final    Culture  Setup Time [id number]   Final    Colony Count >=100,000 COLONIES/ML   Final    Culture     Final    Value: Multiple bacterial morphotypes present,  none predominant. Suggest appropriate recollection if clinically indicated.   Report Status 02/13/2012 FINAL   Final   URINE CULTURE     Status: Normal   Collection Time   02/15/12  5:31 PM      Component Value Range Status Comment   Specimen Description URINE, CATHETERIZED   Final    Special Requests NONE   Final     Culture  Setup Time [id number]   Final    Colony Count 50,000 COLONIES/ML   Final    Culture YEAST   Final    Report Status 02/17/2012 FINAL   Final     Medical History: Past Medical History  Diagnosis Date  . Multiple sclerosis   . GERD (gastroesophageal reflux disease)   . Depression   . Recurrent UTI   . Recurrent pneumonia   . Chronic constipation   . Diverticulosis     Assessment:  70 YOF w/ UTI/PNA. Day #6 Vanc 750mg  IV q12h/Zosyn EI. D#5 Levaquin.   Initial Ucx w/ mult morphotypes, chronic foley making cx interpretation/abx selection difficult.  Repeat Ucx w/ yeast. Bcx x 2 shows 1/2 w/coag neg staph (?Contaminant)  Repeat CXR today pending  Vanc dose decreased 2/21 based on vanc trough  Scr wnl 2/22, CrCl 70  WBC down wnl  Afebrile  Goal of Therapy:  Vancomycin trough level 15-20 mcg/ml  Plan:   No change to current doses.   Duration of therapy per MD  Will consider rechecking trough if Vanc to cont  Gwen Her PharmD  [phone] 02/17/2012 1:34 PM

## 2012-02-18 ENCOUNTER — Inpatient Hospital Stay (HOSPITAL_COMMUNITY): Payer: Medicare (Managed Care)

## 2012-02-18 LAB — CBC
HCT: 29.9 % — ABNORMAL LOW (ref 36.0–46.0)
MCH: 24.1 pg — ABNORMAL LOW (ref 26.0–34.0)
MCV: 78.5 fL (ref 78.0–100.0)
Platelets: 493 10*3/uL — ABNORMAL HIGH (ref 150–400)
RDW: 20.2 % — ABNORMAL HIGH (ref 11.5–15.5)

## 2012-02-18 LAB — GLUCOSE, CAPILLARY: Glucose-Capillary: 94 mg/dL (ref 70–99)

## 2012-02-18 MED ORDER — ENSURE PUDDING PO PUDG: 1.0000 | Freq: Two times a day (BID) | ORAL | Status: DC

## 2012-02-18 MED ORDER — VANCOMYCIN HCL 500 MG IV SOLR
500.0000 mg | Freq: Two times a day (BID) | INTRAVENOUS | Status: DC
  Filled 2012-02-18 (×2): qty 500

## 2012-02-18 MED ORDER — FLEET ENEMA 7-19 GM/118ML RE ENEM
1.0000 | ENEMA | Freq: Once | RECTAL | Status: AC
  Administered 2012-02-18: 1 via RECTAL
  Filled 2012-02-18 (×2): qty 1

## 2012-02-18 MED ORDER — MOXIFLOXACIN HCL 400 MG PO TABS
400.0000 mg | ORAL_TABLET | Freq: Every day | ORAL | Status: DC
  Administered 2012-02-18: 400 mg via ORAL
  Filled 2012-02-18 (×2): qty 1

## 2012-02-18 MED ORDER — GABAPENTIN 300 MG PO CAPS: 600.0000 mg | ORAL_CAPSULE | Freq: Three times a day (TID) | ORAL | Status: DC

## 2012-02-18 MED ORDER — OXYCODONE HCL 5 MG PO TABS: 5.0000 mg | ORAL_TABLET | ORAL | Status: DC | PRN

## 2012-02-18 MED ORDER — BISACODYL 5 MG PO TBEC: 10.0000 mg | DELAYED_RELEASE_TABLET | Freq: Every day | ORAL | Status: AC | PRN

## 2012-02-18 MED ORDER — MOXIFLOXACIN HCL 400 MG PO TABS: 400.0000 mg | ORAL_TABLET | Freq: Every day | ORAL | Status: DC

## 2012-02-18 MED ORDER — POLYETHYLENE GLYCOL 3350 17 G PO PACK: 17.0000 g | PACK | Freq: Every day | ORAL | Status: DC

## 2012-02-18 MED ORDER — POLYETHYLENE GLYCOL 3350 17 G PO PACK
17.0000 g | PACK | Freq: Two times a day (BID) | ORAL | Status: DC
  Administered 2012-02-18 – 2012-02-19 (×2): 17 g via ORAL
  Filled 2012-02-18 (×3): qty 1

## 2012-02-18 NOTE — Discharge Summary (Addendum)
DISCHARGE SUMMARY  [name] [name]  MR#: [id number]  DOB:February 03, 1941  Date of Admission: 02/12/2012 Date of Discharge: 02/18/2012  Attending Physician:AKULA,VIJAYA  Patient's WUJ:WJXBJYN,WGNFAO [name] Pupa, MD, MD  Consults: -NOne  Discharge Diagnoses: Present on Admission:  . Health care associated pneumonia Weakness generalized .Leukocytosis .Anemia due to chronic illness Neuropathy Constipation     Medication List  As of 02/18/2012  1:39 PM   STOP taking these medications         acetaminophen 120 MG suppository      cefUROXime 500 MG tablet         TAKE these medications         amitriptyline 100 MG tablet   Commonly known as: ELAVIL   Take 100 mg by mouth at bedtime.      AMPYRA 10 MG Tb12   Generic drug: dalfampridine   Take 10 mg by mouth 2 (two) times daily.      bisacodyl 5 MG EC tablet   Commonly known as: DULCOLAX   Take 2 tablets (10 mg total) by mouth daily as needed.      docusate sodium 100 MG capsule   Commonly known as: COLACE   Take 200 mg by mouth 2 (two) times daily.      feeding supplement Liqd   Take 30 mLs by mouth 2 (two) times daily.      feeding supplement Pudg   Take 1 Container by mouth 2 (two) times daily between meals.      ferrous sulfate 325 (65 FE) MG tablet   Take 1 tablet (325 mg total) by mouth 2 (two) times daily with a meal.      FLUoxetine 40 MG capsule   Commonly known as: PROZAC   Take 40 mg by mouth daily.      gabapentin 300 MG capsule   Commonly known as: NEURONTIN   Take 2 capsules (600 mg total) by mouth 3 (three) times daily.      multivitamins ther. w/minerals Tabs   Take 1 tablet by mouth daily.      naproxen sodium 220 MG tablet   Commonly known as: ANAPROX   Take 220 mg by mouth 2 (two) times daily with a meal.      omeprazole 20 MG capsule   Commonly known as: PRILOSEC   Take 20 mg by mouth 2 (two) times daily.      oxyCODONE 5 MG immediate release tablet   Commonly known as: Oxy IR/ROXICODONE     Take 1 tablet (5 mg total) by mouth every 4 (four) hours as needed for pain. pain      vitamin C 500 MG tablet   Commonly known as: ASCORBIC ACID   Take 500 mg by mouth 2 (two) times daily.                       Avelox 400 mg po for 7 days.   Brief Admit Note:                   71 yo female presents to [facility] ED after just being discharged today to SNF for worsening confusion and generalized weakness. Pt reports having fevers and chills, but denies abdominal or urinary concerns. She was admitted to hospitalist service for evaluation of Health Care Associated Pneumonia.    Hospital Course:              Acute encephalopathy: Resolved.Likely secondary to  Health care associated pneumonia. She is  alert and oriented .  1. UTI: Final urine culture with multiple bacterial morphotypes present, none predominant. Of note the patient has a chronic indwelling Foley catheter making interpretation of urinalysis difficult. Repeat cultures negative.  2. Pneumonia:  Resolved, with antibiotics. She completed 7 days of IV antibiotics. She will be discharged on 7 more days of oral avelox to complete the course of the antibiotic. CXR shows resolution of the pneumonia, with bibasilar atelectasis. Recommend Incentive spirometry. No leukocytosis and afebrile and one set of blood cultures grew coag negative staph and the second blood cultures are negative.  3. Recurrent bacteremia: Gram-positive cocci in clusters in one out of 2 bottles, growing coag staph , most likely contaminent.  4. Coag-negative staph bacteremia: Found on blood cultures from February 9 the day of patient's previous admission. By report the patient presented from home at that time. Central line was placed on February 9 by did not have any other noted lines. Repeat cultures February 13th were negative. Patient has no symptoms referrable to previous joint surgeries. No murmurs heard on examination. 2d echo showed no vegetations., blood cultures likely  contaminant.  6. Abdominal pain: Does not appear to be significant based on examination and laboratory testing. Elevated alkaline phosphatase: First noted October 2010. Followup as outpatient. 7. Generalized weakness: Physical therapy consultation. 8. Anemia: Chronic normocytic. Stable. 9. History multiple sclerosis: Stable. 10. Worsening lower extremity neuropathy: increased the gabapentin and her pain has improved.  11. Constipation: stool softners are on board. She is given a fleet enema prior to discharge.  12. PT/OT recommended SNF rehab.   Day of Discharge BP 130/71  Pulse 104  Temp(Src) 97.2 F (36.2 C) (Oral)  Resp 18  Ht 5' 4.96" (1.65 m)  Wt 85.1 kg (187 lb 9.8 oz)  BMI 31.26 kg/m2  SpO2 92%  Physical Exam:On exam she is alert afebrile comfortable  CVS s1s2 heard  Lungs clear  AbdOMEN: soft non tender . bowel sounds heard  extremties no pedal edema.    Results for orders placed during the hospital encounter of 02/12/12 (from the past 24 hour(s))  CBC     Status: Abnormal   Collection Time   02/18/12  4:21 AM      Component Value Range   WBC 9.0  4.0 - 10.5 (K/uL)   RBC 3.81 (*) 3.87 - 5.11 (MIL/uL)   Hemoglobin 9.2 (*) 12.0 - 15.0 (g/dL)   HCT 16.1 (*) 09.6 - 46.0 (%)   MCV 78.5  78.0 - 100.0 (fL)   MCH 24.1 (*) 26.0 - 34.0 (pg)   MCHC 30.8  30.0 - 36.0 (g/dL)   RDW 04.5 (*) 40.9 - 15.5 (%)   Platelets 493 (*) 150 - 400 (K/uL)  GLUCOSE, CAPILLARY     Status: Normal   Collection Time   02/18/12  7:46 AM      Component Value Range   Glucose-Capillary 99  70 - 99 (mg/dL)  VANCOMYCIN, TROUGH     Status: Abnormal   Collection Time   02/18/12 11:34 AM      Component Value Range   Vancomycin Tr 25.9 (*) 10.0 - 20.0 (ug/mL)    Disposition: SNF REHAB.   Follow-up Appts: Discharge Orders    Future Orders Please Complete By Expires   Diet - low sodium heart healthy      Discharge instructions      Comments:   Follow up with pcp in 2 weeks .   Activity as  tolerated - No  restrictions         Follow-up Information    Follow up with Thane Edu, MD .           Time spent in discharge (includes decision making & examination of pt): 50 minutes  Signed: AKULA,VIJAYA 02/18/2012, 1:39 PM   Patient had a good BM today. Patient stable for discharge.

## 2012-02-18 NOTE — Progress Notes (Signed)
PT Cancellation Note  Treatment cancelled today due to patient receiving procedure or test.  RN reports patient to receive enema and planned d/c to SNF today.  [name],CYNDI 02/18/2012, 4:53 PM

## 2012-02-18 NOTE — Progress Notes (Signed)
ANTIBIOTIC CONSULT NOTE -Follow UP  Pharmacy Consult for Vancomycin/Zosyn Indication: PNA, UTI  No Known Allergies  Vital Signs: Temp: 97.2 F (36.2 C) (02/25 0554) Temp src: Oral (02/25 0554) BP: 130/71 mmHg (02/25 0554) Pulse Rate: 104  (02/25 0554)  Labs:  Basename 02/18/12 0421 02/17/12 0444 02/16/12 0526  WBC 9.0 7.1 8.3  HGB 9.2* 9.0* 8.9*  PLT 493* 472* 559*  LABCREA -- -- --  CREATININE -- -- --   Estimated Creatinine Clearance: 70.4 ml/min (by C-G formula based on Cr of 0.61).  Microbiology: 2/19 blood: CNS 1 of 2  2/19 urine: >100K multiple (with chronic foley in place) 2/22 Urine: Yeast 50K  Assessment:  70 YOF w/ UTI/PNA. Day #7 Vanc 750mg  IV q12h, Zosyn extended interval dosing, and day #6 Levaquin.   Vancomycin dose decreased 2/21 based on vanc trough. 2/25 Vanc trough 25.9 despite reducing dose on 2/21. Concerned that renal function may be declining but no SCr ordered for today - will order for AM.  WBC down wnl  Afebrile  Goal of Therapy:  Vancomycin trough level 15-20 mcg/ml  Plan:  Decrease vancomycin to 500mg  IV q12h. F/u SCr in AM.  Depending on SCr, may adjust further tomorrow.  Clance Boll, PharmD, BCPS Pager: [phone] 02/18/2012 1:16 PM

## 2012-02-18 NOTE — Consult Note (Signed)
WOC consult Note Reason for Consult: Moisture associated skin damage at apex of gluteal crease, right buttock (partial thickness), ulcer due to venous insufficiency at left lateral malleolus Wound type: Moisture associated skin damage, venous insufficiency ulceration; IAD (incontinence associated dermatitis) Pressure Ulcer POA: No Measurement: area at apex of gluteal crease, 2cm x .5cm x .2cm; right buttock, 2cm x .5cm x .2cm; erythema without denudation between buttocks and in perineum; left lateral malleolar ulcer measures 2cm x .5cm x .2cm. Wound bed:All partial thickness ulcerations are presenting with red, moist wound beds, no slough or necrotic tissue. Drainage (amount, consistency, odor) small, serosanguinous Periwound: erythematous, intact Dressing procedure/placement/frequency: Protective barrier cream to periarea, soflt silicone dressings to open areas at buttock crease, right buttock and left lateral malleolus.  Air overlay sleep surface and pressure redistribution boots. I will not follow, but will remain available to this patient and her medical staff.  Please re-consult if needed. Thanks, Ladona Mow, MSN, RN, [facility], CWOCN [phone])

## 2012-02-19 LAB — CULTURE, BLOOD (ROUTINE X 2)
Culture  Setup Time: 201302200145
Culture: NO GROWTH

## 2012-02-19 LAB — CBC
HCT: 30.9 % — ABNORMAL LOW (ref 36.0–46.0)
Hemoglobin: 9.7 g/dL — ABNORMAL LOW (ref 12.0–15.0)
MCH: 24.5 pg — ABNORMAL LOW (ref 26.0–34.0)
MCV: 78 fL (ref 78.0–100.0)
RBC: 3.96 MIL/uL (ref 3.87–5.11)

## 2012-02-19 LAB — GLUCOSE, CAPILLARY: Glucose-Capillary: 101 mg/dL — ABNORMAL HIGH (ref 70–99)

## 2012-02-19 MED ORDER — MOXIFLOXACIN HCL 400 MG PO TABS: 400.0000 mg | ORAL_TABLET | Freq: Every day | ORAL | Status: AC

## 2012-02-19 NOTE — Progress Notes (Signed)
Pt to return to [facility] today via [facility]. CSW left vm for pt's dtr re: d/c. No other CSW needs. CSW signing off. Dellie Burns, MSW, [place] [phone] (coverage)

## 2012-02-24 ENCOUNTER — Inpatient Hospital Stay (HOSPITAL_COMMUNITY)

## 2012-02-24 ENCOUNTER — Encounter (HOSPITAL_COMMUNITY): Payer: Self-pay | Admitting: Emergency Medicine

## 2012-02-24 ENCOUNTER — Other Ambulatory Visit: Payer: Self-pay

## 2012-02-24 ENCOUNTER — Inpatient Hospital Stay (HOSPITAL_COMMUNITY)
Admission: EM | Admit: 2012-02-24 | Discharge: 2012-03-08 | DRG: 193 | Disposition: A | Discharge status: Skilled Nursing Facility | Source: Ambulatory Visit | Attending: Internal Medicine | Admitting: Internal Medicine

## 2012-02-24 ENCOUNTER — Emergency Department (HOSPITAL_COMMUNITY)

## 2012-02-24 DIAGNOSIS — L89209 Pressure ulcer of unspecified hip, unspecified stage: Secondary | ICD-10-CM | POA: Diagnosis present

## 2012-02-24 DIAGNOSIS — J189 Pneumonia, unspecified organism: Principal | ICD-10-CM | POA: Diagnosis present

## 2012-02-24 DIAGNOSIS — D72829 Elevated white blood cell count, unspecified: Secondary | ICD-10-CM | POA: Diagnosis present

## 2012-02-24 DIAGNOSIS — Y846 Urinary catheterization as the cause of abnormal reaction of the patient, or of later complication, without mention of misadventure at the time of the procedure: Secondary | ICD-10-CM | POA: Diagnosis present

## 2012-02-24 DIAGNOSIS — N390 Urinary tract infection, site not specified: Secondary | ICD-10-CM | POA: Diagnosis present

## 2012-02-24 DIAGNOSIS — T83511A Infection and inflammatory reaction due to indwelling urethral catheter, initial encounter: Secondary | ICD-10-CM | POA: Diagnosis present

## 2012-02-24 DIAGNOSIS — L8993 Pressure ulcer of unspecified site, stage 3: Secondary | ICD-10-CM | POA: Diagnosis present

## 2012-02-24 DIAGNOSIS — N3289 Other specified disorders of bladder: Secondary | ICD-10-CM | POA: Diagnosis not present

## 2012-02-24 DIAGNOSIS — L89309 Pressure ulcer of unspecified buttock, unspecified stage: Secondary | ICD-10-CM | POA: Diagnosis present

## 2012-02-24 DIAGNOSIS — B3749 Other urogenital candidiasis: Secondary | ICD-10-CM | POA: Diagnosis present

## 2012-02-24 DIAGNOSIS — K5900 Constipation, unspecified: Secondary | ICD-10-CM | POA: Diagnosis present

## 2012-02-24 DIAGNOSIS — G35 Multiple sclerosis: Secondary | ICD-10-CM | POA: Diagnosis present

## 2012-02-24 DIAGNOSIS — G9341 Metabolic encephalopathy: Secondary | ICD-10-CM | POA: Diagnosis present

## 2012-02-24 DIAGNOSIS — D649 Anemia, unspecified: Secondary | ICD-10-CM | POA: Diagnosis present

## 2012-02-24 DIAGNOSIS — F29 Unspecified psychosis not due to a substance or known physiological condition: Secondary | ICD-10-CM | POA: Diagnosis present

## 2012-02-24 DIAGNOSIS — N289 Disorder of kidney and ureter, unspecified: Secondary | ICD-10-CM | POA: Diagnosis present

## 2012-02-24 DIAGNOSIS — Z66 Do not resuscitate: Secondary | ICD-10-CM | POA: Diagnosis present

## 2012-02-24 DIAGNOSIS — A419 Sepsis, unspecified organism: Secondary | ICD-10-CM

## 2012-02-24 LAB — POCT I-STAT 3, VENOUS BLOOD GAS (G3P V)
Acid-Base Excess: 1 mmol/L (ref 0.0–2.0)
Bicarbonate: 24.9 mEq/L — ABNORMAL HIGH (ref 20.0–24.0)
O2 Saturation: 81 %
pO2, Ven: 43 mmHg (ref 30.0–45.0)

## 2012-02-24 LAB — CBC
HCT: 30 % — ABNORMAL LOW (ref 36.0–46.0)
Hemoglobin: 11.4 g/dL — ABNORMAL LOW (ref 12.0–15.0)
MCH: 24.3 pg — ABNORMAL LOW (ref 26.0–34.0)
MCV: 77.5 fL — ABNORMAL LOW (ref 78.0–100.0)
Platelets: 290 10*3/uL (ref 150–400)
Platelets: 241 10*3/uL (ref 150–400)
RBC: 4.69 MIL/uL (ref 3.87–5.11)
RBC: 3.87 MIL/uL (ref 3.87–5.11)
WBC: 10.6 10*3/uL — ABNORMAL HIGH (ref 4.0–10.5)
WBC: 9.6 10*3/uL (ref 4.0–10.5)

## 2012-02-24 LAB — CREATININE, SERUM: GFR calc Af Amer: 63 mL/min — ABNORMAL LOW (ref >90)

## 2012-02-24 LAB — COMPREHENSIVE METABOLIC PANEL
ALT: 10 U/L (ref 0–35)
Alkaline Phosphatase: 182 U/L — ABNORMAL HIGH (ref 39–117)
BUN: 23 mg/dL (ref 6–23)
CO2: 22 mEq/L (ref 19–32)
Chloride: 101 mEq/L (ref 96–112)
GFR calc Af Amer: 64 mL/min — ABNORMAL LOW (ref >90)
GFR calc non Af Amer: 55 mL/min — ABNORMAL LOW (ref >90)
Glucose, Bld: 102 mg/dL — ABNORMAL HIGH (ref 70–99)
Potassium: 4.3 mEq/L (ref 3.5–5.1)
Sodium: 135 mEq/L (ref 135–145)
Total Bilirubin: 0.4 mg/dL (ref 0.3–1.2)
Total Protein: 7.3 g/dL (ref 6.0–8.3)

## 2012-02-24 LAB — URINALYSIS, ROUTINE W REFLEX MICROSCOPIC
Nitrite: NEGATIVE
Specific Gravity, Urine: 1.02 (ref 1.005–1.030)
Urobilinogen, UA: 0.2 mg/dL (ref 0.0–1.0)
pH: 6 (ref 5.0–8.0)

## 2012-02-24 LAB — URINE MICROSCOPIC-ADD ON

## 2012-02-24 LAB — DIFFERENTIAL
Lymphocytes Relative: 17 % (ref 12–46)
Lymphs Abs: 1.8 10*3/uL (ref 0.7–4.0)
Monocytes Relative: 11 % (ref 3–12)
Neutrophils Relative %: 72 % (ref 43–77)

## 2012-02-24 LAB — POCT I-STAT 3, ART BLOOD GAS (G3+)
Bicarbonate: 21.8 mEq/L (ref 20.0–24.0)
Patient temperature: 98.6
TCO2: 23 mmol/L (ref 0–100)
pH, Arterial: 7.364 (ref 7.350–7.400)

## 2012-02-24 LAB — LACTIC ACID, PLASMA: Lactic Acid, Venous: 1.1 mmol/L (ref 0.5–2.2)

## 2012-02-24 LAB — PROTIME-INR: Prothrombin Time: 15.9 seconds — ABNORMAL HIGH (ref 11.6–15.2)

## 2012-02-24 LAB — MRSA PCR SCREENING: MRSA by PCR: NEGATIVE

## 2012-02-24 MED ORDER — CIPROFLOXACIN IN D5W 400 MG/200ML IV SOLN
400.0000 mg | Freq: Two times a day (BID) | INTRAVENOUS | Status: DC
  Filled 2012-02-24: qty 200

## 2012-02-24 MED ORDER — LEVOFLOXACIN IN D5W 750 MG/150ML IV SOLN
750.0000 mg | INTRAVENOUS | Status: DC
  Filled 2012-02-24: qty 150

## 2012-02-24 MED ORDER — VANCOMYCIN HCL 1000 MG IV SOLR
1250.0000 mg | INTRAVENOUS | Status: DC
  Administered 2012-02-25: 1250 mg via INTRAVENOUS
  Filled 2012-02-24: qty 1250

## 2012-02-24 MED ORDER — DEXTROSE 5 % IV SOLN
2.0000 g | INTRAVENOUS | Status: DC
  Administered 2012-02-24: 2 g via INTRAVENOUS
  Filled 2012-02-24: qty 2

## 2012-02-24 MED ORDER — IOHEXOL 300 MG/ML  SOLN
80.0000 mL | Freq: Once | INTRAMUSCULAR | Status: AC | PRN
  Administered 2012-02-24: 80 mL via INTRAVENOUS

## 2012-02-24 MED ORDER — SODIUM CHLORIDE 0.9 % IV BOLUS (SEPSIS)
1000.0000 mL | Freq: Once | INTRAVENOUS | Status: AC
  Administered 2012-02-24: 1000 mL via INTRAVENOUS

## 2012-02-24 MED ORDER — PIPERACILLIN-TAZOBACTAM 3.375 G IVPB 30 MIN
3.3750 g | Freq: Once | INTRAVENOUS | Status: AC
  Administered 2012-02-24: 3.375 g via INTRAVENOUS
  Filled 2012-02-24: qty 50

## 2012-02-24 MED ORDER — LEVOFLOXACIN IN D5W 750 MG/150ML IV SOLN
750.0000 mg | INTRAVENOUS | Status: DC
  Administered 2012-02-24: 750 mg via INTRAVENOUS

## 2012-02-24 MED ORDER — DEXTROSE 5 % IV SOLN
1.0000 g | Freq: Three times a day (TID) | INTRAVENOUS | Status: DC
  Filled 2012-02-24 (×2): qty 1

## 2012-02-24 MED ORDER — AMITRIPTYLINE HCL 100 MG PO TABS
100.0000 mg | ORAL_TABLET | Freq: Every day | ORAL | Status: DC
  Administered 2012-02-25 – 2012-03-07 (×9): 100 mg via ORAL
  Filled 2012-02-24 (×14): qty 1

## 2012-02-24 MED ORDER — ADULT MULTIVITAMIN W/MINERALS CH
1.0000 | ORAL_TABLET | Freq: Every day | ORAL | Status: DC
  Administered 2012-02-26 – 2012-03-08 (×9): 1 via ORAL
  Filled 2012-02-24 (×14): qty 1

## 2012-02-24 MED ORDER — VANCOMYCIN HCL IN DEXTROSE 1-5 GM/200ML-% IV SOLN
1000.0000 mg | Freq: Once | INTRAVENOUS | Status: AC
  Administered 2012-02-24: 1000 mg via INTRAVENOUS
  Filled 2012-02-24: qty 200

## 2012-02-24 MED ORDER — FLUOXETINE HCL 20 MG PO CAPS
40.0000 mg | ORAL_CAPSULE | Freq: Every day | ORAL | Status: DC
  Administered 2012-02-26 – 2012-03-08 (×9): 40 mg via ORAL
  Filled 2012-02-24 (×14): qty 2

## 2012-02-24 MED ORDER — HEPARIN SODIUM (PORCINE) 5000 UNIT/ML IJ SOLN
5000.0000 [IU] | Freq: Three times a day (TID) | INTRAMUSCULAR | Status: DC
  Administered 2012-02-24 – 2012-03-04 (×27): 5000 [IU] via SUBCUTANEOUS
  Filled 2012-02-24 (×29): qty 1

## 2012-02-24 MED ORDER — ACETAMINOPHEN 650 MG RE SUPP
650.0000 mg | Freq: Once | RECTAL | Status: AC
  Administered 2012-02-24: 650 mg via RECTAL
  Filled 2012-02-24: qty 1

## 2012-02-24 MED ORDER — OXYCODONE HCL 5 MG PO TABS
5.0000 mg | ORAL_TABLET | ORAL | Status: DC | PRN
  Administered 2012-02-28 – 2012-03-02 (×4): 5 mg via ORAL
  Filled 2012-02-24 (×6): qty 1

## 2012-02-24 MED ORDER — SODIUM CHLORIDE 0.9 % IV SOLN
INTRAVENOUS | Status: DC
  Administered 2012-02-24: 500 mL via INTRAVENOUS
  Administered 2012-02-25 (×2): via INTRAVENOUS
  Administered 2012-02-26: 20 mL/h via INTRAVENOUS
  Administered 2012-02-26 – 2012-03-02 (×3): via INTRAVENOUS

## 2012-02-24 MED ORDER — ALBUTEROL SULFATE (5 MG/ML) 0.5% IN NEBU: 2.5000 mg | INHALATION_SOLUTION | RESPIRATORY_TRACT | Status: AC | PRN

## 2012-02-24 NOTE — ED Provider Notes (Signed)
History     CSN: [id number]  Arrival date & time 02/24/12  1205   First MD Initiated Contact with Patient 02/24/12 1207      Chief Complaint  Patient presents with  . Fever    (Consider location/radiation/quality/duration/timing/severity/associated sxs/prior treatment) HPI Patient is a 71 year old female who presents today by nursing facility for fever and all terminal status. Patient has complicated past medical history including multiple sclerosis, recurrent UTI, as well as recurrent pneumonia as. Patient was just discharged from this hospital on February 25 after an admission beginning on February 19 for pneumonia. She had been discharged to her nursing facility on Avelox. Patient apparently developed increasing worsening of terminal status as well as fever to 102. Here upon arrival patient is tachycardic, febrile to 103, and responsive to pain but only intermittently following commands. Her daughter reports that her baseline had been that she was alert and oriented. She began her decline in February after she was hospitalized with surgery for a left femur fracture. Since that time patient has been in and out of the hospital. Her daughter today reports that the patient would not want to be intubated or have CPR performed. There was not a DO NOT RESUSCITATE order on the chart. However, the patient's daughter is the health care power of attorney. Patient did arrive with Foley in place with foul-appearing urine and decreased urine output as well. There are no other associated or modifying factors. History is otherwise limited secondary to patient's altered metal status Past Medical History  Diagnosis Date  . Multiple sclerosis   . GERD (gastroesophageal reflux disease)   . Depression   . Recurrent UTI   . Recurrent pneumonia   . Chronic constipation   . Diverticulosis     Past Surgical History  Procedure Date  . Cholecystectomy   . Orif humeral shaft fracture 01/2011  . Video assisted  thoracoscopy 06/2010  . Closed reduction shoulder dislocation 04/2010    Fracture dislocation  . Femur im nail 01/13/2012    Procedure: INTRAMEDULLARY (IM) RETROGRADE FEMORAL NAILING;  Surgeon: Thera Flake., MD;  Location: [facility] OR;  Service: Orthopedics;  Laterality: Left;  . Femoral artery exploration 01/13/2012    Procedure: FEMORAL ARTERY EXPLORATION;  Surgeon: Pryor Ochoa, MD;  Location: [facility] OR;  Service: Vascular;  Laterality: Left;    History reviewed. No pertinent family history.  History  Substance Use Topics  . Smoking status: Never Smoker   . Smokeless tobacco: Never Used  . Alcohol Use: No    OB History    Grav Para Term Preterm Abortions TAB SAB Ect Mult Living                  Review of Systems  Unable to perform ROS: Mental status change    Allergies  Review of patient's allergies indicates no known allergies.  Home Medications   Current Outpatient Rx  Name Route Sig Dispense Refill  . AMITRIPTYLINE HCL 100 MG PO TABS Oral Take 100 mg by mouth at bedtime.    Marland Kitchen BISACODYL 5 MG PO TBEC Oral Take 2 tablets (10 mg total) by mouth daily as needed. 30 tablet 0  . DOCUSATE SODIUM 100 MG PO CAPS Oral Take 200 mg by mouth 2 (two) times daily.      Marland Kitchen ENSURE PUDDING PO PUDG Oral Take 1 Container by mouth 2 (two) times daily between meals. 150 Can 1  . PRO-STAT 64 PO LIQD Oral Take 30 mLs by mouth  2 (two) times daily.    Marland Kitchen FERROUS SULFATE 325 (65 FE) MG PO TABS Oral Take 1 tablet (325 mg total) by mouth 2 (two) times daily with a meal.    . FLUOXETINE HCL 40 MG PO CAPS Oral Take 40 mg by mouth daily.      Marland Kitchen GABAPENTIN 300 MG PO CAPS Oral Take 2 capsules (600 mg total) by mouth 3 (three) times daily. 90 capsule 1  . MOXIFLOXACIN HCL 400 MG PO TABS Oral Take 1 tablet (400 mg total) by mouth daily at 6 PM. 7 tablet 0  . THERA M PLUS PO TABS Oral Take 1 tablet by mouth daily.      Marland Kitchen NAPROXEN SODIUM 275 MG PO TABS Oral Take 275 mg by mouth 2 (two) times daily with a meal.      . OMEPRAZOLE 20 MG PO CPDR Oral Take 20 mg by mouth 2 (two) times daily.      . OXYCODONE HCL 5 MG PO TABS Oral Take 1 tablet (5 mg total) by mouth every 4 (four) hours as needed for pain. pain 12 tablet 0  . VITAMIN C 500 MG PO TABS Oral Take 500 mg by mouth 2 (two) times daily.        BP 106/50  Pulse 100  Temp(Src) 103 F (39.4 C) (Rectal)  Resp 22  Ht 5\' 8"  (1.727 m)  Wt 210 lb (95.255 kg)  BMI 31.93 kg/m2  SpO2 96%  Physical Exam  Nursing note and vitals reviewed. Constitutional: She appears lethargic. She appears toxic. She appears ill.  HENT:  Head: Normocephalic and atraumatic.  Eyes: Conjunctivae are normal. Pupils are equal, round, and reactive to light.  Neck: Normal range of motion.  Cardiovascular: Regular rhythm, S1 normal and S2 normal.  Tachycardia present.   Pulmonary/Chest: Tachypnea noted. She has decreased breath sounds.  Abdominal: Soft. Bowel sounds are normal. She exhibits no distension. There is no tenderness. There is no rebound and no guarding.  Musculoskeletal: She exhibits no edema.       Patient with heel boots in place patient is not moving her extremities but there are no obvious deformities noted.  Neurological: She appears lethargic.       Patient has decreased mental status compared to normal per her daughter. Patient intermittently opens her eyes to command, is persistently responsive to painful stimuli, intermittently speaks intelligible words. Exam is otherwise limited secondary to condition. There no obvious facial droop or other focal neurologic signs noted.  Skin: Skin is warm and dry.       Patient went to stage II sacral decubitus ulcers and no concerning findings for possible infection, left heel ulcer with DuoDERM in place there    ED Course  Procedures (including critical care time)  CRITICAL CARE Performed by: Cyndra Numbers   Total critical care time: 45 min  Critical care time was exclusive of separately billable procedures and  treating other patients.  Critical care was necessary to treat or prevent imminent or life-threatening deterioration.  Critical care was time spent personally by me on the following activities: development of treatment plan with patient and/or surrogate as well as nursing, discussions with consultants, evaluation of patient's response to treatment, examination of patient, obtaining history from patient or surrogate, ordering and performing treatments and interventions, ordering and review of laboratory studies, ordering and review of radiographic studies, pulse oximetry and re-evaluation of patient's condition.   Date: 02/24/2012  Rate: 112  Rhythm: sinus tachycardia  QRS  Axis: indeterminate  Intervals: normal  ST/T Wave abnormalities: nonspecific ST/T changes  Conduction Disutrbances:none  Narrative Interpretation: Patient with increasingly negative QRS axis with new T-wave inversions noted in leads 1 and aVL.  Old EKG Reviewed: changes noted  I-STAT troponin result from initial blood draw was reviewed by myself and was 0.01 though it did not cross into EPIC Labs Reviewed  CBC - Abnormal; Notable for the following:    WBC 10.6 (*)    Hemoglobin 11.4 (*)    MCV 77.8 (*)    MCH 24.3 (*)    RDW 20.2 (*)    All other components within normal limits  DIFFERENTIAL - Abnormal; Notable for the following:    Monocytes Absolute 1.2 (*)    All other components within normal limits  COMPREHENSIVE METABOLIC PANEL - Abnormal; Notable for the following:    Glucose, Bld 102 (*)    Calcium 10.7 (*)    Albumin 2.6 (*)    Alkaline Phosphatase 182 (*)    GFR calc non Af Amer 55 (*)    GFR calc Af Amer 64 (*)    All other components within normal limits  PROTIME-INR - Abnormal; Notable for the following:    Prothrombin Time 15.9 (*)    All other components within normal limits  URINALYSIS, ROUTINE W REFLEX MICROSCOPIC - Abnormal; Notable for the following:    APPearance TURBID (*)    Hgb urine  dipstick LARGE (*)    Protein, ur 100 (*)    Leukocytes, UA LARGE (*)    All other components within normal limits  POCT I-STAT 3, BLOOD GAS (G3P V) - Abnormal; Notable for the following:    pH, Ven 7.438 (*)    pCO2, Ven 36.8 (*)    Bicarbonate 24.9 (*)    All other components within normal limits  GLUCOSE, CAPILLARY - Abnormal; Notable for the following:    Glucose-Capillary 108 (*)    All other components within normal limits  URINE MICROSCOPIC-ADD ON - Abnormal; Notable for the following:    Squamous Epithelial / LPF FEW (*)    Bacteria, UA MANY (*)    All other components within normal limits  POCT I-STAT 3, BLOOD GAS (G3+) - Abnormal; Notable for the following:    pO2, Arterial 77.0 (*)    Acid-base deficit 3.0 (*)    All other components within normal limits  LIPASE, BLOOD  AMMONIA  LACTIC ACID, PLASMA  PROCALCITONIN  CULTURE, BLOOD (ROUTINE X 2)  CULTURE, BLOOD (ROUTINE X 2)  URINE CULTURE  BLOOD GAS, ARTERIAL   Ct Head Wo Contrast  02/24/2012  *RADIOLOGY REPORT*  Clinical Data: Fever.  Not responsive.  Being treated for pneumonia.  CT HEAD WITHOUT CONTRAST  Technique:  Contiguous axial images were obtained from the base of the skull through the vertex without contrast.  Comparison: None.  Findings: There is central and cortical atrophy. There is no evidence for hemorrhage, mass lesion, or acute infarction. Bilateral basal ganglia calcifications are present.  Bone windows show hyperostosis frontalis.  No evidence for acute fracture.  Visualized paranasal sinuses and mastoid air cells are unremarkable.  IMPRESSION:  1.  Atrophy. 2. No evidence for acute intracranial abnormality.  Original Report Authenticated By: Patterson Hammersmith, M.D.   Dg Chest Port 1 View  02/24/2012  *RADIOLOGY REPORT*  Clinical Data: Fever  PORTABLE CHEST - 1 VIEW  Comparison: 02/17/2012  Findings: Some worsening of left retrocardiac consolidation or atelectasis. Linear atelectasis or scarring in  the right  midlung stable.  Heart size normal.  No effusion.  IMPRESSION:  Worsening left retrocardiac atelectasis or pneumonia.  Original Report Authenticated By: Thora Lance III, M.D.     1. Sepsis   2. HCAP (healthcare-associated pneumonia)   3. UTI (urinary tract infection)       MDM  Patient was evaluated by myself and was immediately treated as a possible sepsis. She had extensive laboratory workup performed. Patient had EKG changes that were not ischemic. She was written for IV fluid boluses as well as vancomycin and Zosyn given her recent hospitalizations. Foley catheter was changed. Blood cultures and urine cultures were obtained prior to antibiotic administration. Patient did have chest x-ray that also showed a worsening retrocardiac pneumonia. Patient received Tylenol rectally for her temp of 103. Her vital signs improved with IV therapy. Neither lactate nor pro calcitonin were elevated. Patient had a leukocytosis of 10.6 with no bandemia. VBG was within normal limits. INR and troponin were not abnormal. Liver function and lipase also remained within normal limits. I discussed the patient with Dr. Craige Cotta who is on for pulmonary critical care. He agreed that patient could be admitted to hospitalist in step down unit given that her daughter reported she was DO NOT RESUSCITATE DO NOT INTUBATE. Patient also had a head CT with no acute changes. She was admitted to triad hospitalist for further management.        Cyndra Numbers, MD 02/24/12 [phone]

## 2012-02-24 NOTE — ED Notes (Signed)
Pt returned from CT at this time.  Dr. Alto Denver at pt bedside.

## 2012-02-24 NOTE — ED Notes (Signed)
Dr. Hunt at pt bedside. 

## 2012-02-24 NOTE — ED Notes (Signed)
Per [facility], pt from [facility].  Sent for fever of 102.  Recently admitted to [facility] for pneumonia.  Reports pt's mental status at baseline.

## 2012-02-24 NOTE — ED Notes (Signed)
Patient transported to CT 

## 2012-02-24 NOTE — Progress Notes (Signed)
ANTIBIOTIC CONSULT NOTE - INITIAL  Pharmacy Consult for Vancomycin/Cefepime/Levaquin Indication: HCAP  No Known Allergies  Patient Measurements: Height: 5\' 8"  (172.7 cm) Weight: 210 lb (95.255 kg) IBW/kg (Calculated) : 63.9   Vital Signs: Temp: 98.7 F (37.1 C) (03/03 1800) Temp src: Axillary (03/03 1800) BP: 98/52 mmHg (03/03 1800) Pulse Rate: 95  (03/03 1800) Intake/Output from previous day:   Intake/Output from this shift:    Labs:  Basename 02/24/12 1242  WBC 10.6*  HGB 11.4*  PLT 290  LABCREA --  CREATININE 1.01   Estimated Creatinine Clearance: 62.6 ml/min (by C-G formula based on Cr of 1.01). No results found for this basename: VANCOTROUGH:2,VANCOPEAK:2,VANCORANDOM:2,GENTTROUGH:2,GENTPEAK:2,GENTRANDOM:2,TOBRATROUGH:2,TOBRAPEAK:2,TOBRARND:2,AMIKACINPEAK:2,AMIKACINTROU:2,AMIKACIN:2, in the last 72 hours   Microbiology: Recent Results (from the past 720 hour(s))  CULTURE, BLOOD (ROUTINE X 2)     Status: Normal   Collection Time   02/02/12 11:55 AM      Component Value Range Status Comment   Specimen Description BLOOD LEFT WRIST  5 ML IN [facility] BOTTLE   Final    Special Requests NONE   Final    Culture  Setup Time [id number]   Final    Culture     Final    Value: STAPHYLOCOCCUS SPECIES (COAGULASE NEGATIVE)     Note: SUSCEPTIBILITIES PERFORMED ON PREVIOUS CULTURE WITHIN THE LAST 5 DAYS.     Note: Gram Stain Report Called to,Read Back By and Verified With: RN A. ARNOLD ON 02/03/12 AT 1528 BY TEDAR   Report Status 02/05/2012 FINAL   Final   CULTURE, BLOOD (ROUTINE X 2)     Status: Normal   Collection Time   02/02/12 12:40 PM      Component Value Range Status Comment   Specimen Description BLOOD LEFT FEMORAL  5 ML IN [facility] BOTTLE   Final    Special Requests NONE   Final    Culture  Setup Time [id number]   Final    Culture     Final    Value: STAPHYLOCOCCUS SPECIES (COAGULASE NEGATIVE)     Note: RIFAMPIN AND GENTAMICIN SHOULD NOT BE USED AS SINGLE DRUGS FOR  TREATMENT OF STAPH INFECTIONS. This organism DOES NOT demonstrate inducible Clindamycin resistance in vitro.     Note: Gram Stain Report Called to,Read Back By and Verified With: RN A. ARNOLD ON 02/03/12 AT 1528 BY TEDAR   Report Status 02/05/2012 FINAL   Final    Organism ID, Bacteria STAPHYLOCOCCUS SPECIES (COAGULASE NEGATIVE)   Final   URINE CULTURE     Status: Normal   Collection Time   02/02/12  1:06 PM      Component Value Range Status Comment   Specimen Description URINE, CLEAN CATCH   Final    Special Requests NONE   Final    Culture  Setup Time [id number]   Final    Colony Count >=100,000 COLONIES/ML   Final    Culture     Final    Value: PROVIDENCIA STUARTII     PROTEUS MIRABILIS   Report Status 02/05/2012 FINAL   Final    Organism ID, Bacteria PROVIDENCIA STUARTII   Final    Organism ID, Bacteria PROTEUS MIRABILIS   Final   MRSA PCR SCREENING     Status: Normal   Collection Time   02/02/12  4:00 PM      Component Value Range Status Comment   MRSA by PCR NEGATIVE  NEGATIVE  Final   CULTURE, BLOOD (ROUTINE X 2)     Status: Normal  Collection Time   02/06/12  8:35 PM      Component Value Range Status Comment   Specimen Description BLOOD LEFT ARM   Final    Special Requests BOTTLES DRAWN AEROBIC AND ANAEROBIC 4CC   Final    Culture  Setup Time [id number]   Final    Culture     Final    Value:        BLOOD CULTURE RECEIVED NO GROWTH TO DATE CULTURE WILL BE HELD FOR 5 DAYS BEFORE ISSUING A FINAL NEGATIVE REPORT   Report Status 02/13/2012 FINAL   Final   CULTURE, BLOOD (ROUTINE X 2)     Status: Normal   Collection Time   02/06/12  8:45 PM      Component Value Range Status Comment   Specimen Description BLOOD LEFT ARM   Final    Special Requests BOTTLES DRAWN AEROBIC AND ANAEROBIC 2CC   Final    Culture  Setup Time [id number]   Final    Culture     Final    Value:        BLOOD CULTURE RECEIVED NO GROWTH TO DATE CULTURE WILL BE HELD FOR 5 DAYS BEFORE ISSUING A FINAL  NEGATIVE REPORT   Report Status 02/13/2012 FINAL   Final   CULTURE, BLOOD (ROUTINE X 2)     Status: Normal   Collection Time   02/12/12  6:10 PM      Component Value Range Status Comment   Specimen Description BLOOD LEFT ARM   Final    Special Requests BOTTLES DRAWN AEROBIC ONLY   Final    Culture  Setup Time [id number]   Final    Culture NO GROWTH 5 DAYS   Final    Report Status 02/19/2012 FINAL   Final   CULTURE, BLOOD (ROUTINE X 2)     Status: Normal   Collection Time   02/12/12  6:15 PM      Component Value Range Status Comment   Specimen Description BLOOD LEFT HAND   Final    Special Requests BOTTLES DRAWN AEROBIC AND ANAEROBIC   Final    Culture  Setup Time [id number]   Final    Culture     Final    Value: STAPHYLOCOCCUS SPECIES (COAGULASE NEGATIVE)     Note: THE SIGNIFICANCE OF ISOLATING THIS ORGANISM FROM A SINGLE SET OF BLOOD CULTURES WHEN MULTIPLE SETS ARE DRAWN IS UNCERTAIN. PLEASE NOTIFY THE MICROBIOLOGY DEPARTMENT WITHIN ONE WEEK IF SPECIATION AND SENSITIVITIES ARE REQUIRED.     Note: Gram Stain Report Called to,Read Back By and Verified With: DAWN BROWN @ 0057 ON 02/14/12 BY GOLLD   Report Status 02/15/2012 FINAL   Final   URINE CULTURE     Status: Normal   Collection Time   02/12/12  6:17 PM      Component Value Range Status Comment   Specimen Description URINE, CATHETERIZED   Final    Special Requests NONE   Final    Culture  Setup Time [id number]   Final    Colony Count >=100,000 COLONIES/ML   Final    Culture     Final    Value: Multiple bacterial morphotypes present, none predominant. Suggest appropriate recollection if clinically indicated.   Report Status 02/13/2012 FINAL   Final   URINE CULTURE     Status: Normal   Collection Time   02/15/12  5:31 PM      Component Value Range Status Comment   Specimen  Description URINE, CATHETERIZED   Final    Special Requests NONE   Final    Culture  Setup Time [id number]   Final    Colony Count 50,000  COLONIES/ML   Final    Culture YEAST   Final    Report Status 02/17/2012 FINAL   Final     Medical History: Past Medical History  Diagnosis Date  . Multiple sclerosis   . GERD (gastroesophageal reflux disease)   . Depression   . Recurrent UTI   . Recurrent pneumonia   . Chronic constipation   . Diverticulosis     Medications:  Anti-infectives     Start     Dose/Rate Route Frequency Ordered Stop   02/24/12 2200   ceFEPIme (MAXIPIME) 1 g in dextrose 5 % 50 mL IVPB        1 g 100 mL/hr over 30 Minutes Intravenous 3 times per day 02/24/12 1928 03/03/12 2159   02/24/12 1930   Levofloxacin (LEVAQUIN) IVPB 750 mg        750 mg 100 mL/hr over 90 Minutes Intravenous Every 24 hours 02/24/12 1928 03/03/12 1929   02/24/12 1545   ciprofloxacin (CIPRO) IVPB 400 mg        400 mg 200 mL/hr over 60 Minutes Intravenous Every 12 hours 02/24/12 1544     02/24/12 1230   piperacillin-tazobactam (ZOSYN) IVPB 3.375 g        3.375 g 100 mL/hr over 30 Minutes Intravenous  Once 02/24/12 1225 02/24/12 1313   02/24/12 1230   vancomycin (VANCOCIN) IVPB 1000 mg/200 mL premix        1,000 mg 200 mL/hr over 60 Minutes Intravenous  Once 02/24/12 1225 02/24/12 1429         Assessment: HCAP, UTI: To begin empiric therapy with Vancomycin/Cefepime/Levaquin.  Cipro provides no additional coverage over Levaquin.  Her SCr of 1 is elevated for a patient who is debilitated, and I note she has a history of elevated Vancomycin troughs in February which indicates her SCr may not be reflective of her renal function.  Will empirically reduce dosages for a CrCl of 30-50 ml/min.  Goal of Therapy:  Vancomycin trough level 15-20 mcg/ml  Plan:  Cefepime 2gm IV q24h Levaquin 750mg  IV q48h D/C Cipro - duplicate therapy Vancomycin 1250mg  IV q24h - next dose 3/4. Monitor renal function and UOP closely. Await micro data.  [name] [name], Pharm.D., BCPS Clinical Pharmacist  Pager [phone] 02/24/2012, 7:53  PM

## 2012-02-24 NOTE — H&P (Signed)
Hospital Admission Note Date: 02/24/2012  PCP: Thane Edu, MD, MD  Chief Complaint:Confusion   History of Present Illness: This is a 71 year old female with past medical history of multiple sclerosis, with an indwelling catheter and multiple decubitus ulcer, recurrent UTIs and recurrent pneumonias recently discharged from the hospital on 02/18/2012 nursing home at this time she was treated with vancomycin and Zosyn IV for 7 days then switch to Avelox for 7 additional days that comes in for confusion. As per the daughter the patient started getting confused this morning to the point where she could not eat or communicate. She relates this started suddenly. The family has been informed by the nursing home that for the last 24 hours she has not been eating or drinking well.  Here in the ED a chest x-ray was done that is concerning for pneumonia on the left side. Which is the same side she was treated previously. An ABG was done that was venous with a PCO2 of 36 and PO2 of 43 blood pressure has been stable, patient is satting 94-93% on 2 L. She got vancomycin and Zosyn in the ED. Also her urine appears to be purulent as she has an indwelling catheter. So we were asked to admit and further evaluate  Allergies: Review of patient's allergies indicates no known allergies. Past Medical History  Diagnosis Date  . Multiple sclerosis   . GERD (gastroesophageal reflux disease)   . Depression   . Recurrent UTI   . Recurrent pneumonia   . Chronic constipation   . Diverticulosis    Prior to Admission medications   Medication Sig Start Date End Date Taking? Authorizing Provider  amitriptyline (ELAVIL) 100 MG tablet Take 100 mg by mouth at bedtime.   Yes Historical Provider, MD  bisacodyl (DULCOLAX) 5 MG EC tablet Take 2 tablets (10 mg total) by mouth daily as needed. 02/18/12 03/19/12 Yes Kathlen Mody, MD  docusate sodium (COLACE) 100 MG capsule Take 200 mg by mouth 2 (two) times daily.     Yes  Historical Provider, MD  feeding supplement (ENSURE) PUDG Take 1 Container by mouth 2 (two) times daily between meals. 02/18/12  Yes Kathlen Mody, MD  feeding supplement (PRO-STAT SUGAR FREE 64) LIQD Take 30 mLs by mouth 2 (two) times daily.   Yes Historical Provider, MD  ferrous sulfate 325 (65 FE) MG tablet Take 1 tablet (325 mg total) by mouth 2 (two) times daily with a meal. 02/12/12 02/11/13 Yes Christina Rama, MD  FLUoxetine (PROZAC) 40 MG capsule Take 40 mg by mouth daily.     Yes Historical Provider, MD  gabapentin (NEURONTIN) 300 MG capsule Take 2 capsules (600 mg total) by mouth 3 (three) times daily. 02/18/12 02/17/13 Yes Kathlen Mody, MD  moxifloxacin (AVELOX) 400 MG tablet Take 1 tablet (400 mg total) by mouth daily at 6 PM. 02/19/12 02/29/12 Yes Hartley Barefoot, MD  Multiple Vitamins-Minerals (MULTIVITAMINS THER. W/MINERALS) TABS Take 1 tablet by mouth daily.     Yes Historical Provider, MD  naproxen sodium (ANAPROX) 275 MG tablet Take 275 mg by mouth 2 (two) times daily with a meal.   Yes Historical Provider, MD  omeprazole (PRILOSEC) 20 MG capsule Take 20 mg by mouth 2 (two) times daily.     Yes Historical Provider, MD  oxyCODONE (OXY IR/ROXICODONE) 5 MG immediate release tablet Take 1 tablet (5 mg total) by mouth every 4 (four) hours as needed for pain. pain 02/18/12  Yes Kathlen Mody, MD  vitamin C (ASCORBIC ACID)  500 MG tablet Take 500 mg by mouth 2 (two) times daily.     Yes Historical Provider, MD   Past Surgical History  Procedure Date  . Cholecystectomy   . Orif humeral shaft fracture 01/2011  . Video assisted thoracoscopy 06/2010  . Closed reduction shoulder dislocation 04/2010    Fracture dislocation  . Femur im nail 01/13/2012    Procedure: INTRAMEDULLARY (IM) RETROGRADE FEMORAL NAILING;  Surgeon: Thera Flake., MD;  Location: [facility] OR;  Service: Orthopedics;  Laterality: Left;  . Femoral artery exploration 01/13/2012    Procedure: FEMORAL ARTERY EXPLORATION;  Surgeon: Pryor Ochoa, MD;  Location: [facility] OR;  Service: Vascular;  Laterality: Left;   History reviewed. No pertinent family history. History   Social History  . Marital Status: Unknown    Spouse Name: N/A    Number of Children: 2  . Years of Education: N/A   Occupational History  . seamstress     [employer]   Social History Main Topics  . Smoking status: Never Smoker   . Smokeless tobacco: Never Used  . Alcohol Use: No  . Drug Use: No  . Sexually Active: No   Other Topics Concern  . Not on file   Social History Narrative   Lives in [place] by herself. Has two children who live in [place].    REVIEW OF SYSTEMS:  Constitutional:  No weight loss, night sweats, Fevers, chills, fatigue.  HEENT:  No headaches, Difficulty swallowing,Tooth/dental problems,Sore throat,  No sneezing, itching, ear ache, nasal congestion, post nasal drip,  Cardio-vascular:  No chest pain, Orthopnea, PND, swelling in lower extremities, anasarca, dizziness, palpitations  GI:  No heartburn, indigestion, abdominal pain, nausea, vomiting, diarrhea, change in bowel habits, loss of appetite  Resp:  No shortness of breath with exertion or at rest. No excess mucus, no productive cough, No non-productive cough, No coughing up of blood.No change in color of mucus.No wheezing.No chest wall deformity  Skin:  no rash or lesions.  GU:  no dysuria, change in color of urine, no urgency or frequency. No flank pain.  Musculoskeletal:  No joint pain or swelling. No decreased range of motion. No back pain.  Psych:  No change in mood or affect. No depression or anxiety. No memory loss.   Physical Exam: Filed Vitals:   02/24/12 1208 02/24/12 1300 02/24/12 1417 02/24/12 1419  BP: 114/74 107/65 110/54   Pulse: 112 111 104 103  Temp: 103 F (39.4 C)     TempSrc: Rectal     Resp: 22 27  24   Height: 5\' 8"  (1.727 m)     Weight: 95.255 kg (210 lb)     SpO2: 92% 94% 94% 93%   No intake or output data in the 24 hours  ending 02/24/12 1600 BP 110/54  Pulse 103  Temp(Src) 103 F (39.4 C) (Rectal)  Resp 24  Ht 5\' 8"  (1.727 m)  Wt 95.255 kg (210 lb)  BMI 31.93 kg/m2  SpO2 93%  General Appearance:    lethargic   Head:    Normocephalic, without obvious abnormality, atraumatic  Eyes:    PERRL, conjunctiva/corneas clear, EOM's intact, fundi    benign, both eyes  Ears:    Normal TM's and external ear canals, both ears  Nose:   Nares normal, septum midline, mucosa normal, no drainage    or sinus tenderness  Throat:   poor oral hygiene   Neck:   Supple, symmetrical, trachea midline, no adenopathy;  thyroid:  no enlargement/tenderness/nodules; no carotid   bruit or JVD  Back:     Symmetric, no curvature, ROM normal, no CVA tenderness  Lungs:     Clear to auscultation bilaterally, respirations labored   Chest Wall:    No tenderness or deformity   Heart:    Regular rate and rhythm, S1 and S2 normal, no murmur, rub   or gallop     Abdomen:     Soft, non-tender, bowel sounds active all four quadrants,    no masses, no organomegaly        Extremities:   Extremities normal, atraumatic, no cyanosis or edema  Pulses:   2+ and symmetric all extremities  Skin:   Skin color, texture, turgor normal, no rashes.she does have a pressure ulcer on her left lateral side of the ankle. Her daughter relates she has multiple pressure ulcers in her on her sacrum.   Lymph nodes:   Cervical, supraclavicular, and axillary nodes normal  Neurologic:   not able to perform as patient has not able to communicate or follow commands. She withdrawal noci stimuli.    Lab results:  [facility] 02/24/12 1242  NA 135  K 4.3  CL 101  CO2 22  GLUCOSE 102*  BUN 23  CREATININE 1.01  CALCIUM 10.7*  MG --  PHOS --    Basename 02/24/12 1242  AST 23  ALT 10  ALKPHOS 182*  BILITOT 0.4  PROT 7.3  ALBUMIN 2.6*    Basename 02/24/12 1242  LIPASE 14  AMYLASE --    Basename 02/24/12 1242  WBC 10.6*  NEUTROABS 7.6  HGB 11.4*    HCT 36.5  MCV 77.8*  PLT 290   No results found for this basename: CKTOTAL:3,CKMB:3,CKMBINDEX:3,TROPONINI:3 in the last 72 hours No components found with this basename: POCBNP:3 No results found for this basename: DDIMER:2 in the last 72 hours No results found for this basename: HGBA1C:2 in the last 72 hours No results found for this basename: CHOL:2,HDL:2,LDLCALC:2,TRIG:2,CHOLHDL:2,LDLDIRECT:2 in the last 72 hours No results found for this basename: TSH,T4TOTAL,FREET3,T3FREE,THYROIDAB in the last 72 hours No results found for this basename: VITAMINB12:2,FOLATE:2,FERRITIN:2,TIBC:2,IRON:2,RETICCTPCT:2 in the last 72 hours Imaging results:  Dg Chest 1 View  02/08/2012  *RADIOLOGY REPORT*  Clinical Data: Recheck for pneumonia.  No chest pain or cough.  CHEST - 1 VIEW  Comparison: 02/07/2012  Findings: The patient has right-sided PICC line, tip to the level of the superior vena cava.  There continues to be patchy density within the right lung and medial left lung base with little change from prior study.  Prior left humerus ORIF.  IMPRESSION: Little interval change.  Original Report Authenticated By: Patterson Hammersmith, M.D.   Dg Chest 2 View  02/17/2012  *RADIOLOGY REPORT*  Clinical Data: Short of breath  CHEST - 2 VIEW  Comparison: 02/12/2012  Findings: Lungs are under aerated with bibasilar atelectasis. Normal heart size.  No pneumothorax.  No pleural effusion.  IMPRESSION: Bibasilar atelectasis.  Original Report Authenticated By: Donavan Burnet, M.D.   Dg Abd 1 View  02/18/2012  *RADIOLOGY REPORT*  Clinical Data: Constipation  ABDOMEN - 1 VIEW  Comparison: 02/13/2012  Findings:  Moderate colonic stool burden without definite evidence of obstruction. Evaluation pneumoperitoneum is limited secondary to supine patient positioning and exclusion of the lower thorax.  No definite pneumatosis.  Calcification overlying the left hemipelvis likely represents a calcified fibroid.  Left femoral  intramedullary rod, incompletely evaluated. Possible bone island within the intertrochanteric right femur.  Lumbar spine degenerative change.  IMPRESSION: Moderate colonic stool burden without evidence of obstruction.  Original Report Authenticated By: Waynard Reeds, M.D.   Dg Abd 1 View  02/13/2012  *RADIOLOGY REPORT*  Clinical Data: Abdominal tenderness.  ABDOMEN - 1 VIEW  Comparison: 01/12/2012  Findings: Band-like opacity noted in the left lower lobe with retro diaphragmatic opacities bilaterally.  I cannot exclude pneumonia.  Levoconvex rotary scoliosis the lumbar spine may be positional.  Degenerative arthropathy of the hips noted.  Calcified structure in the left pelvis may represent a calcified fibroid or vascular calcification.  Calcified ovarian lesion is considered less likely.  Scattered gas is present in the colon.  No dilated small bowel is observed.  The upper margin of a retrograde intramedullary rod of the left femur is noted.  IMPRESSION:  1.  Bilateral lower lobe airspace opacities, nonspecific. 2.  Lumbar spondylosis and scoliosis. 3.  Degenerative arthropathy of the hips bilaterally. 4.  Calcified structure in the left pelvis is probably a calcified uterine fibroid.  Vascular calcification or calcified ovarian lesion are considered statistically less likely.  Original Report Authenticated By: Dellia Cloud, M.D.   Ct Head Wo Contrast  02/24/2012  *RADIOLOGY REPORT*  Clinical Data: Fever.  Not responsive.  Being treated for pneumonia.  CT HEAD WITHOUT CONTRAST  Technique:  Contiguous axial images were obtained from the base of the skull through the vertex without contrast.  Comparison: None.  Findings: There is central and cortical atrophy. There is no evidence for hemorrhage, mass lesion, or acute infarction. Bilateral basal ganglia calcifications are present.  Bone windows show hyperostosis frontalis.  No evidence for acute fracture.  Visualized paranasal sinuses and mastoid air  cells are unremarkable.  IMPRESSION:  1.  Atrophy. 2. No evidence for acute intracranial abnormality.  Original Report Authenticated By: Patterson Hammersmith, M.D.   Ct Chest W Contrast  02/13/2012  *RADIOLOGY REPORT*  Clinical Data: Pleuritic chest pain.  Cough.  CT CHEST WITH CONTRAST  Technique:  Multidetector CT imaging of the chest was performed following the standard protocol during bolus administration of intravenous contrast.  Contrast: 80mL OMNIPAQUE IOHEXOL 300 MG/ML IV SOLN  Comparison: Portable chest obtained yesterday.  Findings: Small right pleural effusion.  Posterior atelectasis in both lower lobes, greater on the right.  There is also patchy opacity in the right perihilar region and more superiorly in the right upper lobe.  There is a small amount of patchy opacity in the right middle lobe, medially.  A small amount of patchy opacity is also noted in the right lower lobe.  No masses or enlarged lymph nodes are seen.  The upper abdomen is unremarkable.  Mild thoracic spine degenerative changes.  IMPRESSION:  1.  Multifocal right lung pneumonia, most pronounced in the perihilar region. 2.  Small right pleural effusion. 3.  Bilateral lower lobe atelectasis, greater on the right.  Original Report Authenticated By: Darrol Angel, M.D.   Dg Chest Port 1 View  02/24/2012  *RADIOLOGY REPORT*  Clinical Data: Fever  PORTABLE CHEST - 1 VIEW  Comparison: 02/17/2012  Findings: Some worsening of left retrocardiac consolidation or atelectasis. Linear atelectasis or scarring in the right midlung stable.  Heart size normal.  No effusion.  IMPRESSION:  Worsening left retrocardiac atelectasis or pneumonia.  Original Report Authenticated By: Osa Craver, M.D.   Other results: EKG: sinus tachycardia. She has a marked posterior axis with ST segment depression in 1 and aVL and half millimeter elevation  in lead 3, intervals are within normal   Patient Active Hospital Problem List:  1.PNEUMONIA  (07/12/2010) 71 year old female recently discharged from the hospital for health care associated pneumonia, treated in the hospital with 7 days of IV antibiotics and then changed 7 days of by mouth medication that comes in with confusion, I am quite concerned that these episodes of pneumonia can be aspiration related. But we'll still cover her with antibiotics for Health associated pneumonia. We'll get a sputum cultures and blood cultures. We'll also get a CT scan of her chest. As she does have an infiltrate in the same area that she had her pneumonia last time and it seems worst by chest x-ray. To rule out abscess. Continue IV fluids, basic met in the am.  2.Metabolic encephalopathy  This probably infectious etiology in nature, at this time we'll place the patient n.p.o. we'll hold her by mouth medications. And reevaluated the morning. We'll start empirical treatment for health care associated pneumonia and UTI. She also has hypercalcemia which can be contributing to this. I think her hypocalcemia is probably secondary to her decrease intravascular volume.  She has a normal EKG changes, we'll get a right-sided EKG and cycle her cardiac enzymes x3. This could be contracting to her encephalopathy.  3. Urinary tract infection Her Foley catheter has what seems pus, she has an indwelling catheter. I am concerned for her urinary tract infection due to the time she's had this indwelling catheter cefepime should cover. We'll also send for urine cultures.  4.PRESSURE ULCER HIP (09/15/2009) we'll get a wound care consult. Her left lateral ulcer on the malleolus seemed to have some drainage.  5.Multiple sclerosis (12/22/2010)  this is probably was causing her to aspirate. She's had multiple episodes of pneumonia in the past. At this time her wishes has been expressed and has been passed on to me by her daughter as she wishes to be DNR/DNI.  6.Normocytic anemia (02/06/2012)  stable followup as an  outpatient.  7.Leukocytosis (02/12/2012) This probably secondary to infectious etiology. Getting blood cultures and starting antibiotic Vancomycin cefepime Levaquin.  8.Hypercalcemia (02/24/2012)  this probably secondary to dehydration. Currently giving her a bolus of fluids. Will continue IV fluids and check a basic metabolic panel in the morning. At this time her kidney seems to be working appropriately.   Code Status: DNR/DNI Family CommunicationLars Mage Daughter [phone]   Marinda Elk M.D. [facility] [phone] 02/24/2012, 4:00 PM

## 2012-02-25 LAB — URINE CULTURE
Colony Count: NO GROWTH
Culture  Setup Time: 201303032143
Culture: NO GROWTH

## 2012-02-25 LAB — CARDIAC PANEL(CRET KIN+CKTOT+MB+TROPI)
CK, MB: 1 ng/mL (ref 0.3–4.0)
Relative Index: INVALID (ref 0.0–2.5)
Total CK: 14 U/L (ref 7–177)
Troponin I: <0.3 ng/mL (ref <0.30)

## 2012-02-25 LAB — CBC
Hemoglobin: 9.2 g/dL — ABNORMAL LOW (ref 12.0–15.0)
MCH: 24.7 pg — ABNORMAL LOW (ref 26.0–34.0)
MCHC: 31.6 g/dL (ref 30.0–36.0)
MCV: 78.2 fL (ref 78.0–100.0)

## 2012-02-25 LAB — COMPREHENSIVE METABOLIC PANEL
ALT: 8 U/L (ref 0–35)
AST: 21 U/L (ref 0–37)
CO2: 21 mEq/L (ref 19–32)
Calcium: 9.7 mg/dL (ref 8.4–10.5)
Chloride: 106 mEq/L (ref 96–112)
GFR calc non Af Amer: 62 mL/min — ABNORMAL LOW (ref >90)
Sodium: 136 mEq/L (ref 135–145)

## 2012-02-25 LAB — DIFFERENTIAL
Basophils Relative: 0 % (ref 0–1)
Eosinophils Absolute: 0.3 10*3/uL (ref 0.0–0.7)
Eosinophils Relative: 4 % (ref 0–5)
Monocytes Absolute: 0.8 10*3/uL (ref 0.1–1.0)
Monocytes Relative: 12 % (ref 3–12)
Neutrophils Relative %: 69 % (ref 43–77)

## 2012-02-25 MED ORDER — LEVOFLOXACIN IN D5W 750 MG/150ML IV SOLN
750.0000 mg | INTRAVENOUS | Status: DC
  Administered 2012-02-25 – 2012-02-28 (×4): 750 mg via INTRAVENOUS
  Filled 2012-02-25 (×5): qty 150

## 2012-02-25 NOTE — Progress Notes (Signed)
Utilization review completed. [name] [name] 02/25/2012 

## 2012-02-25 NOTE — Progress Notes (Addendum)
Subjective: -Patient more alert today. -relates she has been coughing more thanusaual altely.  Objective: Filed Vitals:   02/24/12 1800 02/24/12 2000 02/25/12 0000 02/25/12 0400  BP: 98/52 112/50 95/50   Pulse: 95 96 95   Temp: 98.7 F (37.1 C) 97.4 F (36.3 C) 98.5 F (36.9 C) 98 F (36.7 C)  TempSrc: Axillary Axillary Oral Oral  Resp: 20 21 20    Height:      Weight:  77.1 kg (169 lb 15.6 oz)    SpO2: 97% 97% 97%    Weight change:   Intake/Output Summary (Last 24 hours) at 02/25/12 0718 Last data filed at 02/25/12 0600  Gross per 24 hour  Intake   1375 ml  Output    550 ml  Net    825 ml    General: Alert, awake, oriented x3, in no acute distress.  HEENT: No bruits, no goiter.  Heart: Regular rate and rhythm, without murmurs, rubs, gallops.  Lungs: Crackles left side, bilateral air movement.  Abdomen: Soft, nontender, nondistended, positive bowel sounds.  Neuro: Grossly intact, nonfocal.   Lab Results:  Basename 02/25/12 0342 02/24/12 2000 02/24/12 1242  NA 136 -- 135  K 3.5 -- 4.3  CL 106 -- 101  CO2 21 -- 22  GLUCOSE 92 -- 102*  BUN 24* -- 23  CREATININE 0.92 1.02 --  CALCIUM 9.7 -- 10.7*  MG -- -- --  PHOS -- -- --    Basename 02/25/12 0342 02/24/12 1242  AST 21 23  ALT 8 10  ALKPHOS 141* 182*  BILITOT 0.3 0.4  PROT 6.0 7.3  ALBUMIN 2.2* 2.6*    Basename 02/24/12 1242  LIPASE 14  AMYLASE --    Basename 02/25/12 0342 02/24/12 2000 02/24/12 1242  WBC 6.8 9.6 --  NEUTROABS 4.7 -- 7.6  HGB 9.2* 9.5* --  HCT 29.1* 30.0* --  MCV 78.2 77.5* --  PLT 241 241 --    Basename 02/25/12 0343  CKTOTAL 14  CKMB 1.0  CKMBINDEX --  TROPONINI <0.30   No components found with this basename: POCBNP:3 No results found for this basename: DDIMER:2 in the last 72 hours No results found for this basename: HGBA1C:2 in the last 72 hours No results found for this basename: CHOL:2,HDL:2,LDLCALC:2,TRIG:2,CHOLHDL:2,LDLDIRECT:2 in the last 72 hours No results  found for this basename: TSH,T4TOTAL,FREET3,T3FREE,THYROIDAB in the last 72 hours No results found for this basename: VITAMINB12:2,FOLATE:2,FERRITIN:2,TIBC:2,IRON:2,RETICCTPCT:2 in the last 72 hours  Micro Results: Recent Results (from the past 240 hour(s))  URINE CULTURE     Status: Normal   Collection Time   02/15/12  5:31 PM      Component Value Range Status Comment   Specimen Description URINE, CATHETERIZED   Final    Special Requests NONE   Final    Culture  Setup Time [id number]   Final    Colony Count 50,000 COLONIES/ML   Final    Culture YEAST   Final    Report Status 02/17/2012 FINAL   Final   MRSA PCR SCREENING     Status: Normal   Collection Time   02/24/12  7:14 PM      Component Value Range Status Comment   MRSA by PCR NEGATIVE  NEGATIVE  Final     Studies/Results: Ct Head Wo Contrast  02/24/2012  *RADIOLOGY REPORT*  Clinical Data: Fever.  Not responsive.  Being treated for pneumonia.  CT HEAD WITHOUT CONTRAST  Technique:  Contiguous axial images were obtained from the base of  the skull through the vertex without contrast.  Comparison: None.  Findings: There is central and cortical atrophy. There is no evidence for hemorrhage, mass lesion, or acute infarction. Bilateral basal ganglia calcifications are present.  Bone windows show hyperostosis frontalis.  No evidence for acute fracture.  Visualized paranasal sinuses and mastoid air cells are unremarkable.  IMPRESSION:  1.  Atrophy. 2. No evidence for acute intracranial abnormality.  Original Report Authenticated By: Patterson Hammersmith, M.D.   Ct Chest W Contrast  02/24/2012  *RADIOLOGY REPORT*  Clinical Data: Chronic UTI with sepsis.  Evaluate for pneumonia or empyema.  CT CHEST WITH CONTRAST  Technique:  Multidetector CT imaging of the chest was performed following the standard protocol during bolus administration of intravenous contrast.  Contrast: 80mL OMNIPAQUE IOHEXOL 300 MG/ML IJ SOLN  Comparison: Chest radiographs  02/17/2012 and 02/24/2012.  Chest CT 02/13/2012.  Findings: Minimal thyroid heterogeneity appears stable.  There are no enlarged mediastinal or hilar lymph nodes.  Mild coronary artery calcifications are noted.  There is no significant residual pleural or pericardial effusion. The dependent opacities at both lung bases have improved likely reflecting resolving atelectasis.  There is also improving patchy airspace disease in the right upper lobe. No worsening air space disease or mass is identified.  The visualized upper abdomen has a stable appearance.  There is chronic slightly progressive elevation of the left hemidiaphragm. No acute osseous findings are seen.  Motion simulates a fracture of the mid sternum on the sagittal images.  IMPRESSION:  1.  Improving patchy bilateral air space opacities.  No worsening pneumonia identified. 2.  No significant residual pleural effusion.  No evidence of empyema. 3.  Progressive elevation of the left hemidiaphragm.  Original Report Authenticated By: Gerrianne Scale, M.D.   Dg Chest Port 1 View  02/24/2012  *RADIOLOGY REPORT*  Clinical Data: Fever  PORTABLE CHEST - 1 VIEW  Comparison: 02/17/2012  Findings: Some worsening of left retrocardiac consolidation or atelectasis. Linear atelectasis or scarring in the right midlung stable.  Heart size normal.  No effusion.  IMPRESSION:  Worsening left retrocardiac atelectasis or pneumonia.  Original Report Authenticated By: Osa Craver, M.D.    Medications: I have reviewed the patient's current medications.   Patient Active Hospital Problem List: 1.UTI (lower urinary tract infection) (02/12/2012)  Urine cultures pending. Afebrile WBC down. BP stable. Continue IV fluids and antibiotics. I will go ahead and d/c cefepime and vancomycin,  and continue levofloxacin.   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  Multiple sclerosis (12/22/2010) Stable. PT consult.  Normocytic anemia (02/06/2012)   Stable. Most likely anemia of chronic disease.  Leukocytosis (02/12/2012) Resolved.  Hypercalcemia (02/24/2012) Resolved. 2/2 dehydration.  Metabolic encephalopathy (02/24/2012) -Resolved. Secondarily to UTI. -cardiac marker negative.   LOS: 1 day   Marinda Elk M.D.  [facility] 02/25/2012, 7:18 AM

## 2012-02-25 NOTE — Progress Notes (Signed)
  Patient Details:  Graciella Arment December 11, 1941 [id number]  Chart opened in error  Roni Friberg,S Ibrahim Mcpheeters 02/25/2012, 5:28 PM

## 2012-02-25 NOTE — Progress Notes (Signed)
Pt report called to 5000, RN. Speech by to see pt. Family just left bedside aware of transfer and swallow assessment. Medicines currently held until speech assesses. VSS.

## 2012-02-25 NOTE — Progress Notes (Signed)
Speech Language/Pathology  Order clarified - received order to proceed with MBS secondary to concerns for aspiration as contributor to recurrent PNAs.  Radiology able to schedule for tomorrow, 3/5.   Pt currently NPO with meds being held - discussed with RN who will f/u with MD.  Marchelle Folks L. Samson Frederic, [place] CCC/SLP Pager [phone]

## 2012-02-25 NOTE — Evaluation (Signed)
Physical Therapy Evaluation Patient Details Name: [name] [name] MRN: [id number] DOB: 1941/10/30 Today's Date: 02/25/2012  Problem List:  Patient Active Problem List  Diagnoses  . HERPES ZOSTER W/NERVOUS COMPLICATION NEC  . CANDIDIASIS  . OBESITY  . DEPRESSION  . CAUSALGIA  . PERIPHERAL VASCULAR DISEASE  . URI  . PNEUMONIA  . BRONCHITIS NOS  . GASTROESOPHAGEAL REFLUX DISEASE  . RENAL FAILURE, ACUTE  . CYSTITIS  . PRESSURE ULCER HIP  . ULCER OF ANKLE  . LEG PAIN, LEFT  . OSTEOPENIA  . FATIGUE  . OTHER GENERAL SYMPTOMS  . SKIN RASH  . EDEMA LEG  . WEIGHT LOSS  . DYSURIA  . OTHER CLOSED FRACTURES OF UPPER END OF HUMERUS  . LIVER FUNCTION TESTS, ABNORMAL, HX OF  . CHOLECYSTECTOMY, HX OF  . POSTMENOPAUSAL STATUS  . Multiple sclerosis  . Femoral shaft fracture  . Hemorrhagic shock  . Infection due to urinary indwelling catheter  . Septic shock  . Hypokalemia  . Elevated LFTs  . Normocytic anemia  . Hypoglycemia  . ARF (acute renal failure)  . Hyperkalemia  . Hospital acquired PNA  . Thrush  . Left arm swelling  . Superficial venous thrombosis of arm  . Anorexia  . Obesity (BMI 30-39.9)  . Weakness generalized  . Leukocytosis  . Anemia due to chronic illness  . UTI (lower urinary tract infection)  . Hypercalcemia  . Metabolic encephalopathy    Past Medical History:  Past Medical History  Diagnosis Date  . Multiple sclerosis   . GERD (gastroesophageal reflux disease)   . Depression   . Recurrent UTI   . Recurrent pneumonia   . Chronic constipation   . Diverticulosis    Past Surgical History:  Past Surgical History  Procedure Date  . Cholecystectomy   . Orif humeral shaft fracture 01/2011  . Video assisted thoracoscopy 06/2010  . Closed reduction shoulder dislocation 04/2010    Fracture dislocation  . Femur im nail 01/13/2012    Procedure: INTRAMEDULLARY (IM) RETROGRADE FEMORAL NAILING;  Surgeon: Thera Flake., MD;  Location: [facility] OR;  Service:  Orthopedics;  Laterality: Left;  . Femoral artery exploration 01/13/2012    Procedure: FEMORAL ARTERY EXPLORATION;  Surgeon: Pryor Ochoa, MD;  [facility];  Service: Vascular;  Laterality: Left;    PT Assessment/Plan/Recommendation PT Assessment Clinical Impression Statement: Pt is 71 yo female with UTI and PNA whose mobility is significantly limited, is incontinent of stool, and has AMS.  Recommend d/c to SNF, pt to follow acutely. PT Recommendation/Assessment: Patient will need skilled PT in the acute care venue PT Problem List: Decreased strength;Decreased range of motion;Decreased activity tolerance;Decreased balance;Decreased mobility;Decreased coordination;Decreased cognition;Decreased knowledge of use of DME;Pain Barriers to Discharge: None PT Therapy Diagnosis : Generalized weakness;Acute pain PT Plan PT Frequency: Min 2X/week PT Treatment/Interventions: Functional mobility training;Therapeutic activities;Therapeutic exercise;Balance training;Neuromuscular re-education;Patient/family education PT Recommendation Follow Up Recommendations: Skilled nursing facility Equipment Recommended: Defer to next venue PT Goals  Acute Rehab PT Goals PT Goal Formulation: With patient Time For Goal Achievement: 2 weeks Pt will Roll Supine to Right Side: with mod assist PT Goal: Rolling Supine to Right Side - Progress: Goal set today Pt will Roll Supine to Left Side: with mod assist PT Goal: Rolling Supine to Left Side - Progress: Goal set today Pt will go Supine/Side to Sit: with max assist PT Goal: Supine/Side to Sit - Progress: Goal set today Pt will Sit at Edge of Bed: 6-10 min;with bilateral upper extremity  support;with min assist PT Goal: Sit at Edge Of Bed - Progress: Goal set today Pt will go Sit to Supine/Side: with max assist PT Goal: Sit to Supine/Side - Progress: Goal set today Pt will Transfer Bed to Chair/Chair to Bed: with +2 total assist PT Transfer Goal: Bed to  Chair/Chair to Bed - Progress: Goal set today  PT Evaluation Precautions/Restrictions  Precautions Precautions: Fall Required Braces or Orthoses: No Restrictions Weight Bearing Restrictions: No Prior Functioning  Home Living Lives With: Other (Comment) (SNF) Receives Help From: Personal care attendant Type of Home: Skilled Nursing Facility Home Layout: One level Home Adaptive Equipment: Wheelchair - manual;Walker - rolling Additional Comments: pt has been at SNF since last hospitalization and cannot remember the last time she ambulated. Prior Function Level of Independence: Needs assistance with tranfers;Needs assistance with ADLs Able to Take Stairs?: No Driving: No Vocation: Retired Producer, television/film/video: Awake/alert Overall Cognitive Status: Impaired Memory: Appears impaired Memory Deficits: pt unable to recall previous functional status with clarity and shows STM deficits Orientation Level: Oriented to person;Oriented to situation Cognition - Other Comments: pt tearful at times due to NPO status Sensation/Coordination Sensation Light Touch: Appears Intact Stereognosis: Not tested Hot/Cold: Not tested Proprioception: Impaired by gross assessment Coordination Gross Motor Movements are Fluid and Coordinated: No Fine Motor Movements are Fluid and Coordinated: Not tested Coordination and Movement Description: Pt with minimal active mvmt, very weak today Extremity Assessment RUE Assessment RUE Assessment: Exceptions to [facility] RUE AROM (degrees) RUE Overall AROM Comments: bilateral shoulders significantly limited in flexion, painful. Also with very weak grasp bilaterally. LUE Assessment LUE Assessment: Exceptions to [facility] LUE AROM (degrees) LUE Overall AROM Comments: same as RUE RLE Assessment RLE Assessment: Exceptions to [facility] RLE Strength RLE Overall Strength: Deficits;Due to premorbid status RLE Overall Strength Comments: hip flexion 1/5, knee flexion/  extension 2/5 LLE Assessment LLE Assessment: Exceptions to [facility] LLE Strength LLE Overall Strength: Deficits;Due to premorbid status LLE Overall Strength Comments: hip flexion 1/5, knee flexion/ extension 2-/5 Mobility (including Balance) Bed Mobility Bed Mobility: Yes Rolling Right: 2: Max assist;With rail Rolling Right Details (indicate cue type and reason): pt has difficulty grasping rail and cannot bend knees up.  Is able to hold self on side once positioned in SL Rolling Left: 2: Max assist;With rail Rolling Left Details (indicate cue type and reason): same as roll to right Right Sidelying to Sit: 1: +2 Total assist;Patient percentage (comment) (pt 15%) Right Sidelying to Sit Details (indicate cue type and reason): pt unable to wt-shift or push self away from bed with right UE Sit to Supine: 1: +2 Total assist;Patient percentage (comment) (pt 0%) Sit to Supine - Details (indicate cue type and reason): pt unable to assist with sit to supine Transfers Transfers: No Ambulation/Gait Ambulation/Gait: No Stairs: No Wheelchair Mobility Wheelchair Mobility: No  Posture/Postural Control Posture/Postural Control: Postural limitations Postural Limitations: mod A for upright sitting posture, left lean, decreased awareness of midline Balance Balance Assessed: Yes Static Sitting Balance Static Sitting - Balance Support: Bilateral upper extremity supported;Feet supported Static Sitting - Level of Assistance: 3: Mod assist Static Sitting - Comment/# of Minutes: 10 Exercise  General Exercises - Lower Extremity Long Arc Quad: AAROM;Both;20 reps;Seated End of Session PT - End of Session Activity Tolerance: Patient limited by fatigue;Patient limited by pain Patient left: in bed;with call bell in reach Nurse Communication: Mobility status for transfers General Behavior During Session: Other (comment) (emotional ) Cognition: Impaired  Varnell Orvis, Benetta Spar  [phone] 02/25/2012, 3:57 PM

## 2012-02-26 ENCOUNTER — Inpatient Hospital Stay (HOSPITAL_COMMUNITY)

## 2012-02-26 LAB — URINE CULTURE

## 2012-02-26 MED FILL — Starch Oral Thickening Powder: ORAL | Qty: 120 | Status: AC

## 2012-02-26 NOTE — Procedures (Signed)
Modified Barium Swallow Procedure Note Patient Details  Name: Abuk Selleck MRN: [id number] Date of Birth: 29-Nov-1941  Today's Date: 02/26/2012 Time:  -     Past Medical History:  Past Medical History  Diagnosis Date  . Multiple sclerosis   . GERD (gastroesophageal reflux disease)   . Depression   . Recurrent UTI   . Recurrent pneumonia   . Chronic constipation   . Diverticulosis    Past Surgical History:  Past Surgical History  Procedure Date  . Cholecystectomy   . Orif humeral shaft fracture 01/2011  . Video assisted thoracoscopy 06/2010  . Closed reduction shoulder dislocation 04/2010    Fracture dislocation  . Femur im nail 01/13/2012    Procedure: INTRAMEDULLARY (IM) RETROGRADE FEMORAL NAILING;  Surgeon: Thera Flake., MD;  Location: [facility] OR;  Service: Orthopedics;  Laterality: Left;  . Femoral artery exploration 01/13/2012    Procedure: FEMORAL ARTERY EXPLORATION;  Surgeon: Pryor Ochoa, MD;  Location: [facility] OR;  Service: Vascular;  Laterality: Left;   HPI:  This is a 71 year old female with past medical history of multiple sclerosis, with an indwelling catheter and multiple decubitus ulcers, recurrent UTIs and recurrent pneumonias with frequent hospitalizations the last several months.  Poor appetite per family.  MBS ordered to fully evaluate function and aspiration risk.       Recommendation/Prognosis   Clinical impression: Pt presented with functional oropharyngeal swallow marked by effortful mastication with solid boluses, mild vallecular solid residue post-swallow (spontaneously cleared), and consistent airway protection with all bolus consistencies.  Pt maintained adequate laryngeal closure when consuming large, successive boluses of thin liquids mixed with solid consistencies.  Overall, swallow was quite functional and airway protection appeared reliable, despite her fatigue level.  Recommend initiating regular diet with thin liquids, assist with feeding as needed, allow  ample time to eat meals.  SLP to follow with family education, then D/C our services.  Recommendations Solid Consistency: Regular Liquid Consistency: Thin Liquid Administration via: Straw;Cup Medication Administration: Whole meds with liquid Supervision: Intermittent supervision to cue for compensatory strategies Follow up Recommendations: None   Individuals Consulted Consulted and Agree with Results and Recommendations: Patient  SLP Goals n/a      Blenda Mounts Laurice 02/26/2012, 12:30 PM

## 2012-02-26 NOTE — Progress Notes (Signed)
Subjective: -Patient more alert today. .  Objective: Filed Vitals:   02/25/12 1000 02/25/12 1059 02/25/12 2043 02/26/12 0521  BP:  116/34 122/46 114/50  Pulse:  103 104 95  Temp:  98 F (36.7 C) 98.2 F (36.8 C) 98 F (36.7 C)  TempSrc: Oral Oral    Resp:  20 20 18   Height:      Weight:      SpO2:  96% 97% 95%   Weight change:   Intake/Output Summary (Last 24 hours) at 02/26/12 1224 Last data filed at 02/26/12 0900  Gross per 24 hour  Intake 2508.83 ml  Output   2450 ml  Net  58.83 ml    General: Alert, awake, oriented x3, in no acute distress.  HEENT: No bruits, no goiter.  Heart: Regular rate and rhythm, without murmurs, rubs, gallops.  Lungs: Crackles left side, bilateral air movement.  Abdomen: Soft, nontender, nondistended, positive bowel sounds.  Neuro: Grossly intact, nonfocal.   Lab Results:  Basename 02/25/12 0342 02/24/12 2000 02/24/12 1242  NA 136 -- 135  K 3.5 -- 4.3  CL 106 -- 101  CO2 21 -- 22  GLUCOSE 92 -- 102*  BUN 24* -- 23  CREATININE 0.92 1.02 --  CALCIUM 9.7 -- 10.7*  MG -- -- --  PHOS -- -- --    Basename 02/25/12 0342 02/24/12 1242  AST 21 23  ALT 8 10  ALKPHOS 141* 182*  BILITOT 0.3 0.4  PROT 6.0 7.3  ALBUMIN 2.2* 2.6*    Basename 02/24/12 1242  LIPASE 14  AMYLASE --    Basename 02/25/12 0342 02/24/12 2000 02/24/12 1242  WBC 6.8 9.6 --  NEUTROABS 4.7 -- 7.6  HGB 9.2* 9.5* --  HCT 29.1* 30.0* --  MCV 78.2 77.5* --  PLT 241 241 --    Basename 02/25/12 1028 02/25/12 0343  CKTOTAL 9 14  CKMB 1.4 1.0  CKMBINDEX -- --  TROPONINI <0.30 <0.30   No components found with this basename: POCBNP:3 No results found for this basename: DDIMER:2 in the last 72 hours No results found for this basename: HGBA1C:2 in the last 72 hours No results found for this basename: CHOL:2,HDL:2,LDLCALC:2,TRIG:2,CHOLHDL:2,LDLDIRECT:2 in the last 72 hours No results found for this basename: TSH,T4TOTAL,FREET3,T3FREE,THYROIDAB in the last 72  hours No results found for this basename: VITAMINB12:2,FOLATE:2,FERRITIN:2,TIBC:2,IRON:2,RETICCTPCT:2 in the last 72 hours  Micro Results: Recent Results (from the past 240 hour(s))  CULTURE, BLOOD (ROUTINE X 2)     Status: Normal (Preliminary result)   Collection Time   02/24/12 12:40 PM      Component Value Range Status Comment   Specimen Description BLOOD ARM RIGHT   Final    Special Requests BOTTLES DRAWN AEROBIC ONLY 8CC   Final    Culture  Setup Time [id number]   Final    Culture     Final    Value:        BLOOD CULTURE RECEIVED NO GROWTH TO DATE CULTURE WILL BE HELD FOR 5 DAYS BEFORE ISSUING A FINAL NEGATIVE REPORT   Report Status PENDING   Incomplete   CULTURE, BLOOD (ROUTINE X 2)     Status: Normal (Preliminary result)   Collection Time   02/24/12 12:43 PM      Component Value Range Status Comment   Specimen Description BLOOD HAND LEFT   Final    Special Requests BOTTLES DRAWN AEROBIC AND ANAEROBIC 10CC   Final    Culture  Setup Time [id number]   Final  Culture     Final    Value:        BLOOD CULTURE RECEIVED NO GROWTH TO DATE CULTURE WILL BE HELD FOR 5 DAYS BEFORE ISSUING A FINAL NEGATIVE REPORT   Report Status PENDING   Incomplete   URINE CULTURE     Status: Normal   Collection Time   02/24/12  1:14 PM      Component Value Range Status Comment   Specimen Description URINE, CATHETERIZED   Final    Special Requests NONE   Final    Culture  Setup Time [id number]   Final    Colony Count NO GROWTH   Final    Culture NO GROWTH   Final    Report Status 02/25/2012 FINAL   Final   MRSA PCR SCREENING     Status: Normal   Collection Time   02/24/12  7:14 PM      Component Value Range Status Comment   MRSA by PCR NEGATIVE  NEGATIVE  Final   URINE CULTURE     Status: Normal (Preliminary result)   Collection Time   02/24/12  8:06 PM      Component Value Range Status Comment   Specimen Description URINE, CATHETERIZED   Final    Special Requests NONE   Final    Culture  Setup  Time [id number]   Final    Colony Count PENDING   Incomplete    Culture Culture reincubated for better growth   Final    Report Status PENDING   Incomplete   CULTURE, BLOOD (ROUTINE X 2)     Status: Normal (Preliminary result)   Collection Time   02/24/12  8:10 PM      Component Value Range Status Comment   Specimen Description BLOOD RIGHT HAND   Final    Special Requests BOTTLES DRAWN AEROBIC ONLY 10CC   Final    Culture  Setup Time [id number]   Final    Culture     Final    Value:        BLOOD CULTURE RECEIVED NO GROWTH TO DATE CULTURE WILL BE HELD FOR 5 DAYS BEFORE ISSUING A FINAL NEGATIVE REPORT   Report Status PENDING   Incomplete   CULTURE, BLOOD (ROUTINE X 2)     Status: Normal (Preliminary result)   Collection Time   02/24/12  8:13 PM      Component Value Range Status Comment   Specimen Description BLOOD LEFT HAND   Final    Special Requests BOTTLES DRAWN AEROBIC ONLY 10CC   Final    Culture  Setup Time [id number]   Final    Culture     Final    Value:        BLOOD CULTURE RECEIVED NO GROWTH TO DATE CULTURE WILL BE HELD FOR 5 DAYS BEFORE ISSUING A FINAL NEGATIVE REPORT   Report Status PENDING   Incomplete     Studies/Results: Ct Head Wo Contrast  02/24/2012  *RADIOLOGY REPORT*  Clinical Data: Fever.  Not responsive.  Being treated for pneumonia.  CT HEAD WITHOUT CONTRAST  Technique:  Contiguous axial images were obtained from the base of the skull through the vertex without contrast.  Comparison: None.  Findings: There is central and cortical atrophy. There is no evidence for hemorrhage, mass lesion, or acute infarction. Bilateral basal ganglia calcifications are present.  Bone windows show hyperostosis frontalis.  No evidence for acute fracture.  Visualized paranasal sinuses and mastoid air cells are unremarkable.  IMPRESSION:  1.  Atrophy. 2. No evidence for acute intracranial abnormality.  Original Report Authenticated By: Patterson Hammersmith, M.D.   Ct Chest W  Contrast  02/24/2012  *RADIOLOGY REPORT*  Clinical Data: Chronic UTI with sepsis.  Evaluate for pneumonia or empyema.  CT CHEST WITH CONTRAST  Technique:  Multidetector CT imaging of the chest was performed following the standard protocol during bolus administration of intravenous contrast.  Contrast: 80mL OMNIPAQUE IOHEXOL 300 MG/ML IJ SOLN  Comparison: Chest radiographs 02/17/2012 and 02/24/2012.  Chest CT 02/13/2012.  Findings: Minimal thyroid heterogeneity appears stable.  There are no enlarged mediastinal or hilar lymph nodes.  Mild coronary artery calcifications are noted.  There is no significant residual pleural or pericardial effusion. The dependent opacities at both lung bases have improved likely reflecting resolving atelectasis.  There is also improving patchy airspace disease in the right upper lobe. No worsening air space disease or mass is identified.  The visualized upper abdomen has a stable appearance.  There is chronic slightly progressive elevation of the left hemidiaphragm. No acute osseous findings are seen.  Motion simulates a fracture of the mid sternum on the sagittal images.  IMPRESSION:  1.  Improving patchy bilateral air space opacities.  No worsening pneumonia identified. 2.  No significant residual pleural effusion.  No evidence of empyema. 3.  Progressive elevation of the left hemidiaphragm.  Original Report Authenticated By: Gerrianne Scale, M.D.   Dg Chest Port 1 View  02/24/2012  *RADIOLOGY REPORT*  Clinical Data: Fever  PORTABLE CHEST - 1 VIEW  Comparison: 02/17/2012  Findings: Some worsening of left retrocardiac consolidation or atelectasis. Linear atelectasis or scarring in the right midlung stable.  Heart size normal.  No effusion.  IMPRESSION:  Worsening left retrocardiac atelectasis or pneumonia.  Original Report Authenticated By: Osa Craver, M.D.    Medications: I have reviewed the patient's current medications.   Patient Active Hospital Problem  List: 1.UTI (lower urinary tract infection) (02/12/2012)  Urine cultures pending. Afebrile WBC down. BP stable. Continue IV fluids and antibiotics levofloxacin.   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  Multiple sclerosis (12/22/2010) Stable. PT consult.  Normocytic anemia (02/06/2012)  Stable. Most likely anemia of chronic disease.  Leukocytosis (02/12/2012) Resolved.  Hypercalcemia (02/24/2012) Resolved. 2/2 dehydration.  Metabolic encephalopathy (02/24/2012) -Resolved. Secondarily to UTI.   LOS: 2 days   Marinda Elk M.D.  [facility] 02/26/2012, 12:24 PM

## 2012-02-26 NOTE — Progress Notes (Signed)
INITIAL ADULT NUTRITION ASSESSMENT Date: 02/26/2012   Time: 11:38 AM  Reason for Assessment: Nutrition Risk Report  ASSESSMENT: Female 71 y.o.  Dx: UTI (lower urinary tract infection)  Hx:  Past Medical History  Diagnosis Date  . Multiple sclerosis   . GERD (gastroesophageal reflux disease)   . Depression   . Recurrent UTI   . Recurrent pneumonia   . Chronic constipation   . Diverticulosis     Related Meds:     . amitriptyline  100 mg Oral QHS  . FLUoxetine  40 mg Oral Daily  . heparin  5,000 Units Subcutaneous Q8H  . levofloxacin (LEVAQUIN) IV  750 mg Intravenous Q24H  . mulitivitamin with minerals  1 tablet Oral Daily    Ht: 5\' 8"  (172.7 cm)  Wt: 169 lb 15.6 oz (77.1 kg)  Ideal Wt: 63.6 kg % Ideal Wt: 121%  Usual Wt: unable to obtain % Usual Wt: ---  Body mass index is 25.84 kg/(m^2).  Food/Nutrition Related Hx: dysphagia per admission nutrition screen  Labs:  CMP     Component Value Date/Time   NA 136 02/25/2012 0342   K 3.5 02/25/2012 0342   CL 106 02/25/2012 0342   CO2 21 02/25/2012 0342   GLUCOSE 92 02/25/2012 0342   BUN 24* 02/25/2012 0342   CREATININE 0.92 02/25/2012 0342   CALCIUM 9.7 02/25/2012 0342   PROT 6.0 02/25/2012 0342   ALBUMIN 2.2* 02/25/2012 0342   AST 21 02/25/2012 0342   ALT 8 02/25/2012 0342   ALKPHOS 141* 02/25/2012 0342   BILITOT 0.3 02/25/2012 0342   GFRNONAA 62* 02/25/2012 0342   GFRAA 71* 02/25/2012 0342    I/O last 3 completed shifts: In: 3883.8 [I.V.:3883.8] Out: 1900 [Urine:1900] Total I/O In: -  Out: 1100 [Urine:1100]   CBG (last 3)   Basename 02/24/12 1255  GLUCAP 108*    Diet Order: NPO  Supplements/Tube Feeding: N/A  IVF:    sodium chloride Last Rate: 125 mL/hr at 02/26/12 1000    Estimated Nutritional Needs:   Kcal: 1900-2100 Protein: 95-105 gm Fluid: 1.9-2.1 L  RD unable to obtain much nutrition hx from pt at this time -- very sleepy upon RD entry & not responsive to RD questions; NPO at this time; PO intake prior to  NPO status variable at 10-50% per flowsheet records (Dysphagia 2, thin liquids); for MBSS today; noted pt with Stage III pressure ulcer; would benefit from addition of supplementation once/as able  NUTRITION DIAGNOSIS: -Increased nutrient needs (NI-5.1).  Status: Ongoing  RELATED TO: wound healing  AS EVIDENCE BY: Stage III pressure ulcer, estimated needs  MONITORING/EVALUATION(Goals): Goal: meet >90% of estimated nutrition needs to prevent malnutrition Monitor: PO intake, labs, weight, I/O's  EDUCATION NEEDS: -No education needs identified at this time  INTERVENTION:  Await MBSS results/SLP recommendations  Add nutrition supplements when/as able  RD to follow for nutrition care plan  Dietitian #: [phone]  DOCUMENTATION CODES Per approved criteria  -Not Applicable    Alger Memos 02/26/2012, 11:38 AM

## 2012-02-26 NOTE — Progress Notes (Signed)
Clinical Social Worker completed psychosocial assessment and placed in shadow chart. Pt is from [facility] and has [facility] services following at facility. Clinical Social Worker spoke with pt daughter as pt was sleeping when attempted visit. Pt daughter agreeable to pt returning to [place] with PACE services following. Clinical Social Worker spoke with PACE case worker who reports pt has a MOST form which is dated 12/24/2011 this is contradictory to the information on file that pt is a DNR. MD informed and will follow up to clarify. Clinical Social Worker to facilitate pt discharge needs when pt medically stable for discharge.  Jacklynn Lewis, MSW, LCSWA  Clinical Social Work [phone]

## 2012-02-27 ENCOUNTER — Inpatient Hospital Stay (HOSPITAL_COMMUNITY)

## 2012-02-27 MED ORDER — OXYBUTYNIN CHLORIDE 5 MG PO TABS
5.0000 mg | ORAL_TABLET | Freq: Two times a day (BID) | ORAL | Status: DC
  Administered 2012-02-27 – 2012-03-08 (×14): 5 mg via ORAL
  Filled 2012-02-27 (×21): qty 1

## 2012-02-27 MED ORDER — ACETAMINOPHEN 325 MG PO TABS
650.0000 mg | ORAL_TABLET | Freq: Four times a day (QID) | ORAL | Status: DC | PRN
  Administered 2012-02-27 – 2012-03-07 (×4): 650 mg via ORAL
  Filled 2012-02-27 (×4): qty 2

## 2012-02-27 MED ORDER — ENSURE CLINICAL ST REVIGOR PO LIQD
237.0000 mL | Freq: Three times a day (TID) | ORAL | Status: DC
  Administered 2012-02-29 (×2): 237 mL via ORAL
  Administered 2012-03-01: 12:00:00 via ORAL
  Administered 2012-03-01 – 2012-03-06 (×3): 237 mL via ORAL
  Administered 2012-03-07: 19:00:00 via ORAL
  Administered 2012-03-07 – 2012-03-08 (×2): 237 mL via ORAL

## 2012-02-27 MED ORDER — FLUCONAZOLE 200 MG PO TABS: 200.0000 mg | ORAL_TABLET | Freq: Every day | ORAL | Status: DC

## 2012-02-27 MED ORDER — FLUCONAZOLE 100 MG PO TABS
100.0000 mg | ORAL_TABLET | Freq: Every day | ORAL | Status: DC
  Administered 2012-02-28 – 2012-03-01 (×3): 100 mg via ORAL
  Filled 2012-02-27 (×5): qty 1

## 2012-02-27 MED ORDER — FLUCONAZOLE 200 MG PO TABS
200.0000 mg | ORAL_TABLET | Freq: Once | ORAL | Status: AC
  Administered 2012-02-27: 200 mg via ORAL
  Filled 2012-02-27: qty 1

## 2012-02-27 NOTE — Progress Notes (Signed)
Paged Md regarding pharmacology recommendations for urine culture. Also made MD aware of leakage around foley catheter. Discussed number of episodes of leakage and amount of urine in drainage bag. Foley catheter was changed at hs yesterday due to back up, clogged tube and leakage per Rn shift report. MD Sawhney returned paged. No new orders at this time. MD to follow up with pt.

## 2012-02-27 NOTE — Progress Notes (Signed)
Subjective: Internal medicine was consulted to take over care from [facility] as the patient is a patient of Dr. Malon Kindle with the [facility] program. After review of the medical record it does seem that this patient was admitted for altered mental status. At this time UTI was suspected due to chronic indwelling Foley cath catheter. A urine culture was obtained at that time and levofloxacin was started. This treatment was continued for several days. She did seem to becoming more responsive during hospital stay. Per notes. She does have chronic indwelling catheter. She has been having some leaking around the catheter. There was originally some concern for a pneumonia however this was ruled out with CT of the chest. There was also a CT of the head done to rule out an acute stroke. Currently today talking to her she is able to tell me what is going on, that she is having some abdominal pain. She states that she is not having pain in her legs. She states that she does have chronic sores on her buttocks. She states that she is not feeling well. When asked her she states that she's feeling a little bit better than yesterday but still not good. She thinks she is still having some fevers and chills. She was very cold when I talk to her. I did adjust the thermostat and give her an additional blanket. She is not having any cough, and denies any kind of congestion or runny nose symptoms. She is not having any chest pain. She's not having any leg swelling or leg pain at this time. She is able to tell me her name and knows that she is in the hospital. She is able to tell me about her son and daughter and grandchildren. She states that both her son and her daughter live in [place] and her grandchildren are of young age. She has no acute complaints however she states that she has been doing very well today. No headache, no muscle aches. She states that she is nauseous and has thrown up about 2-3 times today. No diarrhea  although she states that she does not known when she's going to have a bowel movement until it happens.  Objective: Vital signs in last 24 hours: Filed Vitals:   02/26/12 1300 02/26/12 2102 02/27/12 0451 02/27/12 1342  BP: 121/67 147/53 125/49 142/68  Pulse: 93 105 96 106  Temp: 97.1 F (36.2 C) 98.3 F (36.8 C) 99.1 F (37.3 C) 97.9 F (36.6 C)  TempSrc:      Resp: 18 18 18 18   Height:      Weight:      SpO2: 100% 100% 97% 99%   Weight change:   Intake/Output Summary (Last 24 hours) at 02/27/12 1840 Last data filed at 02/27/12 1006  Gross per 24 hour  Intake    120 ml  Output      0 ml  Net    120 ml   Physical Exam: General: resting in bed HEENT: PERRL, EOMI, no scleral icterus Cardiac: RRR, no rubs, murmurs or gallops Pulm: clear to auscultation bilaterally, moving normal volumes of air Abd: lower abdomen was fairly hard, erythema around the catheter and swelling, leaking around the catheter, mildly tender, nondistended, BS present Ext: warm and well perfused, mild pedal edema, heels are in protective holster to prevent bed sores Backside: There are multiple bed sores on her buttocks Neuro: alert and oriented X2, does not know date, moves all 4 extremities, able to follow 1-2 step commands  Lab Results: Basic Metabolic Panel:  Lab 02/25/12 6962 02/24/12 2000 02/24/12 1242  NA 136 -- 135  K 3.5 -- 4.3  CL 106 -- 101  CO2 21 -- 22  GLUCOSE 92 -- 102*  BUN 24* -- 23  CREATININE 0.92 1.02 --  CALCIUM 9.7 -- 10.7*  MG -- -- --  PHOS -- -- --   Liver Function Tests:  Lab 02/25/12 0342 02/24/12 1242  AST 21 23  ALT 8 10  ALKPHOS 141* 182*  BILITOT 0.3 0.4  PROT 6.0 7.3  ALBUMIN 2.2* 2.6*    Lab 02/24/12 1242  LIPASE 14  AMYLASE --    Lab 02/24/12 1243  AMMONIA 35   CBC:  Lab 02/25/12 0342 02/24/12 2000 02/24/12 1242  WBC 6.8 9.6 --  NEUTROABS 4.7 -- 7.6  HGB 9.2* 9.5* --  HCT 29.1* 30.0* --  MCV 78.2 77.5* --  PLT 241 241 --   Cardiac  Enzymes:  Lab 02/25/12 1028 02/25/12 0343  CKTOTAL 9 14  CKMB 1.4 1.0  CKMBINDEX -- --  TROPONINI <0.30 <0.30   CBG:  Lab 02/24/12 1255  GLUCAP 108*   Coagulation:  Lab 02/24/12 1242  LABPROT 15.9*  INR 1.24   Urinalysis:  Lab 02/24/12 1314  COLORURINE YELLOW  LABSPEC 1.020  PHURINE 6.0  GLUCOSEU NEGATIVE  HGBUR LARGE*  BILIRUBINUR NEGATIVE  KETONESUR NEGATIVE  PROTEINUR 100*  UROBILINOGEN 0.2  NITRITE NEGATIVE  LEUKOCYTESUR LARGE*   Micro Results: Recent Results (from the past 240 hour(s))  CULTURE, BLOOD (ROUTINE X 2)     Status: Normal (Preliminary result)   Collection Time   02/24/12 12:40 PM      Component Value Range Status Comment   Specimen Description BLOOD ARM RIGHT   Final    Special Requests BOTTLES DRAWN AEROBIC ONLY 8CC   Final    Culture  Setup Time [id number]   Final    Culture     Final    Value:        BLOOD CULTURE RECEIVED NO GROWTH TO DATE CULTURE WILL BE HELD FOR 5 DAYS BEFORE ISSUING A FINAL NEGATIVE REPORT   Report Status PENDING   Incomplete   CULTURE, BLOOD (ROUTINE X 2)     Status: Normal (Preliminary result)   Collection Time   02/24/12 12:43 PM      Component Value Range Status Comment   Specimen Description BLOOD HAND LEFT   Final    Special Requests BOTTLES DRAWN AEROBIC AND ANAEROBIC 10CC   Final    Culture  Setup Time [id number]   Final    Culture     Final    Value:        BLOOD CULTURE RECEIVED NO GROWTH TO DATE CULTURE WILL BE HELD FOR 5 DAYS BEFORE ISSUING A FINAL NEGATIVE REPORT   Report Status PENDING   Incomplete   URINE CULTURE     Status: Normal   Collection Time   02/24/12  1:14 PM      Component Value Range Status Comment   Specimen Description URINE, CATHETERIZED   Final    Special Requests NONE   Final    Culture  Setup Time [id number]   Final    Colony Count NO GROWTH   Final    Culture NO GROWTH   Final    Report Status 02/25/2012 FINAL   Final   MRSA PCR SCREENING     Status: Normal   Collection Time  02/24/12  7:14 PM      Component Value Range Status Comment   MRSA by PCR NEGATIVE  NEGATIVE  Final   URINE CULTURE     Status: Normal   Collection Time   02/24/12  8:06 PM      Component Value Range Status Comment   Specimen Description URINE, CATHETERIZED   Final    Special Requests NONE   Final    Culture  Setup Time [id number]   Final    Colony Count >=100,000 COLONIES/ML   Final    Culture YEAST   Final    Report Status 02/26/2012 FINAL   Final   CULTURE, BLOOD (ROUTINE X 2)     Status: Normal (Preliminary result)   Collection Time   02/24/12  8:10 PM      Component Value Range Status Comment   Specimen Description BLOOD RIGHT HAND   Final    Special Requests BOTTLES DRAWN AEROBIC ONLY 10CC   Final    Culture  Setup Time [id number]   Final    Culture     Final    Value:        BLOOD CULTURE RECEIVED NO GROWTH TO DATE CULTURE WILL BE HELD FOR 5 DAYS BEFORE ISSUING A FINAL NEGATIVE REPORT   Report Status PENDING   Incomplete   CULTURE, BLOOD (ROUTINE X 2)     Status: Normal (Preliminary result)   Collection Time   02/24/12  8:13 PM      Component Value Range Status Comment   Specimen Description BLOOD LEFT HAND   Final    Special Requests BOTTLES DRAWN AEROBIC ONLY 10CC   Final    Culture  Setup Time [id number]   Final    Culture     Final    Value:        BLOOD CULTURE RECEIVED NO GROWTH TO DATE CULTURE WILL BE HELD FOR 5 DAYS BEFORE ISSUING A FINAL NEGATIVE REPORT   Report Status PENDING   Incomplete    Studies/Results: Dg Swallowing Func-no Report  02/26/2012  CLINICAL DATA: dysphagia   FLUOROSCOPY FOR SWALLOWING FUNCTION STUDY:  Fluoroscopy was provided for swallowing function study, which was  administered by a speech pathologist.  Final results and recommendations  from this study are contained within the speech pathology report.     Medications: I have reviewed the patient's current medications. Scheduled Meds:   . amitriptyline  100 mg Oral QHS  . feeding  supplement  237 mL Oral TID BM  . fluconazole  100 mg Oral Daily  . fluconazole  200 mg Oral Once  . FLUoxetine  40 mg Oral Daily  . heparin  5,000 Units Subcutaneous Q8H  . levofloxacin (LEVAQUIN) IV  750 mg Intravenous Q24H  . mulitivitamin with minerals  1 tablet Oral Daily  . oxybutynin  5 mg Oral BID  . DISCONTD: fluconazole  200 mg Oral Daily   Continuous Infusions:   . sodium chloride 20 mL/hr (02/26/12 1811)   PRN Meds:.oxyCODONE Assessment/Plan: UTI (lower urinary tract infection) - She presented with complaints of altered mental status on 02/24/11.  Patient has history of indwelling catheter for last 8-9 months. She was treated for UTI with Levaquin and her cultures returned positive for 100,000 colonies of yeast today. We will leave levoquin on her to treat possible pneumonia. Will add diflucan 200 mg PO today and 100 mg daily after that. She may need prophylactic course of diflucan while she has urinary catheter as  this does appear to be recurrent candiuria. As per the nursing report the patient has been having episodes of incontinence for last 2 days and there is barely any collection in her urobag. A bladder scan was negative for any residual.  On exam she is moderately tender to palpation in her both lower quadrants and appears distended. We curb- sided urology who thinks that this is likely secondary to bladder spasms from her inappropriately treated UTI and it might take 5-7 days for her symptoms to get better. -Start her on Diflucan for her IUD and cultures consistent with yeast infection. -Start her on Ditropan - Will get renal ultrasound.  Fever - Blood cultures have been drawn at admission and were preliminary negative. However she did have a fever this evening and so we did get repeat blood cultures today. She has had a history of bacteremia.    PRESSURE ULCER HIP -  Will get wound care consult to care for her decubitus ulcers on her backside. She states that there are 5.  Heels are in protective holder to prevent ulcer.    Multiple sclerosis - Stable on her medications.    Normocytic anemia - Likely due to chronic disease and appears to be stable around 9.    Leukocytosis - Possibly due to infection. It did seem to be trending down.    Hypercalcemia - Appears to have resolved at this time but likely etiology would be dehydration causing concentration on admission.   Metabolic encephalopathy - Hard to say degree of resolution as this was our first time evaluating this patient and will call the daughter tomorrow for further information about baseline status.   Disposition - Will call Dr. [name] tomorrow to discuss with him as she is a [facility] patient. She will likely need several more days here until her UTI starts to improve.    LOS: 3 days   Genella Mech 02/27/2012, 6:40 PM

## 2012-02-28 ENCOUNTER — Inpatient Hospital Stay (HOSPITAL_COMMUNITY)

## 2012-02-28 MED ORDER — ONDANSETRON HCL 4 MG/2ML IJ SOLN
INTRAMUSCULAR | Status: AC
  Filled 2012-02-28: qty 2

## 2012-02-28 MED ORDER — ONDANSETRON HCL 4 MG/2ML IJ SOLN
4.0000 mg | Freq: Four times a day (QID) | INTRAMUSCULAR | Status: DC | PRN
  Administered 2012-02-28 – 2012-03-02 (×2): 4 mg via INTRAVENOUS
  Filled 2012-02-28 (×2): qty 2

## 2012-02-28 MED ORDER — COLLAGENASE 250 UNIT/GM EX OINT
TOPICAL_OINTMENT | Freq: Every day | CUTANEOUS | Status: DC
  Administered 2012-02-28 – 2012-03-08 (×10): via TOPICAL
  Filled 2012-02-28: qty 30

## 2012-02-28 MED ORDER — ONDANSETRON 4 MG PO TBDP
4.0000 mg | ORAL_TABLET | Freq: Three times a day (TID) | ORAL | Status: DC | PRN
Start: 2012-02-28 — End: 2012-03-08
  Administered 2012-03-01: 4 mg via ORAL
  Filled 2012-02-28: qty 1

## 2012-02-28 NOTE — Progress Notes (Signed)
PT TREATMENT NOTE  02/28/12 1424  PT Visit Information  Last PT Received On 02/28/12  Precautions  Precautions Fall  Restrictions  Weight Bearing Restrictions No  Bed Mobility  Bed Mobility Yes  Rolling Right (pt. )  Rolling Left 1: +2 Total assist;Patient percentage (comment);With rail (pt. 10%)  Rolling Left Details (indicate cue type and reason) difficulty with initiating roll or gripping  rail   Left Sidelying to Sit 1: +2 Total assist;Other (comment) (pt. zero assist)  Sit to Supine 1: +2 Total assist;Patient percentage (comment) (pt. = 0)  Transfers  Transfers No  Ambulation/Gait  Ambulation/Gait No  Stairs No  Wheelchair Mobility  Wheelchair Mobility No  Static Sitting Balance  Static Sitting - Balance Support Bilateral upper extremity supported;Feet supported  Static Sitting - Level of Assistance 3: Mod assist  Static Sitting - Comment/# of Minutes 5  PT - End of Session  Activity Tolerance Patient limited by fatigue;Patient limited by pain  Patient left in bed;with call bell in reach  Nurse Communication Mobility status for transfers  General  Behavior During Session [facility] for tasks performed  Cognition Impaired, at baseline  PT - Assessment/Plan  Comments on Treatment Session Pt. remains dependent in bed mobility.  Recommend nursing staff use lift equipment for pt. OOB.  PT Frequency Min 2X/week  Recommendations for Other Services OT consult  Follow Up Recommendations Skilled nursing facility  Equipment Recommended Defer to next venue  Acute Rehab PT Goals  PT Goal: Rolling Supine to Left Side - Progress Progressing toward goal  PT Goal: Supine/Side to Sit - Progress Progressing toward goal  PT Goal: Sit at [facility] Of Bed - Progress Progressing toward goal  PT Goal: Sit to Supine/Side - Progress Progressing toward goal  Acute Rehabilitation Services [phone] (pager) Weldon Picking PT

## 2012-02-28 NOTE — Consult Note (Signed)
WOC consult Note Reason for Consult:Assessment of left ankle and sacrum wounds. Wound type:Left lateral malleolus wound is suspicious of trauma present at least one month. 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 Right buttock superior wound is Stage III pressure ulcer, inferior wound is unstageable pressure ulcer. Pressure Ulcer POA: Yes Measurement:Left lateral malleolus wound 1.5 cm x 1 cm x 0.5 cm. Sacral pressure ulcer 1 cm x 1 cm x 0.5 cm. Right superior buttock pressure ulcer 1 cm x 1 cm x 0.5 cm. Inferior right buttock pressure ulcer 3 cm x 2 cm x 0.2 cm. Wound bed:L lateral malleolus is pale pink with 90% yellow slough. Sacrum is 100% yellow slough. Superior right buttock is 100% yellow slough. Inferior right buttock wound is dark red and black. Drainage (amount, consistency, odor) Moderate serous drainage from L lateral malleolus wound. No drainage from any of the other wounds. Periwound: Intact Dressing procedure/placement/frequency:Placed silicone dressing to L ankle will add enzymatic debridement ointment to wound to be applied daily. Sacrum and right Stage III pressure ulcers will be treated with enzymatic debridement ointment covered with silicone dressing to be changed daily. Unstageable pressure ulcer on right buttock will be monitored for evolution and will be covered with silicone dressing without enzymatic debridement ointment.   Re consult if needed, WOC will not follow Peggy L. Drum, Charity fundraiser, WOC student Joneisha Miles [place], [place] [phone]

## 2012-02-28 NOTE — Progress Notes (Signed)
Subjective: The patient is alert and oriented but is not cognitively intact. She is having nausea and vomiting still. Talked to Dr. [name] about the health of [name] [name]. It seems she has been functional until time of fracture in January and has been declining since then. This is a depression of her baseline but seems like it may be typical of her new depression. Spoke with her daughter this afternoon and she corraberates the story from Dr. [name]. She adds that she would like this resolved prior to her leaving to the nursing home and has been frustrated by multiple admissions this year.   Objective: Vital signs in last 24 hours: Filed Vitals:   02/27/12 0451 02/27/12 1342 02/27/12 2122 02/28/12 0542  BP: 125/49 142/68 118/53 111/70  Pulse: 96 106 102 90  Temp: 99.1 F (37.3 C) 97.9 F (36.6 C) 98.3 F (36.8 C) 97.9 F (36.6 C)  TempSrc:      Resp: 18 18 19 18   Height:      Weight:      SpO2: 97% 99% 99% 98%   Weight change:   Intake/Output Summary (Last 24 hours) at 02/28/12 1209 Last data filed at 02/28/12 0500  Gross per 24 hour  Intake   1760 ml  Output      0 ml  Net   1760 ml   Physical Exam: General: resting in bed HEENT: PERRL, EOMI, no scleral icterus Cardiac: RRR, no rubs, murmurs or gallops Pulm: clear to auscultation bilaterally, moving normal volumes of air Abd: lower abdomen was fairly hard, erythema around the catheter and swelling, leaking around the catheter, mildly tender, nondistended, BS present Ext: warm and well perfused, mild pedal edema, heels are in protective holster to prevent bed sores Backside: There are multiple bed sores on her buttocks Neuro: alert and oriented X2, does not know date, moves all 4 extremities, able to follow 1-2 step commands  Lab Results: Basic Metabolic Panel:  Lab 02/25/12 1610 02/24/12 2000 02/24/12 1242  NA 136 -- 135  K 3.5 -- 4.3  CL 106 -- 101  CO2 21 -- 22  GLUCOSE 92 -- 102*  BUN 24* -- 23  CREATININE 0.92  1.02 --  CALCIUM 9.7 -- 10.7*  MG -- -- --  PHOS -- -- --   Liver Function Tests:  Lab 02/25/12 0342 02/24/12 1242  AST 21 23  ALT 8 10  ALKPHOS 141* 182*  BILITOT 0.3 0.4  PROT 6.0 7.3  ALBUMIN 2.2* 2.6*    Lab 02/24/12 1242  LIPASE 14  AMYLASE --    Lab 02/24/12 1243  AMMONIA 35   CBC:  Lab 02/25/12 0342 02/24/12 2000 02/24/12 1242  WBC 6.8 9.6 --  NEUTROABS 4.7 -- 7.6  HGB 9.2* 9.5* --  HCT 29.1* 30.0* --  MCV 78.2 77.5* --  PLT 241 241 --   Cardiac Enzymes:  Lab 02/25/12 1028 02/25/12 0343  CKTOTAL 9 14  CKMB 1.4 1.0  CKMBINDEX -- --  TROPONINI <0.30 <0.30   CBG:  Lab 02/24/12 1255  GLUCAP 108*   Coagulation:  Lab 02/24/12 1242  LABPROT 15.9*  INR 1.24   Urinalysis:  Lab 02/24/12 1314  COLORURINE YELLOW  LABSPEC 1.020  PHURINE 6.0  GLUCOSEU NEGATIVE  HGBUR LARGE*  BILIRUBINUR NEGATIVE  KETONESUR NEGATIVE  PROTEINUR 100*  UROBILINOGEN 0.2  NITRITE NEGATIVE  LEUKOCYTESUR LARGE*   Micro Results: Recent Results (from the past 240 hour(s))  CULTURE, BLOOD (ROUTINE X 2)  Status: Normal (Preliminary result)   Collection Time   02/24/12 12:40 PM      Component Value Range Status Comment   Specimen Description BLOOD ARM RIGHT   Final    Special Requests BOTTLES DRAWN AEROBIC ONLY 8CC   Final    Culture  Setup Time [id number]   Final    Culture     Final    Value:        BLOOD CULTURE RECEIVED NO GROWTH TO DATE CULTURE WILL BE HELD FOR 5 DAYS BEFORE ISSUING A FINAL NEGATIVE REPORT   Report Status PENDING   Incomplete   CULTURE, BLOOD (ROUTINE X 2)     Status: Normal (Preliminary result)   Collection Time   02/24/12 12:43 PM      Component Value Range Status Comment   Specimen Description BLOOD HAND LEFT   Final    Special Requests BOTTLES DRAWN AEROBIC AND ANAEROBIC 10CC   Final    Culture  Setup Time [id number]   Final    Culture     Final    Value:        BLOOD CULTURE RECEIVED NO GROWTH TO DATE CULTURE WILL BE HELD FOR 5 DAYS  BEFORE ISSUING A FINAL NEGATIVE REPORT   Report Status PENDING   Incomplete   URINE CULTURE     Status: Normal   Collection Time   02/24/12  1:14 PM      Component Value Range Status Comment   Specimen Description URINE, CATHETERIZED   Final    Special Requests NONE   Final    Culture  Setup Time [id number]   Final    Colony Count NO GROWTH   Final    Culture NO GROWTH   Final    Report Status 02/25/2012 FINAL   Final   MRSA PCR SCREENING     Status: Normal   Collection Time   02/24/12  7:14 PM      Component Value Range Status Comment   MRSA by PCR NEGATIVE  NEGATIVE  Final   URINE CULTURE     Status: Normal   Collection Time   02/24/12  8:06 PM      Component Value Range Status Comment   Specimen Description URINE, CATHETERIZED   Final    Special Requests NONE   Final    Culture  Setup Time [id number]   Final    Colony Count >=100,000 COLONIES/ML   Final    Culture YEAST   Final    Report Status 02/26/2012 FINAL   Final   CULTURE, BLOOD (ROUTINE X 2)     Status: Normal (Preliminary result)   Collection Time   02/24/12  8:10 PM      Component Value Range Status Comment   Specimen Description BLOOD RIGHT HAND   Final    Special Requests BOTTLES DRAWN AEROBIC ONLY 10CC   Final    Culture  Setup Time [id number]   Final    Culture     Final    Value:        BLOOD CULTURE RECEIVED NO GROWTH TO DATE CULTURE WILL BE HELD FOR 5 DAYS BEFORE ISSUING A FINAL NEGATIVE REPORT   Report Status PENDING   Incomplete   CULTURE, BLOOD (ROUTINE X 2)     Status: Normal (Preliminary result)   Collection Time   02/24/12  8:13 PM      Component Value Range Status Comment   Specimen Description BLOOD LEFT HAND  Final    Special Requests BOTTLES DRAWN AEROBIC ONLY 10CC   Final    Culture  Setup Time [id number]   Final    Culture     Final    Value:        BLOOD CULTURE RECEIVED NO GROWTH TO DATE CULTURE WILL BE HELD FOR 5 DAYS BEFORE ISSUING A FINAL NEGATIVE REPORT   Report Status PENDING    Incomplete    Studies/Results: US Renal  02/28/2012  *RADIOLOGY REPORT*  Clinical Data: History of recurrent urinary tract infection. Neurogenic bladder.  Elevated BUN.  RENAL/URINARY TRACT ULTRASOUND COMPLETE  Comparison:  None.  Findings:  Right Kidney:  Measures 10.8 cm and appears normal without stone, mass or hydronephrosis.  Left Kidney:  Measures measures 11.1 cm.  A 1.0 cm cyst is noted. No stone or hydronephrosis.  Bladder:  Debris is present within the urinary bladder.  A Foley catheter is in place.  Other findings:  Visualized liver parenchyma is markedly echogenic. The spleen measures 12.8 x 5.2 x 14.8 cm for a volume of 513.0 cm cubed.  IMPRESSION:  1.  Negative for hydronephrosis or stone. 2.  Debris is present within the urinary bladder. 3.  Fatty infiltration of liver. 4.  Splenomegaly.  Original Report Authenticated By: Bernadene Bell. Maricela Curet, M.D.   Medications: I have reviewed the patient's current medications. Scheduled Meds:    . amitriptyline  100 mg Oral QHS  . feeding supplement  237 mL Oral TID BM  . fluconazole  100 mg Oral Daily  . fluconazole  200 mg Oral Once  . FLUoxetine  40 mg Oral Daily  . heparin  5,000 Units Subcutaneous Q8H  . levofloxacin (LEVAQUIN) IV  750 mg Intravenous Q24H  . mulitivitamin with minerals  1 tablet Oral Daily  . ondansetron      . oxybutynin  5 mg Oral BID  . DISCONTD: fluconazole  200 mg Oral Daily   Continuous Infusions:    . sodium chloride 20 mL/hr at 02/28/12 0500   PRN Meds:.acetaminophen, ondansetron (ZOFRAN) IV, ondansetron, oxyCODONE Assessment/Plan: UTI (lower urinary tract infection) - She presented with complaints of altered mental status on 02/24/11.  Patient has history of indwelling catheter for last 8-9 months. She was treated for UTI with Levaquin and her cultures returned positive for 100,000 colonies of yeast today. We will leave levoquin on her to treat possible pneumonia. She will be on diflucan 100 mg daily. She  may need prophylactic course of diflucan while she has urinary catheter as this does appear to be recurrent candiuria. As per the nursing report the patient has been having episodes of incontinence for last 2 days and there is barely any collection in her urobag. A bladder scan was negative for any residual.  On exam she is moderately tender to palpation in her both lower quadrants and appears distended. We curb- sided urology who thinks that this is likely secondary to bladder spasms from her inappropriately treated UTI and it might take 5-7 days for her symptoms to get better. After speaking with Dr. [name] have decided to get formal urology consult regarding possible supra-pubic catheter during hospital stay as she has had multiple problems with her catheter. Did talk to urology and they do not believe that this is an emergent issue and have recommended making an out-pt appointment for her for evaluation for supra-pubic catheter after this bout of uro-sepsis has resolved. Gave zofran as she is still having some vomiting and significant nausea.  Fever - Blood cultures have been drawn at admission and were preliminary negative. However she did have a fever  yesterday although no fevers since then. Blood cultures drawn yesterday are still pending.    PRESSURE ULCER HIP -  Will get wound care consult to care for her decubitus ulcers on her backside. She states that there are 5. Heels are in protective holder to prevent ulcer.    Multiple sclerosis - Stable on her medications.    Normocytic anemia - Likely due to chronic disease and appears to be stable around 9.    Leukocytosis - Possibly due to infection. It did seem to be trending down.    Hypercalcemia - Appears to have resolved at this time but likely etiology would be dehydration causing concentration on admission.   Metabolic encephalopathy - Pt is alert and oriented but not cognitively intact. Will continue to watch and may be an aspect of  depression involved.   Disposition - Did call Dr. [name] to discuss with him as she is a [facility] patient. She will likely need several more days here until her UTI starts to improve. Have spoken with urology and will get her an out-pt appointment to evaluate for supra pubic catheter. Gave some zofran today to see if we can improve her nausea to improve appetite.    LOS: 4 days   Genella Mech 02/28/2012, 12:09 PM

## 2012-02-29 DIAGNOSIS — R7881 Bacteremia: Secondary | ICD-10-CM

## 2012-02-29 DIAGNOSIS — N390 Urinary tract infection, site not specified: Secondary | ICD-10-CM

## 2012-02-29 DIAGNOSIS — N3289 Other specified disorders of bladder: Secondary | ICD-10-CM

## 2012-02-29 DIAGNOSIS — R509 Fever, unspecified: Secondary | ICD-10-CM

## 2012-02-29 LAB — BASIC METABOLIC PANEL
BUN: 12 mg/dL (ref 6–23)
CO2: 22 mEq/L (ref 19–32)
Calcium: 9.7 mg/dL (ref 8.4–10.5)
Creatinine, Ser: 0.58 mg/dL (ref 0.50–1.10)
GFR calc non Af Amer: >90 mL/min (ref >90)
Glucose, Bld: 96 mg/dL (ref 70–99)
Sodium: 136 mEq/L (ref 135–145)

## 2012-02-29 MED ORDER — POTASSIUM CHLORIDE CRYS ER 20 MEQ PO TBCR
40.0000 meq | EXTENDED_RELEASE_TABLET | Freq: Four times a day (QID) | ORAL | Status: AC
  Administered 2012-02-29 (×3): 40 meq via ORAL
  Filled 2012-02-29 (×3): qty 2

## 2012-02-29 MED ORDER — LEVOFLOXACIN 750 MG PO TABS
750.0000 mg | ORAL_TABLET | Freq: Every day | ORAL | Status: DC
  Filled 2012-02-29: qty 1

## 2012-02-29 NOTE — Progress Notes (Signed)
Subjective: The patient is alert and oriented but is not cognitively intact. She is having less nausea and vomiting today. She is still not taking in good PO. No complaints this morning. Will call and talk to the daughter later today.   Objective: Vital signs in last 24 hours: Filed Vitals:   02/28/12 0542 02/28/12 1252 02/28/12 2031 02/29/12 0608  BP: 111/70 123/69 111/55 114/84  Pulse: 90 99  93  Temp: 97.9 F (36.6 C) 97.9 F (36.6 C) 100.2 F (37.9 C) 97.3 F (36.3 C)  TempSrc:   Oral   Resp: 18 18 18 18   Height:      Weight:      SpO2: 98% 100% 98% 97%   Weight change:  No intake or output data in the 24 hours ending 02/29/12 0758 Physical Exam: General: resting in bed HEENT: PERRL, EOMI, no scleral icterus Cardiac: RRR, no rubs, murmurs or gallops Pulm: clear to auscultation bilaterally, moving normal volumes of air Abd: lower abdomen was fairly hard, erythema around the catheter and swelling, leaking around the catheter, mildly tender, nondistended, BS present Ext: warm and well perfused, mild pedal edema, heels are in protective holster to prevent bed sores Backside: There are multiple bed sores on her buttocks Neuro: alert and oriented X2, does not know date, moves all 4 extremities, able to follow 1-2 step commands  Lab Results: Basic Metabolic Panel:  Lab 02/29/12 1610 02/25/12 0342  NA 136 136  K 3.0* 3.5  CL 106 106  CO2 22 21  GLUCOSE 96 92  BUN 12 24*  CREATININE 0.58 0.92  CALCIUM 9.7 9.7  MG -- --  PHOS -- --   Liver Function Tests:  Lab 02/25/12 0342 02/24/12 1242  AST 21 23  ALT 8 10  ALKPHOS 141* 182*  BILITOT 0.3 0.4  PROT 6.0 7.3  ALBUMIN 2.2* 2.6*    Lab 02/24/12 1242  LIPASE 14  AMYLASE --    Lab 02/24/12 1243  AMMONIA 35   CBC:  Lab 02/25/12 0342 02/24/12 2000 02/24/12 1242  WBC 6.8 9.6 --  NEUTROABS 4.7 -- 7.6  HGB 9.2* 9.5* --  HCT 29.1* 30.0* --  MCV 78.2 77.5* --  PLT 241 241 --   Cardiac Enzymes:  Lab 02/25/12  1028 02/25/12 0343  CKTOTAL 9 14  CKMB 1.4 1.0  CKMBINDEX -- --  TROPONINI <0.30 <0.30   CBG:  Lab 02/24/12 1255  GLUCAP 108*   Coagulation:  Lab 02/24/12 1242  LABPROT 15.9*  INR 1.24   Urinalysis:  Lab 02/24/12 1314  COLORURINE YELLOW  LABSPEC 1.020  PHURINE 6.0  GLUCOSEU NEGATIVE  HGBUR LARGE*  BILIRUBINUR NEGATIVE  KETONESUR NEGATIVE  PROTEINUR 100*  UROBILINOGEN 0.2  NITRITE NEGATIVE  LEUKOCYTESUR LARGE*   Micro Results: Recent Results (from the past 240 hour(s))  CULTURE, BLOOD (ROUTINE X 2)     Status: Normal (Preliminary result)   Collection Time   02/24/12 12:40 PM      Component Value Range Status Comment   Specimen Description BLOOD ARM RIGHT   Final    Special Requests BOTTLES DRAWN AEROBIC ONLY 8CC   Final    Culture  Setup Time [id number]   Final    Culture     Final    Value:        BLOOD CULTURE RECEIVED NO GROWTH TO DATE CULTURE WILL BE HELD FOR 5 DAYS BEFORE ISSUING A FINAL NEGATIVE REPORT   Report Status PENDING   Incomplete  CULTURE, BLOOD (ROUTINE X 2)     Status: Normal (Preliminary result)   Collection Time   02/24/12 12:43 PM      Component Value Range Status Comment   Specimen Description BLOOD HAND LEFT   Final    Special Requests BOTTLES DRAWN AEROBIC AND ANAEROBIC 10CC   Final    Culture  Setup Time [id number]   Final    Culture     Final    Value:        BLOOD CULTURE RECEIVED NO GROWTH TO DATE CULTURE WILL BE HELD FOR 5 DAYS BEFORE ISSUING A FINAL NEGATIVE REPORT   Report Status PENDING   Incomplete   URINE CULTURE     Status: Normal   Collection Time   02/24/12  1:14 PM      Component Value Range Status Comment   Specimen Description URINE, CATHETERIZED   Final    Special Requests NONE   Final    Culture  Setup Time [id number]   Final    Colony Count NO GROWTH   Final    Culture NO GROWTH   Final    Report Status 02/25/2012 FINAL   Final   MRSA PCR SCREENING     Status: Normal   Collection Time   02/24/12  7:14 PM       Component Value Range Status Comment   MRSA by PCR NEGATIVE  NEGATIVE  Final   URINE CULTURE     Status: Normal   Collection Time   02/24/12  8:06 PM      Component Value Range Status Comment   Specimen Description URINE, CATHETERIZED   Final    Special Requests NONE   Final    Culture  Setup Time [id number]   Final    Colony Count >=100,000 COLONIES/ML   Final    Culture YEAST   Final    Report Status 02/26/2012 FINAL   Final   CULTURE, BLOOD (ROUTINE X 2)     Status: Normal (Preliminary result)   Collection Time   02/24/12  8:10 PM      Component Value Range Status Comment   Specimen Description BLOOD RIGHT HAND   Final    Special Requests BOTTLES DRAWN AEROBIC ONLY 10CC   Final    Culture  Setup Time [id number]   Final    Culture     Final    Value:        BLOOD CULTURE RECEIVED NO GROWTH TO DATE CULTURE WILL BE HELD FOR 5 DAYS BEFORE ISSUING A FINAL NEGATIVE REPORT   Report Status PENDING   Incomplete   CULTURE, BLOOD (ROUTINE X 2)     Status: Normal (Preliminary result)   Collection Time   02/24/12  8:13 PM      Component Value Range Status Comment   Specimen Description BLOOD LEFT HAND   Final    Special Requests BOTTLES DRAWN AEROBIC ONLY 10CC   Final    Culture  Setup Time [id number]   Final    Culture     Final    Value:        BLOOD CULTURE RECEIVED NO GROWTH TO DATE CULTURE WILL BE HELD FOR 5 DAYS BEFORE ISSUING A FINAL NEGATIVE REPORT   Report Status PENDING   Incomplete    Studies/Results: US Renal  02/28/2012  *RADIOLOGY REPORT*  Clinical Data: History of recurrent urinary tract infection. Neurogenic bladder.  Elevated BUN.  RENAL/URINARY TRACT ULTRASOUND COMPLETE  Comparison:  None.  Findings:  Right Kidney:  Measures 10.8 cm and appears normal without stone, mass or hydronephrosis.  Left Kidney:  Measures measures 11.1 cm.  A 1.0 cm cyst is noted. No stone or hydronephrosis.  Bladder:  Debris is present within the urinary bladder.  A Foley catheter is in place.   Other findings:  Visualized liver parenchyma is markedly echogenic. The spleen measures 12.8 x 5.2 x 14.8 cm for a volume of 513.0 cm cubed.  IMPRESSION:  1.  Negative for hydronephrosis or stone. 2.  Debris is present within the urinary bladder. 3.  Fatty infiltration of liver. 4.  Splenomegaly.  Original Report Authenticated By: Bernadene Bell. Maricela Curet, M.D.   Medications: I have reviewed the patient's current medications. Scheduled Meds:    . amitriptyline  100 mg Oral QHS  . collagenase   Topical Daily  . feeding supplement  237 mL Oral TID BM  . fluconazole  100 mg Oral Daily  . FLUoxetine  40 mg Oral Daily  . heparin  5,000 Units Subcutaneous Q8H  . levofloxacin (LEVAQUIN) IV  750 mg Intravenous Q24H  . mulitivitamin with minerals  1 tablet Oral Daily  . ondansetron      . oxybutynin  5 mg Oral BID  . potassium chloride  40 mEq Oral Q6H   Continuous Infusions:    . sodium chloride 20 mL/hr at 02/28/12 0500   PRN Meds:.acetaminophen, ondansetron (ZOFRAN) IV, ondansetron, oxyCODONE Assessment/Plan: UTI (lower urinary tract infection) - She is on diflucan 100 mg daily. She may need prophylactic course of diflucan while she has urinary catheter as this does appear to be recurrent candiuria. On exam today she still has the area of hardness in her lower abdomen however renal US was negative and are treating her UTI. This hardness is non-tender today and will continue to watch.  After speaking with Dr. [name] have decided to read about usefulness in supra-pubic catheter in preventing recurrent infections. If helpful will get urology out-pt appointment. Gave zofran yesterday and her nausea is slightly better today but her appetite is still diminished.    Fever - She has been afebrile for 24 hours. Blood cultures have been drawn at admission and were preliminary negative and repeat blood cultures are still pending.   PRESSURE ULCER HIP -  Will get wound care consult to care for her decubitus  ulcers on her backside. She states that there are 5. Heels are in protective holder to prevent ulcer.    Multiple sclerosis - Stable on her medications.    Normocytic anemia - Likely due to chronic disease and appears to be stable around 9.    Leukocytosis - Possibly due to infection. It did seem to be trending down.    Hypercalcemia - Appears to have resolved at this time but likely etiology would be dehydration causing concentration on admission.   Metabolic encephalopathy - Pt is alert and oriented but not cognitively intact. Will continue to watch and may be an aspect of depression involved.   Disposition - Did call Dr. [name] to discuss with him as she is a [facility] patient. She will likely need several more days here until her UTI starts to improve. Still getting zofran to see if we can improve her nausea and improve appetite. Plan to D/C back to SNF on Monday.   LOS: 5 days   Genella Mech 02/29/2012, 7:58 AM

## 2012-02-29 NOTE — Progress Notes (Signed)
Visited and reviewed data and issues with residents.  She is actually alert and humor is intact.  I do not know her baseline mental function.  Discussed urinary catheter and management of candida UTI. Agree with Dr. Lavonna Monarch notes and plans.

## 2012-03-01 ENCOUNTER — Inpatient Hospital Stay (HOSPITAL_COMMUNITY)

## 2012-03-01 LAB — BASIC METABOLIC PANEL
BUN: 9 mg/dL (ref 6–23)
Calcium: 10 mg/dL (ref 8.4–10.5)
Creatinine, Ser: 0.49 mg/dL — ABNORMAL LOW (ref 0.50–1.10)
GFR calc Af Amer: >90 mL/min (ref >90)
GFR calc non Af Amer: >90 mL/min (ref >90)
Glucose, Bld: 94 mg/dL (ref 70–99)

## 2012-03-01 LAB — CULTURE, BLOOD (ROUTINE X 2)
Culture  Setup Time: 201303032142
Culture: NO GROWTH

## 2012-03-01 LAB — CBC
Hemoglobin: 10 g/dL — ABNORMAL LOW (ref 12.0–15.0)
MCH: 23.6 pg — ABNORMAL LOW (ref 26.0–34.0)
MCHC: 31.2 g/dL (ref 30.0–36.0)
Platelets: 369 10*3/uL (ref 150–400)
RDW: 19.3 % — ABNORMAL HIGH (ref 11.5–15.5)

## 2012-03-01 LAB — GLUCOSE, CAPILLARY: Glucose-Capillary: 125 mg/dL — ABNORMAL HIGH (ref 70–99)

## 2012-03-01 MED ORDER — PIPERACILLIN-TAZOBACTAM 3.375 G IVPB
3.3750 g | Freq: Three times a day (TID) | INTRAVENOUS | Status: DC
  Administered 2012-03-02 – 2012-03-05 (×11): 3.375 g via INTRAVENOUS
  Filled 2012-03-01 (×13): qty 50

## 2012-03-01 MED ORDER — DOCUSATE SODIUM 100 MG PO CAPS
100.0000 mg | ORAL_CAPSULE | Freq: Two times a day (BID) | ORAL | Status: DC
  Administered 2012-03-01 – 2012-03-08 (×9): 100 mg via ORAL
  Filled 2012-03-01 (×15): qty 1

## 2012-03-01 MED ORDER — VANCOMYCIN HCL IN DEXTROSE 1-5 GM/200ML-% IV SOLN
1000.0000 mg | Freq: Two times a day (BID) | INTRAVENOUS | Status: DC
  Administered 2012-03-02 – 2012-03-04 (×6): 1000 mg via INTRAVENOUS
  Filled 2012-03-01 (×7): qty 200

## 2012-03-01 MED ORDER — POLYETHYLENE GLYCOL 3350 17 G PO PACK
17.0000 g | PACK | Freq: Every day | ORAL | Status: DC
  Administered 2012-03-01 – 2012-03-08 (×5): 17 g via ORAL
  Filled 2012-03-01 (×8): qty 1

## 2012-03-01 NOTE — Progress Notes (Signed)
Subjective: She reports some abdominal discomfort from constipation. Denies any dysuria, nausea or vomiting.   Objective: Vital signs in last 24 hours: Filed Vitals:   02/29/12 0608 02/29/12 1527 02/29/12 2053 03/01/12 0532  BP: 114/84 133/88 108/40 133/61  Pulse: 93 93 101 100  Temp: 97.3 F (36.3 C) 97.5 F (36.4 C) 97.6 F (36.4 C) 99.3 F (37.4 C)  TempSrc:    Oral  Resp: 18 18 18 18   Height:      Weight:      SpO2: 97% 95% 96% 95%   Weight change:   Intake/Output Summary (Last 24 hours) at 03/01/12 0704 Last data filed at 03/01/12 0535  Gross per 24 hour  Intake    880 ml  Output    750 ml  Net    130 ml   Physical Exam: General: resting in bed HEENT: PERRL, EOMI, no scleral icterus Cardiac: RRR, no rubs, murmurs or gallops Pulm: clear to auscultation bilaterally, moving normal volumes of air Abd: lower abdomen was fairly hard on the left side, decreased erythema and swelling around the catheter , mildly tender, nondistended, BS present Ext: warm and well perfused, mild pedal edema, heels are in protective holster to prevent bed sores Backside: There are multiple bed sores on her buttocks Neuro: alert and oriented X2, does not know date, moves all 4 extremities, able to follow 1-2 step commands  Lab Results: Basic Metabolic Panel:  Lab 02/29/12 6578 02/25/12 0342  NA 136 136  K 3.0* 3.5  CL 106 106  CO2 22 21  GLUCOSE 96 92  BUN 12 24*  CREATININE 0.58 0.92  CALCIUM 9.7 9.7  MG -- --  PHOS -- --   Liver Function Tests:  Lab 02/25/12 0342 02/24/12 1242  AST 21 23  ALT 8 10  ALKPHOS 141* 182*  BILITOT 0.3 0.4  PROT 6.0 7.3  ALBUMIN 2.2* 2.6*    Lab 02/24/12 1242  LIPASE 14  AMYLASE --    Lab 02/24/12 1243  AMMONIA 35   CBC:  Lab 02/25/12 0342 02/24/12 2000 02/24/12 1242  WBC 6.8 9.6 --  NEUTROABS 4.7 -- 7.6  HGB 9.2* 9.5* --  HCT 29.1* 30.0* --  MCV 78.2 77.5* --  PLT 241 241 --   Cardiac Enzymes:  Lab 02/25/12 1028 02/25/12 0343   CKTOTAL 9 14  CKMB 1.4 1.0  CKMBINDEX -- --  TROPONINI <0.30 <0.30   CBG:  Lab 02/24/12 1255  GLUCAP 108*   Coagulation:  Lab 02/24/12 1242  LABPROT 15.9*  INR 1.24   Urinalysis:  Lab 02/24/12 1314  COLORURINE YELLOW  LABSPEC 1.020  PHURINE 6.0  GLUCOSEU NEGATIVE  HGBUR LARGE*  BILIRUBINUR NEGATIVE  KETONESUR NEGATIVE  PROTEINUR 100*  UROBILINOGEN 0.2  NITRITE NEGATIVE  LEUKOCYTESUR LARGE*   Micro Results: Recent Results (from the past 240 hour(s))  CULTURE, BLOOD (ROUTINE X 2)     Status: Normal (Preliminary result)   Collection Time   02/24/12 12:40 PM      Component Value Range Status Comment   Specimen Description BLOOD ARM RIGHT   Final    Special Requests BOTTLES DRAWN AEROBIC ONLY 8CC   Final    Culture  Setup Time [id number]   Final    Culture     Final    Value:        BLOOD CULTURE RECEIVED NO GROWTH TO DATE CULTURE WILL BE HELD FOR 5 DAYS BEFORE ISSUING A FINAL NEGATIVE REPORT   Report  Status PENDING   Incomplete   CULTURE, BLOOD (ROUTINE X 2)     Status: Normal (Preliminary result)   Collection Time   02/24/12 12:43 PM      Component Value Range Status Comment   Specimen Description BLOOD HAND LEFT   Final    Special Requests BOTTLES DRAWN AEROBIC AND ANAEROBIC 10CC   Final    Culture  Setup Time [id number]   Final    Culture     Final    Value:        BLOOD CULTURE RECEIVED NO GROWTH TO DATE CULTURE WILL BE HELD FOR 5 DAYS BEFORE ISSUING A FINAL NEGATIVE REPORT   Report Status PENDING   Incomplete   URINE CULTURE     Status: Normal   Collection Time   02/24/12  1:14 PM      Component Value Range Status Comment   Specimen Description URINE, CATHETERIZED   Final    Special Requests NONE   Final    Culture  Setup Time [id number]   Final    Colony Count NO GROWTH   Final    Culture NO GROWTH   Final    Report Status 02/25/2012 FINAL   Final   MRSA PCR SCREENING     Status: Normal   Collection Time   02/24/12  7:14 PM      Component Value  Range Status Comment   MRSA by PCR NEGATIVE  NEGATIVE  Final   URINE CULTURE     Status: Normal   Collection Time   02/24/12  8:06 PM      Component Value Range Status Comment   Specimen Description URINE, CATHETERIZED   Final    Special Requests NONE   Final    Culture  Setup Time [id number]   Final    Colony Count >=100,000 COLONIES/ML   Final    Culture YEAST   Final    Report Status 02/26/2012 FINAL   Final   CULTURE, BLOOD (ROUTINE X 2)     Status: Normal (Preliminary result)   Collection Time   02/24/12  8:10 PM      Component Value Range Status Comment   Specimen Description BLOOD RIGHT HAND   Final    Special Requests BOTTLES DRAWN AEROBIC ONLY 10CC   Final    Culture  Setup Time [id number]   Final    Culture     Final    Value:        BLOOD CULTURE RECEIVED NO GROWTH TO DATE CULTURE WILL BE HELD FOR 5 DAYS BEFORE ISSUING A FINAL NEGATIVE REPORT   Report Status PENDING   Incomplete   CULTURE, BLOOD (ROUTINE X 2)     Status: Normal (Preliminary result)   Collection Time   02/24/12  8:13 PM      Component Value Range Status Comment   Specimen Description BLOOD LEFT HAND   Final    Special Requests BOTTLES DRAWN AEROBIC ONLY 10CC   Final    Culture  Setup Time [id number]   Final    Culture     Final    Value:        BLOOD CULTURE RECEIVED NO GROWTH TO DATE CULTURE WILL BE HELD FOR 5 DAYS BEFORE ISSUING A FINAL NEGATIVE REPORT   Report Status PENDING   Incomplete   CULTURE, BLOOD (ROUTINE X 2)     Status: Normal (Preliminary result)   Collection Time   02/27/12  7:33 PM  Component Value Range Status Comment   Specimen Description BLOOD RIGHT HAND   Final    Special Requests BOTTLES DRAWN AEROBIC AND ANAEROBIC 10CC   Final    Culture  Setup Time [id number]   Final    Culture     Final    Value:        BLOOD CULTURE RECEIVED NO GROWTH TO DATE CULTURE WILL BE HELD FOR 5 DAYS BEFORE ISSUING A FINAL NEGATIVE REPORT   Report Status PENDING   Incomplete   CULTURE, BLOOD  (ROUTINE X 2)     Status: Normal (Preliminary result)   Collection Time   02/27/12  7:47 PM      Component Value Range Status Comment   Specimen Description BLOOD LEFT HAND   Final    Special Requests BOTTLES DRAWN AEROBIC AND ANAEROBIC 10CC   Final    Culture  Setup Time [id number]   Final    Culture     Final    Value:        BLOOD CULTURE RECEIVED NO GROWTH TO DATE CULTURE WILL BE HELD FOR 5 DAYS BEFORE ISSUING A FINAL NEGATIVE REPORT   Report Status PENDING   Incomplete    Studies/Results: US Renal  02/28/2012  *RADIOLOGY REPORT*  Clinical Data: History of recurrent urinary tract infection. Neurogenic bladder.  Elevated BUN.  RENAL/URINARY TRACT ULTRASOUND COMPLETE  Comparison:  None.  Findings:  Right Kidney:  Measures 10.8 cm and appears normal without stone, mass or hydronephrosis.  Left Kidney:  Measures measures 11.1 cm.  A 1.0 cm cyst is noted. No stone or hydronephrosis.  Bladder:  Debris is present within the urinary bladder.  A Foley catheter is in place.  Other findings:  Visualized liver parenchyma is markedly echogenic. The spleen measures 12.8 x 5.2 x 14.8 cm for a volume of 513.0 cm cubed.  IMPRESSION:  1.  Negative for hydronephrosis or stone. 2.  Debris is present within the urinary bladder. 3.  Fatty infiltration of liver. 4.  Splenomegaly.  Original Report Authenticated By: Bernadene Bell. Maricela Curet, M.D.   Medications: I have reviewed the patient's current medications. Scheduled Meds:    . amitriptyline  100 mg Oral QHS  . collagenase   Topical Daily  . feeding supplement  237 mL Oral TID BM  . fluconazole  100 mg Oral Daily  . FLUoxetine  40 mg Oral Daily  . heparin  5,000 Units Subcutaneous Q8H  . mulitivitamin with minerals  1 tablet Oral Daily  . oxybutynin  5 mg Oral BID  . potassium chloride  40 mEq Oral Q6H  . DISCONTD: levofloxacin (LEVAQUIN) IV  750 mg Intravenous Q24H  . DISCONTD: levofloxacin  750 mg Oral QHS   Continuous Infusions:    . sodium chloride 20  mL/hr at 02/29/12 1800   PRN Meds:.acetaminophen, ondansetron (ZOFRAN) IV, ondansetron, oxyCODONE  Assessment/Plan: 1. UTI (lower urinary tract infection) - Indwelling catheter in place but no urinary leakage. She denies any dysuria, urgency or frequency. Afebrile for last 24 hours. -Continue diflucan -Urology referral as outpatient.  2. Constipation: She reports some abdominal discomfort. On abdominal exam, she is hard on the left lower quadrant which could be secondary to fecal retention. She has not moved her bowels for last few days. - Start her on colace and mira lax.  3. Possible HCAP: Resolved. Denies any cough. Has been afebrile . No leucocytosis. Status post treatment for possible pneumonia with Levaquin for 5 days.( d/c on 3/8).  4. PRESSURE ULCER HIP -  Wound care consult pending.Her dressing change was done this AM.     - Continue PRN  dressing change.   5.Multiple sclerosis - Stable on her medications.   6.Normocytic anemia - Likely due to chronic disease and appears to be stable around 9.   7.Metabolic encephalopathy - Resolved.Pt is alert and oriented but not cognitively intact. Will continue to watch and may be an aspect of depression involved.   8. Disposition - Patient is improving.  Plan to D/C back to [facility] on Monday.I spoke with her daughter to give her the updates.   LOS: 6 days   Caylan Chenard 03/01/2012, 7:04 AM

## 2012-03-01 NOTE — Progress Notes (Signed)
ANTIBIOTIC CONSULT NOTE - INITIAL  Pharmacy Consult for  Vancomycin and Zosyn Indication:  HCAP  No Known Allergies  Patient Measurements: Height: 5\' 8"  (172.7 cm) Weight: 169 lb 15.6 oz (77.1 kg) IBW/kg (Calculated) : 63.9    Vital Signs: Temp: 101.2 F (38.4 C) (03/09 2243) Temp src: Oral (03/09 2130) BP: 117/74 mmHg (03/09 2243) Pulse Rate: 122  (03/09 2243) Intake/Output from previous day: 03/08 0701 - 03/09 0700 In: 880 [I.V.:880] Out: 750 [Urine:750] Intake/Output from this shift:    Labs:  Basename 03/01/12 0630 02/29/12 0510  WBC 6.7 --  HGB 10.0* --  PLT 369 --  LABCREA -- --  CREATININE 0.49* 0.58   Estimated Creatinine Clearance: 71.5 ml/min (by C-G formula based on Cr of 0.49).   Microbiology: 3/3 - Urine - > 100,000 colonies/ml yeast 3/3 - Blood - No Growth 3/6 - Blood - No Growth to date   Medical History: Past Medical History  Diagnosis Date  . Multiple sclerosis   . GERD (gastroesophageal reflux disease)   . Depression   . Recurrent UTI   . Recurrent pneumonia   . Chronic constipation   . Diverticulosis     Medications:  Anti-infectives     Start     Dose/Rate Route Frequency Ordered Stop   02/29/12 2200   levofloxacin (LEVAQUIN) tablet 750 mg  Status:  Discontinued        750 mg Oral Daily at bedtime 02/29/12 1020 02/29/12 1814   02/28/12 1000   fluconazole (DIFLUCAN) tablet 100 mg        100 mg Oral Daily 02/27/12 1554     02/27/12 1630   fluconazole (DIFLUCAN) tablet 200 mg        200 mg Oral  Once 02/27/12 1554 02/27/12 1812   02/27/12 1600   fluconazole (DIFLUCAN) tablet 200 mg  Status:  Discontinued        200 mg Oral Daily 02/27/12 1553 02/27/12 1554   02/26/12 2000   Levofloxacin (LEVAQUIN) IVPB 750 mg  Status:  Discontinued        750 mg 100 mL/hr over 90 Minutes Intravenous Every 48 hours 02/24/12 1959 02/25/12 1105   02/25/12 2240   Levofloxacin (LEVAQUIN) IVPB 750 mg  Status:  Discontinued        750 mg 100  mL/hr over 90 Minutes Intravenous Every 24 hours 02/25/12 1105 02/29/12 1019   02/25/12 0600   vancomycin (VANCOCIN) 1,250 mg in sodium chloride 0.9 % 250 mL IVPB  Status:  Discontinued        1,250 mg 166.7 mL/hr over 90 Minutes Intravenous Every 24 hours 02/24/12 1959 02/25/12 0725   02/24/12 2200   ceFEPIme (MAXIPIME) 1 g in dextrose 5 % 50 mL IVPB  Status:  Discontinued        1 g 100 mL/hr over 30 Minutes Intravenous 3 times per day 02/24/12 1928 02/24/12 1959   02/24/12 2100   ceFEPIme (MAXIPIME) 2 g in dextrose 5 % 50 mL IVPB  Status:  Discontinued        2 g 100 mL/hr over 30 Minutes Intravenous Every 24 hours 02/24/12 1959 02/25/12 0725   02/24/12 1930   Levofloxacin (LEVAQUIN) IVPB 750 mg  Status:  Discontinued        750 mg 100 mL/hr over 90 Minutes Intravenous Every 24 hours 02/24/12 1928 02/24/12 1959   02/24/12 1545   ciprofloxacin (CIPRO) IVPB 400 mg  Status:  Discontinued  400 mg 200 mL/hr over 60 Minutes Intravenous Every 12 hours 02/24/12 1544 02/24/12 1959   02/24/12 1230  piperacillin-tazobactam (ZOSYN) IVPB 3.375 g       3.375 g 100 mL/hr over 30 Minutes Intravenous  Once 02/24/12 1225 02/24/12 1313   02/24/12 1230   vancomycin (VANCOCIN) IVPB 1000 mg/200 mL premix        1,000 mg 200 mL/hr over 60 Minutes Intravenous  Once 02/24/12 1225 02/24/12 1429         Assessment: 71 yo female admitted with confusion.  She has history of Multiple Sclerosis with recurrent UTI's, pneumonia, decubitus.  She also has an indwelling catheter.  She has had multiple IV antibiotics including Vancomycin/Zosyn/Cefepime/Levofloxacin and Fluconazole.  She is now spiking temp. To 101.2 without leukocytosis.  Plan is to begin emperic Vancomycin and Zosyn therapy to cover for HCAP.  Goal of Therapy:  Vancomycin trough 15-20 mcg/ml  Plan:  Vancomycin 1000 mg IV every 12 hours Will check steady state levels and adjust Zosyn 3.375 gm IV every 8 hours. F/U culture data and  de-escalate as appropriate.  Nadara Mustard, PharmD., MS Clinical Pharmacist Pager:  [phone] 03/01/2012,11:01 PM

## 2012-03-01 NOTE — Progress Notes (Signed)
Subjective: Nurse called to inform that HR 120's and RR 30's, Tmax of 101.2.  On arrival, patient appears lethargic, flushed, complaining of chest pain-pressure like om quality, and muscle cramping in legs/thigh.    Objective: Vital signs in last 24 hours: Filed Vitals:   03/01/12 1501 03/01/12 2130 03/01/12 2241 03/01/12 2243  BP: 128/57 107/70  117/74  Pulse: 100 121 121 122  Temp: 99.3 F (37.4 C) 98.4 F (36.9 C)  101.2 F (38.4 C)  TempSrc: Oral Oral    Resp: 18 21 30 30   Height:      Weight:      SpO2: 95% 90% 94% 91%   Weight change:   Intake/Output Summary (Last 24 hours) at 03/01/12 2300 Last data filed at 03/01/12 1502  Gross per 24 hour  Intake    698 ml  Output    200 ml  Net    498 ml   Physical Exam: GEN: moderate resp distress, lethargic but answered questions appropriately Heart: tachycardic, regular rhythm, no m/g/r Lungs: moderate resp distress, + accessory muscle use, decrease air movement at bases Abd: soft, tender to palpation of left quadrant, hypoactive BS Neuro: nonfocal, A& O x 2  Lab Results: Basic Metabolic Panel:  Lab 03/01/12 4098 02/29/12 0510  NA 137 136  K 4.9 3.0*  CL 106 106  CO2 24 22  GLUCOSE 94 96  BUN 9 12  CREATININE 0.49* 0.58  CALCIUM 10.0 9.7  MG -- --  PHOS -- --   Liver Function Tests:  Lab 02/25/12 0342 02/24/12 1242  AST 21 23  ALT 8 10  ALKPHOS 141* 182*  BILITOT 0.3 0.4  PROT 6.0 7.3  ALBUMIN 2.2* 2.6*    Lab 02/24/12 1242  LIPASE 14  AMYLASE --    Lab 02/24/12 1243  AMMONIA 35   CBC:  Lab 03/01/12 0630 02/25/12 0342 02/24/12 1242  WBC 6.7 6.8 --  NEUTROABS -- 4.7 7.6  HGB 10.0* 9.2* --  HCT 32.1* 29.1* --  MCV 75.9* 78.2 --  PLT 369 241 --   Cardiac Enzymes:  Lab 02/25/12 1028 02/25/12 0343  CKTOTAL 9 14  CKMB 1.4 1.0  CKMBINDEX -- --  TROPONINI <0.30 <0.30   CBG:  Lab 03/01/12 2242 02/24/12 1255  GLUCAP 125* 108*   Coagulation:  Lab 02/24/12 1242  LABPROT 15.9*  INR 1.24     Urinalysis:  Lab 02/24/12 1314  COLORURINE YELLOW  LABSPEC 1.020  PHURINE 6.0  GLUCOSEU NEGATIVE  HGBUR LARGE*  BILIRUBINUR NEGATIVE  KETONESUR NEGATIVE  PROTEINUR 100*  UROBILINOGEN 0.2  NITRITE NEGATIVE  LEUKOCYTESUR LARGE*    Micro Results: Recent Results (from the past 240 hour(s))  CULTURE, BLOOD (ROUTINE X 2)     Status: Normal   Collection Time   02/24/12 12:40 PM      Component Value Range Status Comment   Specimen Description BLOOD ARM RIGHT   Final    Special Requests BOTTLES DRAWN AEROBIC ONLY [facility]   Final    Culture  Setup Time [id number]   Final    Culture NO GROWTH 5 DAYS   Final    Report Status 03/01/2012 FINAL   Final   CULTURE, BLOOD (ROUTINE X 2)     Status: Normal   Collection Time   02/24/12 12:43 PM      Component Value Range Status Comment   Specimen Description BLOOD HAND LEFT   Final    Special Requests BOTTLES DRAWN AEROBIC AND ANAEROBIC 10CC  Final    Culture  Setup Time [id number]   Final    Culture NO GROWTH 5 DAYS   Final    Report Status 03/01/2012 FINAL   Final   URINE CULTURE     Status: Normal   Collection Time   02/24/12  1:14 PM      Component Value Range Status Comment   Specimen Description URINE, CATHETERIZED   Final    Special Requests NONE   Final    Culture  Setup Time [id number]   Final    Colony Count NO GROWTH   Final    Culture NO GROWTH   Final    Report Status 02/25/2012 FINAL   Final   MRSA PCR SCREENING     Status: Normal   Collection Time   02/24/12  7:14 PM      Component Value Range Status Comment   MRSA by PCR NEGATIVE  NEGATIVE  Final   URINE CULTURE     Status: Normal   Collection Time   02/24/12  8:06 PM      Component Value Range Status Comment   Specimen Description URINE, CATHETERIZED   Final    Special Requests NONE   Final    Culture  Setup Time [id number]   Final    Colony Count >=100,000 COLONIES/ML   Final    Culture YEAST   Final    Report Status 02/26/2012 FINAL   Final   CULTURE, BLOOD  (ROUTINE X 2)     Status: Normal (Preliminary result)   Collection Time   02/24/12  8:10 PM      Component Value Range Status Comment   Specimen Description BLOOD RIGHT HAND   Final    Special Requests BOTTLES DRAWN AEROBIC ONLY 10CC   Final    Culture  Setup Time [id number]   Final    Culture     Final    Value:        BLOOD CULTURE RECEIVED NO GROWTH TO DATE CULTURE WILL BE HELD FOR 5 DAYS BEFORE ISSUING A FINAL NEGATIVE REPORT   Report Status PENDING   Incomplete   CULTURE, BLOOD (ROUTINE X 2)     Status: Normal (Preliminary result)   Collection Time   02/24/12  8:13 PM      Component Value Range Status Comment   Specimen Description BLOOD LEFT HAND   Final    Special Requests BOTTLES DRAWN AEROBIC ONLY 10CC   Final    Culture  Setup Time [id number]   Final    Culture     Final    Value:        BLOOD CULTURE RECEIVED NO GROWTH TO DATE CULTURE WILL BE HELD FOR 5 DAYS BEFORE ISSUING A FINAL NEGATIVE REPORT   Report Status PENDING   Incomplete   CULTURE, BLOOD (ROUTINE X 2)     Status: Normal (Preliminary result)   Collection Time   02/27/12  7:33 PM      Component Value Range Status Comment   Specimen Description BLOOD RIGHT HAND   Final    Special Requests BOTTLES DRAWN AEROBIC AND ANAEROBIC 10CC   Final    Culture  Setup Time [id number]   Final    Culture     Final    Value:        BLOOD CULTURE RECEIVED NO GROWTH TO DATE CULTURE WILL BE HELD FOR 5 DAYS BEFORE ISSUING A FINAL NEGATIVE REPORT   Report Status PENDING  Incomplete   CULTURE, BLOOD (ROUTINE X 2)     Status: Normal (Preliminary result)   Collection Time   02/27/12  7:47 PM      Component Value Range Status Comment   Specimen Description BLOOD LEFT HAND   Final    Special Requests BOTTLES DRAWN AEROBIC AND ANAEROBIC 10CC   Final    Culture  Setup Time [id number]   Final    Culture     Final    Value:        BLOOD CULTURE RECEIVED NO GROWTH TO DATE CULTURE WILL BE HELD FOR 5 DAYS BEFORE ISSUING A FINAL NEGATIVE  REPORT   Report Status PENDING   Incomplete   Medications:  Scheduled Meds:   . amitriptyline  100 mg Oral QHS  . collagenase   Topical Daily  . docusate sodium  100 mg Oral BID  . feeding supplement  237 mL Oral TID BM  . fluconazole  100 mg Oral Daily  . FLUoxetine  40 mg Oral Daily  . heparin  5,000 Units Subcutaneous Q8H  . mulitivitamin with minerals  1 tablet Oral Daily  . oxybutynin  5 mg Oral BID  . polyethylene glycol  17 g Oral Daily   Continuous Infusions:   . sodium chloride 20 mL/hr at 02/29/12 1800   PRN Meds:.acetaminophen, ondansetron (ZOFRAN) IV, ondansetron, oxyCODONE Assessment/Plan: 1. Fever/tachycardia: Likely 2/2 HCAP.  She was treated with Levaquin x 5 days and now with chest discomfort, Tmax 101, and tachypnea.  Blood culture from 3/3 prelim showed no growth to date.  She did have urine culture positive for yeast which is likely 2/2 chronic foley catheter and is being treated with Diflucan.   -Will get stat chest Xray -Get 12 lead EKG- did not show any ST or T wave changes when compared to EKG on 3/6.  Rate 119, sinus tach -Get 1 set of CEs to rule out ACS -Get D-dimer to rule out since she is acute SOB with tachycardia, in addition she is immobile and hx of hip fracture -Stat BMP, CBC -Will repeat blood culture, urine culture -Will empirically treat with Vancomycin and Zosyn for HCAP    LOS: 6 days   Sejal Cofield 03/01/2012, 11:00 PM

## 2012-03-02 ENCOUNTER — Other Ambulatory Visit: Payer: Self-pay

## 2012-03-02 ENCOUNTER — Inpatient Hospital Stay (HOSPITAL_COMMUNITY)

## 2012-03-02 LAB — CBC
HCT: 28.3 % — ABNORMAL LOW (ref 36.0–46.0)
HCT: 29.4 % — ABNORMAL LOW (ref 36.0–46.0)
MCH: 23.4 pg — ABNORMAL LOW (ref 26.0–34.0)
MCHC: 31.6 g/dL (ref 30.0–36.0)
MCV: 75.3 fL — ABNORMAL LOW (ref 78.0–100.0)
MCV: 75.2 fL — ABNORMAL LOW (ref 78.0–100.0)
Platelets: 502 10*3/uL — ABNORMAL HIGH (ref 150–400)
Platelets: 369 10*3/uL (ref 150–400)
Platelets: 236 10*3/uL (ref 150–400)
RBC: 4.26 MIL/uL (ref 3.87–5.11)
RDW: 19.4 % — ABNORMAL HIGH (ref 11.5–15.5)
RDW: 19.5 % — ABNORMAL HIGH (ref 11.5–15.5)
WBC: 23.1 10*3/uL — ABNORMAL HIGH (ref 4.0–10.5)
WBC: 13.2 10*3/uL — ABNORMAL HIGH (ref 4.0–10.5)

## 2012-03-02 LAB — CULTURE, BLOOD (ROUTINE X 2)
Culture  Setup Time: 201303040351
Culture  Setup Time: 201303040351
Culture: NO GROWTH
Culture: NO GROWTH

## 2012-03-02 LAB — BASIC METABOLIC PANEL
BUN: 27 mg/dL — ABNORMAL HIGH (ref 6–23)
CO2: 23 mEq/L (ref 19–32)
CO2: 26 mEq/L (ref 19–32)
Calcium: 10.8 mg/dL — ABNORMAL HIGH (ref 8.4–10.5)
Calcium: 10.5 mg/dL (ref 8.4–10.5)
Creatinine, Ser: 0.84 mg/dL (ref 0.50–1.10)
Creatinine, Ser: 0.8 mg/dL (ref 0.50–1.10)
GFR calc Af Amer: 85 mL/min — ABNORMAL LOW (ref >90)
GFR calc non Af Amer: 69 mL/min — ABNORMAL LOW (ref >90)
Sodium: 137 mEq/L (ref 135–145)

## 2012-03-02 LAB — MAGNESIUM: Magnesium: 1.9 mg/dL (ref 1.5–2.5)

## 2012-03-02 LAB — CARDIAC PANEL(CRET KIN+CKTOT+MB+TROPI): Total CK: 8 U/L (ref 7–177)

## 2012-03-02 LAB — D-DIMER, QUANTITATIVE: D-Dimer, Quant: 0.92 ug/mL-FEU — ABNORMAL HIGH (ref 0.00–0.48)

## 2012-03-02 MED ORDER — ACETAMINOPHEN 650 MG RE SUPP
650.0000 mg | RECTAL | Status: DC | PRN
  Administered 2012-03-02 – 2012-03-04 (×2): 650 mg via RECTAL
  Filled 2012-03-02: qty 1

## 2012-03-02 MED ORDER — IOHEXOL 350 MG/ML SOLN
74.0000 mL | Freq: Once | INTRAVENOUS | Status: AC | PRN
  Administered 2012-03-02: 74 mL via INTRAVENOUS

## 2012-03-02 MED ORDER — SODIUM CHLORIDE 0.9 % IV SOLN
INTRAVENOUS | Status: DC
  Administered 2012-03-02 – 2012-03-08 (×12): via INTRAVENOUS

## 2012-03-02 MED ORDER — SUCRALFATE 1 GM/10ML PO SUSP
1.0000 g | Freq: Three times a day (TID) | ORAL | Status: DC
  Administered 2012-03-02 – 2012-03-08 (×12): 1 g via ORAL
  Filled 2012-03-02 (×28): qty 10

## 2012-03-02 NOTE — Progress Notes (Signed)
[name] [name] 03/02/2012 10:23 AM  New results noted including CBC, BMET, CT chest, abdominal x-ray.  No evidence of PE or HCAP. May have chronic aspiration although given poor PO intake over the last few days doubt this is causing all of her clinical worsening. Will continue to await urine cultures and leave vanc/zosyn until that time. X-ray shows no evidence for obstruction so will continue to use bowel agents to help her move her bowels. Will stop by and check on clinical status later in the day and start IV fluids for some dehydration. Will recheck CBC later this afternoon given recent episodes of hematemesis and drop in Hg. If this persists may get GI consult tomorrow. Otherwise continue current treatment.

## 2012-03-02 NOTE — Progress Notes (Signed)
Called daughter, Lars Mage, to update on patient's condition.

## 2012-03-02 NOTE — Progress Notes (Signed)
Subjective: The patient is asleep and appears to be uncomfortable when I enter. She wakes up briefly and says that she is not good today but can't further clarify. She says that she is having pain but not sure where. Spoke with daughter on Friday but will call later today to update her on the changes. This decreased responsiveness happened last night and gross puss was coming out of the catheter. She had vomited some blood yesterday. Breathing worsened slightly last night as well. Has not moved her bowel since 3/6. Did have fever last night.   Objective: Vital signs in last 24 hours: Filed Vitals:   03/01/12 2243 03/02/12 0058 03/02/12 0551 03/02/12 0741  BP: 117/74  106/51   Pulse: 122 116 106   Temp: 101.2 F (38.4 C) 99.2 F (37.3 C) 99.2 F (37.3 C)   TempSrc:  Oral Oral   Resp: 30  25 28   Height:      Weight:      SpO2: 91% 97% 98%    Weight change:   Intake/Output Summary (Last 24 hours) at 03/02/12 1023 Last data filed at 03/02/12 0720  Gross per 24 hour  Intake    568 ml  Output    200 ml  Net    368 ml   Physical Exam: General: sleeping when I enter, tossing and turning, awakens and falls back asleep rapidly, not able to clearly answer questions HEENT: localizes me with her eyes when I talk to her Cardiac: S 1 S 2 heard, no overt murmurs heard Pulm: good air movement heard bilaterally, perhaps decreased at the bases.  Abd: lower abdomen was fairly hard, tender to the touch, very hypoactive BS Ext: warm and well perfused, mild pedal edema, heels are in protective holster to prevent bed sores Backside: There are multiple bed sores on her buttocks Neuro: hard to determine orientation as she was not that responsive, moving arms and writhing in bed  Lab Results: Basic Metabolic Panel:  Lab 03/02/12 4540 03/02/12 0002  NA 136 137  K 4.7 5.3*  CL 106 104  CO2 26 23  GLUCOSE 138* 111*  BUN 27* 18  CREATININE 0.80 0.84  CALCIUM 10.5 10.8*  MG -- 1.9  PHOS -- --    CBC:  Lab 03/02/12 0900 03/02/12 0002 02/25/12 0342 02/24/12 1242  WBC 17.8* 23.1* -- --  NEUTROABS -- -- 4.7 7.6  HGB 8.8* 10.2* -- --  HCT 28.3* 32.0* -- --  MCV 75.3* 75.1* -- --  PLT 369 502* -- --   Cardiac Enzymes:  Lab 03/01/12 2330 02/25/12 1028 02/25/12 0343  CKTOTAL 8 9 14   CKMB 0.9 1.4 1.0  CKMBINDEX -- -- --  TROPONINI <0.30 <0.30 <0.30   CBG:  Lab 03/01/12 2242 02/24/12 1255  GLUCAP 125* 108*   Urinalysis:  Lab 02/24/12 1314  COLORURINE YELLOW  LABSPEC 1.020  PHURINE 6.0  GLUCOSEU NEGATIVE  HGBUR LARGE*  BILIRUBINUR NEGATIVE  KETONESUR NEGATIVE  PROTEINUR 100*  UROBILINOGEN 0.2  NITRITE NEGATIVE  LEUKOCYTESUR LARGE*   Micro Results: Recent Results (from the past 240 hour(s))  CULTURE, BLOOD (ROUTINE X 2)     Status: Normal   Collection Time   02/24/12 12:40 PM      Component Value Range Status Comment   Specimen Description BLOOD ARM RIGHT   Final    Special Requests BOTTLES DRAWN AEROBIC ONLY [facility]   Final    Culture  Setup Time [id number]   Final    Culture  NO GROWTH 5 DAYS   Final    Report Status 03/01/2012 FINAL   Final   CULTURE, BLOOD (ROUTINE X 2)     Status: Normal   Collection Time   02/24/12 12:43 PM      Component Value Range Status Comment   Specimen Description BLOOD HAND LEFT   Final    Special Requests BOTTLES DRAWN AEROBIC AND ANAEROBIC 10CC   Final    Culture  Setup Time [id number]   Final    Culture NO GROWTH 5 DAYS   Final    Report Status 03/01/2012 FINAL   Final   URINE CULTURE     Status: Normal   Collection Time   02/24/12  1:14 PM      Component Value Range Status Comment   Specimen Description URINE, CATHETERIZED   Final    Special Requests NONE   Final    Culture  Setup Time [id number]   Final    Colony Count NO GROWTH   Final    Culture NO GROWTH   Final    Report Status 02/25/2012 FINAL   Final   MRSA PCR SCREENING     Status: Normal   Collection Time   02/24/12  7:14 PM      Component Value Range Status  Comment   MRSA by PCR NEGATIVE  NEGATIVE  Final   URINE CULTURE     Status: Normal   Collection Time   02/24/12  8:06 PM      Component Value Range Status Comment   Specimen Description URINE, CATHETERIZED   Final    Special Requests NONE   Final    Culture  Setup Time [id number]   Final    Colony Count >=100,000 COLONIES/ML   Final    Culture YEAST   Final    Report Status 02/26/2012 FINAL   Final   CULTURE, BLOOD (ROUTINE X 2)     Status: Normal   Collection Time   02/24/12  8:10 PM      Component Value Range Status Comment   Specimen Description BLOOD RIGHT HAND   Final    Special Requests BOTTLES DRAWN AEROBIC ONLY 10CC   Final    Culture  Setup Time [id number]   Final    Culture NO GROWTH 5 DAYS   Final    Report Status 03/02/2012 FINAL   Final   CULTURE, BLOOD (ROUTINE X 2)     Status: Normal   Collection Time   02/24/12  8:13 PM      Component Value Range Status Comment   Specimen Description BLOOD LEFT HAND   Final    Special Requests BOTTLES DRAWN AEROBIC ONLY 10CC   Final    Culture  Setup Time [id number]   Final    Culture NO GROWTH 5 DAYS   Final    Report Status 03/02/2012 FINAL   Final   CULTURE, BLOOD (ROUTINE X 2)     Status: Normal (Preliminary result)   Collection Time   02/27/12  7:33 PM      Component Value Range Status Comment   Specimen Description BLOOD RIGHT HAND   Final    Special Requests BOTTLES DRAWN AEROBIC AND ANAEROBIC 10CC   Final    Culture  Setup Time [id number]   Final    Culture     Final    Value:        BLOOD CULTURE RECEIVED NO GROWTH TO DATE CULTURE WILL BE HELD FOR  5 DAYS BEFORE ISSUING A FINAL NEGATIVE REPORT   Report Status PENDING   Incomplete   CULTURE, BLOOD (ROUTINE X 2)     Status: Normal (Preliminary result)   Collection Time   02/27/12  7:47 PM      Component Value Range Status Comment   Specimen Description BLOOD LEFT HAND   Final    Special Requests BOTTLES DRAWN AEROBIC AND ANAEROBIC 10CC   Final    Culture  Setup Time  [id number]   Final    Culture     Final    Value:        BLOOD CULTURE RECEIVED NO GROWTH TO DATE CULTURE WILL BE HELD FOR 5 DAYS BEFORE ISSUING A FINAL NEGATIVE REPORT   Report Status PENDING   Incomplete    Studies/Results: Dg Abd 1 View  03/02/2012  *RADIOLOGY REPORT*  Clinical Data: Abdominal pain, nausea and vomiting.  ABDOMEN - 1 VIEW  Comparison: 02/18/2012  Findings: There is moderate stool interspersed with barium in the proximal colon.  The patient did ingest barium for a swallowing function study on 02/26/2012.  The rest of the colon shows no significant stool burden.  There is no evidence of bowel obstruction or ileus.  Stable calcification in the left hemipelvis likely relates to degenerative calcification in the uterus or adnexal region.  IMPRESSION: No overt bowel obstruction.  There is a moderate amount of fecal material in the ascending colon which is mixed with previously ingested barium.  Original Report Authenticated By: Reola Calkins, M.D.   Ct Angio Chest W/cm &/or Wo Cm  03/02/2012  *RADIOLOGY REPORT*  Clinical Data: Back pain.  Elevated D-dimer.  CT ANGIOGRAPHY CHEST  Technique:  Multidetector CT imaging of the chest using the standard protocol during bolus administration of intravenous contrast. Multiplanar reconstructed images including MIPs were obtained and reviewed to evaluate the vascular anatomy.  Contrast: 74mL OMNIPAQUE IOHEXOL 350 MG/ML IV SOLN  Comparison: Multiple priors , most recentlyCT of the thorax 02/24/2012  Findings:  Mediastinum: Although this study is slightly limited by respiratory motion, there is no evidence of central, lobar or segmental sized pulmonary embolism (secondary to respiratory motion, some areas of the subsegmental pulmonary arteries are obscured by motion such that smaller subsegmental sized pulmonary embolism cannot be entirely excluded). Heart size is borderline enlarged. There is no significant pericardial fluid, thickening or  pericardial calcification. There is atherosclerosis of the thoracic aorta, the great vessels of the mediastinum and the coronary arteries, including calcified atherosclerotic plaque in the left main and left circumflex coronary arteries. No pathologically enlarged mediastinal or hilar lymph nodes. The esophagus is diffusely patulous and fluid - filled with a thickened wall.  Lungs/Pleura: There continue to be some patchy areas of interstitial prominence and small focal regions of ground-glass attenuation air space disease scattered throughout the lungs bilaterally (most pronounced in a dependent distribution), similar compared to prior examination from 02/24/2012.  Dependent atelectasis and/or scarring is noted in the lung bases bilaterally (unchanged).  No definite pleural effusions.  No definite suspicious appearing pulmonary nodules or masses are identified (please note that accurate assessment for small pulmonary nodules is limited by patient respiratory motion).  Upper Abdomen: Unremarkable.  Musculoskeletal: There are no aggressive appearing lytic or blastic lesions noted in the visualized portions of the skeleton.  IMPRESSION: 1.  Although the study is slightly limited by respiratory motion, there is no evidence to suggest clinically relevant central, lobar or segmental sized pulmonary embolism.  Smaller subsegmental sized  pulmonary embolism cannot be entirely excluded. 2.  Persistence of patchy areas of interstitial prominence and ground-glass attenuation air space disease in the lungs bilaterally, predominately in a dependent pattern.  Given the patulous and fluid-filled esophagus, these findings may represent sequela of repeated aspiration.  Clinical correlation for signs and symptoms of aspiration are recommended. 3.  Given the thick wall of the esophagus, esophagitis may be present. 4. Atherosclerosis, including left main and left circumflex coronary artery disease. Please note that although the presence  of coronary artery calcium documents the presence of coronary artery disease, the severity of this disease and any potential stenosis cannot be assessed on this non-gated CT examination.  Assessment for potential risk factor modification, dietary therapy or pharmacologic therapy may be warranted, if clinically indicated. 5.  Borderline cardiomegaly.  Original Report Authenticated By: Florencia Reasons, M.D.   Dg Chest Port 1 View  03/02/2012  *RADIOLOGY REPORT*  Clinical Data: Shortness of breath, tachycardia  PORTABLE CHEST - 1 VIEW  Comparison: 02/24/2012 CT  Findings: Heart size upper normal limits.  Low lung volumes. Mild right suprahilar and left retrocardiac opacities and central vascular congestion.  No definite pleural effusion.  No pneumothorax.  No acute osseous abnormality.  IMPRESSION: No significant interval change.  Mild retrocardiac and right suprahilar opacities.  Original Report Authenticated By: Waneta Martins, M.D.   Medications: I have reviewed the patient's current medications. Scheduled Meds:    . amitriptyline  100 mg Oral QHS  . collagenase   Topical Daily  . docusate sodium  100 mg Oral BID  . feeding supplement  237 mL Oral TID BM  . fluconazole  100 mg Oral Daily  . FLUoxetine  40 mg Oral Daily  . heparin  5,000 Units Subcutaneous Q8H  . mulitivitamin with minerals  1 tablet Oral Daily  . oxybutynin  5 mg Oral BID  . piperacillin-tazobactam (ZOSYN)  IV  3.375 g Intravenous Q8H  . polyethylene glycol  17 g Oral Daily  . sucralfate  1 g Oral TID WC & HS  . vancomycin  1,000 mg Intravenous Q12H   Continuous Infusions:    . sodium chloride 20 mL/hr at 02/29/12 1800  . sodium chloride     PRN Meds:.acetaminophen, acetaminophen, iohexol, ondansetron (ZOFRAN) IV, ondansetron, oxyCODONE Assessment/Plan: UTI (lower urinary tract infection) - She is on diflucan 100 mg daily. Night team did see gross pus coming out of her foley. She has been getting better urinary  output the last couple of days. Renal US did not see any hydronephrosis or kidney involvement. She is getting vanc/zosyn for empiric coverage for infectious agent. Bacterial UTI versus HCAP for recent fever. Will continue and narrow as able. Clinically she is worse today and will need close follow up.   Fever - Patient did spike a fever of 101 yesterday and repeat blood cultures were drawn. Those drawn approx 3 days ago were negative also clinical status is worsened today than it was yesterday. Given sudden onset may be aspiration pneumonia versus bowel obstruction versus bacterial UTI that is unmasked by treatment of candida. Levoquin was stopped on Friday after 5 day course in the hospital.  She is getting chest CT to rule out PE and also to evaluate her lung status. Also ordered abdominal x-ray to be done while she is down getting CT to evaluate for obstruction.   PRESSURE ULCER HIP -  Will get wound care consult to care for her decubitus ulcers on her backside. Heels are  in protective holder to prevent ulcer.    Normocytic anemia - Likely due to chronic disease and appears to be stable around 9 although Hg has been better than that in the last day or so. Will await today's CBC.    Leukocytosis - Acutely elevated last night. Am repeating this AM to make sure not a lab error with WBC of 23 however her clinical status has deteriorated dramatically yesterday. Will await abdominal x-ray and CT chest. Possible aspiration pneumonia versus HCAP versus UTI. Her breathing is worse today and her abdomen tender to palpation.    Hypercalcemia - Slightly elevated on last night's BMET, will recheck today and follow.   Metabolic encephalopathy - Pt was doing well until last night and now has become less oriented and talkative again. Will follow closely today.   Disposition - Will call the daughter to discuss later today and await some results. Will depend on mental status and determining the cause of her new  leukocytosis and decrease in clinical condition. Will follow closely today and go evaluate again later today.    LOS: 7 days   Genella Mech 03/02/2012, 10:23 AM

## 2012-03-03 LAB — CBC
Hemoglobin: 8.8 g/dL — ABNORMAL LOW (ref 12.0–15.0)
MCH: 24 pg — ABNORMAL LOW (ref 26.0–34.0)
MCHC: 31.8 g/dL (ref 30.0–36.0)
Platelets: 420 10*3/uL — ABNORMAL HIGH (ref 150–400)
RDW: 19.5 % — ABNORMAL HIGH (ref 11.5–15.5)

## 2012-03-03 LAB — URINE CULTURE: Culture: NO GROWTH

## 2012-03-03 LAB — BASIC METABOLIC PANEL
Calcium: 10.6 mg/dL — ABNORMAL HIGH (ref 8.4–10.5)
GFR calc Af Amer: 56 mL/min — ABNORMAL LOW (ref >90)
GFR calc non Af Amer: 49 mL/min — ABNORMAL LOW (ref >90)
Glucose, Bld: 99 mg/dL (ref 70–99)
Potassium: 4.8 mEq/L (ref 3.5–5.1)
Sodium: 139 mEq/L (ref 135–145)

## 2012-03-03 MED ORDER — MILK AND MOLASSES ENEMA
Freq: Once | RECTAL | Status: AC
  Administered 2012-03-03: 15:00:00 via RECTAL
  Filled 2012-03-03: qty 250

## 2012-03-03 MED ORDER — FLUCONAZOLE 100MG IVPB
100.0000 mg | INTRAVENOUS | Status: DC
  Administered 2012-03-03 – 2012-03-05 (×3): 100 mg via INTRAVENOUS
  Filled 2012-03-03 (×4): qty 50

## 2012-03-03 NOTE — Progress Notes (Signed)
IM Attending  Visited, reviewed all notes and data and discussed with residents.  She is not doing well.  Minimally communicative.  Had fever spikes over weekend and has vague lower abd pain.  Seems to have a UTI with candida and ? bacterial infections as well??  Afebrile last night.  Slight rise in creatinine may be pre-renal.  May need advice about UTI from ID.  Please seek input from Dr. [name] as well.

## 2012-03-03 NOTE — Progress Notes (Addendum)
Nutrition Follow-up  Pt sleeping deeply upon RD entry -- non-responsive. S/p MBSS 3/5. PO intake 0% per flowsheet records. Noted c/o abdominal discomfort from constipation 3/9. Ensure Clinical Strength supplement in place TID.  Noted CWOCN note 3/7 -- left ankle and sacral wounds present.  Diet Order:  Dysphagia 3, thin liquids  Meds: Scheduled Meds:   . amitriptyline  100 mg Oral QHS  . collagenase   Topical Daily  . docusate sodium  100 mg Oral BID  . feeding supplement  237 mL Oral TID BM  . fluconazole (DIFLUCAN) IV  100 mg Intravenous Q24H  . FLUoxetine  40 mg Oral Daily  . heparin  5,000 Units Subcutaneous Q8H  . mulitivitamin with minerals  1 tablet Oral Daily  . oxybutynin  5 mg Oral BID  . piperacillin-tazobactam (ZOSYN)  IV  3.375 g Intravenous Q8H  . polyethylene glycol  17 g Oral Daily  . sucralfate  1 g Oral TID WC & HS  . vancomycin  1,000 mg Intravenous Q12H  . DISCONTD: fluconazole  100 mg Oral Daily   Continuous Infusions:   . sodium chloride 10 mL/hr at 03/02/12 2245  . sodium chloride 100 mL/hr at 03/02/12 2246   PRN Meds:.acetaminophen, acetaminophen, ondansetron (ZOFRAN) IV, ondansetron, oxyCODONE  Labs:  CMP     Component Value Date/Time   NA 139 03/03/2012 0530   K 4.8 03/03/2012 0530   CL 106 03/03/2012 0530   CO2 22 03/03/2012 0530   GLUCOSE 99 03/03/2012 0530   BUN 35* 03/03/2012 0530   CREATININE 1.12* 03/03/2012 0530   CALCIUM 10.6* 03/03/2012 0530   PROT 6.0 02/25/2012 0342   ALBUMIN 2.2* 02/25/2012 0342   AST 21 02/25/2012 0342   ALT 8 02/25/2012 0342   ALKPHOS 141* 02/25/2012 0342   BILITOT 0.3 02/25/2012 0342   GFRNONAA 49* 03/03/2012 0530   GFRAA 56* 03/03/2012 0530     Intake/Output Summary (Last 24 hours) at 03/03/12 1020 Last data filed at 03/03/12 0645  Gross per 24 hour  Intake    475 ml  Output    360 ml  Net    115 ml    CBG (last 3)   Basename 03/01/12 2242  GLUCAP 125*    Weight Status:  77.1 kg (3/3) -- no new weight  available  Re-estimated needs:  1900-2100 kcals, 95-105 gm protein  Nutrition Dx:  Increased Nutrition Needs, ongoing  Goal:  Meet >90% of estimated nutrition needs, unmet Monitor: PO intake,weight, labs, I/O's  Intervention:    If decreased responsiveness continues, recommend short-term EN initiation  Continue Ensure Clinical Strength PO TID  RD to follow for nutrition care plan  Alger Memos Pager #:  [phone]

## 2012-03-03 NOTE — Progress Notes (Signed)
Subjective: The patient is intermittently awake and drifting off to sleep when I enter. She is getting her mouth cleaned by the nurse as she was eating and falling asleep instead of swallowing. She appears to be in pain. She says she is in pain but cannot clarify where. Her belly is tender to the touch. She denies pain in her legs and arms. No fever overnight. No bowel movement since 3/6  Objective: Vital signs in last 24 hours: Filed Vitals:   03/02/12 1153 03/02/12 1418 03/02/12 2030 03/03/12 0521  BP:  115/49 113/73 113/72  Pulse: 96 104 106 109  Temp:  97.7 F (36.5 C) 99.2 F (37.3 C) 99 F (37.2 C)  TempSrc:  Oral Axillary Oral  Resp:  18 23 20   Height:      Weight:      SpO2:  96% 97% 97%   Weight change:   Intake/Output Summary (Last 24 hours) at 03/03/12 1023 Last data filed at 03/03/12 0645  Gross per 24 hour  Intake    475 ml  Output    360 ml  Net    115 ml   Physical Exam: General: sleeping when I enter, tossing and turning, awakens and falls back asleep rapidly, not able to clearly answer questions HEENT: not opening her eyes voluntarily Cardiac: S 1 S 2 heard, no overt murmurs heard Pulm: good air movement heard bilaterally, snoring while I listen but no extra sounds Abd: lower abdomen was fairly hard, tender to the touch, very hypoactive BS Ext: warm and well perfused, mild pedal edema, heels are in protective holster to prevent bed sores, no tenderness to palpation Ext: arms seem to be less swollen although she does not move her left hand at all and cannot squeeze my hand, very weak squeeze with the right, pulses intact bilaterally and color good and warm  Backside: There are multiple bed sores on her buttocks Neuro: hard to determine orientation as she was not that responsive, moving arms and writhing in bed, will squeeze with right hand but not moving any other extremity, withdraws from pain  Lab Results: Basic Metabolic Panel:  Lab 03/03/12 1610 03/02/12  0900 03/02/12 0002  NA 139 136 --  K 4.8 4.7 --  CL 106 106 --  CO2 22 26 --  GLUCOSE 99 138* --  BUN 35* 27* --  CREATININE 1.12* 0.80 --  CALCIUM 10.6* 10.5 --  MG -- -- 1.9  PHOS -- -- --   CBC:  Lab 03/03/12 0530 03/02/12 1440  WBC 15.7* 13.2*  NEUTROABS -- --  HGB 8.8* 9.3*  HCT 27.7* 29.4*  MCV 75.7* 75.2*  PLT 420* 236   Cardiac Enzymes:  Lab 03/01/12 2330 02/25/12 1028  CKTOTAL 8 9  CKMB 0.9 1.4  CKMBINDEX -- --  TROPONINI <0.30 <0.30   CBG:  Lab 03/01/12 2242  GLUCAP 125*   Micro Results: Recent Results (from the past 240 hour(s))  CULTURE, BLOOD (ROUTINE X 2)     Status: Normal   Collection Time   02/24/12 12:40 PM      Component Value Range Status Comment   Specimen Description BLOOD ARM RIGHT   Final    Special Requests BOTTLES DRAWN AEROBIC ONLY [facility]   Final    Culture  Setup Time [id number]   Final    Culture NO GROWTH 5 DAYS   Final    Report Status 03/01/2012 FINAL   Final   CULTURE, BLOOD (ROUTINE X 2)  Status: Normal   Collection Time   02/24/12 12:43 PM      Component Value Range Status Comment   Specimen Description BLOOD HAND LEFT   Final    Special Requests BOTTLES DRAWN AEROBIC AND ANAEROBIC 10CC   Final    Culture  Setup Time [id number]   Final    Culture NO GROWTH 5 DAYS   Final    Report Status 03/01/2012 FINAL   Final   URINE CULTURE     Status: Normal   Collection Time   02/24/12  1:14 PM      Component Value Range Status Comment   Specimen Description URINE, CATHETERIZED   Final    Special Requests NONE   Final    Culture  Setup Time [id number]   Final    Colony Count NO GROWTH   Final    Culture NO GROWTH   Final    Report Status 02/25/2012 FINAL   Final   MRSA PCR SCREENING     Status: Normal   Collection Time   02/24/12  7:14 PM      Component Value Range Status Comment   MRSA by PCR NEGATIVE  NEGATIVE  Final   URINE CULTURE     Status: Normal   Collection Time   02/24/12  8:06 PM      Component Value Range Status  Comment   Specimen Description URINE, CATHETERIZED   Final    Special Requests NONE   Final    Culture  Setup Time [id number]   Final    Colony Count >=100,000 COLONIES/ML   Final    Culture YEAST   Final    Report Status 02/26/2012 FINAL   Final   CULTURE, BLOOD (ROUTINE X 2)     Status: Normal   Collection Time   02/24/12  8:10 PM      Component Value Range Status Comment   Specimen Description BLOOD RIGHT HAND   Final    Special Requests BOTTLES DRAWN AEROBIC ONLY 10CC   Final    Culture  Setup Time [id number]   Final    Culture NO GROWTH 5 DAYS   Final    Report Status 03/02/2012 FINAL   Final   CULTURE, BLOOD (ROUTINE X 2)     Status: Normal   Collection Time   02/24/12  8:13 PM      Component Value Range Status Comment   Specimen Description BLOOD LEFT HAND   Final    Special Requests BOTTLES DRAWN AEROBIC ONLY 10CC   Final    Culture  Setup Time [id number]   Final    Culture NO GROWTH 5 DAYS   Final    Report Status 03/02/2012 FINAL   Final   CULTURE, BLOOD (ROUTINE X 2)     Status: Normal (Preliminary result)   Collection Time   02/27/12  7:33 PM      Component Value Range Status Comment   Specimen Description BLOOD RIGHT HAND   Final    Special Requests BOTTLES DRAWN AEROBIC AND ANAEROBIC 10CC   Final    Culture  Setup Time [id number]   Final    Culture     Final    Value:        BLOOD CULTURE RECEIVED NO GROWTH TO DATE CULTURE WILL BE HELD FOR 5 DAYS BEFORE ISSUING A FINAL NEGATIVE REPORT   Report Status PENDING   Incomplete   CULTURE, BLOOD (ROUTINE X 2)     Status:  Normal (Preliminary result)   Collection Time   02/27/12  7:47 PM      Component Value Range Status Comment   Specimen Description BLOOD LEFT HAND   Final    Special Requests BOTTLES DRAWN AEROBIC AND ANAEROBIC 10CC   Final    Culture  Setup Time [id number]   Final    Culture     Final    Value:        BLOOD CULTURE RECEIVED NO GROWTH TO DATE CULTURE WILL BE HELD FOR 5 DAYS BEFORE ISSUING A FINAL  NEGATIVE REPORT   Report Status PENDING   Incomplete   CULTURE, BLOOD (ROUTINE X 2)     Status: Normal (Preliminary result)   Collection Time   03/01/12 12:29 AM      Component Value Range Status Comment   Specimen Description BLOOD LEFT HAND   Final    Special Requests BOTTLES DRAWN AEROBIC ONLY 5CC   Final    Culture  Setup Time [id number]   Final    Culture     Final    Value:        BLOOD CULTURE RECEIVED NO GROWTH TO DATE CULTURE WILL BE HELD FOR 5 DAYS BEFORE ISSUING A FINAL NEGATIVE REPORT   Report Status PENDING   Incomplete   CULTURE, BLOOD (ROUTINE X 2)     Status: Normal (Preliminary result)   Collection Time   03/02/12 12:29 AM      Component Value Range Status Comment   Specimen Description BLOOD RIGHT ARM   Final    Special Requests     Final    Value: BOTTLES DRAWN AEROBIC AND ANAEROBIC 10CC BLUE,5CC RED   Culture  Setup Time [id number]   Final    Culture     Final    Value:        BLOOD CULTURE RECEIVED NO GROWTH TO DATE CULTURE WILL BE HELD FOR 5 DAYS BEFORE ISSUING A FINAL NEGATIVE REPORT   Report Status PENDING   Incomplete    Studies/Results: Dg Abd 1 View  03/02/2012  *RADIOLOGY REPORT*  Clinical Data: Abdominal pain, nausea and vomiting.  ABDOMEN - 1 VIEW  Comparison: 02/18/2012  Findings: There is moderate stool interspersed with barium in the proximal colon.  The patient did ingest barium for a swallowing function study on 02/26/2012.  The rest of the colon shows no significant stool burden.  There is no evidence of bowel obstruction or ileus.  Stable calcification in the left hemipelvis likely relates to degenerative calcification in the uterus or adnexal region.  IMPRESSION: No overt bowel obstruction.  There is a moderate amount of fecal material in the ascending colon which is mixed with previously ingested barium.  Original Report Authenticated By: Reola Calkins, M.D.   Ct Angio Chest W/cm &/or Wo Cm  03/02/2012  *RADIOLOGY REPORT*  Clinical Data: Back  pain.  Elevated D-dimer.  CT ANGIOGRAPHY CHEST  Technique:  Multidetector CT imaging of the chest using the standard protocol during bolus administration of intravenous contrast. Multiplanar reconstructed images including MIPs were obtained and reviewed to evaluate the vascular anatomy.  Contrast: 74mL OMNIPAQUE IOHEXOL 350 MG/ML IV SOLN  Comparison: Multiple priors , most recentlyCT of the thorax 02/24/2012  Findings:  Mediastinum: Although this study is slightly limited by respiratory motion, there is no evidence of central, lobar or segmental sized pulmonary embolism (secondary to respiratory motion, some areas of the subsegmental pulmonary arteries are obscured by motion such that smaller subsegmental  sized pulmonary embolism cannot be entirely excluded). Heart size is borderline enlarged. There is no significant pericardial fluid, thickening or pericardial calcification. There is atherosclerosis of the thoracic aorta, the great vessels of the mediastinum and the coronary arteries, including calcified atherosclerotic plaque in the left main and left circumflex coronary arteries. No pathologically enlarged mediastinal or hilar lymph nodes. The esophagus is diffusely patulous and fluid - filled with a thickened wall.  Lungs/Pleura: There continue to be some patchy areas of interstitial prominence and small focal regions of ground-glass attenuation air space disease scattered throughout the lungs bilaterally (most pronounced in a dependent distribution), similar compared to prior examination from 02/24/2012.  Dependent atelectasis and/or scarring is noted in the lung bases bilaterally (unchanged).  No definite pleural effusions.  No definite suspicious appearing pulmonary nodules or masses are identified (please note that accurate assessment for small pulmonary nodules is limited by patient respiratory motion).  Upper Abdomen: Unremarkable.  Musculoskeletal: There are no aggressive appearing lytic or blastic  lesions noted in the visualized portions of the skeleton.  IMPRESSION: 1.  Although the study is slightly limited by respiratory motion, there is no evidence to suggest clinically relevant central, lobar or segmental sized pulmonary embolism.  Smaller subsegmental sized pulmonary embolism cannot be entirely excluded. 2.  Persistence of patchy areas of interstitial prominence and ground-glass attenuation air space disease in the lungs bilaterally, predominately in a dependent pattern.  Given the patulous and fluid-filled esophagus, these findings may represent sequela of repeated aspiration.  Clinical correlation for signs and symptoms of aspiration are recommended. 3.  Given the thick wall of the esophagus, esophagitis may be present. 4. Atherosclerosis, including left main and left circumflex coronary artery disease. Please note that although the presence of coronary artery calcium documents the presence of coronary artery disease, the severity of this disease and any potential stenosis cannot be assessed on this non-gated CT examination.  Assessment for potential risk factor modification, dietary therapy or pharmacologic therapy may be warranted, if clinically indicated. 5.  Borderline cardiomegaly.  Original Report Authenticated By: Florencia Reasons, M.D.   Dg Chest Port 1 View  03/02/2012  *RADIOLOGY REPORT*  Clinical Data: Shortness of breath, tachycardia  PORTABLE CHEST - 1 VIEW  Comparison: 02/24/2012 CT  Findings: Heart size upper normal limits.  Low lung volumes. Mild right suprahilar and left retrocardiac opacities and central vascular congestion.  No definite pleural effusion.  No pneumothorax.  No acute osseous abnormality.  IMPRESSION: No significant interval change.  Mild retrocardiac and right suprahilar opacities.  Original Report Authenticated By: Waneta Martins, M.D.   Medications: I have reviewed the patient's current medications. Scheduled Meds:    . amitriptyline  100 mg Oral QHS    . collagenase   Topical Daily  . docusate sodium  100 mg Oral BID  . feeding supplement  237 mL Oral TID BM  . fluconazole (DIFLUCAN) IV  100 mg Intravenous Q24H  . FLUoxetine  40 mg Oral Daily  . heparin  5,000 Units Subcutaneous Q8H  . mulitivitamin with minerals  1 tablet Oral Daily  . oxybutynin  5 mg Oral BID  . piperacillin-tazobactam (ZOSYN)  IV  3.375 g Intravenous Q8H  . polyethylene glycol  17 g Oral Daily  . sucralfate  1 g Oral TID WC & HS  . vancomycin  1,000 mg Intravenous Q12H  . DISCONTD: fluconazole  100 mg Oral Daily   Continuous Infusions:    . sodium chloride 10 mL/hr at 03/02/12  2245  . sodium chloride 100 mL/hr at 03/02/12 2246   PRN Meds:.acetaminophen, acetaminophen, ondansetron (ZOFRAN) IV, ondansetron, oxyCODONE Assessment/Plan: UTI (lower urinary tract infection) - She is on diflucan 100 mg daily, will switch to IV while she is NPO. Night team did see gross pus coming out of her foley. She has been getting better urinary output the last couple of days. Renal US did not see any hydronephrosis or kidney involvement. She is getting vanc/zosyn for empiric coverage for infectious agent. Bacterial UTI versus HCAP for recent fever. Will continue and narrow as able. Clinically she not improving and is still lethargic and in pain.   Fever - Patient did spike a fever of 101 yesterday and repeat blood cultures were drawn. Those drawn approx 3 days ago were negative also clinical status is worsened today than it was yesterday. Given sudden onset may be aspiration pneumonia versus bowel obstruction versus bacterial UTI that is unmasked by treatment of candida. Levoquin was stopped on Friday after 5 day course in the hospital.  She got chest CT to rule out PE and also to evaluate her lung status which was deemed unchanged from past chest CT. Also ordered abdominal x-ray which showed moderate stool and no signs of obstruction. May consider CT abdomen  to rule out abdominal  process as this is where her pain is located and consider head CT as she has decreased strength and will not move her left hand and is having change in mental status. Did make her NPO and get new swallow evaluation. Will give enema today to help move her bowels. If BM does not relieve abdominal pain then will order CT abdomen as she is not able to tell us what is hurting and where.   PRESSURE ULCER HIP -  Wound care consult to care for her decubitus ulcers on her backside. Heels are in protective holsters to prevent ulcer.    Normocytic anemia - Likely due to chronic disease and appears to be stable around 9 although we have been watching closely as she was having incident of vomiting blood. Her Hg today is 8.8 which is stable.    Leukocytosis - Please see fever above but WBC down to 15 today from 23 however clinical status is still impaired. Will likely need further work up to determine source.    Hypercalcemia - Slightly elevated on last night's BMET, will recheck today and follow.   AKI - Cr is increasing and is 1.12 today from about .5 on admission. Will increase her fluids to 125 cc/hr as she is not taking in good PO intake. Recent renal US did not show obstruction so likely pre-renal.   Metabolic encephalopathy - Pt was doing well until last night and now has become less oriented and talkative again. Will follow closely today.   Disposition - Still trying to localize acute mental status change.     LOS: 8 days   Genella Mech 03/03/2012, 10:23 AM

## 2012-03-03 NOTE — Progress Notes (Signed)
Clinical Social Work-[facility] CSW now following, received report from prior service line CSW and is familiar with pt from prior hospitalizations-CSW will facilitate pt d/c back to [facility] when ordered. Jodean Lima, [phone].

## 2012-03-03 NOTE — Progress Notes (Signed)
Speech Language/Pathology Clinical/Bedside Swallow Evaluation Patient Details  Name: [name] [name] MRN: [id number] DOB: 09-May-1941 Today's Date: 03/03/2012  Past Medical History:  Past Medical History  Diagnosis Date  . Multiple sclerosis   . GERD (gastroesophageal reflux disease)   . Depression   . Recurrent UTI   . Recurrent pneumonia   . Chronic constipation   . Diverticulosis    Past Surgical History:  Past Surgical History  Procedure Date  . Cholecystectomy   . Orif humeral shaft fracture 01/2011  . Video assisted thoracoscopy 06/2010  . Closed reduction shoulder dislocation 04/2010    Fracture dislocation  . Femur im nail 01/13/2012    Procedure: INTRAMEDULLARY (IM) RETROGRADE FEMORAL NAILING;  Surgeon: Thera Flake., MD;  Location: [facility] OR;  Service: Orthopedics;  Laterality: Left;  . Femoral artery exploration 01/13/2012    Procedure: FEMORAL ARTERY EXPLORATION;  Surgeon: Pryor Ochoa, MD;  Location: [facility] OR;  Service: Vascular;  Laterality: Left;   HPI:  This is a 71 year old female with past medical history of multiple sclerosis, with an indwelling catheter and multiple decubitus ulcers, recurrent UTIs and recurrent pneumonias with frequent hospitalizations the last several months.  Poor appetite per family.  MBS on 02/26/12 showed overall normal swallow function, pt was recommended to initiate a regular diet with thin liquids. Since then pt has had fluctuating alertness, some nausea/vomiting. RN reports that this morning the pt pocketed bites of puree, no oral transit of bolus or swallow. Discussed pt with daughter who reports that typically she is upright at meals, talkative, alert. 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 Marina Goodell with the last being several years ago though dtr reports she has not seen any indication of recurrance of stricture (choking, regurgitation)   Assessment/Recommendations/Treatment Plan Suspected Esophageal Findings Suspected  Esophageal Findings: Belching  SLP Assessment Clinical Impression Statement: Pt presents with poor alertness, poor awareness of bolus, reclined posture, residuals from breakfast in oral cavity with little awareness as well subtle signs of aspriation with thin liquids. Pt is not appropriate for PO intake at this time due to LOA. However, pt with recent MBS indicating functional swallow, no aspiration/penetration. Pt with fluctuating status due to multiple sclerosis and infections, ability to take POs safely transient though a baseline dysphagia is not likely. Recommend pt remain NPO, SLP will follow for PO readiness as alertness improves.  Risk for Aspiration: Moderate Other Related Risk Factors: Lethargy;History of GERD  Swallow Evaluation Recommendations General Recommendation: NPO Solid Consistency: Regular Liquid Consistency: Thin Liquid Administration via: Straw;Cup Medication Administration: Whole meds with liquid Supervision: Intermittent supervision to cue for compensatory strategies Oral Care Recommendations: Oral care QID Follow up Recommendations: None  Treatment Plan Treatment Plan Recommendations: Therapy as outlined in treatment plan below Speech Therapy Frequency: min 3x week Treatment Duration: 2 weeks  Prognosis Prognosis for Safe Diet Advancement: Good  Individuals Consulted Consulted and Agree with Results and Recommendations: Family member/caregiver Family Member Consulted: daughter  Swallowing Goals  SLP Swallowing Goals Goal #3: Pt will demonstrate full alertness for appropriate PO trials to determine readiness for diet or further objective testing.   Harlon Ditty, MA CCC-SLP [phone]   Claudine Mouton 03/03/2012,12:28 PM

## 2012-03-03 NOTE — Progress Notes (Signed)
Physical Therapy Treatment Patient Details Name: [name] [name] MRN: [id number] DOB: Aug 06, 1941 Today's Date: 03/03/2012  PT Assessment/Plan  PT - Assessment/Plan Comments on Treatment Session: Pt showing a decline in status/ a decline in functional mobility; Minimal response to PROM performed bil LEs today, and required Tot assist for rolling (in prep for enema); Agree with continued need for lift equipment for safe OOB transfers PT Plan: Discharge plan remains appropriate PT Frequency: Min 2X/week Follow Up Recommendations: Skilled nursing facility Equipment Recommended: Defer to next venue PT Goals  Acute Rehab PT Goals Time For Goal Achievement: 2 weeks Pt will Roll Supine to Left Side: with mod assist PT Goal: Rolling Supine to Left Side - Progress: Not progressing  PT Treatment Precautions/Restrictions  Precautions Precautions: Fall Required Braces or Orthoses:  (Will request PRAFO to be alternated R and LLEs) Restrictions Weight Bearing Restrictions: No Other Position/Activity Restrictions: per Dr Darnelle Catalan progress note on 02/11/12-per Dr Madelon Lips, pt can bear weight on L LE (but weight not specified) Mobility (including Balance) Bed Mobility Bed Mobility: Yes Rolling Left: 1: +1 Total assist;Patient percentage (comment) (pt=0%) Rolling Left Details (indicate cue type and reason): Pt with decr ability to follow commands;  did not actively assist in roll to left    Exercise  Performed PROM to bil LEs with minimal pt response;  Total Joint Exercises Heel Slides: PROM;Both;5 reps;Supine (small groan with passive hip and knee flexion LLE)  Ankle Circles/Pumps: PROM;Both;5 reps;Supine End of Session PT - End of Session Activity Tolerance: Treatment limited secondary to medical complications (Comment) (Decr arousal/decr ability to follow commands) Patient left: in bed (with RN, who had started an enema) Nurse Communication: Need for lift equipment General Behavior During Session:  Lethargic Cognition: Impaired Cognitive Impairment: Presents today with decr arousal, decr alertness, inability to follow commands  [name], [place] [phone]  03/03/2012, 3:11 PM

## 2012-03-04 LAB — URINALYSIS, MICROSCOPIC ONLY
Glucose, UA: NEGATIVE mg/dL
Specific Gravity, Urine: 1.018 (ref 1.005–1.030)
Urobilinogen, UA: 1 mg/dL (ref 0.0–1.0)

## 2012-03-04 LAB — CULTURE, BLOOD (ROUTINE X 2): Culture  Setup Time: 201303101124

## 2012-03-04 LAB — CBC
Hemoglobin: 7.1 g/dL — ABNORMAL LOW (ref 12.0–15.0)
RBC: 3 MIL/uL — ABNORMAL LOW (ref 3.87–5.11)

## 2012-03-04 LAB — BASIC METABOLIC PANEL
CO2: 21 mEq/L (ref 19–32)
Chloride: 112 mEq/L (ref 96–112)
Glucose, Bld: 82 mg/dL (ref 70–99)
Potassium: 4.1 mEq/L (ref 3.5–5.1)
Sodium: 143 mEq/L (ref 135–145)

## 2012-03-04 LAB — PREPARE RBC (CROSSMATCH)

## 2012-03-04 NOTE — Progress Notes (Signed)
Subjective: The patient is intermittently awake and drifting off to sleep when I enter. She did have large bowel movement yesterday with two enemas. She was not able to participate in PT and speech saw her and recommended that she was not awake enough at that time for PO intake. Today she is able to wake up a little more but her consciousness is still fluctuating. Her belly seems less tender and she says she is in a little pain but cannot specify where. She says her legs don't hurt. Her right arm doesn't hurt.   Objective: Vital signs in last 24 hours: Filed Vitals:   03/03/12 0521 03/03/12 1510 03/03/12 2100 03/04/12 0520  BP: 113/72 112/53 110/67 121/53  Pulse: 109 106 101 90  Temp: 99 F (37.2 C) 98 F (36.7 C) 97.4 F (36.3 C) 98.2 F (36.8 C)  TempSrc: Oral Oral Oral Axillary  Resp: 20 20 20 18   Height:      Weight:      SpO2: 97% 97% 96% 98%   Weight change:   Intake/Output Summary (Last 24 hours) at 03/04/12 1610 Last data filed at 03/04/12 0522  Gross per 24 hour  Intake      0 ml  Output   2000 ml  Net  -2000 ml   Physical Exam: General: sleeping when I enter, able to answer basic questions HEENT: will open her eyes and follows sound with her head Cardiac: S 1 S 2 heard, no overt murmurs heard Pulm: good air movement heard bilaterally Abd: lower abdomen was slightly hard, tender to deep palpation, hypoactive BS Ext: warm and well perfused, mild pedal edema, heels are in protective holster to prevent bed sores, no tenderness to palpation Ext: arms seem to be less swollen although she does not move her left hand at all and cannot squeeze my hand, weak squeeze with the right, pulses intact bilaterally and color good and warm  Backside: There are multiple bed sores on her buttocks Neuro: hard to determine orientation as she was not that responsive, moving her right arm, will open her eyes voluntarily today.   Lab Results: Basic Metabolic Panel:  Lab 03/03/12 9604  03/02/12 0900 03/02/12 0002  NA 139 136 --  K 4.8 4.7 --  CL 106 106 --  CO2 22 26 --  GLUCOSE 99 138* --  BUN 35* 27* --  CREATININE 1.12* 0.80 --  CALCIUM 10.6* 10.5 --  MG -- -- 1.9  PHOS -- -- --   CBC:  Lab 03/03/12 0530 03/02/12 1440  WBC 15.7* 13.2*  NEUTROABS -- --  HGB 8.8* 9.3*  HCT 27.7* 29.4*  MCV 75.7* 75.2*  PLT 420* 236   Cardiac Enzymes:  Lab 03/01/12 2330  CKTOTAL 8  CKMB 0.9  CKMBINDEX --  TROPONINI <0.30   CBG:  Lab 03/01/12 2242  GLUCAP 125*   Micro Results: Recent Results (from the past 240 hour(s))  CULTURE, BLOOD (ROUTINE X 2)     Status: Normal   Collection Time   02/24/12 12:40 PM      Component Value Range Status Comment   Specimen Description BLOOD ARM RIGHT   Final    Special Requests BOTTLES DRAWN AEROBIC ONLY 8CC   Final    Culture  Setup Time [id number]   Final    Culture NO GROWTH 5 DAYS   Final    Report Status 03/01/2012 FINAL   Final   CULTURE, BLOOD (ROUTINE X 2)     Status: Normal  Collection Time   02/24/12 12:43 PM      Component Value Range Status Comment   Specimen Description BLOOD HAND LEFT   Final    Special Requests BOTTLES DRAWN AEROBIC AND ANAEROBIC 10CC   Final    Culture  Setup Time [id number]   Final    Culture NO GROWTH 5 DAYS   Final    Report Status 03/01/2012 FINAL   Final   URINE CULTURE     Status: Normal   Collection Time   02/24/12  1:14 PM      Component Value Range Status Comment   Specimen Description URINE, CATHETERIZED   Final    Special Requests NONE   Final    Culture  Setup Time [id number]   Final    Colony Count NO GROWTH   Final    Culture NO GROWTH   Final    Report Status 02/25/2012 FINAL   Final   MRSA PCR SCREENING     Status: Normal   Collection Time   02/24/12  7:14 PM      Component Value Range Status Comment   MRSA by PCR NEGATIVE  NEGATIVE  Final   URINE CULTURE     Status: Normal   Collection Time   02/24/12  8:06 PM      Component Value Range Status Comment   Specimen  Description URINE, CATHETERIZED   Final    Special Requests NONE   Final    Culture  Setup Time [id number]   Final    Colony Count >=100,000 COLONIES/ML   Final    Culture YEAST   Final    Report Status 02/26/2012 FINAL   Final   CULTURE, BLOOD (ROUTINE X 2)     Status: Normal   Collection Time   02/24/12  8:10 PM      Component Value Range Status Comment   Specimen Description BLOOD RIGHT HAND   Final    Special Requests BOTTLES DRAWN AEROBIC ONLY 10CC   Final    Culture  Setup Time [id number]   Final    Culture NO GROWTH 5 DAYS   Final    Report Status 03/02/2012 FINAL   Final   CULTURE, BLOOD (ROUTINE X 2)     Status: Normal   Collection Time   02/24/12  8:13 PM      Component Value Range Status Comment   Specimen Description BLOOD LEFT HAND   Final    Special Requests BOTTLES DRAWN AEROBIC ONLY 10CC   Final    Culture  Setup Time [id number]   Final    Culture NO GROWTH 5 DAYS   Final    Report Status 03/02/2012 FINAL   Final   CULTURE, BLOOD (ROUTINE X 2)     Status: Normal (Preliminary result)   Collection Time   02/27/12  7:33 PM      Component Value Range Status Comment   Specimen Description BLOOD RIGHT HAND   Final    Special Requests BOTTLES DRAWN AEROBIC AND ANAEROBIC 10CC   Final    Culture  Setup Time [id number]   Final    Culture     Final    Value:        BLOOD CULTURE RECEIVED NO GROWTH TO DATE CULTURE WILL BE HELD FOR 5 DAYS BEFORE ISSUING A FINAL NEGATIVE REPORT   Report Status PENDING   Incomplete   CULTURE, BLOOD (ROUTINE X 2)     Status: Normal (Preliminary result)  Collection Time   02/27/12  7:47 PM      Component Value Range Status Comment   Specimen Description BLOOD LEFT HAND   Final    Special Requests BOTTLES DRAWN AEROBIC AND ANAEROBIC 10CC   Final    Culture  Setup Time [id number]   Final    Culture     Final    Value:        BLOOD CULTURE RECEIVED NO GROWTH TO DATE CULTURE WILL BE HELD FOR 5 DAYS BEFORE ISSUING A FINAL NEGATIVE REPORT    Report Status PENDING   Incomplete   CULTURE, BLOOD (ROUTINE X 2)     Status: Normal (Preliminary result)   Collection Time   03/01/12 12:29 AM      Component Value Range Status Comment   Specimen Description BLOOD LEFT HAND   Final    Special Requests BOTTLES DRAWN AEROBIC ONLY 5CC   Final    Culture  Setup Time [id number]   Final    Culture     Final    Value:        BLOOD CULTURE RECEIVED NO GROWTH TO DATE CULTURE WILL BE HELD FOR 5 DAYS BEFORE ISSUING A FINAL NEGATIVE REPORT   Report Status PENDING   Incomplete   CULTURE, BLOOD (ROUTINE X 2)     Status: Normal (Preliminary result)   Collection Time   03/02/12 12:29 AM      Component Value Range Status Comment   Specimen Description BLOOD RIGHT ARM   Final    Special Requests     Final    Value: BOTTLES DRAWN AEROBIC AND ANAEROBIC 10CC BLUE,5CC RED   Culture  Setup Time [id number]   Final    Culture     Final    Value: GRAM POSITIVE COCCI IN CLUSTERS     Note: Gram Stain Report Called to,Read Back By and Verified With: [facility] COLLINS 03/03/12 @1326  BY KRAWS   Report Status PENDING   Incomplete   URINE CULTURE     Status: Normal   Collection Time   03/02/12  3:51 AM      Component Value Range Status Comment   Specimen Description URINE, CATHETERIZED   Final    Special Requests NONE   Final    Culture  Setup Time [id number]   Final    Colony Count NO GROWTH   Final    Culture NO GROWTH   Final    Report Status 03/03/2012 FINAL   Final    Studies/Results: Dg Abd 1 View  03/02/2012  *RADIOLOGY REPORT*  Clinical Data: Abdominal pain, nausea and vomiting.  ABDOMEN - 1 VIEW  Comparison: 02/18/2012  Findings: There is moderate stool interspersed with barium in the proximal colon.  The patient did ingest barium for a swallowing function study on 02/26/2012.  The rest of the colon shows no significant stool burden.  There is no evidence of bowel obstruction or ileus.  Stable calcification in the left hemipelvis likely relates to  degenerative calcification in the uterus or adnexal region.  IMPRESSION: No overt bowel obstruction.  There is a moderate amount of fecal material in the ascending colon which is mixed with previously ingested barium.  Original Report Authenticated By: Reola Calkins, M.D.   Ct Angio Chest W/cm &/or Wo Cm  03/02/2012  *RADIOLOGY REPORT*  Clinical Data: Back pain.  Elevated D-dimer.  CT ANGIOGRAPHY CHEST  Technique:  Multidetector CT imaging of the chest using the standard protocol during bolus  administration of intravenous contrast. Multiplanar reconstructed images including MIPs were obtained and reviewed to evaluate the vascular anatomy.  Contrast: 74mL OMNIPAQUE IOHEXOL 350 MG/ML IV SOLN  Comparison: Multiple priors , most recentlyCT of the thorax 02/24/2012  Findings:  Mediastinum: Although this study is slightly limited by respiratory motion, there is no evidence of central, lobar or segmental sized pulmonary embolism (secondary to respiratory motion, some areas of the subsegmental pulmonary arteries are obscured by motion such that smaller subsegmental sized pulmonary embolism cannot be entirely excluded). Heart size is borderline enlarged. There is no significant pericardial fluid, thickening or pericardial calcification. There is atherosclerosis of the thoracic aorta, the great vessels of the mediastinum and the coronary arteries, including calcified atherosclerotic plaque in the left main and left circumflex coronary arteries. No pathologically enlarged mediastinal or hilar lymph nodes. The esophagus is diffusely patulous and fluid - filled with a thickened wall.  Lungs/Pleura: There continue to be some patchy areas of interstitial prominence and small focal regions of ground-glass attenuation air space disease scattered throughout the lungs bilaterally (most pronounced in a dependent distribution), similar compared to prior examination from 02/24/2012.  Dependent atelectasis and/or scarring is noted  in the lung bases bilaterally (unchanged).  No definite pleural effusions.  No definite suspicious appearing pulmonary nodules or masses are identified (please note that accurate assessment for small pulmonary nodules is limited by patient respiratory motion).  Upper Abdomen: Unremarkable.  Musculoskeletal: There are no aggressive appearing lytic or blastic lesions noted in the visualized portions of the skeleton.  IMPRESSION: 1.  Although the study is slightly limited by respiratory motion, there is no evidence to suggest clinically relevant central, lobar or segmental sized pulmonary embolism.  Smaller subsegmental sized pulmonary embolism cannot be entirely excluded. 2.  Persistence of patchy areas of interstitial prominence and ground-glass attenuation air space disease in the lungs bilaterally, predominately in a dependent pattern.  Given the patulous and fluid-filled esophagus, these findings may represent sequela of repeated aspiration.  Clinical correlation for signs and symptoms of aspiration are recommended. 3.  Given the thick wall of the esophagus, esophagitis may be present. 4. Atherosclerosis, including left main and left circumflex coronary artery disease. Please note that although the presence of coronary artery calcium documents the presence of coronary artery disease, the severity of this disease and any potential stenosis cannot be assessed on this non-gated CT examination.  Assessment for potential risk factor modification, dietary therapy or pharmacologic therapy may be warranted, if clinically indicated. 5.  Borderline cardiomegaly.  Original Report Authenticated By: Florencia Reasons, M.D.   Medications: I have reviewed the patient's current medications. Scheduled Meds:    . amitriptyline  100 mg Oral QHS  . collagenase   Topical Daily  . docusate sodium  100 mg Oral BID  . feeding supplement  237 mL Oral TID BM  . fluconazole (DIFLUCAN) IV  100 mg Intravenous Q24H  . FLUoxetine   40 mg Oral Daily  . heparin  5,000 Units Subcutaneous Q8H  . milk and molasses   Rectal Once  . mulitivitamin with minerals  1 tablet Oral Daily  . oxybutynin  5 mg Oral BID  . piperacillin-tazobactam (ZOSYN)  IV  3.375 g Intravenous Q8H  . polyethylene glycol  17 g Oral Daily  . sucralfate  1 g Oral TID WC & HS  . vancomycin  1,000 mg Intravenous Q12H  . DISCONTD: fluconazole  100 mg Oral Daily   Continuous Infusions:    . sodium chloride  10 mL/hr at 03/02/12 2245  . sodium chloride 125 mL/hr at 03/04/12 0116   PRN Meds:.acetaminophen, acetaminophen, ondansetron (ZOFRAN) IV, ondansetron, oxyCODONE Assessment/Plan:  UTI (lower urinary tract infection) - She is on diflucan 100 mg daily IV while she is not taking liquids. Her catheter was switched last night and good return was obtained. Urine culture from 3/10 has partially resulted today as no growth so probably could finish course of diflucan and stop.   Fever - Patient did spike a fever of 101 on 3/10 and blood and urine cultures were drawn. Blood cultures have partially resulted to have gram positive cocci in clusters and urine culture showed no growth. Afebrile for 24 hours so will redraw cultures today. Clinical status is clearing slightly and getting back some level of consciousness. Will give ice chips this morning and possibly advance to liquids if she is able to stay awake.   Bacteremia - Will await speciation but likely staff given gram positive in clusters. Is on appropriate treatment with vancomycin. Did curbside ID to see if they think we need to get TEE and they recommended if staff aureus then yes and if coagulase negative then TEE is not indicated. Will discontinue Zosyn at this time.   PRESSURE ULCER HIP -  Wound care consult to care for her decubitus ulcers on her backside. Heels are in protective holsters to prevent ulcer.    Normocytic anemia - Likely due to chronic disease and appears to be stable around 9 although we  have been watching closely as she was having incident of vomiting blood. Her Hg will be checked today and if stable will not recheck tomorrow.     Leukocytosis - Please see fever above but WBC down yesterday and are rechecking today. If it does continue to trend down will not recheck tomorrow. Clinical status is improving slightly but still impaired.    Hypercalcemia - Slightly elevated on last night's BMET, will recheck today and follow.   AKI - Cr was increasing and will check again today. Was about .5 on admission. Will leave her fluids at 125 cc/hr normal saline as she is not taking in good PO intake. Recent renal US did not show obstruction so likely pre-renal.   Metabolic encephalopathy - Pt was doing well until last night and now has become less oriented recently. Is more awake today but still not back to baseline.   Disposition - Likely recent change due to bacteremia will continue IV vancomycin and discontinue Zosyn and re-draw blood cultures today. Did curbside ID and if speciation is staph aureus will get TEE to rule out endocarditis.    LOS: 9 days   Genella Mech 03/04/2012, 8:07 AM

## 2012-03-04 NOTE — Progress Notes (Addendum)
ANTIBIOTIC CONSULT NOTE - Follow-Up  Pharmacy Consult for Vancomycin and Zosyn Indication:  HCAP; bacterial UTI; CONS bacteremia  No Known Allergies  Patient Measurements: Height: 5\' 8"  (172.7 cm) Weight: 169 lb 15.6 oz (77.1 kg) IBW/kg (Calculated) : 63.9    Vital Signs: Temp: 97.3 F (36.3 C) (03/12 1300) Temp src: Axillary (03/12 0520) BP: 119/59 mmHg (03/12 1300) Pulse Rate: 84  (03/12 1300) Intake/Output from previous day: 03/11 0701 - 03/12 0700 In: -  Out: 2000 [Urine:2000] Intake/Output from this shift: Total I/O In: -  Out: 2000 [Urine:2000]  Labs:  [facility] 03/04/12 1248 03/03/12 0530 03/02/12 1440 03/02/12 0900  WBC 6.7 15.7* 13.2* --  HGB 7.1* 8.8* 9.3* --  PLT PENDING 420* 236 --  LABCREA -- -- -- --  CREATININE 1.37* 1.12* -- 0.80   Estimated Creatinine Clearance: 41.7 ml/min (by C-G formula based on Cr of 1.37).  Assessment: Pt on Vancomycin and Zosyn Day #3 for ?HCAP/bacterial UTI, (no evidence of HCAP but could have chronic aspiration). Now with GPC growing in blood. Fluconazole #6 100mg /d for recurrent candida UTI. Afeb. WBC =15.7. Scr up to 1.37 (baseline 0.8)  with CrCl ~47ml/min but UOP still really good. Vanc trough today 47 (trough drawn appropriately) -SUPRAtherapeutic. About 1/2 of next dose already infused per RN when level resulted - he stopped this dose.  Cultures:  3/3 Bld x4 neg   3/3 Urine cx - >100kcol yeast   3/6 Bld x 2 ngtd   3/9 Bld ngtd   3/10 Bld - GPC - CONS (?Contaminant)   3/12 Bld x 2 - pending  Goal of Therapy:  Vancomycin trough 15-20 mcg/ml  Plan:  Hold vancomycin for now. Will check random level tomorrow ~1300 and restart Vanco if less than 20. Will also f/u creat in a.m. to help guide dosing. Zosyn 3.375 gm IV every 8 hours. F/U culture data and renal function  Christoper Fabian, PharmD, BCPS Clinical pharmacist, pager [phone] 03/04/2012,3:07 PM

## 2012-03-04 NOTE — Progress Notes (Signed)
IM Attending  Visited and discussed with residents.  She is awake but very lethargic and minimally communicative.  Has staph bacteremia.  Likely source is meatal irritation and/or decubiti.  No murmur.  Will ask ID if there is a routine protocol requiring TEE.  Assessment of mental status is limited by her advanced chronic CNS illness (MS).

## 2012-03-04 NOTE — Progress Notes (Signed)
Speech Language/Pathology Speech Pathology: Dysphagia Treatment Note  Patient was observed with : Pureed and Thin liquids.  Patient was noted to have s/s of aspiration : No:    Lung Sounds:  Diminished  Temperature: afebrile  Initially pt somnolent then awakened a bit and accepted ice chips with cues required for swallow response. Then after repositioning for comfort the pt fully awakened with eyes staying open for several minutes. Pt accepted cup sips of water with some anterior spillage but no overt s/s of aspiration. Consumed bits of puree without difficulty.   Clinical Impression: Pt with improving alertness, but still fluctuating. Feel pt likely to tolerate POs if able to consistently maintain alertness. Recommend pt remain NPO today, SLP will recheck in am tomorrow with hopes of initiating diet if alertness is appropriate.   Recommendations:  Continue NPO, SLP will f/u in am tomorrow  Pain:   mild Intervention Required:   Yes Repositioning.   Goals: Goals Partially Met Harlon Ditty, MA CCC-SLP [phone]

## 2012-03-04 NOTE — Progress Notes (Signed)
Nursing Note: Received report that foley was leaking.Re-inflated  fully on first round.A;Checked foley bag and bag was empty but pt had leaked some urine on pad.A; Called on-call and obtained an order to re-insert foley . Noted pink stingy tissue when old foley removed. Pt began to void large incont. Amounts multiple times before new foley was inserted. Pt tolerated well.Emtied foley bag for 900 cc right after pt cleaned up and repositioned.wbb

## 2012-03-05 ENCOUNTER — Inpatient Hospital Stay (HOSPITAL_COMMUNITY)

## 2012-03-05 LAB — CBC
MCH: 25.2 pg — ABNORMAL LOW (ref 26.0–34.0)
MCHC: 32 g/dL (ref 30.0–36.0)
MCV: 78.9 fL (ref 78.0–100.0)
Platelets: 331 10*3/uL (ref 150–400)
RBC: 3.41 MIL/uL — ABNORMAL LOW (ref 3.87–5.11)
RDW: 19.3 % — ABNORMAL HIGH (ref 11.5–15.5)

## 2012-03-05 LAB — BASIC METABOLIC PANEL
Calcium: 9.9 mg/dL (ref 8.4–10.5)
Creatinine, Ser: 0.83 mg/dL (ref 0.50–1.10)
GFR calc non Af Amer: 70 mL/min — ABNORMAL LOW (ref >90)
Glucose, Bld: 95 mg/dL (ref 70–99)
Sodium: 142 mEq/L (ref 135–145)

## 2012-03-05 LAB — TYPE AND SCREEN: ABO/RH(D): O POS

## 2012-03-05 LAB — CULTURE, BLOOD (ROUTINE X 2)
Culture  Setup Time: 201303070121
Culture  Setup Time: 201303070121
Culture: NO GROWTH

## 2012-03-05 MED ORDER — IOHEXOL 300 MG/ML  SOLN
20.0000 mL | INTRAMUSCULAR | Status: AC
  Administered 2012-03-05 (×2): 20 mL via ORAL

## 2012-03-05 MED ORDER — POTASSIUM CHLORIDE CRYS ER 20 MEQ PO TBCR
40.0000 meq | EXTENDED_RELEASE_TABLET | Freq: Four times a day (QID) | ORAL | Status: AC
  Administered 2012-03-05 (×2): 40 meq via ORAL
  Filled 2012-03-05 (×2): qty 2

## 2012-03-05 MED ORDER — FLUCONAZOLE 100 MG PO TABS
100.0000 mg | ORAL_TABLET | Freq: Every day | ORAL | Status: DC
  Administered 2012-03-06 – 2012-03-08 (×3): 100 mg via ORAL
  Filled 2012-03-05 (×3): qty 1

## 2012-03-05 MED ORDER — IOHEXOL 300 MG/ML  SOLN
100.0000 mL | Freq: Once | INTRAMUSCULAR | Status: AC | PRN
  Administered 2012-03-05: 100 mL via INTRAVENOUS

## 2012-03-05 NOTE — Progress Notes (Signed)
Chart review complete. Patient is not clinically appropriate for [facility] Care Management services at this time.  For any additional questions or new referrals please contact Anibal Henderson BSN RN [facility] Liaison at [phone].

## 2012-03-05 NOTE — Progress Notes (Signed)
Speech Language/Pathology Speech Pathology: Dysphagia Treatment Note  Patient was observed with : Pureed, mechanical soft and thin liquids  Patient was noted to have s/s of aspiration : No:    Lung Sounds:  Diminished  Temperature: afebrile Per RN, pt requesting water last pm. Pt aroused easily and maintained arousal and attention to PO throughout session. Consumed straws sips of thin liquids without s/s of aspiration, bites of puree without difficulty. Pt with prolonged mastication of cracker, dry oral mucosa.   Clinical Impression: Pt now able to awaken and maintain appropriate alertness to PO. Pt showing no s/s of aspiration. She may now initiate a pureed diet for energy conservation and thin liquids, to be given only when alert.   Recommendations:  Dys1/thin liquids  Pain:   none Intervention Required:   No   Goals: All Goals Met New goal: Pt will tolerate puree diet with thin liquids without over s/s of aspiration with total assist for consumption and positioning.  Pt will consume trials of mechanical soft solid textures without overt fatigue or residuals for diet upgrade.

## 2012-03-05 NOTE — Progress Notes (Signed)
Subjective: The patient is much more awake today and answering questions well. She has mentioned that she would not like to be on artificial machines to keep her alive if she were to need it to stay alive. Some abdominal tenderness today. No other pain and no discomfort. She is drinking some liquids this morning.   Objective: Vital signs in last 24 hours: Filed Vitals:   03/04/12 1845 03/04/12 2010 03/04/12 2200 03/05/12 0622  BP: 106/48 125/62 133/67 144/63  Pulse: 78 73 82 84  Temp: 97.3 F (36.3 C) 97.4 F (36.3 C) 97.5 F (36.4 C) 97.5 F (36.4 C)  TempSrc: Axillary Axillary Axillary   Resp: 18 18 16 16   Height:      Weight:      SpO2:    100%   Weight change:   Intake/Output Summary (Last 24 hours) at 03/05/12 1147 Last data filed at 03/05/12 1050  Gross per 24 hour  Intake   1600 ml  Output   2800 ml  Net  -1200 ml   Physical Exam: General: awake and able to answer questions HEENT: is able to open her eyes and follows me with them while we talk Cardiac: S 1 S 2 heard, no overt murmurs heard Pulm: good air movement heard bilaterally Abd: lower abdomen was slightly hard, tender to deep palpation, hypoactive BS Ext: warm and well perfused, mild pedal edema, heels are in protective holster to prevent bed sores, no tenderness to palpation Ext: arms seem to be less swollen although she does not move her left hand at all and cannot squeeze my hand, weak squeeze with the right, pulses intact bilaterally and color good and warm  Backside: There are multiple bed sores on her buttocks Neuro: is oriented to person and place but not specific date, knows she is in the hospital   Lab Results: Basic Metabolic Panel:  Lab 03/05/12 1478 03/04/12 1248 03/02/12 0002  NA 142 143 --  K 3.3* 4.1 --  CL 111 112 --  CO2 23 21 --  GLUCOSE 95 82 --  BUN 24* 37* --  CREATININE 0.83 1.37* --  CALCIUM 9.9 10.2 --  MG -- -- 1.9  PHOS -- -- --   CBC:  Lab 03/05/12 0500 03/04/12 1248    WBC 5.8 6.7  NEUTROABS -- --  HGB 8.6* 7.1*  HCT 26.9* 23.1*  MCV 78.9 77.0*  PLT 331 323   Cardiac Enzymes:  Lab 03/01/12 2330  CKTOTAL 8  CKMB 0.9  CKMBINDEX --  TROPONINI <0.30   CBG:  Lab 03/01/12 2242  GLUCAP 125*   Micro Results: Recent Results (from the past 240 hour(s))  CULTURE, BLOOD (ROUTINE X 2)     Status: Normal   Collection Time   02/24/12 12:40 PM      Component Value Range Status Comment   Specimen Description BLOOD ARM RIGHT   Final    Special Requests BOTTLES DRAWN AEROBIC ONLY [facility]   Final    Culture  Setup Time [id number]   Final    Culture NO GROWTH 5 DAYS   Final    Report Status 03/01/2012 FINAL   Final   CULTURE, BLOOD (ROUTINE X 2)     Status: Normal   Collection Time   02/24/12 12:43 PM      Component Value Range Status Comment   Specimen Description BLOOD HAND LEFT   Final    Special Requests BOTTLES DRAWN AEROBIC AND ANAEROBIC 10CC   Final  Culture  Setup Time [id number]   Final    Culture NO GROWTH 5 DAYS   Final    Report Status 03/01/2012 FINAL   Final   URINE CULTURE     Status: Normal   Collection Time   02/24/12  1:14 PM      Component Value Range Status Comment   Specimen Description URINE, CATHETERIZED   Final    Special Requests NONE   Final    Culture  Setup Time [id number]   Final    Colony Count NO GROWTH   Final    Culture NO GROWTH   Final    Report Status 02/25/2012 FINAL   Final   MRSA PCR SCREENING     Status: Normal   Collection Time   02/24/12  7:14 PM      Component Value Range Status Comment   MRSA by PCR NEGATIVE  NEGATIVE  Final   URINE CULTURE     Status: Normal   Collection Time   02/24/12  8:06 PM      Component Value Range Status Comment   Specimen Description URINE, CATHETERIZED   Final    Special Requests NONE   Final    Culture  Setup Time [id number]   Final    Colony Count >=100,000 COLONIES/ML   Final    Culture YEAST   Final    Report Status 02/26/2012 FINAL   Final   CULTURE, BLOOD  (ROUTINE X 2)     Status: Normal   Collection Time   02/24/12  8:10 PM      Component Value Range Status Comment   Specimen Description BLOOD RIGHT HAND   Final    Special Requests BOTTLES DRAWN AEROBIC ONLY 10CC   Final    Culture  Setup Time [id number]   Final    Culture NO GROWTH 5 DAYS   Final    Report Status 03/02/2012 FINAL   Final   CULTURE, BLOOD (ROUTINE X 2)     Status: Normal   Collection Time   02/24/12  8:13 PM      Component Value Range Status Comment   Specimen Description BLOOD LEFT HAND   Final    Special Requests BOTTLES DRAWN AEROBIC ONLY 10CC   Final    Culture  Setup Time [id number]   Final    Culture NO GROWTH 5 DAYS   Final    Report Status 03/02/2012 FINAL   Final   CULTURE, BLOOD (ROUTINE X 2)     Status: Normal   Collection Time   02/27/12  7:33 PM      Component Value Range Status Comment   Specimen Description BLOOD RIGHT HAND   Final    Special Requests BOTTLES DRAWN AEROBIC AND ANAEROBIC 10CC   Final    Culture  Setup Time [id number]   Final    Culture NO GROWTH 5 DAYS   Final    Report Status 03/05/2012 FINAL   Final   CULTURE, BLOOD (ROUTINE X 2)     Status: Normal   Collection Time   02/27/12  7:47 PM      Component Value Range Status Comment   Specimen Description BLOOD LEFT HAND   Final    Special Requests BOTTLES DRAWN AEROBIC AND ANAEROBIC 10CC   Final    Culture  Setup Time [id number]   Final    Culture NO GROWTH 5 DAYS   Final    Report Status 03/05/2012 FINAL   Final  CULTURE, BLOOD (ROUTINE X 2)     Status: Normal (Preliminary result)   Collection Time   03/01/12 12:29 AM      Component Value Range Status Comment   Specimen Description BLOOD LEFT HAND   Final    Special Requests BOTTLES DRAWN AEROBIC ONLY 5CC   Final    Culture  Setup Time [id number]   Final    Culture     Final    Value:        BLOOD CULTURE RECEIVED NO GROWTH TO DATE CULTURE WILL BE HELD FOR 5 DAYS BEFORE ISSUING A FINAL NEGATIVE REPORT   Report Status PENDING    Incomplete   CULTURE, BLOOD (ROUTINE X 2)     Status: Normal   Collection Time   03/02/12 12:29 AM      Component Value Range Status Comment   Specimen Description BLOOD RIGHT ARM   Final    Special Requests     Final    Value: BOTTLES DRAWN AEROBIC AND ANAEROBIC 10CC BLUE,5CC RED   Culture  Setup Time [id number]   Final    Culture     Final    Value: STAPHYLOCOCCUS SPECIES (COAGULASE NEGATIVE)     Note: THE SIGNIFICANCE OF ISOLATING THIS ORGANISM FROM A SINGLE VENIPUNCTURE CANNOT BE PREDICTED WITHOUT FURTHER CLINICAL AND CULTURE CORRELATION. SUSCEPTIBILITIES AVAILABLE ONLY ON REQUEST.     Note: Gram Stain Report Called to,Read Back By and Verified With: WILLIE COLLINS 03/03/12 @1326  BY KRAWS   Report Status 03/04/2012 FINAL   Final   URINE CULTURE     Status: Normal   Collection Time   03/02/12  3:51 AM      Component Value Range Status Comment   Specimen Description URINE, CATHETERIZED   Final    Special Requests NONE   Final    Culture  Setup Time [id number]   Final    Colony Count NO GROWTH   Final    Culture NO GROWTH   Final    Report Status 03/03/2012 FINAL   Final   CULTURE, BLOOD (ROUTINE X 2)     Status: Normal (Preliminary result)   Collection Time   03/04/12 12:45 PM      Component Value Range Status Comment   Specimen Description BLOOD LEFT ARM   Final    Special Requests BOTTLES DRAWN AEROBIC AND ANAEROBIC 10CC   Final    Culture  Setup Time [id number]   Final    Culture     Final    Value:        BLOOD CULTURE RECEIVED NO GROWTH TO DATE CULTURE WILL BE HELD FOR 5 DAYS BEFORE ISSUING A FINAL NEGATIVE REPORT   Report Status PENDING   Incomplete   CULTURE, BLOOD (ROUTINE X 2)     Status: Normal (Preliminary result)   Collection Time   03/04/12  1:02 PM      Component Value Range Status Comment   Specimen Description BLOOD LEFT ARM   Final    Special Requests BOTTLES DRAWN AEROBIC AND ANAEROBIC 10CC   Final    Culture  Setup Time [id number]   Final    Culture      Final    Value:        BLOOD CULTURE RECEIVED NO GROWTH TO DATE CULTURE WILL BE HELD FOR 5 DAYS BEFORE ISSUING A FINAL NEGATIVE REPORT   Report Status PENDING   Incomplete    Studies/Results: No results found. Medications: I have reviewed  the patient's current medications. Scheduled Meds:    . amitriptyline  100 mg Oral QHS  . collagenase   Topical Daily  . docusate sodium  100 mg Oral BID  . feeding supplement  237 mL Oral TID BM  . fluconazole  100 mg Oral Daily  . FLUoxetine  40 mg Oral Daily  . mulitivitamin with minerals  1 tablet Oral Daily  . oxybutynin  5 mg Oral BID  . polyethylene glycol  17 g Oral Daily  . sucralfate  1 g Oral TID WC & HS  . DISCONTD: fluconazole (DIFLUCAN) IV  100 mg Intravenous Q24H  . DISCONTD: heparin  5,000 Units Subcutaneous Q8H  . DISCONTD: piperacillin-tazobactam (ZOSYN)  IV  3.375 g Intravenous Q8H  . DISCONTD: vancomycin  1,000 mg Intravenous Q12H   Continuous Infusions:    . sodium chloride 10 mL/hr at 03/02/12 2245  . sodium chloride 125 mL/hr at 03/04/12 2208   PRN Meds:.acetaminophen, acetaminophen, ondansetron (ZOFRAN) IV, ondansetron, oxyCODONE Assessment/Plan:  UTI (lower urinary tract infection) - Have switched her back to PO diflucan and will continue for total of 14 days of treatment. Today is day 8 of treatment so will need additional 6 days of treatment. Repeat urine culture is negative.   Fever - Pt has been afebrile for >24 hours and will continue current treatment. D/Ced zosyn today and seems likely etiology was bacteremia but will be very careful with feeding only when awake and alert to prevent aspiration in the future.    Bacteremia - Speciation was coagulase negative staff so are treating with 7 days of vancomycin per ID recommendations. As her level of vancomycin was supratherapeutic will hold dosing and consider treatment completed as it will be therapeutic for several more days. Will note clearly in D/C summary that  if recurrent coag neg bacteremia may consider TEE to evaluate for endocarditis.   PRESSURE ULCER HIP -  Wound care consult to care for her decubitus ulcers on her backside. Heels are in protective holsters to prevent ulcer.    Normocytic anemia - Likely due to chronic disease and appears to be stable around 9 although we have been watching closely as she was having incident of vomiting blood. Did receive one unit of blood yesterday and blood counts are stable. Possibly due to the hematemesis and now resolved.    Leukocytosis - Resolved and treated appropriately. See above.   AKI - Resolving, Cr today is 0.86 from high of 1.37 yesterday. Likely overload of vancomycin and have now stopped.   Metabolic encephalopathy - Is now back at baseline and stable.  Disposition - Will stop vancomycin and zosyn today after discussion with ID. Will not need further vancomycin dosing for bacteremia. Will discharge home to SNF tomorrow if blood counts are stable. Will get abdominal CT today to evaluate abdomen.     LOS: 10 days   Genella Mech 03/05/2012, 11:47 AM

## 2012-03-05 NOTE — Progress Notes (Signed)
IM Attending  She is much improved today.  Awake and speaking.  She answers some questions, and is oriented.  Afebrile.  discussed coag neg staph bacteremia with Dr. Drue Second.  Has improved while on vanc and zosyn.  Creatinine rose at time of elevated vanc level and has fallen since.  Probably will not need further vanc dosing.  Hgb drifted slightly below chronic disease baseline but she is guiaic negative.  She may be able to return to [facility] tomorrow.

## 2012-03-05 NOTE — Clinical Documentation Improvement (Signed)
PRESSURE ULCER DOCUMENTATION CLARIFICATION QUERY  THIS DOCUMENT IS NOT A PERMANENT PART OF THE MEDICAL RECORD  TO RESPOND TO THE THIS QUERY, FOLLOW THE INSTRUCTIONS BELOW:  1. If needed, update documentation for the patient's encounter via the notes activity.  2. Access this query again and click edit on the In [employer].  3. After updating, or not, click F2 to complete all highlighted (required) fields concerning your review. Select "additional documentation in the medical record" OR "no additional documentation provided".  4. Click Sign note button.  5. The deficiency will fall out of your In Basket *Please let us know if you are not able to complete this workflow by phone or e-mail (listed below).  Please update your documentation within the medical record to reflect your response to this query.                                                                                    03/05/12  Dear Dr. Kathie Rhodes. [facility]  In a better effort to capture your patient's severity of illness, reflect appropriate length of stay and utilization of resources, a review of the patient medical record has revealed the following indicators.    Based on your clinical judgment, please clarify and document in a progress note and/or discharge summary the clinical condition associated with the following supporting information:   Per WOC nurse consult "Sacral wound is Stage III pressure ulcer. Right buttock superior wound is Stage III pressure ulcer". If you feel that this is an accurate description of the patient's decubiti please document the sites and stages in your progress notes/ and or d/c summary  so that it may reflect the patient's condition upon admission to the hospital . Please also state if these decubiti were present on admission in your notes/ and or discharge summary if appropriate as this information can only be captured  from MD documentation and not the [facility] nurse consult note.  Thank you                           Possible Clinical Conditions?   _______Stage  I  Pressure Ulcer   (reddening of the skin) _______Stage  II Pressure Ulcer  (blister open or unopened) _______Stage  III Pressure Ulcer (through all layers skin) _______Stage IV Pressure Ulcer   (through skin & underlying  muscle, tendons, and bones) _______Other Condition_____________ _______Cannot Clinically Determine    Supporting Information: sepsis/ staph bacteremia , age, metabolic encephalopathy   Wound Nurse Assessment (WOC): "Sacral wound is Stage III pressure ulcer. Right buttock superior wound is Stage III pressure ulcer, inferior wound is unstageable pressure ulcer. " Pressure Ulcer POA: "Yes"    Treatment: "Placed silicone dressing to L ankle will add enzymatic debridement ointment to wound to be applied daily. Sacrum and right Stage III pressure ulcers will be treated with enzymatic debridement ointment covered with silicone dressing to be changed daily. Unstageable pressure ulcer on right buttock will be monitored for evolution and will be covered with silicone dressing without enzymatic debridement ointment."     Reviewed: additional documentation in the medical record  Thank You,  Leonette Most  [place]  Clinical Documentation Specialist RN, BSN:  Pager [phone] HIM off [phone]  Health Information Management [place]  We did document that in her discharge summary. I discussed that with DR. Dorise Hiss.  Thank you, Brinley Rosete

## 2012-03-06 ENCOUNTER — Inpatient Hospital Stay (HOSPITAL_COMMUNITY)

## 2012-03-06 LAB — CBC
Platelets: 363 10*3/uL (ref 150–400)
RBC: 3.81 MIL/uL — ABNORMAL LOW (ref 3.87–5.11)
RDW: 19.5 % — ABNORMAL HIGH (ref 11.5–15.5)
WBC: 9.6 10*3/uL (ref 4.0–10.5)

## 2012-03-06 LAB — BASIC METABOLIC PANEL
CO2: 22 mEq/L (ref 19–32)
Calcium: 9.6 mg/dL (ref 8.4–10.5)
Creatinine, Ser: 0.52 mg/dL (ref 0.50–1.10)
GFR calc Af Amer: >90 mL/min (ref >90)
GFR calc non Af Amer: >90 mL/min (ref >90)
Sodium: 135 mEq/L (ref 135–145)

## 2012-03-06 MED ORDER — IOHEXOL 300 MG/ML  SOLN
100.0000 mL | Freq: Once | INTRAMUSCULAR | Status: AC | PRN
  Administered 2012-03-06: 100 mL via INTRAVENOUS

## 2012-03-06 NOTE — Consult Note (Addendum)
Urology Consult   Physician requesting consult: Aundria Rud  Reason for consult: Ruptured bladder  History of Present Illness: [name] [name] is a 71 y.o. female with PMH significant for MS, pneumonia, recurrent UTIs, and chronic foley who has been hospitalized since 02/24/12 secondary to bacteremia and metabolic encephalopathy.  Urine cx 02/24/12 revealed yeast and her most recent culture on 03/02/12 was negative.  She is currently completing a course of Diflucan.   Her confusion has greatly improved and she is able to answer questions appropriately but her daughter states that her mental status is not completely back to baseline.  Per her daughter, the pt began to complain of lower abdominal pain approx 3 weeks ago which was thought to be due to a UTI.  On a recent physical exam a hard mass was palpated in the lower abd which prompted a CT scan.  This revealed extraperitoneal bladder rupture with associated defect noted and a 3 x 7cm left pelvic complex fluid collection.  The pt currently has a 19f foley in place which is draining clear/yellow urine.    She currently denies abdominal pain.  She is afebrile with stable vitals signs and her WBC is WNL.  She denies CP, SOB, N/V, and diarrhea/constipation.   Past Medical History  Diagnosis Date  . Multiple sclerosis   . GERD (gastroesophageal reflux disease)   . Depression   . Recurrent UTI   . Recurrent pneumonia   . Chronic constipation   . Diverticulosis     Past Surgical History  Procedure Date  . Cholecystectomy   . Orif humeral shaft fracture 01/2011  . Video assisted thoracoscopy 06/2010  . Closed reduction shoulder dislocation 04/2010    Fracture dislocation  . Femur im nail 01/13/2012    Procedure: INTRAMEDULLARY (IM) RETROGRADE FEMORAL NAILING;  Surgeon: Thera Flake., MD;  Location: [facility] OR;  Service: Orthopedics;  Laterality: Left;  . Femoral artery exploration 01/13/2012    Procedure: FEMORAL ARTERY EXPLORATION;  Surgeon: Pryor Ochoa,  MD;  Location: [facility] OR;  Service: Vascular;  Laterality: Left;     Current Hospital Medications: Scheduled Meds:   . amitriptyline  100 mg Oral QHS  . collagenase   Topical Daily  . docusate sodium  100 mg Oral BID  . feeding supplement  237 mL Oral TID BM  . fluconazole  100 mg Oral Daily  . FLUoxetine  40 mg Oral Daily  . mulitivitamin with minerals  1 tablet Oral Daily  . oxybutynin  5 mg Oral BID  . polyethylene glycol  17 g Oral Daily  . potassium chloride  40 mEq Oral Q6H  . sucralfate  1 g Oral TID WC & HS   Continuous Infusions:   . sodium chloride 10 mL/hr at 03/02/12 2245  . sodium chloride 125 mL/hr at 03/06/12 0651   PRN Meds:.acetaminophen, acetaminophen, iohexol, iohexol, ondansetron (ZOFRAN) IV, ondansetron, oxyCODONE  Prior to Admission medications   Medication Sig Start Date End Date Taking? Authorizing Provider  amitriptyline (ELAVIL) 100 MG tablet Take 100 mg by mouth at bedtime.   Yes Historical Provider, MD  bisacodyl (DULCOLAX) 5 MG EC tablet Take 2 tablets (10 mg total) by mouth daily as needed. 02/18/12 03/19/12 Yes Kathlen Mody, MD  docusate sodium (COLACE) 100 MG capsule Take 200 mg by mouth 2 (two) times daily.     Yes Historical Provider, MD  feeding supplement (ENSURE) PUDG Take 1 Container by mouth 2 (two) times daily between meals. 02/18/12  Yes Vijaya  Blake Divine, MD  feeding supplement (PRO-STAT SUGAR FREE 64) LIQD Take 30 mLs by mouth 2 (two) times daily.   Yes Historical Provider, MD  ferrous sulfate 325 (65 FE) MG tablet Take 1 tablet (325 mg total) by mouth 2 (two) times daily with a meal. 02/12/12 02/11/13 Yes Christina P Rama, MD  FLUoxetine (PROZAC) 40 MG capsule Take 40 mg by mouth daily.     Yes Historical Provider, MD  gabapentin (NEURONTIN) 300 MG capsule Take 2 capsules (600 mg total) by mouth 3 (three) times daily. 02/18/12 02/17/13 Yes Kathlen Mody, MD  Multiple Vitamins-Minerals (MULTIVITAMINS THER. W/MINERALS) TABS Take 1 tablet by mouth daily.      Yes Historical Provider, MD  naproxen sodium (ANAPROX) 275 MG tablet Take 275 mg by mouth 2 (two) times daily with a meal.   Yes Historical Provider, MD  omeprazole (PRILOSEC) 20 MG capsule Take 20 mg by mouth 2 (two) times daily.     Yes Historical Provider, MD  oxyCODONE (OXY IR/ROXICODONE) 5 MG immediate release tablet Take 1 tablet (5 mg total) by mouth every 4 (four) hours as needed for pain. pain 02/18/12  Yes Kathlen Mody, MD  vitamin C (ASCORBIC ACID) 500 MG tablet Take 500 mg by mouth 2 (two) times daily.     Yes Historical Provider, MD    Allergies: No Known Allergies  History reviewed. No pertinent family history.  Social History:  reports that she has never smoked. She has never used smokeless tobacco. She reports that she does not drink alcohol or use illicit drugs.  ROS: A complete review of systems was performed.  All systems are negative except for pertinent findings as noted.  Physical Exam:  Vital signs in last 24 hours: Temp:  [97.8 F (36.6 C)-98 F (36.7 C)] 98 F (36.7 C) (03/14 0530) Pulse Rate:  [89] 89  (03/14 0530) Resp:  [18-20] 20  (03/14 0530) BP: (127-138)/(62-75) 127/75 mmHg (03/14 0530) SpO2:  [98 %-100 %] 100 % (03/14 0530) General:  Alert and oriented to person and place, No acute distress HEENT: Normocephalic, atraumatic Neck: No JVD or lymphadenopathy Cardiovascular: Regular rate and rhythm Lungs: decreased BS bases Abdomen: Soft, ND; mildly tender to palp over lower abd/suprapubic area; there is a hardened immobile mass noted in the LLQ and suprapubic areas.   Extremities: No edema Neurologic: Grossly intact; bilateral upper and lower ext weakness  Laboratory Data:   Basename 03/06/12 1201 03/05/12 0500 03/04/12 1248  WBC 9.6 5.8 6.7  HGB 9.5* 8.6* 7.1*  HCT 29.5* 26.9* 23.1*  PLT 363 331 323     Basename 03/06/12 0630 03/05/12 0500 03/04/12 1248  NA 135 142 143  K 5.0 3.3* 4.1  CL 106 111 112  GLUCOSE 82 95 82  BUN 12 24* 37*    CALCIUM 9.6 9.9 10.2  CREATININE 0.52 0.83 1.37*     Results for orders placed during the hospital encounter of 02/24/12 (from the past 24 hour(s))  BASIC METABOLIC PANEL     Status: Normal   Collection Time   03/06/12  6:30 AM      Component Value Range   Sodium 135  135 - 145 (mEq/L)   Potassium 5.0  3.5 - 5.1 (mEq/L)   Chloride 106  96 - 112 (mEq/L)   CO2 22  19 - 32 (mEq/L)   Glucose, Bld 82  70 - 99 (mg/dL)   BUN 12  6 - 23 (mg/dL)   Creatinine, Ser 1.61  0.50 -  1.10 (mg/dL)   Calcium 9.6  8.4 - 82.9 (mg/dL)   GFR calc non Af Amer >90  >90 (mL/min)   GFR calc Af Amer >90  >90 (mL/min)  CBC     Status: Abnormal   Collection Time   03/06/12 12:01 PM      Component Value Range   WBC 9.6  4.0 - 10.5 (K/uL)   RBC 3.81 (*) 3.87 - 5.11 (MIL/uL)   Hemoglobin 9.5 (*) 12.0 - 15.0 (g/dL)   HCT 56.2 (*) 13.0 - 46.0 (%)   MCV 77.4 (*) 78.0 - 100.0 (fL)   MCH 24.9 (*) 26.0 - 34.0 (pg)   MCHC 32.2  30.0 - 36.0 (g/dL)   RDW 86.5 (*) 78.4 - 15.5 (%)   Platelets 363  150 - 400 (K/uL)   Recent Results (from the past 240 hour(s))  CULTURE, BLOOD (ROUTINE X 2)     Status: Normal   Collection Time   02/27/12  7:33 PM      Component Value Range Status Comment   Specimen Description BLOOD RIGHT HAND   Final    Special Requests BOTTLES DRAWN AEROBIC AND ANAEROBIC 10CC   Final    Culture  Setup Time [id number]   Final    Culture NO GROWTH 5 DAYS   Final    Report Status 03/05/2012 FINAL   Final   CULTURE, BLOOD (ROUTINE X 2)     Status: Normal   Collection Time   02/27/12  7:47 PM      Component Value Range Status Comment   Specimen Description BLOOD LEFT HAND   Final    Special Requests BOTTLES DRAWN AEROBIC AND ANAEROBIC 10CC   Final    Culture  Setup Time [id number]   Final    Culture NO GROWTH 5 DAYS   Final    Report Status 03/05/2012 FINAL   Final   CULTURE, BLOOD (ROUTINE X 2)     Status: Normal (Preliminary result)   Collection Time   03/01/12 12:29 AM      Component Value  Range Status Comment   Specimen Description BLOOD LEFT HAND   Final    Special Requests BOTTLES DRAWN AEROBIC ONLY 5CC   Final    Culture  Setup Time [id number]   Final    Culture     Final    Value:        BLOOD CULTURE RECEIVED NO GROWTH TO DATE CULTURE WILL BE HELD FOR 5 DAYS BEFORE ISSUING A FINAL NEGATIVE REPORT   Report Status PENDING   Incomplete   CULTURE, BLOOD (ROUTINE X 2)     Status: Normal   Collection Time   03/02/12 12:29 AM      Component Value Range Status Comment   Specimen Description BLOOD RIGHT ARM   Final    Special Requests     Final    Value: BOTTLES DRAWN AEROBIC AND ANAEROBIC 10CC BLUE,5CC RED   Culture  Setup Time [id number]   Final    Culture     Final    Value: STAPHYLOCOCCUS SPECIES (COAGULASE NEGATIVE)     Note: THE SIGNIFICANCE OF ISOLATING THIS ORGANISM FROM A SINGLE VENIPUNCTURE CANNOT BE PREDICTED WITHOUT FURTHER CLINICAL AND CULTURE CORRELATION. SUSCEPTIBILITIES AVAILABLE ONLY ON REQUEST.     Note: Gram Stain Report Called to,Read Back By and Verified With: [facility] 03/03/12 @1326  BY KRAWS   Report Status 03/04/2012 FINAL   Final   URINE CULTURE     Status:  Normal   Collection Time   03/02/12  3:51 AM      Component Value Range Status Comment   Specimen Description URINE, CATHETERIZED   Final    Special Requests NONE   Final    Culture  Setup Time [id number]   Final    Colony Count NO GROWTH   Final    Culture NO GROWTH   Final    Report Status 03/03/2012 FINAL   Final   CULTURE, BLOOD (ROUTINE X 2)     Status: Normal (Preliminary result)   Collection Time   03/04/12 12:45 PM      Component Value Range Status Comment   Specimen Description BLOOD LEFT ARM   Final    Special Requests BOTTLES DRAWN AEROBIC AND ANAEROBIC 10CC   Final    Culture  Setup Time [id number]   Final    Culture     Final    Value:        BLOOD CULTURE RECEIVED NO GROWTH TO DATE CULTURE WILL BE HELD FOR 5 DAYS BEFORE ISSUING A FINAL NEGATIVE REPORT   Report  Status PENDING   Incomplete   CULTURE, BLOOD (ROUTINE X 2)     Status: Normal (Preliminary result)   Collection Time   03/04/12  1:02 PM      Component Value Range Status Comment   Specimen Description BLOOD LEFT ARM   Final    Special Requests BOTTLES DRAWN AEROBIC AND ANAEROBIC 10CC   Final    Culture  Setup Time [id number]   Final    Culture     Final    Value:        BLOOD CULTURE RECEIVED NO GROWTH TO DATE CULTURE WILL BE HELD FOR 5 DAYS BEFORE ISSUING A FINAL NEGATIVE REPORT   Report Status PENDING   Incomplete     Renal Function:  Basename 03/06/12 0630 03/05/12 0500 03/04/12 1248 03/03/12 0530 03/02/12 0900 03/02/12 0002 03/01/12 0630  CREATININE 0.52 0.83 1.37* 1.12* 0.80 0.84 0.49*   Estimated Creatinine Clearance: 71.5 ml/min (by C-G formula based on Cr of 0.52).  Radiologic Imaging: Ct Abdomen Pelvis W Contrast  03/05/2012  *RADIOLOGY REPORT*  Clinical Data: Abdominal pain  CT ABDOMEN AND PELVIS WITH CONTRAST  Technique:  Multidetector CT imaging of the abdomen and pelvis was performed following the standard protocol during bolus administration of intravenous contrast.  Contrast: OMNIPAQUE IOHEXOL 300 MG/ML IJ SOLN, 1 OMNIPAQUE IOHEXOL 300 MG/ML IJ SOLN  Comparison: None.  Findings: Tiny bilateral pleural effusions with compressive atelectasis posteriorly in the visualized lung bases.  Unremarkable liver, spleen, adrenal glands.  Mild pancreatic parenchymal atrophy without focal lesion.  Probable cyst at the lower pole of the right kidney.  No hydronephrosis.  Stomach and small bowel nondilated. Normal appendix.  The colon is nondistended.  There is a Foley catheter in the urinary bladder which is incompletely distended with circumferential somewhat irregular bladder wall thickening.  There is a defect in the left anterior wall of the bladder with extraperitoneal bladder rupture, and gas and fluid in   scattered pockets through the space of Retzius, confirmed on delayed scans  with contrast extending from the lumen of the bladder through the bladder wall defect into the collections.  There is a 3.2 x 7.6 cm left pelvic fluid collection which appears loculated with a thick wall.  There is heavily calcified degenerated uterine fibroid.  There is a small amount of free fluid in the pelvis.  There is  no free air.  Fixation hardware is noted in the proximal left femur.  Advanced facet degenerative changes in the lower lumbar spine.  No pelvic, retroperitoneal, or mesenteric adenopathy.  Portal vein patent.  IMPRESSION:  1.  Extraperitoneal bladder rupture with associated defect noted in the otherwise thick-walled urinary bladder.  Recommend urologic consultation. 2.  3.2 x 7.6 cm left pelvic complex fluid collection.  Original Report Authenticated By: Osa Craver, M.D.    Dr Vernie Ammons and I have independently reviewed the above images.  Impression/Assessment:   Extraperitoneal bladder rupture-- unknown etiology with possible causes including malignancy, infection, and overdistention due to poorly draining foley.  It is unclear if this issue is acute or subacute.  Candidiasis  Plan:  Will exchange current foley for 74f to ensure proper drainage.  Can irrigate foley prn.  CT Cystogram today  Pt will need cystoscopy as well.   Complete current tx of Candidiasis.  Silas Flood 03/06/2012, 2:42 PM   Addendum: This patient with a long-standing Foley catheter has a very thick walled bladder which is likely due in part to the fact that she has a neurogenic bladder secondary to her MS but also due to inflammation from her bladder infection. When I reviewed her CT scan initially that revealed the extraperitoneal fluid collection that appeared to be associated with a defect in the left wall of the bladder I noted that the Foley catheter was in place in the bladder however the bladder was not decompressed. With a functioning Foley catheter the bladder should be  decompressed and this should also allow the fluid which was noted to be present in the area of extravasation to be drained. Neither of these was the case and I felt there was a high likelihood that the catheter was not draining properly.  Further evaluation with a CT cystogram has revealed extravasation from a defect in the bladder wall. Typically an extraperitoneal bladder rupture will spontaneously resolve with Foley catheter drainage however with inadequate Foley catheter drainage this could persist. She was found to have in fact a 14 French Foley catheter which was likely contributing to the nonhealing of this area. My recommendation was to replace the 75 French catheter with a much larger bore catheter that would allow better drainage and be less prone to occlusion from the presence of debris which is very likely to be present in this patient. With adequate drainage from the bladder the patient may not need any form of percutaneous drainage.  The cause of this rupture remains obscure. There was no definite history of prolonged retention/distention of the bladder and certainly no trauma that occurred to the patient that could have caused this. One possibility is that of neoplasm which is not been ruled out. Cystoscopy would be a consideration however continued catheter drainage with monitoring to prevent occlusion of the catheter and adequate coverage for yeast and bacterial infection may be all that is necessary in this patient with significant comorbidities.  I have discussed this with Dr. Patsi Sears who has seen the patient previously and will be following up.

## 2012-03-06 NOTE — Progress Notes (Signed)
IM Attending  Continued lower abdominal tenderness led to CT (at Dr. Malon Kindle suggestion) and surprise finding of ruptured bladder.  We have consulted urology and will abide by their advice.  She looks remarkably well and is more alert and engaging than before.  Temp nl; BP and O2 stable.  She denies any pain.  Creat down to .52.  Awaiting today's CBC.  Discussed events and plans with patient and her daughter.

## 2012-03-06 NOTE — Progress Notes (Signed)
New foley catheter, 22 french, was inserted per MD order. Pt tolerated well. Tammy Sours

## 2012-03-06 NOTE — Progress Notes (Signed)
Subjective: She is sleeping when I enter but is able to wake up. A little sleepy today. Denies pain anywhere.   Objective: Vital signs in last 24 hours: Filed Vitals:   03/05/12 0622 03/05/12 1402 03/05/12 2238 03/06/12 0530  BP: 144/63 147/76 138/62 127/75  Pulse: 84 87 89 89  Temp: 97.5 F (36.4 C) 97.9 F (36.6 C) 97.8 F (36.6 C) 98 F (36.7 C)  TempSrc:    Oral  Resp: 16 16 18 20   Height:      Weight:      SpO2: 100% 99% 98% 100%   Weight change:   Intake/Output Summary (Last 24 hours) at 03/06/12 0753 Last data filed at 03/06/12 0532  Gross per 24 hour  Intake      0 ml  Output   4100 ml  Net  -4100 ml   Physical Exam: General: sleeping but able to wake up and answer questions HEENT: is able to open her eyes and follows me with them while we talk Cardiac: S 1 S 2 heard, no overt murmurs heard Pulm: good air movement heard bilaterally Abd: lower abdomen was slightly hard,non-tender to deep palpation, hypoactive BS Ext: warm and well perfused, mild pedal edema, heels are in protective holster to prevent bed sores, no tenderness to palpation Ext: arms seem to be less swollen although she does not move her left hand at all and cannot squeeze my hand, weak squeeze with the right, pulses intact bilaterally and color good and warm  Backside: There are multiple bed sores on her buttocks Neuro: is oriented to person and place but not specific date, knows she is in the hospital   Lab Results: Basic Metabolic Panel:  Lab 03/06/12 1610 03/05/12 0500 03/02/12 0002  NA 135 142 --  K 5.0 3.3* --  CL 106 111 --  CO2 22 23 --  GLUCOSE 82 95 --  BUN 12 24* --  CREATININE 0.52 0.83 --  CALCIUM 9.6 9.9 --  MG -- -- 1.9  PHOS -- -- --   CBC:  Lab 03/05/12 0500 03/04/12 1248  WBC 5.8 6.7  NEUTROABS -- --  HGB 8.6* 7.1*  HCT 26.9* 23.1*  MCV 78.9 77.0*  PLT 331 323   Cardiac Enzymes:  Lab 03/01/12 2330  CKTOTAL 8  CKMB 0.9  CKMBINDEX --  TROPONINI <0.30    CBG:  Lab 03/01/12 2242  GLUCAP 125*   Micro Results: Recent Results (from the past 240 hour(s))  CULTURE, BLOOD (ROUTINE X 2)     Status: Normal   Collection Time   02/27/12  7:33 PM      Component Value Range Status Comment   Specimen Description BLOOD RIGHT HAND   Final    Special Requests BOTTLES DRAWN AEROBIC AND ANAEROBIC 10CC   Final    Culture  Setup Time [id number]   Final    Culture NO GROWTH 5 DAYS   Final    Report Status 03/05/2012 FINAL   Final   CULTURE, BLOOD (ROUTINE X 2)     Status: Normal   Collection Time   02/27/12  7:47 PM      Component Value Range Status Comment   Specimen Description BLOOD LEFT HAND   Final    Special Requests BOTTLES DRAWN AEROBIC AND ANAEROBIC 10CC   Final    Culture  Setup Time [id number]   Final    Culture NO GROWTH 5 DAYS   Final    Report Status 03/05/2012 FINAL  Final   CULTURE, BLOOD (ROUTINE X 2)     Status: Normal (Preliminary result)   Collection Time   03/01/12 12:29 AM      Component Value Range Status Comment   Specimen Description BLOOD LEFT HAND   Final    Special Requests BOTTLES DRAWN AEROBIC ONLY 5CC   Final    Culture  Setup Time [id number]   Final    Culture     Final    Value:        BLOOD CULTURE RECEIVED NO GROWTH TO DATE CULTURE WILL BE HELD FOR 5 DAYS BEFORE ISSUING A FINAL NEGATIVE REPORT   Report Status PENDING   Incomplete   CULTURE, BLOOD (ROUTINE X 2)     Status: Normal   Collection Time   03/02/12 12:29 AM      Component Value Range Status Comment   Specimen Description BLOOD RIGHT ARM   Final    Special Requests     Final    Value: BOTTLES DRAWN AEROBIC AND ANAEROBIC 10CC BLUE,5CC RED   Culture  Setup Time [id number]   Final    Culture     Final    Value: STAPHYLOCOCCUS SPECIES (COAGULASE NEGATIVE)     Note: THE SIGNIFICANCE OF ISOLATING THIS ORGANISM FROM A SINGLE VENIPUNCTURE CANNOT BE PREDICTED WITHOUT FURTHER CLINICAL AND CULTURE CORRELATION. SUSCEPTIBILITIES AVAILABLE ONLY ON REQUEST.      Note: Gram Stain Report Called to,Read Back By and Verified With: WILLIE COLLINS 03/03/12 @1326  BY KRAWS   Report Status 03/04/2012 FINAL   Final   URINE CULTURE     Status: Normal   Collection Time   03/02/12  3:51 AM      Component Value Range Status Comment   Specimen Description URINE, CATHETERIZED   Final    Special Requests NONE   Final    Culture  Setup Time [id number]   Final    Colony Count NO GROWTH   Final    Culture NO GROWTH   Final    Report Status 03/03/2012 FINAL   Final   CULTURE, BLOOD (ROUTINE X 2)     Status: Normal (Preliminary result)   Collection Time   03/04/12 12:45 PM      Component Value Range Status Comment   Specimen Description BLOOD LEFT ARM   Final    Special Requests BOTTLES DRAWN AEROBIC AND ANAEROBIC 10CC   Final    Culture  Setup Time [id number]   Final    Culture     Final    Value:        BLOOD CULTURE RECEIVED NO GROWTH TO DATE CULTURE WILL BE HELD FOR 5 DAYS BEFORE ISSUING A FINAL NEGATIVE REPORT   Report Status PENDING   Incomplete   CULTURE, BLOOD (ROUTINE X 2)     Status: Normal (Preliminary result)   Collection Time   03/04/12  1:02 PM      Component Value Range Status Comment   Specimen Description BLOOD LEFT ARM   Final    Special Requests BOTTLES DRAWN AEROBIC AND ANAEROBIC 10CC   Final    Culture  Setup Time [id number]   Final    Culture     Final    Value:        BLOOD CULTURE RECEIVED NO GROWTH TO DATE CULTURE WILL BE HELD FOR 5 DAYS BEFORE ISSUING A FINAL NEGATIVE REPORT   Report Status PENDING   Incomplete    Studies/Results: Ct Abdomen Pelvis W Contrast  03/05/2012  *RADIOLOGY REPORT*  Clinical Data: Abdominal pain  CT ABDOMEN AND PELVIS WITH CONTRAST  Technique:  Multidetector CT imaging of the abdomen and pelvis was performed following the standard protocol during bolus administration of intravenous contrast.  Contrast: OMNIPAQUE IOHEXOL 300 MG/ML IJ SOLN, 1 OMNIPAQUE IOHEXOL 300 MG/ML IJ SOLN  Comparison: None.   Findings: Tiny bilateral pleural effusions with compressive atelectasis posteriorly in the visualized lung bases.  Unremarkable liver, spleen, adrenal glands.  Mild pancreatic parenchymal atrophy without focal lesion.  Probable cyst at the lower pole of the right kidney.  No hydronephrosis.  Stomach and small bowel nondilated. Normal appendix.  The colon is nondistended.  There is a Foley catheter in the urinary bladder which is incompletely distended with circumferential somewhat irregular bladder wall thickening.  There is a defect in the left anterior wall of the bladder with extraperitoneal bladder rupture, and gas and fluid in   scattered pockets through the space of Retzius, confirmed on delayed scans with contrast extending from the lumen of the bladder through the bladder wall defect into the collections.  There is a 3.2 x 7.6 cm left pelvic fluid collection which appears loculated with a thick wall.  There is heavily calcified degenerated uterine fibroid.  There is a small amount of free fluid in the pelvis.  There is no free air.  Fixation hardware is noted in the proximal left femur.  Advanced facet degenerative changes in the lower lumbar spine.  No pelvic, retroperitoneal, or mesenteric adenopathy.  Portal vein patent.  IMPRESSION:  1.  Extraperitoneal bladder rupture with associated defect noted in the otherwise thick-walled urinary bladder.  Recommend urologic consultation. 2.  3.2 x 7.6 cm left pelvic complex fluid collection.  Original Report Authenticated By: Osa Craver, M.D.   Medications: I have reviewed the patient's current medications. Scheduled Meds:    . amitriptyline  100 mg Oral QHS  . collagenase   Topical Daily  . docusate sodium  100 mg Oral BID  . feeding supplement  237 mL Oral TID BM  . fluconazole  100 mg Oral Daily  . FLUoxetine  40 mg Oral Daily  . iohexol  20 mL Oral Q1 Hr x 2  . mulitivitamin with minerals  1 tablet Oral Daily  . oxybutynin  5 mg Oral  BID  . polyethylene glycol  17 g Oral Daily  . potassium chloride  40 mEq Oral Q6H  . sucralfate  1 g Oral TID WC & HS  . DISCONTD: fluconazole (DIFLUCAN) IV  100 mg Intravenous Q24H  . DISCONTD: piperacillin-tazobactam (ZOSYN)  IV  3.375 g Intravenous Q8H   Continuous Infusions:    . sodium chloride 10 mL/hr at 03/02/12 2245  . sodium chloride 125 mL/hr at 03/06/12 0651   PRN Meds:.acetaminophen, acetaminophen, iohexol, ondansetron (ZOFRAN) IV, ondansetron, oxyCODONE Assessment/Plan:  UTI (lower urinary tract infection) - Have switched her back to PO diflucan and will continue for total of 14 days of treatment. Today is day 9 of treatment so will need additional 5 days of treatment. Repeat urine culture is negative.   Bladder rupture - Found on CT abdomen pelvis looking for source of hardness in the abdomen. Have consulted urology and per their recommendations ordered a CT cystogram with retrograde flow of contrast from catheter into the bladder. Foley still in place and draining.   Fever - Pt has been afebrile for >24 hours and will continue current treatment. D/Ced zosyn yesterday and seems likely etiology  was bacteremia but will be very careful with feeding only when awake and alert to prevent aspiration in the future. Await further recommendations from urology.    Bacteremia - Speciation was coagulase negative staff so are treating with 7 days of vancomycin per ID recommendations. As her level of vancomycin was supratherapeutic will hold dosing and consider treatment completed as it will be therapeutic for several more days. Will note clearly in D/C summary that if recurrent coag neg bacteremia may consider TEE to evaluate for endocarditis. Repeat blood cultures from 3/12 Day 2 are no growth so far. Will continue to follow.   PRESSURE ULCER HIP -  Wound care consult to care for her decubitus ulcers on her backside. Heels are in protective holsters to prevent ulcer.    Normocytic anemia  - Likely due to chronic disease and appears to be stable around 9 although we have been watching closely as she was having incident of vomiting blood. Did receive one unit of blood yesterday and blood counts are stable. Possibly due to the hematemesis and now resolved.    Leukocytosis - Resolved and treated appropriately. See above.   AKI - Resolving, Cr today is 0.86 from high of 1.37 yesterday. Likely overload of vancomycin and have now stopped.   Metabolic encephalopathy - Is now back at baseline and stable.  Disposition - Will await further urology recommendations for the bladder rupture.     LOS: 11 days   [name] [name] 03/06/2012, 7:53 AM

## 2012-03-06 NOTE — Progress Notes (Signed)
Speech Language/Pathology Speech Pathology: Dysphagia Treatment Note  Patient was observed with : Pureed/mechanical soft and Thin liquids.  Patient was noted to have s/s of aspiration : No:    Lung Sounds:  Diminished, clear Temperature: afebrile  Daughter present for session, reports pt has consumed very little PO for past two days despite diet. Pt alert. SLP provided trials of thin liquids and puree without difficulty. Pt with slow inefficient mastication of cracker, required cues to form bolus and swallow, appeared fatigued  Clinical Impression: Pt's alertness continues to improve though endurance is poor. Pt is tolerating thin liquids and complains taht food is not appetizing. Will upgrade pt to fine chop diet to conserve energy but also broaden choices.   Recommendations:  Dys 2 diet, thin liquids.   Pain:   none Intervention Required:   No   Goals: Goals Partially Met Harlon Ditty, MA CCC-SLP [phone]

## 2012-03-07 LAB — BASIC METABOLIC PANEL
BUN: 8 mg/dL (ref 6–23)
CO2: 23 mEq/L (ref 19–32)
Calcium: 9.5 mg/dL (ref 8.4–10.5)
Creatinine, Ser: 0.46 mg/dL — ABNORMAL LOW (ref 0.50–1.10)
Glucose, Bld: 90 mg/dL (ref 70–99)

## 2012-03-07 LAB — CBC
HCT: 32.4 % — ABNORMAL LOW (ref 36.0–46.0)
Hemoglobin: 10.3 g/dL — ABNORMAL LOW (ref 12.0–15.0)
MCH: 24.6 pg — ABNORMAL LOW (ref 26.0–34.0)
MCV: 77.5 fL — ABNORMAL LOW (ref 78.0–100.0)
Platelets: 338 10*3/uL (ref 150–400)
RBC: 4.18 MIL/uL (ref 3.87–5.11)

## 2012-03-07 MED ORDER — COLLAGENASE 250 UNIT/GM EX OINT: TOPICAL_OINTMENT | Freq: Every day | CUTANEOUS | Status: AC

## 2012-03-07 MED ORDER — FLUCONAZOLE 100 MG PO TABS: 100.0000 mg | ORAL_TABLET | Freq: Every day | ORAL | Status: DC

## 2012-03-07 NOTE — Progress Notes (Signed)
Subjective: Patient reports feelong " a little better"  Objective: Vital signs in last 24 hours: Temp:  [97.6 F (36.4 C)-98.7 F (37.1 C)] 98.7 F (37.1 C) (03/15 0522) Pulse Rate:  [92-97] 93  (03/15 0522) Resp:  [18-20] 18  (03/15 0522) BP: (105-130)/(60-79) 130/79 mmHg (03/15 0522) SpO2:  [95 %-98 %] 98 % (03/15 0522)A  Intake/Output from previous day: 03/14 0701 - 03/15 0700 In: 2812.5 [I.V.:2812.5] Out: 4700 [Urine:4700] Intake/Output this shift: Total I/O In: -  Out: 700 [Urine:700]  Past Medical History  Diagnosis Date  . Multiple sclerosis   . GERD (gastroesophageal reflux disease)   . Depression   . Recurrent UTI   . Recurrent pneumonia   . Chronic constipation   . Diverticulosis     Physical Exam:  General:awake and alert.  Lungs - Normal respiratory effort, chest expands symmetrically.  Abdomen - Soft, non-tender & non-distended.  Lab Results:  Basename 03/07/12 0750 03/06/12 1201 03/05/12 0500  WBC 8.3 9.6 5.8  HGB 10.3* 9.5* 8.6*  HCT 32.4* 29.5* 26.9*   BMET  Basename 03/07/12 0750 03/06/12 0630  NA 136 135  K 4.0 5.0  CL 105 106  CO2 23 22  GLUCOSE 90 82  BUN 8 12  CREATININE 0.46* 0.52  CALCIUM 9.5 9.6   No results found for this basename: LABURIN:1 in the last 72 hours Results for orders placed during the hospital encounter of 02/24/12  CULTURE, BLOOD (ROUTINE X 2)     Status: Normal   Collection Time   02/24/12 12:40 PM      Component Value Range Status Comment   Specimen Description BLOOD ARM RIGHT   Final    Special Requests BOTTLES DRAWN AEROBIC ONLY [facility]   Final    Culture  Setup Time [id number]   Final    Culture NO GROWTH 5 DAYS   Final    Report Status 03/01/2012 FINAL   Final   CULTURE, BLOOD (ROUTINE X 2)     Status: Normal   Collection Time   02/24/12 12:43 PM      Component Value Range Status Comment   Specimen Description BLOOD HAND LEFT   Final    Special Requests BOTTLES DRAWN AEROBIC AND ANAEROBIC 10CC   Final     Culture  Setup Time [id number]   Final    Culture NO GROWTH 5 DAYS   Final    Report Status 03/01/2012 FINAL   Final   URINE CULTURE     Status: Normal   Collection Time   02/24/12  1:14 PM      Component Value Range Status Comment   Specimen Description URINE, CATHETERIZED   Final    Special Requests NONE   Final    Culture  Setup Time [id number]   Final    Colony Count NO GROWTH   Final    Culture NO GROWTH   Final    Report Status 02/25/2012 FINAL   Final   MRSA PCR SCREENING     Status: Normal   Collection Time   02/24/12  7:14 PM      Component Value Range Status Comment   MRSA by PCR NEGATIVE  NEGATIVE  Final   URINE CULTURE     Status: Normal   Collection Time   02/24/12  8:06 PM      Component Value Range Status Comment   Specimen Description URINE, CATHETERIZED   Final    Special Requests NONE   Final  Culture  Setup Time [id number]   Final    Colony Count >=100,000 COLONIES/ML   Final    Culture YEAST   Final    Report Status 02/26/2012 FINAL   Final   CULTURE, BLOOD (ROUTINE X 2)     Status: Normal   Collection Time   02/24/12  8:10 PM      Component Value Range Status Comment   Specimen Description BLOOD RIGHT HAND   Final    Special Requests BOTTLES DRAWN AEROBIC ONLY 10CC   Final    Culture  Setup Time [id number]   Final    Culture NO GROWTH 5 DAYS   Final    Report Status 03/02/2012 FINAL   Final   CULTURE, BLOOD (ROUTINE X 2)     Status: Normal   Collection Time   02/24/12  8:13 PM      Component Value Range Status Comment   Specimen Description BLOOD LEFT HAND   Final    Special Requests BOTTLES DRAWN AEROBIC ONLY 10CC   Final    Culture  Setup Time [id number]   Final    Culture NO GROWTH 5 DAYS   Final    Report Status 03/02/2012 FINAL   Final   CULTURE, BLOOD (ROUTINE X 2)     Status: Normal   Collection Time   02/27/12  7:33 PM      Component Value Range Status Comment   Specimen Description BLOOD RIGHT HAND   Final    Special Requests BOTTLES  DRAWN AEROBIC AND ANAEROBIC 10CC   Final    Culture  Setup Time [id number]   Final    Culture NO GROWTH 5 DAYS   Final    Report Status 03/05/2012 FINAL   Final   CULTURE, BLOOD (ROUTINE X 2)     Status: Normal   Collection Time   02/27/12  7:47 PM      Component Value Range Status Comment   Specimen Description BLOOD LEFT HAND   Final    Special Requests BOTTLES DRAWN AEROBIC AND ANAEROBIC 10CC   Final    Culture  Setup Time [id number]   Final    Culture NO GROWTH 5 DAYS   Final    Report Status 03/05/2012 FINAL   Final   CULTURE, BLOOD (ROUTINE X 2)     Status: Normal (Preliminary result)   Collection Time   03/01/12 12:29 AM      Component Value Range Status Comment   Specimen Description BLOOD LEFT HAND   Final    Special Requests BOTTLES DRAWN AEROBIC ONLY 5CC   Final    Culture  Setup Time [id number]   Final    Culture     Final    Value:        BLOOD CULTURE RECEIVED NO GROWTH TO DATE CULTURE WILL BE HELD FOR 5 DAYS BEFORE ISSUING A FINAL NEGATIVE REPORT   Report Status PENDING   Incomplete   CULTURE, BLOOD (ROUTINE X 2)     Status: Normal   Collection Time   03/02/12 12:29 AM      Component Value Range Status Comment   Specimen Description BLOOD RIGHT ARM   Final    Special Requests     Final    Value: BOTTLES DRAWN AEROBIC AND ANAEROBIC 10CC BLUE,5CC RED   Culture  Setup Time [id number]   Final    Culture     Final    Value: STAPHYLOCOCCUS SPECIES (COAGULASE NEGATIVE)  Note: THE SIGNIFICANCE OF ISOLATING THIS ORGANISM FROM A SINGLE VENIPUNCTURE CANNOT BE PREDICTED WITHOUT FURTHER CLINICAL AND CULTURE CORRELATION. SUSCEPTIBILITIES AVAILABLE ONLY ON REQUEST.     Note: Gram Stain Report Called to,Read Back By and Verified With: WILLIE COLLINS 03/03/12 @1326  BY KRAWS   Report Status 03/04/2012 FINAL   Final   URINE CULTURE     Status: Normal   Collection Time   03/02/12  3:51 AM      Component Value Range Status Comment   Specimen Description URINE, CATHETERIZED    Final    Special Requests NONE   Final    Culture  Setup Time [id number]   Final    Colony Count NO GROWTH   Final    Culture NO GROWTH   Final    Report Status 03/03/2012 FINAL   Final   CULTURE, BLOOD (ROUTINE X 2)     Status: Normal (Preliminary result)   Collection Time   03/04/12 12:45 PM      Component Value Range Status Comment   Specimen Description BLOOD LEFT ARM   Final    Special Requests BOTTLES DRAWN AEROBIC AND ANAEROBIC 10CC   Final    Culture  Setup Time [id number]   Final    Culture     Final    Value:        BLOOD CULTURE RECEIVED NO GROWTH TO DATE CULTURE WILL BE HELD FOR 5 DAYS BEFORE ISSUING A FINAL NEGATIVE REPORT   Report Status PENDING   Incomplete   CULTURE, BLOOD (ROUTINE X 2)     Status: Normal (Preliminary result)   Collection Time   03/04/12  1:02 PM      Component Value Range Status Comment   Specimen Description BLOOD LEFT ARM   Final    Special Requests BOTTLES DRAWN AEROBIC AND ANAEROBIC 10CC   Final    Culture  Setup Time [id number]   Final    Culture     Final    Value:        BLOOD CULTURE RECEIVED NO GROWTH TO DATE CULTURE WILL BE HELD FOR 5 DAYS BEFORE ISSUING A FINAL NEGATIVE REPORT   Report Status PENDING   Incomplete     Studies/Results:  *RADIOLOGY REPORT*  Clinical Data: Abdominal pain  CT ABDOMEN AND PELVIS WITH CONTRAST  Technique: Multidetector CT imaging of the abdomen and pelvis was  performed following the standard protocol during bolus  administration of intravenous contrast.  Contrast: OMNIPAQUE IOHEXOL 300 MG/ML IJ SOLN, 1 OMNIPAQUE  IOHEXOL 300 MG/ML IJ SOLN  Comparison: None.  Findings: Tiny bilateral pleural effusions with compressive  atelectasis posteriorly in the visualized lung bases. Unremarkable  liver, spleen, adrenal glands. Mild pancreatic parenchymal atrophy  without focal lesion. Probable cyst at the lower pole of the right  kidney. No hydronephrosis. Stomach and small bowel nondilated.  Normal  appendix. The colon is nondistended.  There is a Foley catheter in the urinary bladder which is  incompletely distended with circumferential somewhat irregular  bladder wall thickening. There is a defect in the left anterior  wall of the bladder with extraperitoneal bladder rupture, and gas  and fluid in scattered pockets through the space of Retzius,  confirmed on delayed scans with contrast extending from the lumen  of the bladder through the bladder wall defect into the  collections.  There is a 3.2 x 7.6 cm left pelvic fluid collection which appears  loculated with a thick wall. There is heavily calcified  degenerated uterine fibroid. There is a small amount of free fluid  in the pelvis. There is no free air. Fixation hardware is noted  in the proximal left femur. Advanced facet degenerative changes in  the lower lumbar spine. No pelvic, retroperitoneal, or mesenteric  adenopathy. Portal vein patent.  IMPRESSION:  1. Extraperitoneal bladder rupture with associated defect noted in  the otherwise thick-walled urinary bladder. Recommend urologic  consultation.  2. 3.2 x 7.6 cm left pelvic complex fluid collection.  Original Report Authenticated By: Osa Craver, M.D.            External Result Report     External Result Report            Imaging      Assessment/Plan:  Extraperitoneal ? Urinoma in DNR MS patient. I do not see the need for cysto at this time. Would not consider bladder repair in episode of contained rupture. Would leave large catheter in place ( 18 or 20 F) and then follow temps as op. If she develops abscess, would drain percutaneously in Radiology. Have discussed with patient.  [name] [name] I 03/07/2012, 11:49 AM

## 2012-03-07 NOTE — Progress Notes (Signed)
Physical Therapy Treatment Patient Details Name: [name] [name] MRN: [id number] DOB: 10/03/1941 Today's Date: 03/07/2012  PT Assessment/Plan  PT - Assessment/Plan Comments on Treatment Session: Pt admitted with UTI and recent left femur fracture.  Pt able to participate in PT today due to increased cognition.  However, pt remains limited by severe weakness. PT Plan: Discharge plan remains appropriate;Frequency remains appropriate PT Frequency: Min 2X/week Follow Up Recommendations: Skilled nursing facility Equipment Recommended: Defer to next venue PT Goals  Acute Rehab PT Goals PT Goal Formulation: With patient Time For Goal Achievement: 2 weeks PT Goal: Supine/Side to Sit - Progress: Progressing toward goal PT Goal: Sit at Edge Of Bed - Progress: Progressing toward goal PT Goal: Sit to Supine/Side - Progress: Progressing toward goal  PT Treatment Precautions/Restrictions  Precautions Precautions: Fall Required Braces or Orthoses: No Restrictions Weight Bearing Restrictions: No Other Position/Activity Restrictions: per Dr Darnelle Catalan progress note on 02/11/12-per Dr Madelon Lips, pt can bear weight on L LE (but weight not specified) Pain No c/o with treatment. Mobility (including Balance) Bed Mobility Bed Mobility: Yes Rolling Right: Not tested (comment) Rolling Left: Not tested (comment) Right Sidelying to Sit: Not tested (comment) Left Sidelying to Sit: Not tested (comment) Supine to Sit: 1: +2 Total assist;Patient percentage (comment);HOB flat ((pt=10%)) Supine to Sit Details (indicate cue type and reason): Assist to shift weight anterior over BOS as well as faciliate motion of bilateral LEs to EOB.  Cues for sequence. Sit to Supine: 1: +2 Total assist;Patient percentage (comment);HOB flat ((pt=5%)) Sit to Supine - Details (indicate cue type and reason): Assist to slow descent of trunk to bed with cues for sequence as well as facilitate motion of bilateral LEs onto  bed. Transfers Transfers: No Stand Pivot Transfers: Not tested (comment) Ambulation/Gait Ambulation/Gait: No Stairs: No Wheelchair Mobility Wheelchair Mobility: No  Posture/Postural Control Posture/Postural Control: Postural limitations Postural Limitations: Left and posterior lean of trunk with decreased knowledge of midline. Balance Balance Assessed: Yes Static Sitting Balance Static Sitting - Balance Support: Bilateral upper extremity supported;Feet supported Static Sitting - Level of Assistance: 1: +1 Total assist (Up to total assist.) Static Sitting - Comment/# of Minutes: Pt sat EOB for 5-8 minutes with up to total assist.  Pt with significant posterior and left lean of trunk with cues to correct.  Pt able to attempt correction of lean, but unable to attain without total assistance. End of Session PT - End of Session Activity Tolerance: Patient tolerated treatment well Patient left: in bed;with call bell in reach;with bed alarm set Nurse Communication: Need for lift equipment General Behavior During Session: [facility] for tasks performed Cognition: [facility] for tasks performed  Cephus Shelling 03/07/2012, 12:13 PM  03/07/2012 Cephus Shelling, PT, DPT [phone]

## 2012-03-07 NOTE — Progress Notes (Signed)
Subjective: She is awake when I enter and states that her appetite is still poor but she is not in pain. She is awake and asks questions. We talk about her condition and the recommendations from urology. No chest pain, no abdominal pain, no leg swelling. No fevers overnight, no nausea, no vomiting, no diarrhea.   Objective: Vital signs in last 24 hours: Filed Vitals:   03/06/12 0530 03/06/12 1529 03/06/12 2110 03/07/12 0522  BP: 127/75 118/79 105/60 130/79  Pulse: 89 92 97 93  Temp: 98 F (36.7 C) 98 F (36.7 C) 97.6 F (36.4 C) 98.7 F (37.1 C)  TempSrc: Oral     Resp: 20 18 20 18   Height:      Weight:      SpO2: 100% 98% 95% 98%   Weight change:   Intake/Output Summary (Last 24 hours) at 03/07/12 1408 Last data filed at 03/07/12 0900  Gross per 24 hour  Intake 2812.5 ml  Output   4000 ml  Net -1187.5 ml   Physical Exam: General: more awake today and chats, understands what I am saying HEENT: is able to open her eyes and follows me with them while we talk Cardiac: S 1 S 2 heard, no overt murmurs heard Pulm: good air movement heard bilaterally Abd: lower abdomen was slightly hard,non-tender to deep palpation, normal BS Ext: warm and well perfused, mild pedal edema, heels are in protective holster to prevent bed sores, no tenderness to palpation Ext: arms seem to be less swollen although she does not move her left hand at all and cannot squeeze my hand, weak squeeze with the right, pulses intact bilaterally and color good and warm  Backside: There are multiple bed sores on her buttocks Neuro: is oriented to person and place but not specific date, knows she is in the hospital   Lab Results: Basic Metabolic Panel:  Lab 03/07/12 1610 03/06/12 0630 03/02/12 0002  NA 136 135 --  K 4.0 5.0 --  CL 105 106 --  CO2 23 22 --  GLUCOSE 90 82 --  BUN 8 12 --  CREATININE 0.46* 0.52 --  CALCIUM 9.5 9.6 --  MG -- -- 1.9  PHOS -- -- --   CBC:  Lab 03/07/12 0750 03/06/12 1201    WBC 8.3 9.6  NEUTROABS -- --  HGB 10.3* 9.5*  HCT 32.4* 29.5*  MCV 77.5* 77.4*  PLT 338 363   Cardiac Enzymes:  Lab 03/01/12 2330  CKTOTAL 8  CKMB 0.9  CKMBINDEX --  TROPONINI <0.30   CBG:  Lab 03/01/12 2242  GLUCAP 125*   Micro Results: Recent Results (from the past 240 hour(s))  CULTURE, BLOOD (ROUTINE X 2)     Status: Normal   Collection Time   02/27/12  7:33 PM      Component Value Range Status Comment   Specimen Description BLOOD RIGHT HAND   Final    Special Requests BOTTLES DRAWN AEROBIC AND ANAEROBIC 10CC   Final    Culture  Setup Time [id number]   Final    Culture NO GROWTH 5 DAYS   Final    Report Status 03/05/2012 FINAL   Final   CULTURE, BLOOD (ROUTINE X 2)     Status: Normal   Collection Time   02/27/12  7:47 PM      Component Value Range Status Comment   Specimen Description BLOOD LEFT HAND   Final    Special Requests BOTTLES DRAWN AEROBIC AND ANAEROBIC 10CC  Final    Culture  Setup Time [id number]   Final    Culture NO GROWTH 5 DAYS   Final    Report Status 03/05/2012 FINAL   Final   CULTURE, BLOOD (ROUTINE X 2)     Status: Normal (Preliminary result)   Collection Time   03/01/12 12:29 AM      Component Value Range Status Comment   Specimen Description BLOOD LEFT HAND   Final    Special Requests BOTTLES DRAWN AEROBIC ONLY 5CC   Final    Culture  Setup Time [id number]   Final    Culture     Final    Value:        BLOOD CULTURE RECEIVED NO GROWTH TO DATE CULTURE WILL BE HELD FOR 5 DAYS BEFORE ISSUING A FINAL NEGATIVE REPORT   Report Status PENDING   Incomplete   CULTURE, BLOOD (ROUTINE X 2)     Status: Normal   Collection Time   03/02/12 12:29 AM      Component Value Range Status Comment   Specimen Description BLOOD RIGHT ARM   Final    Special Requests     Final    Value: BOTTLES DRAWN AEROBIC AND ANAEROBIC 10CC BLUE,5CC RED   Culture  Setup Time [id number]   Final    Culture     Final    Value: STAPHYLOCOCCUS SPECIES (COAGULASE NEGATIVE)      Note: THE SIGNIFICANCE OF ISOLATING THIS ORGANISM FROM A SINGLE VENIPUNCTURE CANNOT BE PREDICTED WITHOUT FURTHER CLINICAL AND CULTURE CORRELATION. SUSCEPTIBILITIES AVAILABLE ONLY ON REQUEST.     Note: Gram Stain Report Called to,Read Back By and Verified With: WILLIE COLLINS 03/03/12 @1326  BY KRAWS   Report Status 03/04/2012 FINAL   Final   URINE CULTURE     Status: Normal   Collection Time   03/02/12  3:51 AM      Component Value Range Status Comment   Specimen Description URINE, CATHETERIZED   Final    Special Requests NONE   Final    Culture  Setup Time [id number]   Final    Colony Count NO GROWTH   Final    Culture NO GROWTH   Final    Report Status 03/03/2012 FINAL   Final   CULTURE, BLOOD (ROUTINE X 2)     Status: Normal (Preliminary result)   Collection Time   03/04/12 12:45 PM      Component Value Range Status Comment   Specimen Description BLOOD LEFT ARM   Final    Special Requests BOTTLES DRAWN AEROBIC AND ANAEROBIC 10CC   Final    Culture  Setup Time [id number]   Final    Culture     Final    Value:        BLOOD CULTURE RECEIVED NO GROWTH TO DATE CULTURE WILL BE HELD FOR 5 DAYS BEFORE ISSUING A FINAL NEGATIVE REPORT   Report Status PENDING   Incomplete   CULTURE, BLOOD (ROUTINE X 2)     Status: Normal (Preliminary result)   Collection Time   03/04/12  1:02 PM      Component Value Range Status Comment   Specimen Description BLOOD LEFT ARM   Final    Special Requests BOTTLES DRAWN AEROBIC AND ANAEROBIC 10CC   Final    Culture  Setup Time [id number]   Final    Culture     Final    Value:        BLOOD CULTURE RECEIVED NO GROWTH  TO DATE CULTURE WILL BE HELD FOR 5 DAYS BEFORE ISSUING A FINAL NEGATIVE REPORT   Report Status PENDING   Incomplete    Studies/Results: Ct Pelvis W Contrast  03/06/2012  *RADIOLOGY REPORT*  Clinical Data:  Bladder rupture.  CT PELVIS WITH CONTRAST  Technique:  Multidetector CT imaging of the pelvis was performed using the standard protocol  following the bolus administration of intravenous contrast.  Contrast: OMNIPAQUE IOHEXOL 300 MG/ML IJ SOLN IV and 450 ml of sodium chloride and 50 ml of Omnipaque-300 instilled in the bladder through the Foley catheter.  Comparison:  CT scan dated 03/06/2011  Findings:  The CT cystogram demonstrates the patient has an 11 x 13 mm hole in the anterior-superior aspect of the bladder slightly to the left of midline.  The bladder wall is thickened.  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  Contrast extravasates into a contained area anterior to the bladder extending to the right and left of midline. This area measures approximately 20 x 4.5 x 5.5 cm.  There are small areas of air and extravasated contrast extending anteriorly just under the rectus muscles.  There is an oblong fluid collection in the pelvis posterior to the bladder which does not fill with contrast and probably represents a pelvic abscess.  It measures 9 x 4 x 6 cm. This abscess appears to extend to involve the left ovary. The uterus is atrophic with a 2.3 cm calcified fibroid within it.  There is slight thickening of the wall of the transverse colon as it extends along the superior aspect of the contained bladder rupture.  IMPRESSION:  1.  Perforation of the superior anterior left lateral aspect of the bladder with a relatively contained  area of contrast extravasation anterior to the bladder and extending to the right  and left of midline. 2.  Probable adjacent pelvic abscess just posterior and superior to the bladder asymmetric to the left. 3.  No discrete tumor.  Original Report Authenticated By: Gwynn Burly, M.D.   Ct Abdomen Pelvis W Contrast  03/05/2012  *RADIOLOGY REPORT*  Clinical Data: Abdominal pain  CT ABDOMEN AND PELVIS WITH CONTRAST  Technique:  Multidetector CT imaging of the abdomen and pelvis was performed following the standard protocol during bolus administration of intravenous  contrast.  Contrast: OMNIPAQUE IOHEXOL 300 MG/ML IJ SOLN, 1 OMNIPAQUE IOHEXOL 300 MG/ML IJ SOLN  Comparison: None.  Findings: Tiny bilateral pleural effusions with compressive atelectasis posteriorly in the visualized lung bases.  Unremarkable liver, spleen, adrenal glands.  Mild pancreatic parenchymal atrophy without focal lesion.  Probable cyst at the lower pole of the right kidney.  No hydronephrosis.  Stomach and small bowel nondilated. Normal appendix.  The colon is nondistended.  There is a Foley catheter in the urinary bladder which is incompletely distended with circumferential somewhat irregular bladder wall thickening.  There is a defect in the left anterior wall of the bladder with extraperitoneal bladder rupture, and gas and fluid in   scattered pockets through the space of Retzius, confirmed on delayed scans with contrast extending from the lumen of the bladder through the bladder wall defect into the collections.  There is a 3.2 x 7.6 cm left pelvic fluid collection which appears loculated with a thick wall.  There is heavily calcified degenerated uterine fibroid.  There is a small amount of free fluid in the pelvis.  There is no free air.  Fixation hardware is noted in the  proximal left femur.  Advanced facet degenerative changes in the lower lumbar spine.  No pelvic, retroperitoneal, or mesenteric adenopathy.  Portal vein patent.  IMPRESSION:  1.  Extraperitoneal bladder rupture with associated defect noted in the otherwise thick-walled urinary bladder.  Recommend urologic consultation. 2.  3.2 x 7.6 cm left pelvic complex fluid collection.  Original Report Authenticated By: Osa Craver, M.D.   Medications: I have reviewed the patient's current medications. Scheduled Meds:    . amitriptyline  100 mg Oral QHS  . collagenase   Topical Daily  . docusate sodium  100 mg Oral BID  . feeding supplement  237 mL Oral TID BM  . fluconazole  100 mg Oral Daily  . FLUoxetine  40 mg Oral  Daily  . mulitivitamin with minerals  1 tablet Oral Daily  . oxybutynin  5 mg Oral BID  . polyethylene glycol  17 g Oral Daily  . sucralfate  1 g Oral TID WC & HS   Continuous Infusions:    . sodium chloride 10 mL/hr at 03/02/12 2245  . sodium chloride 125 mL/hr at 03/07/12 0916   PRN Meds:.acetaminophen, acetaminophen, iohexol, ondansetron (ZOFRAN) IV, ondansetron, oxyCODONE  Assessment/Plan:  UTI (lower urinary tract infection) - Have switched her back to PO diflucan and will continue for total of 14 days of treatment. Today is day 10 of treatment so will need additional 4 days of treatment. Repeat urine culture is negative.   Bladder rupture - Found on CT abdomen pelvis looking for source of hardness in the abdomen. Urology did see her yesterday and get CT cystogram. Did replace foley with larger size to accommodate for some particulate drainage and now blocking flow. If flow obstructs again should be replaced otherwise no further treatment and it should resolve on its own. She will follow up with her urologist as an out-patient. Foley still in place and draining.   Fever - Pt has been afebrile for >24 hours and will continue current treatment. Seems likely etiology was bacteremia but will be very careful with feeding only when awake and alert to prevent aspiration in the future.   Bacteremia - Speciation was coagulase negative staff so are treating with 7 days of vancomycin per ID recommendations. As her level of vancomycin was supratherapeutic will hold dosing and consider treatment completed as it will be therapeutic for several more days. Will note clearly in D/C summary that if recurrent coag neg bacteremia may consider TEE to evaluate for endocarditis. Repeat blood cultures from 3/12 Day 3 are no growth so far. Will continue to follow.   PRESSURE ULCER HIP -  Wound care consult to care for her decubitus ulcers on her backside. Heels are in protective holsters to prevent ulcer.     Normocytic anemia - Likely due to chronic disease and appears to be stable around 9 although we have been watching closely as she was having incident of vomiting blood. Did receive one unit of blood during stay and blood counts have been stable since then. Possibly due to the hematemesis and now resolved.    Leukocytosis - Resolved and treated appropriately. See above.   AKI - Resolving, Cr today is 0.5 from high of 1.37 yesterday. Likely overload of vancomycin and have now stopped.   Metabolic encephalopathy - Is now back at baseline and stable.  Disposition - Will await place at her SNF home. Otherwise stable and will follow up with urology as an out-patient to follow her bladder rupture.  LOS: 12 days   Genella Mech 03/07/2012, 2:08 PM

## 2012-03-07 NOTE — Progress Notes (Signed)
Clinical Social Work-CSW contacted SNF who relayed they would be unable to accept pt before Saturday-CSW left weekend report and pt should be able to d/c back to [facility] over [employer], [phone]

## 2012-03-07 NOTE — Progress Notes (Signed)
IM Attending  Visited and reviewed with residents.  She is afebrile with nl VS.  No new complaints.  Appreciate urology consult from Dr. Patsi Sears.  She may be able to return to [facility] soon.

## 2012-03-07 NOTE — Discharge Summary (Signed)
Internal Medicine Teaching [facility] Discharge Note  Name: [name] [name] MRN: [id number] DOB: [birth year] 71 y.o.  Date of Admission: 02/24/2012 12:05 PM Date of Discharge: 03/08/2012 Attending Physician: Ulyess Mort, MD  Discharge Diagnosis: Principal Problem:  *UTI (lower urinary tract infection) Active Problems:  PRESSURE ULCER HIP  Multiple sclerosis  Normocytic anemia  Leukocytosis  Hypercalcemia  Metabolic encephalopathy Bladder Rupture Bacteremia  Discharge Medications: Medication List  As of 03/07/2012  2:07 PM   STOP taking these medications         moxifloxacin 400 MG tablet         TAKE these medications         amitriptyline 100 MG tablet   Commonly known as: ELAVIL   Take 100 mg by mouth at bedtime.      bisacodyl 5 MG EC tablet   Commonly known as: DULCOLAX   Take 2 tablets (10 mg total) by mouth daily as needed.      collagenase ointment   Commonly known as: SANTYL   Apply topically daily.      docusate sodium 100 MG capsule   Commonly known as: COLACE   Take 200 mg by mouth 2 (two) times daily.      feeding supplement Liqd   Take 30 mLs by mouth 2 (two) times daily.      feeding supplement Pudg   Take 1 Container by mouth 2 (two) times daily between meals.      ferrous sulfate 325 (65 FE) MG tablet   Take 1 tablet (325 mg total) by mouth 2 (two) times daily with a meal.      fluconazole 100 MG tablet   Commonly known as: DIFLUCAN   Take 1 tablet (100 mg total) by mouth daily.      FLUoxetine 40 MG capsule   Commonly known as: PROZAC   Take 40 mg by mouth daily.      gabapentin 300 MG capsule   Commonly known as: NEURONTIN   Take 2 capsules (600 mg total) by mouth 3 (three) times daily.      multivitamins ther. w/minerals Tabs   Take 1 tablet by mouth daily.      naproxen sodium 275 MG tablet   Commonly known as: ANAPROX   Take 275 mg by mouth 2 (two) times daily with a meal.      omeprazole 20 MG capsule   Commonly known  as: PRILOSEC   Take 20 mg by mouth 2 (two) times daily.      oxyCODONE 5 MG immediate release tablet   Commonly known as: Oxy IR/ROXICODONE   Take 1 tablet (5 mg total) by mouth every 4 (four) hours as needed for pain. pain      vitamin C 500 MG tablet   Commonly known as: ASCORBIC ACID   Take 500 mg by mouth 2 (two) times daily.            Disposition and follow-up:   Ms.Etosha [name] was discharged from [facility] in Stable condition.    Follow-up Appointments: Follow-up Information    Follow up with Thane Edu, MD on 03/07/2012.      Follow up with [facility]  on 03/26/2012. (With the nurse practicioner for follow up at  2:45)    Contact information:   [address] [place] [address] [phone]          Consultations: Treatment Team:  Kathi Ludwig, MD  Procedures  Performed:  Dg Chest 1 View  02/08/2012  *RADIOLOGY REPORT*  Clinical Data: Recheck for pneumonia.  No chest pain or cough.  CHEST - 1 VIEW  Comparison: 02/07/2012  Findings: The patient has right-sided PICC line, tip to the level of the superior vena cava.  There continues to be patchy density within the right lung and medial left lung base with little change from prior study.  Prior left humerus ORIF.  IMPRESSION: Little interval change.  Original Report Authenticated By: Patterson Hammersmith, M.D.   Dg Chest 2 View  02/17/2012  *RADIOLOGY REPORT*  Clinical Data: Short of breath  CHEST - 2 VIEW  Comparison: 02/12/2012  Findings: Lungs are under aerated with bibasilar atelectasis. Normal heart size.  No pneumothorax.  No pleural effusion.  IMPRESSION: Bibasilar atelectasis.  Original Report Authenticated By: Donavan Burnet, M.D.   Dg Abd 1 View  03/02/2012  *RADIOLOGY REPORT*  Clinical Data: Abdominal pain, nausea and vomiting.  ABDOMEN - 1 VIEW  Comparison: 02/18/2012  Findings: There is moderate stool interspersed with barium in the  proximal colon.  The patient did ingest barium for a swallowing function study on 02/26/2012.  The rest of the colon shows no significant stool burden.  There is no evidence of bowel obstruction or ileus.  Stable calcification in the left hemipelvis likely relates to degenerative calcification in the uterus or adnexal region.  IMPRESSION: No overt bowel obstruction.  There is a moderate amount of fecal material in the ascending colon which is mixed with previously ingested barium.  Original Report Authenticated By: Reola Calkins, M.D.   Dg Abd 1 View  02/18/2012  *RADIOLOGY REPORT*  Clinical Data: Constipation  ABDOMEN - 1 VIEW  Comparison: 02/13/2012  Findings:  Moderate colonic stool burden without definite evidence of obstruction. Evaluation pneumoperitoneum is limited secondary to supine patient positioning and exclusion of the lower thorax.  No definite pneumatosis.  Calcification overlying the left hemipelvis likely represents a calcified fibroid.  Left femoral intramedullary rod, incompletely evaluated. Possible bone island within the intertrochanteric right femur. Lumbar spine degenerative change.  IMPRESSION: Moderate colonic stool burden without evidence of obstruction.  Original Report Authenticated By: Waynard Reeds, M.D.   Dg Abd 1 View  02/13/2012  *RADIOLOGY REPORT*  Clinical Data: Abdominal tenderness.  ABDOMEN - 1 VIEW  Comparison: 01/12/2012  Findings: Band-like opacity noted in the left lower lobe with retro diaphragmatic opacities bilaterally.  I cannot exclude pneumonia.  Levoconvex rotary scoliosis the lumbar spine may be positional.  Degenerative arthropathy of the hips noted.  Calcified structure in the left pelvis may represent a calcified fibroid or vascular calcification.  Calcified ovarian lesion is considered less likely.  Scattered gas is present in the colon.  No dilated small bowel is observed.  The upper margin of a retrograde intramedullary rod of the left femur is  noted.  IMPRESSION:  1.  Bilateral lower lobe airspace opacities, nonspecific. 2.  Lumbar spondylosis and scoliosis. 3.  Degenerative arthropathy of the hips bilaterally. 4.  Calcified structure in the left pelvis is probably a calcified uterine fibroid.  Vascular calcification or calcified ovarian lesion are considered statistically less likely.  Original Report Authenticated By: Dellia Cloud, M.D.   Ct Head Wo Contrast  02/24/2012  *RADIOLOGY REPORT*  Clinical Data: Fever.  Not responsive.  Being treated for pneumonia.  CT HEAD WITHOUT CONTRAST  Technique:  Contiguous axial images were obtained from the base of the skull through the vertex without contrast.  Comparison: None.  Findings: There is central and cortical atrophy. There is no evidence for hemorrhage, mass lesion, or acute infarction. Bilateral basal ganglia calcifications are present.  Bone windows show hyperostosis frontalis.  No evidence for acute fracture.  Visualized paranasal sinuses and mastoid air cells are unremarkable.  IMPRESSION:  1.  Atrophy. 2. No evidence for acute intracranial abnormality.  Original Report Authenticated By: Patterson Hammersmith, M.D.   Ct Chest W Contrast  02/24/2012  *RADIOLOGY REPORT*  Clinical Data: Chronic UTI with sepsis.  Evaluate for pneumonia or empyema.  CT CHEST WITH CONTRAST  Technique:  Multidetector CT imaging of the chest was performed following the standard protocol during bolus administration of intravenous contrast.  Contrast: 80mL OMNIPAQUE IOHEXOL 300 MG/ML IJ SOLN  Comparison: Chest radiographs 02/17/2012 and 02/24/2012.  Chest CT 02/13/2012.  Findings: Minimal thyroid heterogeneity appears stable.  There are no enlarged mediastinal or hilar lymph nodes.  Mild coronary artery calcifications are noted.  There is no significant residual pleural or pericardial effusion. The dependent opacities at both lung bases have improved likely reflecting resolving atelectasis.  There is also improving  patchy airspace disease in the right upper lobe. No worsening air space disease or mass is identified.  The visualized upper abdomen has a stable appearance.  There is chronic slightly progressive elevation of the left hemidiaphragm. No acute osseous findings are seen.  Motion simulates a fracture of the mid sternum on the sagittal images.  IMPRESSION:  1.  Improving patchy bilateral air space opacities.  No worsening pneumonia identified. 2.  No significant residual pleural effusion.  No evidence of empyema. 3.  Progressive elevation of the left hemidiaphragm.  Original Report Authenticated By: Gerrianne Scale, M.D.   Ct Chest W Contrast  02/13/2012  *RADIOLOGY REPORT*  Clinical Data: Pleuritic chest pain.  Cough.  CT CHEST WITH CONTRAST  Technique:  Multidetector CT imaging of the chest was performed following the standard protocol during bolus administration of intravenous contrast.  Contrast: 80mL OMNIPAQUE IOHEXOL 300 MG/ML IV SOLN  Comparison: Portable chest obtained yesterday.  Findings: Small right pleural effusion.  Posterior atelectasis in both lower lobes, greater on the right.  There is also patchy opacity in the right perihilar region and more superiorly in the right upper lobe.  There is a small amount of patchy opacity in the right middle lobe, medially.  A small amount of patchy opacity is also noted in the right lower lobe.  No masses or enlarged lymph nodes are seen.  The upper abdomen is unremarkable.  Mild thoracic spine degenerative changes.  IMPRESSION:  1.  Multifocal right lung pneumonia, most pronounced in the perihilar region. 2.  Small right pleural effusion. 3.  Bilateral lower lobe atelectasis, greater on the right.  Original Report Authenticated By: Darrol Angel, M.D.   Ct Angio Chest W/cm &/or Wo Cm  03/02/2012  *RADIOLOGY REPORT*  Clinical Data: Back pain.  Elevated D-dimer.  CT ANGIOGRAPHY CHEST  Technique:  Multidetector CT imaging of the chest using the standard protocol  during bolus administration of intravenous contrast. Multiplanar reconstructed images including MIPs were obtained and reviewed to evaluate the vascular anatomy.  Contrast: 74mL OMNIPAQUE IOHEXOL 350 MG/ML IV SOLN  Comparison: Multiple priors , most recentlyCT of the thorax 02/24/2012  Findings:  Mediastinum: Although this study is slightly limited by respiratory motion, there is no evidence of central, lobar or segmental sized pulmonary embolism (secondary to respiratory motion, some areas of the subsegmental pulmonary arteries are obscured  by motion such that smaller subsegmental sized pulmonary embolism cannot be entirely excluded). Heart size is borderline enlarged. There is no significant pericardial fluid, thickening or pericardial calcification. There is atherosclerosis of the thoracic aorta, the great vessels of the mediastinum and the coronary arteries, including calcified atherosclerotic plaque in the left main and left circumflex coronary arteries. No pathologically enlarged mediastinal or hilar lymph nodes. The esophagus is diffusely patulous and fluid - filled with a thickened wall.  Lungs/Pleura: There continue to be some patchy areas of interstitial prominence and small focal regions of ground-glass attenuation air space disease scattered throughout the lungs bilaterally (most pronounced in a dependent distribution), similar compared to prior examination from 02/24/2012.  Dependent atelectasis and/or scarring is noted in the lung bases bilaterally (unchanged).  No definite pleural effusions.  No definite suspicious appearing pulmonary nodules or masses are identified (please note that accurate assessment for small pulmonary nodules is limited by patient respiratory motion).  Upper Abdomen: Unremarkable.  Musculoskeletal: There are no aggressive appearing lytic or blastic lesions noted in the visualized portions of the skeleton.  IMPRESSION: 1.  Although the study is slightly limited by respiratory  motion, there is no evidence to suggest clinically relevant central, lobar or segmental sized pulmonary embolism.  Smaller subsegmental sized pulmonary embolism cannot be entirely excluded. 2.  Persistence of patchy areas of interstitial prominence and ground-glass attenuation air space disease in the lungs bilaterally, predominately in a dependent pattern.  Given the patulous and fluid-filled esophagus, these findings may represent sequela of repeated aspiration.  Clinical correlation for signs and symptoms of aspiration are recommended. 3.  Given the thick wall of the esophagus, esophagitis may be present. 4. Atherosclerosis, including left main and left circumflex coronary artery disease. Please note that although the presence of coronary artery calcium documents the presence of coronary artery disease, the severity of this disease and any potential stenosis cannot be assessed on this non-gated CT examination.  Assessment for potential risk factor modification, dietary therapy or pharmacologic therapy may be warranted, if clinically indicated. 5.  Borderline cardiomegaly.  Original Report Authenticated By: Florencia Reasons, M.D.   Ct Pelvis W Contrast  03/06/2012  *RADIOLOGY REPORT*  Clinical Data:  Bladder rupture.  CT PELVIS WITH CONTRAST  Technique:  Multidetector CT imaging of the pelvis was performed using the standard protocol following the bolus administration of intravenous contrast.  Contrast: OMNIPAQUE IOHEXOL 300 MG/ML IJ SOLN IV and 450 ml of sodium chloride and 50 ml of Omnipaque-300 instilled in the bladder through the Foley catheter.  Comparison:  CT scan dated 03/06/2011  Findings:  The CT cystogram demonstrates the patient has an 11 x 13 mm hole in the anterior-superior aspect of the bladder slightly to the left of midline.  The bladder wall is thickened.  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  Contrast extravasates into a contained area  anterior to the bladder extending to the right and left of midline. This area measures approximately 20 x 4.5 x 5.5 cm.  There are small areas of air and extravasated contrast extending anteriorly just under the rectus muscles.  There is an oblong fluid collection in the pelvis posterior to the bladder which does not fill with contrast and probably represents a pelvic abscess.  It measures 9 x 4 x 6 cm. This abscess appears to extend to involve the left ovary. The uterus is atrophic with a 2.3 cm calcified fibroid within it.  There is  slight thickening of the wall of the transverse colon as it extends along the superior aspect of the contained bladder rupture.  IMPRESSION:  1.  Perforation of the superior anterior left lateral aspect of the bladder with a relatively contained  area of contrast extravasation anterior to the bladder and extending to the right  and left of midline. 2.  Probable adjacent pelvic abscess just posterior and superior to the bladder asymmetric to the left. 3.  No discrete tumor.  Original Report Authenticated By: Gwynn Burly, M.D.   Ct Abdomen Pelvis W Contrast  03/05/2012  *RADIOLOGY REPORT*  Clinical Data: Abdominal pain  CT ABDOMEN AND PELVIS WITH CONTRAST  Technique:  Multidetector CT imaging of the abdomen and pelvis was performed following the standard protocol during bolus administration of intravenous contrast.  Contrast: OMNIPAQUE IOHEXOL 300 MG/ML IJ SOLN, 1 OMNIPAQUE IOHEXOL 300 MG/ML IJ SOLN  Comparison: None.  Findings: Tiny bilateral pleural effusions with compressive atelectasis posteriorly in the visualized lung bases.  Unremarkable liver, spleen, adrenal glands.  Mild pancreatic parenchymal atrophy without focal lesion.  Probable cyst at the lower pole of the right kidney.  No hydronephrosis.  Stomach and small bowel nondilated. Normal appendix.  The colon is nondistended.  There is a Foley catheter in the urinary bladder which is incompletely distended with  circumferential somewhat irregular bladder wall thickening.  There is a defect in the left anterior wall of the bladder with extraperitoneal bladder rupture, and gas and fluid in   scattered pockets through the space of Retzius, confirmed on delayed scans with contrast extending from the lumen of the bladder through the bladder wall defect into the collections.  There is a 3.2 x 7.6 cm left pelvic fluid collection which appears loculated with a thick wall.  There is heavily calcified degenerated uterine fibroid.  There is a small amount of free fluid in the pelvis.  There is no free air.  Fixation hardware is noted in the proximal left femur.  Advanced facet degenerative changes in the lower lumbar spine.  No pelvic, retroperitoneal, or mesenteric adenopathy.  Portal vein patent.  IMPRESSION:  1.  Extraperitoneal bladder rupture with associated defect noted in the otherwise thick-walled urinary bladder.  Recommend urologic consultation. 2.  3.2 x 7.6 cm left pelvic complex fluid collection.  Original Report Authenticated By: Osa Craver, M.D.   US Renal  02/28/2012  *RADIOLOGY REPORT*  Clinical Data: History of recurrent urinary tract infection. Neurogenic bladder.  Elevated BUN.  RENAL/URINARY TRACT ULTRASOUND COMPLETE  Comparison:  None.  Findings:  Right Kidney:  Measures 10.8 cm and appears normal without stone, mass or hydronephrosis.  Left Kidney:  Measures measures 11.1 cm.  A 1.0 cm cyst is noted. No stone or hydronephrosis.  Bladder:  Debris is present within the urinary bladder.  A Foley catheter is in place.  Other findings:  Visualized liver parenchyma is markedly echogenic. The spleen measures 12.8 x 5.2 x 14.8 cm for a volume of 513.0 cm cubed.  IMPRESSION:  1.  Negative for hydronephrosis or stone. 2.  Debris is present within the urinary bladder. 3.  Fatty infiltration of liver. 4.  Splenomegaly.  Original Report Authenticated By: Bernadene Bell. Maricela Curet, M.D.   Dg Chest Port 1  View  03/02/2012  *RADIOLOGY REPORT*  Clinical Data: Shortness of breath, tachycardia  PORTABLE CHEST - 1 VIEW  Comparison: 02/24/2012 CT  Findings: Heart size upper normal limits.  Low lung volumes. Mild right suprahilar and left  retrocardiac opacities and central vascular congestion.  No definite pleural effusion.  No pneumothorax.  No acute osseous abnormality.  IMPRESSION: No significant interval change.  Mild retrocardiac and right suprahilar opacities.  Original Report Authenticated By: Waneta Martins, M.D.   Dg Chest Port 1 View  02/24/2012  *RADIOLOGY REPORT*  Clinical Data: Fever  PORTABLE CHEST - 1 VIEW  Comparison: 02/17/2012  Findings: Some worsening of left retrocardiac consolidation or atelectasis. Linear atelectasis or scarring in the right midlung stable.  Heart size normal.  No effusion.  IMPRESSION:  Worsening left retrocardiac atelectasis or pneumonia.  Original Report Authenticated By: Osa Craver, M.D.   Dg Chest Port 1 View  02/12/2012  *RADIOLOGY REPORT*  Clinical Data: Fever  PORTABLE CHEST - 1 VIEW  Comparison: 02/08/2012  Findings: Decreased lung volumes with patchy right hilar and bibasilar airspace disease.  This could represent atelectasis versus pneumonia.  No effusion or pneumothorax.  No definite edema. Stable heart size.  Right PICC line remains.  IMPRESSION: Right perihilar and basilar streaky atelectasis/airspace process. Decreased lung volumes.  Original Report Authenticated By: Judie Petit. Ruel Favors, M.D.   Dg Chest Port 1 View  02/07/2012  *RADIOLOGY REPORT*  Clinical Data: Shortness of breath  PORTABLE CHEST - 1 VIEW  Comparison: 02/06/2012  Findings: Shallow inspiration.  Cardiac enlargement with pulmonary vascular congestion and perihilar infiltrates.  Changes are similar to previous study.  No pneumothorax.  Right PICC catheter is unchanged in position.  IMPRESSION: Shallow inspiration with stable appearance of cardiac enlargement, pulmonary vascular  congestion, and perihilar infiltration.  Original Report Authenticated By: Marlon Pel, M.D.   Dg Chest Port 1 View  02/06/2012  *RADIOLOGY REPORT*  Clinical Data: Increasing shortness of breath.  Decreasing oxygen saturation.  PORTABLE CHEST - 1 VIEW  Comparison: 02/06/2012 at 0416 hours  Findings: Shallow inspiration.  Borderline heart size and pulmonary vascularity.  Bilateral perihilar air space interstitial disease, greater on the right.  Changes could represent pneumonia or edema. Suggestion of blunting of the left costophrenic angle.  No pneumothorax.  Findings appear stable since the previous study. Interval placement of a right PICC catheter with tip over the low SVC region.  IMPRESSION: Shallow inspiration with borderline heart size and pulmonary vascularity.  Perihilar air space interstitial disease could represent pneumonia or edema.  PICC line tip is over the low SVC region.  Original Report Authenticated By: Marlon Pel, M.D.   Dg Swallowing Func-no Report  02/26/2012  CLINICAL DATA: dysphagia   FLUOROSCOPY FOR SWALLOWING FUNCTION STUDY:  Fluoroscopy was provided for swallowing function study, which was  administered by a speech pathologist.  Final results and recommendations  from this study are contained within the speech pathology report.     Admission HPI:  This is a 71 year old female with past medical history of multiple sclerosis, with an indwelling catheter and multiple decubitus ulcer, recurrent UTIs and recurrent pneumonias recently discharged from the hospital on 02/18/2012 nursing home at this time she was treated with vancomycin and Zosyn IV for 7 days then switch to Avelox for 7 additional days that comes in for confusion. As per the daughter the patient started getting confused this morning to the point where she could not eat or communicate. She relates this started suddenly. The family has been informed by the nursing home that for the last 24 hours she has not  been eating or drinking well.  Here in the ED a chest x-ray was done that is concerning for  pneumonia on the left side. Which is the same side she was treated previously. An ABG was done that was venous with a PCO2 of 36 and PO2 of 43 blood pressure has been stable, patient is satting 94-93% on 2 L. She got vancomycin and Zosyn in the ED. Also her urine appears to be purulent as she has an indwelling catheter. So we were asked to admit and further evaluate   Hospital Course by problem list:  Altered Mental status - Please see course for UTI and Bacteremia however was primary diagnosis at admission and resolved over the course with one recurrence during hospital stay. She was given treatment for her primary medical nidus and resolved to her usual baseline of mental status. She was given Levaquin for several days which was switched to diflucan and then received 7 days of therapy of vancomycin and zosyn for concern of bacteremia.   Bladder Rupture - Incidentally found on a CT abdomen/pelvis to evaluate hardness in the abdomen and seen by urology while she was in the hospital. Treatment involves using wider foley catheter and adjusting as needed to ensure adequate drainage. Likely there are particles in the bladder and so the larger gauge foley will help ensure that it does not become blocked. Will need urological follow up and has appointment at [facility] urology. If concern for cancer in the future may consider cystoscopy however no intervention at this time is necessary.    *UTI (lower urinary tract infection) - The patient did come in with altered mental status and was originally treated with anti-biotics. Urine culture did grow yeast and she was then switched to diflucan. During periods of time when she was NPO we did use IV medications to treat her infection. She did mentally clear and had repeat urine culture that was showing no growth.   Bacteremia - The patient did have negative blood cultures on  admission and then once she had recurrence of metabolic encephalopathy did have a culture that was positive for coagulase negative staff which was treated for a total of 7 days of IV antibiotics per ID recommendations. If she does continue to have coagulase negative staff in her blood then it may be prudent to get a TEE to evaluate for endocarditis.    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 Right buttock superior wound is Stage III pressure ulcer - These were present on admission and wound care was consulted for adequate wound care while in the hospital. She did have an air mattress and leg holsters to help prevent further development of wounds on her legs.    Multiple sclerosis - She was on medication on out-patient however has failed several therapies in the past. No acute exacerbation during this hospital course and will resume her home treatment.    Normocytic anemia - She did have acute episode of hematemasis which was related to esophageal erosion which was treated with carafate. She did receive one unit of blood and stabilized her Hg level. No repeat incidents of bleeding during this hospital stay. Otherwise anemia of chronic disease is still present at discharge with baseline hemoglobin between 9-10.    Hypercalcemia - Did have Ca around 10.8 on admission and likely due to decreased oral intake and relative volume contraction which resolved with IVF. No recurrence during this stay to suggest malignant origin.    Metabolic encephalopathy - Please see altered mental status above. Resolved on discharge.     Discharge Vitals:  BP 130/79  Pulse 93  Temp(Src) 98.7 F (37.1 C) (Oral)  Resp 18  Ht 5\' 8"  (1.727 m)  Wt 169 lb 15.6 oz (77.1 kg)  BMI 25.84 kg/m2  SpO2 98%  Discharge Labs:  Results for orders placed during the hospital encounter of 02/24/12 (from the past 24 hour(s))  CBC     Status: Abnormal   Collection Time   03/07/12  7:50 AM      Component  Value Range   WBC 8.3  4.0 - 10.5 (K/uL)   RBC 4.18  3.87 - 5.11 (MIL/uL)   Hemoglobin 10.3 (*) 12.0 - 15.0 (g/dL)   HCT 16.1 (*) 09.6 - 46.0 (%)   MCV 77.5 (*) 78.0 - 100.0 (fL)   MCH 24.6 (*) 26.0 - 34.0 (pg)   MCHC 31.8  30.0 - 36.0 (g/dL)   RDW 04.5 (*) 40.9 - 15.5 (%)   Platelets 338  150 - 400 (K/uL)  BASIC METABOLIC PANEL     Status: Abnormal   Collection Time   03/07/12  7:50 AM      Component Value Range   Sodium 136  135 - 145 (mEq/L)   Potassium 4.0  3.5 - 5.1 (mEq/L)   Chloride 105  96 - 112 (mEq/L)   CO2 23  19 - 32 (mEq/L)   Glucose, Bld 90  70 - 99 (mg/dL)   BUN 8  6 - 23 (mg/dL)   Creatinine, Ser 8.11 (*) 0.50 - 1.10 (mg/dL)   Calcium 9.5  8.4 - 91.4 (mg/dL)   GFR calc non Af Amer >90  >90 (mL/min)   GFR calc Af Amer >90  >90 (mL/min)    Signed: Genella Mech 03/07/2012, 2:07 PM

## 2012-03-08 LAB — CULTURE, BLOOD (ROUTINE X 2): Culture  Setup Time: 201303101124

## 2012-03-08 MED ORDER — FLUCONAZOLE 100 MG PO TABS: ORAL_TABLET | ORAL | Status: DC

## 2012-03-08 MED ORDER — OXYCODONE HCL 5 MG PO TABS: 5.0000 mg | ORAL_TABLET | ORAL | Status: DC | PRN

## 2012-03-08 NOTE — Progress Notes (Signed)
Subjective: She is awake and alert. We had a nice conversation about discharging her back to [place]- she states that she is ready but she would like to stay here in [facility] as she feels more comfortable  " like a family".Denies any abdominal pain, nausea or vomiting.  Breakfast tray was sitting next to her and she stated that she continue to have poor appetite.  Objective: Vital signs in last 24 hours: Filed Vitals:   03/07/12 0522 03/07/12 1427 03/07/12 2150 03/08/12 0617  BP: 130/79 122/60 121/70 141/83  Pulse: 93 94 93 98  Temp: 98.7 F (37.1 C) 97.4 F (36.3 C) 98.7 F (37.1 C) 98.7 F (37.1 C)  TempSrc:      Resp: 18 16 20 20   Height:      Weight:      SpO2: 98% 98% 97% 99%   Weight change:   Intake/Output Summary (Last 24 hours) at 03/08/12 1046 Last data filed at 03/08/12 0700  Gross per 24 hour  Intake    485 ml  Output   2050 ml  Net  -1565 ml   Physical Exam: General: more awake today and chats, understands what I am saying HEENT: is able to open her eyes and follows me with them while we talk Cardiac: S 1 S 2 heard, no overt murmurs heard Pulm: good air movement heard bilaterally Abd: lower abdomen was slightly hard,non-tender to deep palpation, normal BS Ext: warm and well perfused, mild pedal edema, heels are in protective holster to prevent bed sores, no tenderness to palpation Ext: arms seem to be less swollen although she does not move her left hand at all and cannot squeeze my hand, weak squeeze with the right, pulses intact bilaterally and color good and warm  Backside: There are multiple bed sores on her buttocks Neuro: is oriented to person and place but not specific date, knows she is in the hospital   Lab Results: Basic Metabolic Panel:  Lab 03/07/12 1610 03/06/12 0630 03/02/12 0002  NA 136 135 --  K 4.0 5.0 --  CL 105 106 --  CO2 23 22 --  GLUCOSE 90 82 --  BUN 8 12 --  CREATININE 0.46* 0.52 --  CALCIUM 9.5 9.6 --  MG -- -- 1.9  PHOS -- --  --   CBC:  Lab 03/07/12 0750 03/06/12 1201  WBC 8.3 9.6  NEUTROABS -- --  HGB 10.3* 9.5*  HCT 32.4* 29.5*  MCV 77.5* 77.4*  PLT 338 363   Cardiac Enzymes:  Lab 03/01/12 2330  CKTOTAL 8  CKMB 0.9  CKMBINDEX --  TROPONINI <0.30   CBG:  Lab 03/01/12 2242  GLUCAP 125*   Micro Results: Recent Results (from the past 240 hour(s))  CULTURE, BLOOD (ROUTINE X 2)     Status: Normal   Collection Time   02/27/12  7:33 PM      Component Value Range Status Comment   Specimen Description BLOOD RIGHT HAND   Final    Special Requests BOTTLES DRAWN AEROBIC AND ANAEROBIC 10CC   Final    Culture  Setup Time [id number]   Final    Culture NO GROWTH 5 DAYS   Final    Report Status 03/05/2012 FINAL   Final   CULTURE, BLOOD (ROUTINE X 2)     Status: Normal   Collection Time   02/27/12  7:47 PM      Component Value Range Status Comment   Specimen Description BLOOD LEFT HAND   Final  Special Requests BOTTLES DRAWN AEROBIC AND ANAEROBIC 10CC   Final    Culture  Setup Time [id number]   Final    Culture NO GROWTH 5 DAYS   Final    Report Status 03/05/2012 FINAL   Final   CULTURE, BLOOD (ROUTINE X 2)     Status: Normal   Collection Time   03/01/12 12:29 AM      Component Value Range Status Comment   Specimen Description BLOOD LEFT HAND   Final    Special Requests BOTTLES DRAWN AEROBIC ONLY 5CC   Final    Culture  Setup Time [id number]   Final    Culture NO GROWTH 5 DAYS   Final    Report Status 03/08/2012 FINAL   Final   CULTURE, BLOOD (ROUTINE X 2)     Status: Normal   Collection Time   03/02/12 12:29 AM      Component Value Range Status Comment   Specimen Description BLOOD RIGHT ARM   Final    Special Requests     Final    Value: BOTTLES DRAWN AEROBIC AND ANAEROBIC 10CC BLUE,5CC RED   Culture  Setup Time [id number]   Final    Culture     Final    Value: STAPHYLOCOCCUS SPECIES (COAGULASE NEGATIVE)     Note: THE SIGNIFICANCE OF ISOLATING THIS ORGANISM FROM A SINGLE VENIPUNCTURE  CANNOT BE PREDICTED WITHOUT FURTHER CLINICAL AND CULTURE CORRELATION. SUSCEPTIBILITIES AVAILABLE ONLY ON REQUEST.     Note: Gram Stain Report Called to,Read Back By and Verified With: WILLIE COLLINS 03/03/12 @1326  BY KRAWS   Report Status 03/04/2012 FINAL   Final   URINE CULTURE     Status: Normal   Collection Time   03/02/12  3:51 AM      Component Value Range Status Comment   Specimen Description URINE, CATHETERIZED   Final    Special Requests NONE   Final    Culture  Setup Time [id number]   Final    Colony Count NO GROWTH   Final    Culture NO GROWTH   Final    Report Status 03/03/2012 FINAL   Final   CULTURE, BLOOD (ROUTINE X 2)     Status: Normal (Preliminary result)   Collection Time   03/04/12 12:45 PM      Component Value Range Status Comment   Specimen Description BLOOD LEFT ARM   Final    Special Requests BOTTLES DRAWN AEROBIC AND ANAEROBIC 10CC   Final    Culture  Setup Time [id number]   Final    Culture     Final    Value:        BLOOD CULTURE RECEIVED NO GROWTH TO DATE CULTURE WILL BE HELD FOR 5 DAYS BEFORE ISSUING A FINAL NEGATIVE REPORT   Report Status PENDING   Incomplete   CULTURE, BLOOD (ROUTINE X 2)     Status: Normal (Preliminary result)   Collection Time   03/04/12  1:02 PM      Component Value Range Status Comment   Specimen Description BLOOD LEFT ARM   Final    Special Requests BOTTLES DRAWN AEROBIC AND ANAEROBIC 10CC   Final    Culture  Setup Time [id number]   Final    Culture     Final    Value:        BLOOD CULTURE RECEIVED NO GROWTH TO DATE CULTURE WILL BE HELD FOR 5 DAYS BEFORE ISSUING A FINAL NEGATIVE REPORT   Report Status  PENDING   Incomplete    Studies/Results: Ct Pelvis W Contrast  03/06/2012  *RADIOLOGY REPORT*  Clinical Data:  Bladder rupture.  CT PELVIS WITH CONTRAST  Technique:  Multidetector CT imaging of the pelvis was performed using the standard protocol following the bolus administration of intravenous contrast.  Contrast:  OMNIPAQUE IOHEXOL 300 MG/ML IJ SOLN IV and 450 ml of sodium chloride and 50 ml of Omnipaque-300 instilled in the bladder through the Foley catheter.  Comparison:  CT scan dated 03/06/2011  Findings:  The CT cystogram demonstrates the patient has an 11 x 13 mm hole in the anterior-superior aspect of the bladder slightly to the left of midline.  The bladder wall is thickened.  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  Contrast extravasates into a contained area anterior to the bladder extending to the right and left of midline. This area measures approximately 20 x 4.5 x 5.5 cm.  There are small areas of air and extravasated contrast extending anteriorly just under the rectus muscles.  There is an oblong fluid collection in the pelvis posterior to the bladder which does not fill with contrast and probably represents a pelvic abscess.  It measures 9 x 4 x 6 cm. This abscess appears to extend to involve the left ovary. The uterus is atrophic with a 2.3 cm calcified fibroid within it.  There is slight thickening of the wall of the transverse colon as it extends along the superior aspect of the contained bladder rupture.  IMPRESSION:  1.  Perforation of the superior anterior left lateral aspect of the bladder with a relatively contained  area of contrast extravasation anterior to the bladder and extending to the right  and left of midline. 2.  Probable adjacent pelvic abscess just posterior and superior to the bladder asymmetric to the left. 3.  No discrete tumor.  Original Report Authenticated By: Gwynn Burly, M.D.   Medications: I have reviewed the patient's current medications. Scheduled Meds:    . amitriptyline  100 mg Oral QHS  . collagenase   Topical Daily  . docusate sodium  100 mg Oral BID  . feeding supplement  237 mL Oral TID BM  . fluconazole  100 mg Oral Daily  . FLUoxetine  40 mg Oral Daily  . mulitivitamin with minerals  1 tablet Oral Daily  .  oxybutynin  5 mg Oral BID  . polyethylene glycol  17 g Oral Daily  . sucralfate  1 g Oral TID WC & HS   Continuous Infusions:    . sodium chloride 10 mL/hr at 03/02/12 2245  . sodium chloride 125 mL/hr at 03/08/12 0235   PRN Meds:.acetaminophen, acetaminophen, ondansetron (ZOFRAN) IV, ondansetron, oxyCODONE  Assessment/Plan:  UTI (lower urinary tract infection) - Have switched her back to PO diflucan and will continue for total of 14 days of treatment. Today is day 11 of treatment so will need additional 3 days of treatment. Repeat urine culture is negative.  - Continue diflucan - OOB to chair.   Bladder rupture - Found on CT abdomen pelvis looking for source of hardness in the abdomen. Urology did see her yesterday and get CT cystogram. Appreciate urology following along! -Did replace foley with larger size to accommodate for some particulate drainage and now blocking flow. If flow obstructs again should be replaced otherwise no further treatment and it should resolve on its own.  -Foley still in place and draining. - Would follow temps and  if she develops abscess , then would require percutaneous drainage but the anticipation is to be healing on its own. -She will follow up with her urologist as an out-patient.   ?Bacteremia - Speciation was coagulase negative staph- so are treating with 7 days of vancomycin per ID recommendations as it was not clear if it was a contaminant versus true source of her fevers. She continues to be afebrile. As her level of vancomycin was supratherapeutic will hold dosing and consider treatment completed as it will be therapeutic for several more days. Will note clearly in D/C summary that if recurrent coag neg bacteremia may consider TEE to evaluate for endocarditis. Repeat blood cultures from 3/12 Day 3 are no growth so far. Will continue to follow.   PRESSURE ULCER HIP -  Wound care consult to care for her decubitus ulcers on her backside. Heels are in  protective holsters to prevent ulcer.    Normocytic anemia - Likely due to chronic disease and appears to be stable around 9 although we have been watching closely as she was having incident of vomiting blood. Did receive one unit of blood during stay and blood counts have been stable since then. Possibly due to the hematemesis and now resolved. H and H stable.   AKI - resolved. Her elevated Cr was thought to be likely secondary to vanc overlaod. - Continue to monitor.  Metabolic encephalopathy - Is now back at baseline and stable.  Disposition - D/C to SNF when bed is available. She would follow up with Dr. Patsi Sears as an outpatient.    LOS: 13 days   Yoshino Broccoli 03/08/2012, 10:46 AM

## 2012-03-08 NOTE — Progress Notes (Signed)
Pt to return to [facility] today via [facility]. Pt, pt's dtr, and SNF aware of d/c. No other CSW needs. CSW signing off. Dellie Burns, MSW, [place] [phone] (weekend)

## 2012-03-10 LAB — CULTURE, BLOOD (ROUTINE X 2): Culture  Setup Time: 201303121712

## 2012-03-19 ENCOUNTER — Encounter (HOSPITAL_COMMUNITY): Payer: Self-pay | Admitting: Family Medicine

## 2012-03-19 ENCOUNTER — Emergency Department (HOSPITAL_COMMUNITY)
Admission: EM | Admit: 2012-03-19 | Discharge: 2012-03-19 | Disposition: A | Discharge status: Skilled Nursing Facility | Payer: Medicare (Managed Care) | Attending: Emergency Medicine | Admitting: Emergency Medicine

## 2012-03-19 ENCOUNTER — Emergency Department (HOSPITAL_COMMUNITY): Payer: Medicare (Managed Care)

## 2012-03-19 DIAGNOSIS — R5381 Other malaise: Secondary | ICD-10-CM | POA: Insufficient documentation

## 2012-03-19 DIAGNOSIS — F3289 Other specified depressive episodes: Secondary | ICD-10-CM | POA: Insufficient documentation

## 2012-03-19 DIAGNOSIS — Z79899 Other long term (current) drug therapy: Secondary | ICD-10-CM | POA: Insufficient documentation

## 2012-03-19 DIAGNOSIS — F329 Major depressive disorder, single episode, unspecified: Secondary | ICD-10-CM | POA: Insufficient documentation

## 2012-03-19 DIAGNOSIS — N390 Urinary tract infection, site not specified: Secondary | ICD-10-CM | POA: Insufficient documentation

## 2012-03-19 DIAGNOSIS — G35 Multiple sclerosis: Secondary | ICD-10-CM | POA: Insufficient documentation

## 2012-03-19 DIAGNOSIS — K219 Gastro-esophageal reflux disease without esophagitis: Secondary | ICD-10-CM | POA: Insufficient documentation

## 2012-03-19 DIAGNOSIS — R404 Transient alteration of awareness: Secondary | ICD-10-CM | POA: Insufficient documentation

## 2012-03-19 DIAGNOSIS — R509 Fever, unspecified: Secondary | ICD-10-CM | POA: Insufficient documentation

## 2012-03-19 DIAGNOSIS — R4789 Other speech disturbances: Secondary | ICD-10-CM | POA: Insufficient documentation

## 2012-03-19 DIAGNOSIS — R000 Tachycardia, unspecified: Secondary | ICD-10-CM | POA: Insufficient documentation

## 2012-03-19 LAB — CBC
MCH: 23.9 pg — ABNORMAL LOW (ref 26.0–34.0)
MCHC: 30.9 g/dL (ref 30.0–36.0)
MCV: 77.5 fL — ABNORMAL LOW (ref 78.0–100.0)
Platelets: 343 10*3/uL (ref 150–400)
RDW: 19 % — ABNORMAL HIGH (ref 11.5–15.5)

## 2012-03-19 LAB — URINE MICROSCOPIC-ADD ON

## 2012-03-19 LAB — URINALYSIS, ROUTINE W REFLEX MICROSCOPIC
Glucose, UA: NEGATIVE mg/dL
Ketones, ur: NEGATIVE mg/dL
Nitrite: POSITIVE — AB
Specific Gravity, Urine: 1.027 (ref 1.005–1.030)
pH: 5.5 (ref 5.0–8.0)

## 2012-03-19 LAB — COMPREHENSIVE METABOLIC PANEL
ALT: 34 U/L (ref 0–35)
AST: 64 U/L — ABNORMAL HIGH (ref 0–37)
Calcium: 11.2 mg/dL — ABNORMAL HIGH (ref 8.4–10.5)
Creatinine, Ser: 0.65 mg/dL (ref 0.50–1.10)
GFR calc Af Amer: >90 mL/min (ref >90)
Sodium: 135 mEq/L (ref 135–145)
Total Protein: 6.3 g/dL (ref 6.0–8.3)

## 2012-03-19 LAB — DIFFERENTIAL
Basophils Absolute: 0 10*3/uL (ref 0.0–0.1)
Eosinophils Absolute: 0.1 10*3/uL (ref 0.0–0.7)
Eosinophils Relative: 1 % (ref 0–5)
Monocytes Absolute: 1.3 10*3/uL — ABNORMAL HIGH (ref 0.1–1.0)

## 2012-03-19 LAB — GLUCOSE, CAPILLARY: Glucose-Capillary: 90 mg/dL (ref 70–99)

## 2012-03-19 MED ORDER — SODIUM CHLORIDE 0.9 % IV BOLUS (SEPSIS)
1000.0000 mL | Freq: Once | INTRAVENOUS | Status: AC
  Administered 2012-03-19: 1000 mL via INTRAVENOUS

## 2012-03-19 MED ORDER — ACETAMINOPHEN 650 MG RE SUPP
650.0000 mg | Freq: Once | RECTAL | Status: AC
  Administered 2012-03-19: 650 mg via RECTAL
  Filled 2012-03-19: qty 1

## 2012-03-19 MED ORDER — SODIUM CHLORIDE 0.9 % IV SOLN
Freq: Once | INTRAVENOUS | Status: AC
  Administered 2012-03-19: 1000 mL via INTRAVENOUS

## 2012-03-19 MED ORDER — DEXTROSE 5 % IV SOLN
1.0000 g | Freq: Once | INTRAVENOUS | Status: AC
  Administered 2012-03-19: 1 g via INTRAVENOUS
  Filled 2012-03-19: qty 10

## 2012-03-19 NOTE — ED Notes (Signed)
ATTEMPTED LINE X2 AFTER IV TEAM TRIED TO ESTABLISH ACCESS.  NO SUCCESS.  DR. HUNT NOTIFIED.

## 2012-03-19 NOTE — ED Notes (Signed)
Pt from [place]. Reports possible UTI, catheter in place.  Increased lethargy.

## 2012-03-19 NOTE — Progress Notes (Signed)
8:34 PM Pt had received IV fluids and IV Rocephin, was to go back to [place], but now is somnolent.  I have tried twice to awaken her without success.  Call to Dr. [name] --> if pt remains somnolent will need admission.  9:43 PM  Pt now awake and alert.  BP low at 84 systolic.  Will give another liter of normal saline and recheck.  10:50 PM BP 95 systolic, pt readily arousable.  Released back to [place].

## 2012-03-19 NOTE — ED Provider Notes (Signed)
History     CSN: [id number]  Arrival date & time 03/19/12  1343   First MD Initiated Contact with Patient 03/19/12 1400      Chief Complaint  Patient presents with  . Urinary Tract Infection  . Fatigue    (Consider location/radiation/quality/duration/timing/severity/associated sxs/prior treatment) HPI Patient is a 71 year old female with complicated past medical history including multiple sclerosis as well as femur fracture with numerous hospitalizations since this for urinary tract infections. I actually saw her at her last admission for sepsis. During that hospitalization it was identified that patient actually had a bladder perforation. She is now followed by Dr. Cassell Smiles from urology and has a constantly indwelling Foley for drainage of her urine and debris from the abscesses that have developed from this. Patient came today from [facility] nursing facility because of fever and some increasing fatigue and somnolence. Patient had rectal temp of 100.6 here and was tachycardic to 108. She was not hypotensive. Patient was able to me her name and respond to my questions with somewhat slurred speech. There were concerns at the nursing facility about decreased responsiveness and this appears to be an improvement compared to what was reported to Dr. Modena Jansky her primary care physician. History is otherwise limited secondary to patient's ability to communicate.  Past Medical History  Diagnosis Date  . Multiple sclerosis   . GERD (gastroesophageal reflux disease)   . Depression   . Recurrent UTI   . Recurrent pneumonia   . Chronic constipation   . Diverticulosis     Past Surgical History  Procedure Date  . Cholecystectomy   . Orif humeral shaft fracture 01/2011  . Video assisted thoracoscopy 06/2010  . Closed reduction shoulder dislocation 04/2010    Fracture dislocation  . Femur im nail 01/13/2012    Procedure: INTRAMEDULLARY (IM) RETROGRADE FEMORAL NAILING;  Surgeon: Thera Flake., MD;   Location: [facility] OR;  Service: Orthopedics;  Laterality: Left;  . Femoral artery exploration 01/13/2012    Procedure: FEMORAL ARTERY EXPLORATION;  Surgeon: Pryor Ochoa, MD;  Location: [facility] OR;  Service: Vascular;  Laterality: Left;    History reviewed. No pertinent family history.  History  Substance Use Topics  . Smoking status: Never Smoker   . Smokeless tobacco: Never Used  . Alcohol Use: No    OB History    Grav Para Term Preterm Abortions TAB SAB Ect Mult Living                  Review of Systems  Unable to perform ROS: Other    Allergies  Review of patient's allergies indicates no known allergies.  Home Medications   Current Outpatient Rx  Name Route Sig Dispense Refill  . AMITRIPTYLINE HCL 100 MG PO TABS Oral Take 100 mg by mouth at bedtime.    Marland Kitchen BISACODYL 5 MG PO TBEC Oral Take 2 tablets (10 mg total) by mouth daily as needed. 30 tablet 0  . DOCUSATE SODIUM 100 MG PO CAPS Oral Take 200 mg by mouth 2 (two) times daily.      Marland Kitchen ENSURE PUDDING PO PUDG Oral Take 1 Container by mouth 2 (two) times daily between meals. 150 Can 1  . PRO-STAT 64 PO LIQD Oral Take 30 mLs by mouth 2 (two) times daily.    Marland Kitchen FERROUS SULFATE 325 (65 FE) MG PO TABS Oral Take 325 mg by mouth 2 (two) times daily with a meal.    . FLUCONAZOLE 100 MG PO TABS  Take  1  Tab by mouth daily till 3/20 4 tablet 0  . FLUOXETINE HCL 40 MG PO CAPS Oral Take 40 mg by mouth daily.      Marland Kitchen GABAPENTIN 300 MG PO CAPS Oral Take 2 capsules (600 mg total) by mouth 3 (three) times daily. 90 capsule 1  . THERA M PLUS PO TABS Oral Take 1 tablet by mouth daily.      Marland Kitchen NAPROXEN SODIUM 275 MG PO TABS Oral Take 275 mg by mouth 2 (two) times daily with a meal. For inflammation    . OMEPRAZOLE 20 MG PO CPDR Oral Take 20 mg by mouth 2 (two) times daily.      . OXYCODONE HCL 5 MG PO TABS Oral Take 1 tablet (5 mg total) by mouth every 4 (four) hours as needed for pain. pain 20 tablet 0  . VITAMIN C 500 MG PO TABS Oral Take 500 mg by  mouth 2 (two) times daily.        BP 125/75  Pulse 108  Temp(Src) 100.6 F (38.1 C) (Rectal)  Resp 18  SpO2 96%  Physical Exam  Nursing note and vitals reviewed. Constitutional: She appears well-developed. No distress.       Chronically ill appearing and debilitated  HENT:  Head: Normocephalic and atraumatic.  Eyes: Conjunctivae and EOM are normal. Pupils are equal, round, and reactive to light.  Cardiovascular: Regular rhythm, normal heart sounds and intact distal pulses.  Exam reveals no gallop and no friction rub.   No murmur heard. Pulmonary/Chest: Effort normal and breath sounds normal. No respiratory distress. She has no wheezes. She has no rales.  Abdominal: Soft. Bowel sounds are normal. She exhibits no distension. There is no tenderness. There is no rebound and no guarding.  Genitourinary:       Large-bore Foley catheter in place  Musculoskeletal: She exhibits no edema.  Neurological:       Patient is somnolent but easily arouses to voice. She's able to tell me her name with slurred speech. Patient is able to communicate meaningfully with me. When asked if she has a fever patient responds "not that I know of". This is closer to her neurologic baseline the previously reported by nursing facility.  Skin: Skin is warm and dry. No rash noted.    ED Course  Procedures (including critical care time)  Labs Reviewed  CBC - Abnormal; Notable for the following:    Hemoglobin 11.9 (*)    MCV 77.5 (*)    MCH 23.9 (*)    RDW 19.0 (*)    All other components within normal limits  DIFFERENTIAL - Abnormal; Notable for the following:    Monocytes Relative 13 (*)    Monocytes Absolute 1.3 (*)    All other components within normal limits  COMPREHENSIVE METABOLIC PANEL - Abnormal; Notable for the following:    BUN 31 (*)    Calcium 11.2 (*)    Albumin 2.4 (*)    AST 64 (*) SLIGHT HEMOLYSIS   Alkaline Phosphatase 303 (*)    GFR calc non Af Amer 88 (*)    All other components  within normal limits  URINALYSIS, ROUTINE W REFLEX MICROSCOPIC - Abnormal; Notable for the following:    Color, Urine AMBER (*) BIOCHEMICALS MAY BE AFFECTED BY COLOR   APPearance TURBID (*)    Hgb urine dipstick LARGE (*)    Protein, ur 100 (*)    Nitrite POSITIVE (*)    Leukocytes, UA LARGE (*)  All other components within normal limits  URINE MICROSCOPIC-ADD ON - Abnormal; Notable for the following:    Bacteria, UA MANY (*)    Casts HYALINE CASTS (*)    Crystals CA OXALATE CRYSTALS (*)    All other components within normal limits  CULTURE, BLOOD (ROUTINE X 2)  CULTURE, BLOOD (ROUTINE X 2)  URINE CULTURE   No results found.   1. UTI (urinary tract infection)       MDM  Patient was evaluated by myself. I discussed the patient with Dr. [name] of the [facility] who is her primary care physician. We agreed that patient workup today to be limited to basic blood work as well as urinalysis and blood and urine cultures. Patient did have significant dehydration as well as urinary tract infection. IV was placed and patient had 2 L of normal saline IV as well as Rocephin ordered. I discussed all the patient's findings with Dr. [name]. He agreed to patient being discharged back to [facility] following completion of these therapies. He will place orders there for patient received IM Rocephin and oral Cipro. Nursing staff and oncoming physician have been notified of plan. Patient did have one temperature 100.6 rectally and received Tylenol for this. All of her vital signs have remained stable. Patient continues have mild tachycardia which I suspect will improve following IV hydration. Patient will be discharged following completion of these therapies.        Cyndra Numbers, MD 03/19/12 [phone]

## 2012-03-19 NOTE — ED Notes (Signed)
IV RN at bedside 

## 2012-03-19 NOTE — ED Notes (Signed)
Patient arousable at this time. Patient speaking and answering  Question at this time. Respirations equal and unlabored. Skin warm and dry. No acute distress noted at this time will continue to monitor patient.

## 2012-03-19 NOTE — ED Notes (Signed)
Dr. Radford Pax at bedside for IV start with [place]

## 2012-03-19 NOTE — ED Notes (Signed)
Attempt x2 by this RN to start IV unsuccessfully. IV team paged.

## 2012-03-19 NOTE — ED Notes (Signed)
Patient discharged via stretcher with [facility]. Respirations equal and unlabored. Skin warm and dry. No acute distress noted.

## 2012-03-19 NOTE — Discharge Instructions (Signed)
[name] [name] had physical examination and laboratory tests that showed a urinary tract infection.  She received IV fluids and IV Rocephin.  Her case was discussed with Dr. Marny Lowenstein.  Released back to [place].

## 2012-03-21 LAB — URINE CULTURE
Colony Count: >=100000
Culture  Setup Time: 201303280307
Special Requests: NORMAL

## 2012-03-22 NOTE — ED Notes (Signed)
+   Urine Chart sent to EDP office for review. 

## 2012-03-24 NOTE — ED Notes (Addendum)
Pt on Rocephin which is sensitive and Cipro which is resistant-@a  SNF.Should be ok. I would suggest contacting pt's PCP Dr [name], to make sure he's aware of the results  Per Grant Fontana.Patietnt returned to ED on 03/25/2012

## 2012-03-25 ENCOUNTER — Encounter (HOSPITAL_COMMUNITY): Payer: Self-pay | Admitting: Emergency Medicine

## 2012-03-25 ENCOUNTER — Emergency Department (HOSPITAL_COMMUNITY): Payer: Medicare (Managed Care)

## 2012-03-25 ENCOUNTER — Other Ambulatory Visit: Payer: Self-pay

## 2012-03-25 ENCOUNTER — Inpatient Hospital Stay (HOSPITAL_COMMUNITY)
Admission: EM | Admit: 2012-03-25 | Discharge: 2012-04-01 | DRG: 698 | Disposition: A | Discharge status: Skilled Nursing Facility | Payer: Medicare (Managed Care) | Source: Ambulatory Visit | Attending: Internal Medicine | Admitting: Internal Medicine

## 2012-03-25 DIAGNOSIS — Y9210 Unspecified residential institution as the place of occurrence of the external cause: Secondary | ICD-10-CM | POA: Diagnosis present

## 2012-03-25 DIAGNOSIS — D638 Anemia in other chronic diseases classified elsewhere: Secondary | ICD-10-CM | POA: Diagnosis present

## 2012-03-25 DIAGNOSIS — L8992 Pressure ulcer of unspecified site, stage 2: Secondary | ICD-10-CM | POA: Diagnosis present

## 2012-03-25 DIAGNOSIS — A411 Sepsis due to other specified staphylococcus: Secondary | ICD-10-CM | POA: Diagnosis present

## 2012-03-25 DIAGNOSIS — L89109 Pressure ulcer of unspecified part of back, unspecified stage: Secondary | ICD-10-CM | POA: Diagnosis present

## 2012-03-25 DIAGNOSIS — R197 Diarrhea, unspecified: Secondary | ICD-10-CM | POA: Diagnosis not present

## 2012-03-25 DIAGNOSIS — N390 Urinary tract infection, site not specified: Secondary | ICD-10-CM | POA: Diagnosis present

## 2012-03-25 DIAGNOSIS — F329 Major depressive disorder, single episode, unspecified: Secondary | ICD-10-CM | POA: Diagnosis present

## 2012-03-25 DIAGNOSIS — T83511A Infection and inflammatory reaction due to indwelling urethral catheter, initial encounter: Principal | ICD-10-CM | POA: Diagnosis present

## 2012-03-25 DIAGNOSIS — Z66 Do not resuscitate: Secondary | ICD-10-CM | POA: Diagnosis present

## 2012-03-25 DIAGNOSIS — K651 Peritoneal abscess: Secondary | ICD-10-CM | POA: Diagnosis present

## 2012-03-25 DIAGNOSIS — Z79899 Other long term (current) drug therapy: Secondary | ICD-10-CM

## 2012-03-25 DIAGNOSIS — Z880 Allergy status to penicillin: Secondary | ICD-10-CM

## 2012-03-25 DIAGNOSIS — E669 Obesity, unspecified: Secondary | ICD-10-CM | POA: Diagnosis present

## 2012-03-25 DIAGNOSIS — L89899 Pressure ulcer of other site, unspecified stage: Secondary | ICD-10-CM | POA: Diagnosis present

## 2012-03-25 DIAGNOSIS — G9341 Metabolic encephalopathy: Secondary | ICD-10-CM | POA: Diagnosis present

## 2012-03-25 DIAGNOSIS — L8995 Pressure ulcer of unspecified site, unstageable: Secondary | ICD-10-CM | POA: Diagnosis present

## 2012-03-25 DIAGNOSIS — L89209 Pressure ulcer of unspecified hip, unspecified stage: Secondary | ICD-10-CM | POA: Diagnosis present

## 2012-03-25 DIAGNOSIS — N3289 Other specified disorders of bladder: Secondary | ICD-10-CM | POA: Diagnosis present

## 2012-03-25 DIAGNOSIS — E860 Dehydration: Secondary | ICD-10-CM | POA: Diagnosis present

## 2012-03-25 DIAGNOSIS — K219 Gastro-esophageal reflux disease without esophagitis: Secondary | ICD-10-CM | POA: Diagnosis present

## 2012-03-25 DIAGNOSIS — F3289 Other specified depressive episodes: Secondary | ICD-10-CM | POA: Diagnosis present

## 2012-03-25 DIAGNOSIS — K5730 Diverticulosis of large intestine without perforation or abscess without bleeding: Secondary | ICD-10-CM | POA: Diagnosis present

## 2012-03-25 DIAGNOSIS — A419 Sepsis, unspecified organism: Secondary | ICD-10-CM | POA: Diagnosis present

## 2012-03-25 DIAGNOSIS — E876 Hypokalemia: Secondary | ICD-10-CM | POA: Diagnosis present

## 2012-03-25 DIAGNOSIS — Y846 Urinary catheterization as the cause of abnormal reaction of the patient, or of later complication, without mention of misadventure at the time of the procedure: Secondary | ICD-10-CM | POA: Diagnosis present

## 2012-03-25 DIAGNOSIS — L89509 Pressure ulcer of unspecified ankle, unspecified stage: Secondary | ICD-10-CM | POA: Diagnosis present

## 2012-03-25 DIAGNOSIS — IMO0002 Reserved for concepts with insufficient information to code with codable children: Secondary | ICD-10-CM | POA: Diagnosis present

## 2012-03-25 DIAGNOSIS — L8991 Pressure ulcer of unspecified site, stage 1: Secondary | ICD-10-CM | POA: Diagnosis present

## 2012-03-25 DIAGNOSIS — K5900 Constipation, unspecified: Secondary | ICD-10-CM | POA: Diagnosis present

## 2012-03-25 DIAGNOSIS — R4182 Altered mental status, unspecified: Secondary | ICD-10-CM

## 2012-03-25 DIAGNOSIS — L89309 Pressure ulcer of unspecified buttock, unspecified stage: Secondary | ICD-10-CM | POA: Diagnosis present

## 2012-03-25 DIAGNOSIS — G35 Multiple sclerosis: Secondary | ICD-10-CM | POA: Diagnosis present

## 2012-03-25 LAB — URINE MICROSCOPIC-ADD ON

## 2012-03-25 LAB — URINALYSIS, ROUTINE W REFLEX MICROSCOPIC
Glucose, UA: NEGATIVE mg/dL
Protein, ur: 30 mg/dL — AB
Specific Gravity, Urine: 1.02 (ref 1.005–1.030)

## 2012-03-25 LAB — BASIC METABOLIC PANEL
BUN: 23 mg/dL (ref 6–23)
Creatinine, Ser: 0.68 mg/dL (ref 0.50–1.10)
GFR calc non Af Amer: 87 mL/min — ABNORMAL LOW (ref >90)
Glucose, Bld: 89 mg/dL (ref 70–99)
Potassium: 4.5 mEq/L (ref 3.5–5.1)

## 2012-03-25 LAB — DIFFERENTIAL
Eosinophils Absolute: 0.1 10*3/uL (ref 0.0–0.7)
Lymphs Abs: 2.6 10*3/uL (ref 0.7–4.0)
Monocytes Absolute: 0.9 10*3/uL (ref 0.1–1.0)
Monocytes Relative: 14 % — ABNORMAL HIGH (ref 3–12)
Neutrophils Relative %: 44 % (ref 43–77)

## 2012-03-25 LAB — CULTURE, BLOOD (ROUTINE X 2)
Culture  Setup Time: 201303272303
Culture: NO GROWTH

## 2012-03-25 LAB — CBC
HCT: 36.8 % (ref 36.0–46.0)
Hemoglobin: 11.7 g/dL — ABNORMAL LOW (ref 12.0–15.0)
MCH: 24 pg — ABNORMAL LOW (ref 26.0–34.0)
MCV: 75.4 fL — ABNORMAL LOW (ref 78.0–100.0)
RBC: 4.88 MIL/uL (ref 3.87–5.11)

## 2012-03-25 LAB — PROCALCITONIN: Procalcitonin: 0.42 ng/mL

## 2012-03-25 MED ORDER — DEXTROSE 5 % IV SOLN
1.0000 g | Freq: Once | INTRAVENOUS | Status: AC
  Administered 2012-03-25: 1 g via INTRAVENOUS
  Filled 2012-03-25: qty 10

## 2012-03-25 MED ORDER — SODIUM CHLORIDE 0.9 % IV BOLUS (SEPSIS)
1000.0000 mL | Freq: Once | INTRAVENOUS | Status: AC
  Administered 2012-03-25: 1000 mL via INTRAVENOUS

## 2012-03-25 MED ORDER — ACETAMINOPHEN 650 MG RE SUPP
650.0000 mg | Freq: Once | RECTAL | Status: AC
  Administered 2012-03-25: 650 mg via RECTAL
  Filled 2012-03-25: qty 1

## 2012-03-25 MED ORDER — SODIUM CHLORIDE 0.9 % IV SOLN
Freq: Once | INTRAVENOUS | Status: AC
  Administered 2012-03-25: 23:00:00 via INTRAVENOUS

## 2012-03-25 NOTE — ED Notes (Signed)
Pt from [place] with complaints of Altered mental status and Urinary tract infection. Pt was dx on 3/27 with UTI, EMS states pt was suppose to be receiving antibiotics and fluids. Pt was reassessed by MD with reports to send pt back to ED for further assessment. Unknown if pt received any antibiotics and facility reports pt has fever of 101.0 oral, pt feels hot to touch per EMS. Pt has 22g in R hand by EMS with of NS.

## 2012-03-25 NOTE — ED Provider Notes (Signed)
History     CSN: [id number]  Arrival date & time 03/25/12  2011   First MD Initiated Contact with Patient 03/25/12 2036      Chief Complaint  Patient presents with  . Urinary Tract Infection  . Altered Mental Status    (Consider location/radiation/quality/duration/timing/severity/associated sxs/prior treatment) HPI History provided by patient's son and prior chart.  Level 5 caveat applies d/t ALOC. Per prior chart, pt has h/o MS, was admitted for femur fracture in 01/2012 and multiple times since for UTI.  Was treated for urosepsis during 3/3 admission and CT at that time revealed a bladder perforation for which she now has a chronic indwelling foley and is followed by Dr. Patsi Sears.  Patient's son reports that she has been living at [facility] and friends of the family noted [facility] yesterday.  She was unresponsive when he visited her today.  Her PCP, Dr. [name], was consulted, he treated her with IV fluids but then referred to ER when she did not improve.  Patient's son had most recently seen her 2 days ago and she was at her baseline mentation at that time.  Nursing home staff has not reported any recent illnesses.  No known head trauma.    Past Medical History  Diagnosis Date  . Multiple sclerosis   . GERD (gastroesophageal reflux disease)   . Depression   . Recurrent UTI   . Recurrent pneumonia   . Chronic constipation   . Diverticulosis     Past Surgical History  Procedure Date  . Cholecystectomy   . Orif humeral shaft fracture 01/2011  . Video assisted thoracoscopy 06/2010  . Closed reduction shoulder dislocation 04/2010    Fracture dislocation  . Femur im nail 01/13/2012    Procedure: INTRAMEDULLARY (IM) RETROGRADE FEMORAL NAILING;  Surgeon: Thera Flake., MD;  Location: [facility] OR;  Service: Orthopedics;  Laterality: Left;  . Femoral artery exploration 01/13/2012    Procedure: FEMORAL ARTERY EXPLORATION;  Surgeon: Pryor Ochoa, MD;  Location: [facility] OR;  Service: Vascular;   Laterality: Left;    No family history on file.  History  Substance Use Topics  . Smoking status: Never Smoker   . Smokeless tobacco: Never Used  . Alcohol Use: No    OB History    Grav Para Term Preterm Abortions TAB SAB Ect Mult Living                  Review of Systems  Unable to perform ROS: Mental status change    Allergies  Penicillins  Home Medications   Current Outpatient Rx  Name Route Sig Dispense Refill  . AMITRIPTYLINE HCL 100 MG PO TABS Oral Take 100 mg by mouth at bedtime.    Marland Kitchen DOCUSATE SODIUM 100 MG PO CAPS Oral Take 200 mg by mouth 2 (two) times daily.      Marland Kitchen ENSURE PUDDING PO PUDG Oral Take 1 Container by mouth 2 (two) times daily between meals.    Marland Kitchen PRO-STAT 64 PO LIQD Oral Take 30 mLs by mouth 2 (two) times daily.    Marland Kitchen FERROUS SULFATE 325 (65 FE) MG PO TABS Oral Take 325 mg by mouth 2 (two) times daily with a meal.    . FLUOXETINE HCL 40 MG PO CAPS Oral Take 40 mg by mouth daily.      Marland Kitchen GABAPENTIN 300 MG PO CAPS Oral Take 600 mg by mouth 3 (three) times daily.    Carma Leaven M PLUS PO TABS Oral  Take 1 tablet by mouth daily.      Marland Kitchen NAPROXEN SODIUM 275 MG PO TABS Oral Take 275 mg by mouth 2 (two) times daily with a meal. For inflammation    . OMEPRAZOLE 20 MG PO CPDR Oral Take 20 mg by mouth 2 (two) times daily.      . OXYCODONE HCL 5 MG PO TABS Oral Take 5 mg by mouth every 4 (four) hours as needed. pain    . VITAMIN C 500 MG PO TABS Oral Take 500 mg by mouth 2 (two) times daily.      Marland Kitchen FLUCONAZOLE 100 MG PO TABS  Take 1  Tab by mouth daily till 3/20 4 tablet 0    BP 135/71  Pulse 97  Temp(Src) 100.8 F (38.2 C) (Axillary)  Resp 18  SpO2 100%  Physical Exam  Nursing note and vitals reviewed. Constitutional: She appears well-developed and well-nourished. No distress.  HENT:  Head: Normocephalic and atraumatic.       Dry mucous membranes  Eyes: Pupils are equal, round, and reactive to light.       Normal appearance  Neck: Normal range of motion.    Cardiovascular: Normal rate and regular rhythm.   Pulmonary/Chest: Effort normal and breath sounds normal.  Abdominal: Soft. Bowel sounds are normal. She exhibits no distension.       Obese.  Grimacing w/ palpation diffusely.   Genitourinary:       Pt has an indwelling foley.  Urine cloudy.   Neurological:       Pt responds with grunting to pain stimulus.  Does not follow commands.   Skin: Skin is warm and dry. No rash noted.       Multiple Stage 2 decubitus ulceration on right buttock and sacrum.   Psychiatric: She has a normal mood and affect. Her behavior is normal.    ED Course  Procedures (including critical care time)   Date: 03/26/2012  Rate: 96  Rhythm: normal sinus rhythm  QRS Axis: left  Intervals: normal  ST/T Wave abnormalities: normal  Conduction Disutrbances:none  Narrative Interpretation:   Old EKG Reviewed: unchanged   Labs Reviewed  CBC - Abnormal; Notable for the following:    Hemoglobin 11.7 (*)    MCV 75.4 (*)    MCH 24.0 (*)    RDW 18.6 (*)    All other components within normal limits  DIFFERENTIAL - Abnormal; Notable for the following:    Monocytes Relative 14 (*)    All other components within normal limits  BASIC METABOLIC PANEL - Abnormal; Notable for the following:    Calcium 11.3 (*)    GFR calc non Af Amer 87 (*)    All other components within normal limits  URINALYSIS, ROUTINE W REFLEX MICROSCOPIC - Abnormal; Notable for the following:    Color, Urine AMBER (*) BIOCHEMICALS MAY BE AFFECTED BY COLOR   APPearance TURBID (*)    Hgb urine dipstick LARGE (*)    Protein, ur 30 (*)    Leukocytes, UA LARGE (*)    All other components within normal limits  URINE MICROSCOPIC-ADD ON - Abnormal; Notable for the following:    Bacteria, UA FEW (*)    Casts HYALINE CASTS (*)    All other components within normal limits  LACTIC ACID, PLASMA  PROCALCITONIN  CULTURE, BLOOD (ROUTINE X 2)  CULTURE, BLOOD (ROUTINE X 2)  URINE CULTURE   Dg Chest Port  1 View  03/25/2012  *RADIOLOGY REPORT*  Clinical  Data: Fever.  PORTABLE CHEST - 1 VIEW  Comparison: 03/01/2012  Findings: Shallow inspiration with elevation of the left hemidiaphragm.  Heart size and pulmonary vascularity are normal for technique.  No focal airspace consolidation.  No blunting of costophrenic angles.  No pneumothorax.  No significant change since the previous study.  Increased density behind the heart may represent esophageal hiatal hernia.  IMPRESSION: Shallow inspiration with elevation of the left hemidiaphragm.  No focal consolidation.  Original Report Authenticated By: Marlon Pel, M.D.     1. Altered mental status   2. Dehydration   3. UTI (lower urinary tract infection)       MDM  70yo F sent from nursing home by her PCP for AMS.  On exam, pt febrile, mildly tachycardic, normotensive, responsive to pain stimulus only, dry mucous membranes, lungs clear, abd benign, stage 2 decubitus ulcers sacrum and right buttock.  U/A pos for infection.  No leukocytosis and lactate w/in nml range.  All results discussed w/ pt.  Consulted Dr. [name] who requests admission to teaching service.  [facility] have accepted pt to their service.  VS currently stable w/ exception of temp which improved w/ tylenol suppository.  Pt has become more alert since receiving IV fluids  IV rocephin running currently.          Otilio Miu, PA 03/26/12 0230

## 2012-03-25 NOTE — ED Notes (Signed)
EMS reports pt is responsive to painful stimuli only and has foley in place from facility.

## 2012-03-26 ENCOUNTER — Emergency Department (HOSPITAL_COMMUNITY): Payer: Medicare (Managed Care)

## 2012-03-26 ENCOUNTER — Inpatient Hospital Stay (HOSPITAL_COMMUNITY): Payer: Medicare (Managed Care)

## 2012-03-26 ENCOUNTER — Encounter (HOSPITAL_COMMUNITY): Payer: Self-pay | Admitting: Radiology

## 2012-03-26 DIAGNOSIS — G9341 Metabolic encephalopathy: Secondary | ICD-10-CM

## 2012-03-26 DIAGNOSIS — A419 Sepsis, unspecified organism: Secondary | ICD-10-CM

## 2012-03-26 DIAGNOSIS — R4182 Altered mental status, unspecified: Secondary | ICD-10-CM | POA: Diagnosis present

## 2012-03-26 LAB — COMPREHENSIVE METABOLIC PANEL
AST: 47 U/L — ABNORMAL HIGH (ref 0–37)
Albumin: 2 g/dL — ABNORMAL LOW (ref 3.5–5.2)
Alkaline Phosphatase: 352 U/L — ABNORMAL HIGH (ref 39–117)
BUN: 20 mg/dL (ref 6–23)
Chloride: 114 mEq/L — ABNORMAL HIGH (ref 96–112)
Potassium: 3.9 mEq/L (ref 3.5–5.1)
Total Bilirubin: 0.2 mg/dL — ABNORMAL LOW (ref 0.3–1.2)

## 2012-03-26 LAB — CBC
MCH: 24.1 pg — ABNORMAL LOW (ref 26.0–34.0)
MCV: 77.1 fL — ABNORMAL LOW (ref 78.0–100.0)
Platelets: 294 10*3/uL (ref 150–400)
RDW: 18.8 % — ABNORMAL HIGH (ref 11.5–15.5)

## 2012-03-26 MED ORDER — ACETAMINOPHEN 650 MG RE SUPP
650.0000 mg | Freq: Four times a day (QID) | RECTAL | Status: DC | PRN
  Administered 2012-03-28: 650 mg via RECTAL
  Filled 2012-03-26: qty 1

## 2012-03-26 MED ORDER — HEPARIN SODIUM (PORCINE) 5000 UNIT/ML IJ SOLN
5000.0000 [IU] | Freq: Three times a day (TID) | INTRAMUSCULAR | Status: DC
  Administered 2012-03-26 – 2012-03-27 (×5): 5000 [IU] via SUBCUTANEOUS
  Filled 2012-03-26 (×7): qty 1

## 2012-03-26 MED ORDER — VANCOMYCIN HCL 1000 MG IV SOLR
750.0000 mg | Freq: Two times a day (BID) | INTRAVENOUS | Status: DC
  Administered 2012-03-26 – 2012-03-28 (×6): 750 mg via INTRAVENOUS
  Filled 2012-03-26 (×7): qty 750

## 2012-03-26 MED ORDER — COLLAGENASE 250 UNIT/GM EX OINT
TOPICAL_OINTMENT | Freq: Every day | CUTANEOUS | Status: DC
  Administered 2012-03-26 – 2012-04-01 (×7): via TOPICAL
  Filled 2012-03-26 (×2): qty 30

## 2012-03-26 MED ORDER — ACETAMINOPHEN 325 MG PO TABS: 650.0000 mg | ORAL_TABLET | Freq: Four times a day (QID) | ORAL | Status: DC | PRN

## 2012-03-26 MED ORDER — SODIUM CHLORIDE 0.9 % IV SOLN
INTRAVENOUS | Status: DC
  Administered 2012-03-26 – 2012-03-27 (×3): via INTRAVENOUS
  Administered 2012-03-28: 100 mL/h via INTRAVENOUS
  Administered 2012-03-28 – 2012-03-31 (×6): via INTRAVENOUS

## 2012-03-26 MED ORDER — MORPHINE SULFATE 2 MG/ML IJ SOLN
0.5000 mg | INTRAMUSCULAR | Status: DC | PRN
  Administered 2012-03-27 – 2012-03-28 (×2): 0.5 mg via INTRAVENOUS
  Filled 2012-03-26 (×2): qty 1

## 2012-03-26 MED ORDER — IOHEXOL 300 MG/ML  SOLN
80.0000 mL | Freq: Once | INTRAMUSCULAR | Status: AC | PRN
  Administered 2012-03-26: 80 mL via INTRAVENOUS

## 2012-03-26 MED ORDER — PIPERACILLIN-TAZOBACTAM 3.375 G IVPB
3.3750 g | Freq: Three times a day (TID) | INTRAVENOUS | Status: DC
  Administered 2012-03-26 – 2012-04-01 (×20): 3.375 g via INTRAVENOUS
  Filled 2012-03-26 (×22): qty 50

## 2012-03-26 MED ORDER — IOHEXOL 300 MG/ML  SOLN
100.0000 mL | Freq: Once | INTRAMUSCULAR | Status: AC | PRN
  Administered 2012-03-26: 100 mL via INTRAVENOUS

## 2012-03-26 NOTE — Consult Note (Signed)
Reason for Consult:Persistent pelvic abscess. Referring Physician: Dr. Bosie Clos.  [name] [name] is an 71 y.o. female.  HPI: [name] [name] was readmitted to the hospital with recurrent sepsis.  She is followed in our office by Dr. Patsi Sears and was seen in consultation in March when she was admitted for sepsis.  She was found to have a spontaneous bladder rupture with an anterior pelvic abscess.  If was felt at the time that foley drainage would be sufficient for management of the bladder perforation and abscess.   She was discharged to rehab but was readmitted with recurrent sepsis.   A CT scan today showed a persistent abscess with air and fluid superior to the bladder.   I spoke with her attending and recommended a CT cystogram which demonstrated reflux of contrast into the abscess cavity that caps the dome of the bladder and extends into the suprapubic area.  There is a second abscess in the left anterior abdominal wall in the LLQ that is non-contiguous.  I have reviewed her chart and films but was unable to take a history from the patient as I wasn't able to waken her.  Past Medical History  Diagnosis Date  . Multiple sclerosis   . GERD (gastroesophageal reflux disease)   . Depression   . Recurrent UTI   . Recurrent pneumonia   . Chronic constipation   . Diverticulosis     Past Surgical History  Procedure Date  . Cholecystectomy   . Orif humeral shaft fracture 01/2011  . Video assisted thoracoscopy 06/2010  . Closed reduction shoulder dislocation 04/2010    Fracture dislocation  . Femur im nail 01/13/2012    Procedure: INTRAMEDULLARY (IM) RETROGRADE FEMORAL NAILING;  Surgeon: Thera Flake., MD;  Location: [facility] OR;  Service: Orthopedics;  Laterality: Left;  . Femoral artery exploration 01/13/2012    Procedure: FEMORAL ARTERY EXPLORATION;  Surgeon: Pryor Ochoa, MD;  Location: [facility] OR;  Service: Vascular;  Laterality: Left;    History reviewed. No pertinent family history.  Social History:   reports that she has never smoked. She has never used smokeless tobacco. She reports that she does not drink alcohol or use illicit drugs.  Allergies:  Allergies  Allergen Reactions  . Penicillins Other (See Comments)    Previously tolerated Zosyn x7 days    Medications: I have reviewed the patient's current medications.  Results for orders placed during the hospital encounter of 03/25/12 (from the past 48 hour(s))  CBC     Status: Abnormal   Collection Time   03/25/12 10:05 PM      Component Value Range Comment   WBC 6.4  4.0 - 10.5 (K/uL)    RBC 4.88  3.87 - 5.11 (MIL/uL)    Hemoglobin 11.7 (*) 12.0 - 15.0 (g/dL)    HCT 16.1  09.6 - 04.5 (%)    MCV 75.4 (*) 78.0 - 100.0 (fL)    MCH 24.0 (*) 26.0 - 34.0 (pg)    MCHC 31.8  30.0 - 36.0 (g/dL)    RDW 40.9 (*) 81.1 - 15.5 (%)    Platelets 340  150 - 400 (K/uL)   DIFFERENTIAL     Status: Abnormal   Collection Time   03/25/12 10:05 PM      Component Value Range Comment   Neutrophils Relative 44  43 - 77 (%)    Neutro Abs 2.8  1.7 - 7.7 (K/uL)    Lymphocytes Relative 40  12 - 46 (%)  Lymphs Abs 2.6  0.7 - 4.0 (K/uL)    Monocytes Relative 14 (*) 3 - 12 (%)    Monocytes Absolute 0.9  0.1 - 1.0 (K/uL)    Eosinophils Relative 2  0 - 5 (%)    Eosinophils Absolute 0.1  0.0 - 0.7 (K/uL)    Basophils Relative 0  0 - 1 (%)    Basophils Absolute 0.0  0.0 - 0.1 (K/uL)   BASIC METABOLIC PANEL     Status: Abnormal   Collection Time   03/25/12 10:05 PM      Component Value Range Comment   Sodium 143  135 - 145 (mEq/L)    Potassium 4.5  3.5 - 5.1 (mEq/L)    Chloride 111  96 - 112 (mEq/L)    CO2 23  19 - 32 (mEq/L)    Glucose, Bld 89  70 - 99 (mg/dL)    BUN 23  6 - 23 (mg/dL)    Creatinine, Ser 1.61  0.50 - 1.10 (mg/dL)    Calcium 09.6 (*) 8.4 - 10.5 (mg/dL)    GFR calc non Af Amer 87 (*) >90 (mL/min)    GFR calc Af Amer >90  >90 (mL/min)   LACTIC ACID, PLASMA     Status: Normal   Collection Time   03/25/12 10:05 PM      Component Value  Range Comment   Lactic Acid, Venous 1.0  0.5 - 2.2 (mmol/L)   PROCALCITONIN     Status: Normal   Collection Time   03/25/12 10:05 PM      Component Value Range Comment   Procalcitonin 0.42     URINALYSIS, ROUTINE W REFLEX MICROSCOPIC     Status: Abnormal   Collection Time   03/25/12 10:17 PM      Component Value Range Comment   Color, Urine AMBER (*) YELLOW  BIOCHEMICALS MAY BE AFFECTED BY COLOR   APPearance TURBID (*) CLEAR     Specific Gravity, Urine 1.020  1.005 - 1.030     pH 5.5  5.0 - 8.0     Glucose, UA NEGATIVE  NEGATIVE (mg/dL)    Hgb urine dipstick LARGE (*) NEGATIVE     Bilirubin Urine NEGATIVE  NEGATIVE     Ketones, ur NEGATIVE  NEGATIVE (mg/dL)    Protein, ur 30 (*) NEGATIVE (mg/dL)    Urobilinogen, UA 0.2  0.0 - 1.0 (mg/dL)    Nitrite NEGATIVE  NEGATIVE     Leukocytes, UA LARGE (*) NEGATIVE    URINE MICROSCOPIC-ADD ON     Status: Abnormal   Collection Time   03/25/12 10:17 PM      Component Value Range Comment   WBC, UA TOO NUMEROUS TO COUNT  <3 (WBC/hpf)    RBC / HPF TOO NUMEROUS TO COUNT  <3 (RBC/hpf)    Bacteria, UA FEW (*) RARE     Casts HYALINE CASTS (*) NEGATIVE     Urine-Other MUCOUS PRESENT     COMPREHENSIVE METABOLIC PANEL     Status: Abnormal   Collection Time   03/26/12  6:50 AM      Component Value Range Comment   Sodium 142  135 - 145 (mEq/L)    Potassium 3.9  3.5 - 5.1 (mEq/L)    Chloride 114 (*) 96 - 112 (mEq/L)    CO2 20  19 - 32 (mEq/L)    Glucose, Bld 97  70 - 99 (mg/dL)    BUN 20  6 - 23 (mg/dL)  Creatinine, Ser 0.57  0.50 - 1.10 (mg/dL)    Calcium 11.9  8.4 - 10.5 (mg/dL)    Total Protein 5.2 (*) 6.0 - 8.3 (g/dL)    Albumin 2.0 (*) 3.5 - 5.2 (g/dL)    AST 47 (*) 0 - 37 (U/L)    ALT 26  0 - 35 (U/L)    Alkaline Phosphatase 352 (*) 39 - 117 (U/L)    Total Bilirubin 0.2 (*) 0.3 - 1.2 (mg/dL)    GFR calc non Af Amer >90  >90 (mL/min)    GFR calc Af Amer >90  >90 (mL/min)   CBC     Status: Abnormal   Collection Time   03/26/12  6:50 AM       Component Value Range Comment   WBC 4.3  4.0 - 10.5 (K/uL)    RBC 4.28  3.87 - 5.11 (MIL/uL)    Hemoglobin 10.3 (*) 12.0 - 15.0 (g/dL)    HCT 14.7 (*) 82.9 - 46.0 (%)    MCV 77.1 (*) 78.0 - 100.0 (fL)    MCH 24.1 (*) 26.0 - 34.0 (pg)    MCHC 31.2  30.0 - 36.0 (g/dL)    RDW 56.2 (*) 13.0 - 15.5 (%)    Platelets 294  150 - 400 (K/uL)     Ct Pelvis Wo Contrast  03/26/2012  *RADIOLOGY REPORT*  Clinical Data:  History of bladder perforation and enlarged.  The patient returns with pain and sepsis.  CT ABDOMEN AND PELVIS WITH CONTRAST, CT pelvis without contrast  Technique:  Multidetector CT imaging of the abdomen and pelvis was performed using the standard protocol following bolus administration of intravenous contrast.  Contrast:  100 ml Omnipaque 300  Comparison:  CT pelvis 03/06/2012.  CT abdomen and pelvis 03/05/2012.  Findings:  Unenhanced pelvis demonstrates residual contrast material in the colon.  Foley catheter in the bladder.  Rounded increased density in the left pelvis likely representing a calcified uterine fibroid.  Contrast enhanced CT abdomen and pelvis was subsequently obtained. There is atelectasis or infiltration in both lung bases with mild nodular pleural thickening.  Diffuse low attenuation change throughout the liver suggesting fatty infiltration.  Mild splenic enlargement.  The gallbladder is surgically absent.  The pancreas is atrophic.  No adrenal gland nodules.  The stomach and small bowel are decompressed.  Stool filled colon without distension. Kidneys are symmetrical with small sub centimeter parenchymal cysts.  No solid mass or hydronephrosis.  No abdominal aortic aneurysm.  No retroperitoneal lymphadenopathy.  No free fluid or free air in the abdomen.  Pelvis with contrast:  A Foley catheter decompresses the bladder. Delayed images demonstrate contrast material in the bladder with evidence of diffuse bladder wall thickening although the bladder wall is not well demonstrated due  to decompression.  Loculated fluid collection in the left pelvis measuring 2 x 5.9 cm.  This is smaller than on the previous study and suggest residual abscess. No new or enlarging fluid collections. There is a gas collection in the anterior pelvis and left pelvis, localized to the area of gas and fluid on the previous study. This may represent free air or loculated collection in the area of abscess.  The collection is smaller than on the previous study, but there is less fluid today and more air.  This may represent infection with gas forming organism, bladder perforation with air coming from a Foley catheter, or fistula to the colon.  There is interval development of narrowing and wall thickening  of the distal sigmoid colon which might represent inflammatory process.  Degenerative changes in the spine.  IMPRESSION: Thickened bladder wall with Foley catheter in place.  Persistent fluid collection in the left pelvis consistent with abscess, smaller than previous study.  Persistent gas collection in the anterior pelvis, similar in size to previous study.  This likely represents a abscess.  Communication to bowel is not excluded. Focal thickening of the wall of the sigmoid colon which may represent inflammatory process.  Original Report Authenticated By: Marlon Pel, M.D.   Ct Pelvis W Contrast  03/26/2012  *RADIOLOGY REPORT*  Clinical Data:  Ruptured bladder with pelvic abscesses  CT CYSTOGRAM (CT PELVIS WITHOUT CONTRAST)  Technique:  Multidetector CT imaging through the pelvis was performed after dilute contrast had been introduced into the bladder for the purposes of performing CT cystography.  Comparison:  CT abdomen pelvis dated 03/26/2012 at 01:34 hours  Findings:  Thick-walled bladder with indwelling Foley catheter and a small amount of nondependent gas.  Injected contrast opacifies the bladder lumen.  Extravasation of contrast with associated 3.0 x 12.6 x 3.3 cm fluid collection anterior to the  bladder dome (series 3/image 32), likely extending from the left superior aspect of the bladder dome (series 3/image 33).  Layering contrast extends to a contiguous 2.6 x 4.7 x 3.9 cm gas and fluid collection immediately beneath the left lower abdominal wall (series 3/image 25).  Additional 3.1 x 6.3 x 2.5 cm fluid collection/abscess superior to the bladder does not opacify with contrast (series 3/image 27).  Adjacent uterus is displaced to the left and notable for calcified uterine fibroids.  Ovary is unremarkable.  No right adnexal mass.  Contrast within distal colon/rectum is related to recent prior CT. Colonic diverticulosis.  No pelvic ascites.  Surgical hardware in the left proximal femur, incompletely visualized.  Degenerative changes of the visualized lumbar spine.  IMPRESSION: Persistent bladder perforation with extraluminal contrast within two contiguous fluid collections in the anterior pelvis, as described above.  Additional fluid collection/abscess superior to the bladder does not opacify with contrast.  Original Report Authenticated By: Charline Bills, M.D.   Ct Abdomen Pelvis W Contrast  03/26/2012  *RADIOLOGY REPORT*  Clinical Data:  History of bladder perforation and enlarged.  The patient returns with pain and sepsis.  CT ABDOMEN AND PELVIS WITH CONTRAST, CT pelvis without contrast  Technique:  Multidetector CT imaging of the abdomen and pelvis was performed using the standard protocol following bolus administration of intravenous contrast.  Contrast:  100 ml Omnipaque 300  Comparison:  CT pelvis 03/06/2012.  CT abdomen and pelvis 03/05/2012.  Findings:  Unenhanced pelvis demonstrates residual contrast material in the colon.  Foley catheter in the bladder.  Rounded increased density in the left pelvis likely representing a calcified uterine fibroid.  Contrast enhanced CT abdomen and pelvis was subsequently obtained. There is atelectasis or infiltration in both lung bases with mild nodular  pleural thickening.  Diffuse low attenuation change throughout the liver suggesting fatty infiltration.  Mild splenic enlargement.  The gallbladder is surgically absent.  The pancreas is atrophic.  No adrenal gland nodules.  The stomach and small bowel are decompressed.  Stool filled colon without distension. Kidneys are symmetrical with small sub centimeter parenchymal cysts.  No solid mass or hydronephrosis.  No abdominal aortic aneurysm.  No retroperitoneal lymphadenopathy.  No free fluid or free air in the abdomen.  Pelvis with contrast:  A Foley catheter decompresses the bladder. Delayed images demonstrate contrast material in  the bladder with evidence of diffuse bladder wall thickening although the bladder wall is not well demonstrated due to decompression.  Loculated fluid collection in the left pelvis measuring 2 x 5.9 cm.  This is smaller than on the previous study and suggest residual abscess. No new or enlarging fluid collections. There is a gas collection in the anterior pelvis and left pelvis, localized to the area of gas and fluid on the previous study. This may represent free air or loculated collection in the area of abscess.  The collection is smaller than on the previous study, but there is less fluid today and more air.  This may represent infection with gas forming organism, bladder perforation with air coming from a Foley catheter, or fistula to the colon.  There is interval development of narrowing and wall thickening of the distal sigmoid colon which might represent inflammatory process.  Degenerative changes in the spine.  IMPRESSION: Thickened bladder wall with Foley catheter in place.  Persistent fluid collection in the left pelvis consistent with abscess, smaller than previous study.  Persistent gas collection in the anterior pelvis, similar in size to previous study.  This likely represents a abscess.  Communication to bowel is not excluded. Focal thickening of the wall of the sigmoid  colon which may represent inflammatory process.  Original Report Authenticated By: Marlon Pel, M.D.   Dg Chest Port 1 View  03/25/2012  *RADIOLOGY REPORT*  Clinical Data: Fever.  PORTABLE CHEST - 1 VIEW  Comparison: 03/01/2012  Findings: Shallow inspiration with elevation of the left hemidiaphragm.  Heart size and pulmonary vascularity are normal for technique.  No focal airspace consolidation.  No blunting of costophrenic angles.  No pneumothorax.  No significant change since the previous study.  Increased density behind the heart may represent esophageal hiatal hernia.  IMPRESSION: Shallow inspiration with elevation of the left hemidiaphragm.  No focal consolidation.  Original Report Authenticated By: Marlon Pel, M.D.    Review of Systems  Unable to perform ROS: patient unresponsive   Blood pressure 143/77, pulse 93, temperature 98.2 F (36.8 C), temperature source Oral, resp. rate 17, height 5\' 8"  (1.727 m), weight 74.5 kg (164 lb 3.9 oz), SpO2 100.00%. Physical Exam  The patient was unarousable but appears WD and WN. I have reviewed the remainder of the physical exam in the chart.  Assessment/Plan: She has a persistent bladder perforation with a contiguous anterior pelvic abscess with sepsis syndrome.   There is a second non-contiguous abscess in the LLQ anterior abdominal wall.  At this point foley drainage alone is not getting the job done.   She need percutaneous drain placement into the prevesical abscess and a second drain into the LLQ anterior abdominal wall abscess.  If this is not effective, she might need a more formal surgical exploration for drainage of the abscesses.     The bladder perforation may eventually be best managed by conversion to suprapubic bladder drainage through the perforation tract.  Jamerica Snavely J 03/26/2012, 7:36 PM

## 2012-03-26 NOTE — H&P (Signed)
Hospital Admission Note Date: 03/26/2012  Patient name: [name] [name] Medical record number: [id number] Date of birth: 06-Jul-1941 Age: 71 y.o. Gender: female PCP: Thane Edu, MD, MD  Medical Service:          Internal Medicine Teaching Service    Attending physician:  Dr.  Margarito Liner   1st Contact:  Dr. Margorie John    Pager: [phone]  2nd Contact:  Dr. Rosana Berger Pager: [phone] After 5 pm or weekends: 1st Contact:      Pager: [phone] 2nd Contact:      Pager: [phone]   Chief Complaint  Patient presents with  . Urinary Tract Infection  . Altered Mental Status     History of Present Illness: [name] [name] is a 71 y.o.female with past medical history significant for multiple medical problems including multiple sclerosis, with multiple hospitalizations for pneumonia and urinary tract infections secondary to indwelling catheter, who presents with sepsis and metabolic encephalopathy and UTI.  Tonight the patient is altered in the ED and unable to respond to questions. She is unable to tell me if she is in pain. She occasionally will open her eyes in response to stimulation. The subsequent history was obtained via chart review.   The patient has had multiple admissions for urinary tract infection. Recently,the patient had been admitted from March 15 through the 19th for bladder rupture. Patient was seen in the hospital by urology who recommended placement of a larger bore Foley catheter and outpatient followup. She was also found to have UTI and was bacteremic one blood culture positive for coagulase-negative staph. She also had altered mental status/metabolic encephalopathy secondary to sepsis which resolved with antibiotic therapy. She has not had surgical correction of her perforation at this time.  On March 27 the patient was seen in the [facility] ED where she was found to have mild AMS, febrile and hypotensive and to have a urinary tract infection. Her urinary tract  infection was cultured and grew PROVIDENCIA STUARTII. This culture was resistant to ampicillin, supple swollen, ciprofloxacin, levofloxacin, nitrofurantoin, Bactrim, gentamicin, and tobramycin. It was sensitive to ceftriaxone, imipenem and Zosyn. She received IV fluids and IV Rocephin in discussion with her primary care physician Dr. [name].  She was able to speak and answer questions and she was then discharged back to [facility] where she received IM ceftriaxone and oral Cipro for five days.  The patient was found today to have worsening mental status and continued fevers with a MAXIMUM TEMPERATURE of 101.3   Review of Systems: Unable to perform as the patient was unresponsive.  Past Medical History  Diagnosis Date  . Multiple sclerosis   . GERD (gastroesophageal reflux disease)   . Depression   . Recurrent UTI   . Recurrent pneumonia   . Chronic constipation   . Diverticulosis    iron deficiency anemia Ruptured bladder Hypercalcemia  Past Surgical History  Procedure Date  . Cholecystectomy   . Orif humeral shaft fracture 01/2011  . Video assisted thoracoscopy 06/2010  . Closed reduction shoulder dislocation 04/2010    Fracture dislocation  . Femur im nail 01/13/2012    Procedure: INTRAMEDULLARY (IM) RETROGRADE FEMORAL NAILING;  Surgeon: Thera Flake., MD;  Location: [facility] OR;  Service: Orthopedics;  Laterality: Left;  . Femoral artery exploration 01/13/2012    Procedure: FEMORAL ARTERY EXPLORATION;  Surgeon: Pryor Ochoa, MD;  Location: [facility] OR;  Service: Vascular;  Laterality: Left;    Meds: Medications Prior to Admission  Medication Dose Route  Frequency Provider Last Rate Last Dose  . 0.9 %  sodium chloride infusion   Intravenous Once Otilio Miu, PA 250 mL/hr at 03/25/12 2320    . acetaminophen (TYLENOL) suppository 650 mg  650 mg Rectal Once Otilio Miu, PA   650 mg at 03/25/12 2226  . cefTRIAXone (ROCEPHIN) 1 g in dextrose 5 % 50 mL IVPB  1 g Intravenous  Once Otilio Miu, PA   1 g at 03/25/12 2319  . sodium chloride 0.9 % bolus 1,000 mL  1,000 mL Intravenous Once Otilio Miu, PA   1,000 mL at 03/25/12 2202   Medications Prior to Admission  Medication Sig Dispense Refill  . amitriptyline (ELAVIL) 100 MG tablet Take 100 mg by mouth at bedtime.      . docusate sodium (COLACE) 100 MG capsule Take 200 mg by mouth 2 (two) times daily.        . feeding supplement (ENSURE) PUDG Take 1 Container by mouth 2 (two) times daily between meals.      . feeding supplement (PRO-STAT SUGAR FREE 64) LIQD Take 30 mLs by mouth 2 (two) times daily.      . ferrous sulfate 325 (65 FE) MG tablet Take 325 mg by mouth 2 (two) times daily with a meal.      . FLUoxetine (PROZAC) 40 MG capsule Take 40 mg by mouth daily.        Marland Kitchen gabapentin (NEURONTIN) 300 MG capsule Take 600 mg by mouth 3 (three) times daily.      . Multiple Vitamins-Minerals (MULTIVITAMINS THER. W/MINERALS) TABS Take 1 tablet by mouth daily.        . naproxen sodium (ANAPROX) 275 MG tablet Take 275 mg by mouth 2 (two) times daily with a meal. For inflammation      . omeprazole (PRILOSEC) 20 MG capsule Take 20 mg by mouth 2 (two) times daily.        Marland Kitchen oxyCODONE (OXY IR/ROXICODONE) 5 MG immediate release tablet Take 5 mg by mouth every 4 (four) hours as needed. pain      . vitamin C (ASCORBIC ACID) 500 MG tablet Take 500 mg by mouth 2 (two) times daily.        . fluconazole (DIFLUCAN) 100 MG tablet Take 1  Tab by mouth daily till 3/20  4 tablet  0    Allergies: Penicillins (previously tolerated Zosyn)  History reviewed. No pertinent family history.  History   Social History  . Marital Status: Unknown    Spouse Name: N/A    Number of Children: 2  . Years of Education: N/A   Occupational History  . seamstress     [employer]   Social History Main Topics  . Smoking status: Never Smoker   . Smokeless tobacco: Never Used  . Alcohol Use: No  . Drug Use: No  . Sexually  Active: No   Other Topics Concern  . Not on file   Social History Narrative   Lives in [place] by herself. Has two children who live in [place].     Physical Exam: Blood pressure 129/79, pulse 103, temperature 99.3 F (37.4 C), temperature source Rectal, resp. rate 19, SpO2 100.00%. Gen: Elderly female able to open her eyes to stimulation but cannot verbalize responses Head: Normocephalic, atraumatic. Eyes: PERRL,No signs of anemia or jaundince. Nose: Mucous membranes dry, not inflammed, nonerythematous. Throat: Oropharynx nonerythematous, oropharynx is very dry. The oral mucosa is macerated and friable with no exudates visible.  Neck: Supple with no lymphadenopathy noted. She does have anterior cervical fullness on palpation. No JVD. Lungs: Tachypnea with very deep inspiration. Clear to auscultation BL anteriorly, without crackles or wheezes. Heart: Tachycardic without  Murmur or gallop. Abdomen: BS hypoactive. Soft, without guarding, nondistended, not appreciably tender. No masses or organomegaly.adult diaper and a Foley catheter in place. Foley catheter draining reddish brown urine  Extremities: Intact radial and dorsalis pedis pulses. Forearm and foreleg skin is wrinkled with tenting. Trace pedal edema in the right foot.  Neurologic: No observed focal neurologic deficits Skin: Scattered ecchymoses on the bilateral forearms and bilateral feet. Pressure ulcers present on the bilateral ankles and sacrum. Psych: Unable to assess  Lab results: Basic Metabolic Panel:  [facility] 03/25/12 2205  NA 143  K 4.5  CL 111  CO2 23  GLUCOSE 89  BUN 23  CREATININE 0.68  CALCIUM 11.3*  MG --  PHOS --   CBC:  Basename 03/25/12 2205  WBC 6.4  NEUTROABS 2.8  HGB 11.7*  HCT 36.8  MCV 75.4*  PLT 340   Urinalysis:  Basename 03/25/12 2217  COLORURINE AMBER*  LABSPEC 1.020  PHURINE 5.5  GLUCOSEU NEGATIVE  HGBUR LARGE*  BILIRUBINUR NEGATIVE  KETONESUR NEGATIVE  PROTEINUR  30*  UROBILINOGEN 0.2  NITRITE NEGATIVE  LEUKOCYTESUR LARGE*   urine hemoglobin dipstick -- large Too numerous to count white blood cells and red blood cells Few bacteria Hylan casts   Imaging results:  Dg Chest Port 1 View  03/25/2012  *RADIOLOGY REPORT*  Clinical Data: Fever.  PORTABLE CHEST - 1 VIEW  Comparison: 03/01/2012  Findings: Shallow inspiration with elevation of the left hemidiaphragm.  Heart size and pulmonary vascularity are normal for technique.  No focal airspace consolidation.  No blunting of costophrenic angles.  No pneumothorax.  No significant change since the previous study.  Increased density behind the heart may represent esophageal hiatal hernia.  IMPRESSION: Shallow inspiration with elevation of the left hemidiaphragm.  No focal consolidation.  Original Report Authenticated By: Marlon Pel, M.D.    Other results: ECG REPORT   Date: 03/26/2012  EKG Time: 1:08 AM  Rate: 99  Rhythm: normal sinus rhythm,  unchanged from previous tracings, possible Q waves in lead 3  Axis: Left axis deviation  Intervals:none  ST&T Change: Nonspecific T wave flattening in aVL V1 V2 new from prior tracing  Narrative Interpretation: Borderline tachycardia with no evidence of acute ischemia.     Assessment & Plan by Problem:  1) severe sepsis: The patient is hypotensive, tachycardic, tachypneic, febrile with likely urinary source of infection given her dirty urinalysis and prior history of abscess secondary to bladder perforation. She also has metabolic encephalopathy as evidence of endorgan dysfunction resulting from her sepsis. She had already received full 5 days of ceftriaxone, and therefore is considered to have failed outpatient ceftriaxone therapy. She will require broad spectrum antibiotics and volume resuscitation in addition to hospital admission. Will elect to treat with both gram-positive and gram-negative coverage given prior urinary tract infections included  coagulase-negative staph and gram-negative bacteria. The patient is also very high risk for nosocomial infection -- Admit to step down -- Follow blood cultures already drawn in ED -- Follow urine cultures already drawn in ED --  CT abdomen and pelvis with and without contrast to evaluate for abscess and bladder perforation -- 200 mL per hour of normal saline -- Vancomycin and Zosyn -- Recommend urology consult pending CT scan results  2) metabolic encephalopathy: This is  likely secondary to her current infection. Will treat with volume resuscitation and antibiotics and expect resolution of mental status changes  3) hypercalcemia: Patient has elevated calcium again during this admission to 11.3. Baseline calcium is 9 -10.8. We will fluid resuscitate. We'll monitor closely, if persistent and uncorrected with fluids we will consider PTH and vitamin D levels.  4) Multiple sclerosis: Patient is n.p.o. will hold home medications. Plan to restart medication for multiple sclerosis the patient's altered mental status is resolved enough so that she can take by mouth medications.  5) Pressure ulcer of the hip, sacrum and bilateral calcanei: These were present on admission. Wound care consult ordered  6) Normocytic anemia - likely secondary to prior esophageal erosion and anemia of chronic disease. Hemoglobin of 11.7 is trending up from nadir in mid-March. Overall anemia is stable at this time we'll continue to monitor  7) DVT prophylaxis: Heparin    Signed: Sandia Pfund 03/26/2012, 12:24 AM

## 2012-03-26 NOTE — Progress Notes (Signed)
INITIAL ADULT NUTRITION ASSESSMENT Date: 03/26/2012   Time: 12:49 PM  Reason for Assessment: Nutrition Risk report--problems chewing or swallowing foods and/or liquids  ASSESSMENT: Female 71 y.o.  Dx: Septic shock  Hx:  Past Medical History  Diagnosis Date  . Multiple sclerosis   . GERD (gastroesophageal reflux disease)   . Depression   . Recurrent UTI   . Recurrent pneumonia   . Chronic constipation   . Diverticulosis     Related Meds:  Scheduled Meds:   . sodium chloride   Intravenous Once  . acetaminophen  650 mg Rectal Once  . cefTRIAXone (ROCEPHIN)  IV  1 g Intravenous Once  . collagenase   Topical Daily  . heparin  5,000 Units Subcutaneous Q8H  . piperacillin-tazobactam (ZOSYN)  IV  3.375 g Intravenous Q8H  . sodium chloride  1,000 mL Intravenous Once  . vancomycin  750 mg Intravenous Q12H   Continuous Infusions:   . sodium chloride 100 mL/hr at 03/26/12 1101   PRN Meds:.acetaminophen, acetaminophen, iohexol, morphine   Ht: 5\' 8"  (172.7 cm)  Wt: 164 lb 3.9 oz (74.5 kg)  Ideal Wt: 63.6 kg % Ideal Wt: 117%  Wt Readings from Last 15 Encounters:  03/26/12 164 lb 3.9 oz (74.5 kg)  02/24/12 169 lb 15.6 oz (77.1 kg)  02/12/12 187 lb 9.8 oz (85.1 kg)  02/12/12 187 lb 9.8 oz (85.1 kg)  01/16/12 205 lb 11 oz (93.3 kg)  01/16/12 205 lb 11 oz (93.3 kg)  01/16/12 205 lb 11 oz (93.3 kg)  11/04/09 194 lb (87.998 kg)  09/19/09 161 lb (73.029 kg)  09/15/09 161 lb 8 oz (73.256 kg)  08/11/09 172 lb (78.019 kg)  05/25/09 180 lb (81.647 kg)  01/27/09 181 lb (82.101 kg)  09/15/08 184 lb (83.462 kg)   Usual Wt: 205 lb (~2.5 months ago) % Usual Wt: 80%  Body mass index is 24.97 kg/(m^2).  Food/Nutrition Related Hx: Per long time friend, patient was eating very poorly PTA.  Has been in and out of the hospital and [facility] since January when she broke her hip.  Drank Ensure at [facility].  Labs:  CMP     Component Value Date/Time   NA 142 03/26/2012 0650   K 3.9 03/26/2012 0650   CL 114* 03/26/2012 0650   CO2 20 03/26/2012 0650   GLUCOSE 97 03/26/2012 0650   BUN 20 03/26/2012 0650   CREATININE 0.57 03/26/2012 0650   CALCIUM 10.2 03/26/2012 0650   PROT 5.2* 03/26/2012 0650   ALBUMIN 2.0* 03/26/2012 0650   AST 47* 03/26/2012 0650   ALT 26 03/26/2012 0650   ALKPHOS 352* 03/26/2012 0650   BILITOT 0.2* 03/26/2012 0650   GFRNONAA >90 03/26/2012 0650   GFRAA >90 03/26/2012 0650    Intake/Output Summary (Last 24 hours) at 03/26/12 1254 Last data filed at 03/26/12 1101  Gross per 24 hour  Intake   1150 ml  Output   1100 ml  Net     50 ml    Diet Order: NPO  IVF:    sodium chloride Last Rate: 100 mL/hr at 03/26/12 1101    Estimated Nutritional Needs:   Kcal: 1600-1800 Protein: 90-105 grams Fluid: 1.6-1.8 liters  Patient with multiple pressure ulcers.  Needs increased protein intake to support healing.  Remains NPO due to decreased level of alertness.  Unsure of chewing/swallowing issues PTA.  Intake was very poor PTA per friend, minimal intake for the past few months.  Patient with severe  malnutrition in the context of acute illness/injury given 20% weight loss in <3 months and intake less than 50% of estimated energy requirement for >/= 5 days with recent decline in nutrition status since hip fracture in January.  NUTRITION DIAGNOSIS: -Inadequate oral intake (NI-2.1).  Status: Ongoing  RELATED TO: decreased level of alertness  AS EVIDENCED BY: NPO status  MONITORING/EVALUATION(Goals): Goal:  Intake to meet >90% of estimated nutrition needs to promote healing and repletion of nutrition stores. Monitor for adequacy of oral intake when diet is advanced.  EDUCATION NEEDS: -Education not appropriate at this time  INTERVENTION: Consider swallowing evaluation with SLP when patient more alert to determine safest diet consistency. Will likely need supplements to help meet nutrition needs.  Dietitian #:  [phone]  DOCUMENTATION CODES Per approved criteria   -Severe malnutrition in the context of acute illness or injury    Hettie Holstein 03/26/2012, 12:49 PM

## 2012-03-26 NOTE — Progress Notes (Signed)
UR Completed.  [name] [name] Jane [phone] 03/26/2012  

## 2012-03-26 NOTE — H&P (Signed)
Internal Medicine Attending Admission Note Date: 03/26/2012  Patient name: [name] [name] Medical record number: [id number] Date of birth: December 17, 1941 Age: 71 y.o. Gender: female  I saw and evaluated the patient. I reviewed the resident's note and I agree with the resident's findings and plan as documented in the resident's note, with additional comments as noted below.  Chief Complaint(s): Altered mental status  History - key components related to admission: Patient is a 71 year old woman with a history of MS, anemia, diverticulosis, depression, recurrent pneumonia, and recurrent UTI, with history of bladder rupture noted on CT scan during a hospitalization in March with no intervention planned by urology other than Foley catheter, recently being treated with ceftriaxone and ciprofloxacin at nursing facility, admitted with altered mental status and fever.   Physical Exam - key components related to admission:  Filed Vitals:   03/26/12 0245 03/26/12 0300 03/26/12 0330 03/26/12 0800  BP: 129/67 135/71 142/78   Pulse: 104 103 102   Temp:   97.4 F (36.3 C) 99.3 F (37.4 C)  TempSrc:   Oral Oral  Resp: 16 17 17    Height:   5\' 8"  (1.727 m)   Weight:   164 lb 3.9 oz (74.5 kg)   SpO2: 96% 96% 97%     General: Alert, disoriented, mumbles in response to questions Lungs: Clear anteriorly Heart: Regular, no extra sounds or murmurs Abdomen: Bowel sounds present, soft, no guarding or apparent tenderness Extremities: Trace edema  Lab results:   Basic Metabolic Panel:  Basename 03/26/12 0650 03/25/12 2205  NA 142 143  K 3.9 4.5  CL 114* 111  CO2 20 23  GLUCOSE 97 89  BUN 20 23  CREATININE 0.57 0.68  CALCIUM 10.2 11.3*  MG -- --  PHOS -- --   Liver Function Tests:  [facility] 03/26/12 0650  AST 47*  ALT 26  ALKPHOS 352*  BILITOT 0.2*  PROT 5.2*  ALBUMIN 2.0*     CBC:  Basename 03/26/12 0650 03/25/12 2205  WBC 4.3 6.4  NEUTROABS -- 2.8  HGB 10.3* 11.7*  HCT 33.0* 36.8   MCV 77.1* 75.4*  PLT 294 340    Urinalysis: Specific gravity 1.020, pH 5.5, large hemoglobin, protein 30, negative nitrite, large leukocytes, WBC too numerous to count, RBC too numerous to count, few bacteria.   Imaging results:  Ct Pelvis Wo Contrast  03/26/2012  *RADIOLOGY REPORT*  Clinical Data:  History of bladder perforation and enlarged.  The patient returns with pain and sepsis.  CT ABDOMEN AND PELVIS WITH CONTRAST, CT pelvis without contrast  Technique:  Multidetector CT imaging of the abdomen and pelvis was performed using the standard protocol following bolus administration of intravenous contrast.  Contrast:  100 ml Omnipaque 300  Comparison:  CT pelvis 03/06/2012.  CT abdomen and pelvis 03/05/2012.  Findings:  Unenhanced pelvis demonstrates residual contrast material in the colon.  Foley catheter in the bladder.  Rounded increased density in the left pelvis likely representing a calcified uterine fibroid.  Contrast enhanced CT abdomen and pelvis was subsequently obtained. There is atelectasis or infiltration in both lung bases with mild nodular pleural thickening.  Diffuse low attenuation change throughout the liver suggesting fatty infiltration.  Mild splenic enlargement.  The gallbladder is surgically absent.  The pancreas is atrophic.  No adrenal gland nodules.  The stomach and small bowel are decompressed.  Stool filled colon without distension. Kidneys are symmetrical with small sub centimeter parenchymal cysts.  No solid mass or hydronephrosis.  No abdominal  aortic aneurysm.  No retroperitoneal lymphadenopathy.  No free fluid or free air in the abdomen.  Pelvis with contrast:  A Foley catheter decompresses the bladder. Delayed images demonstrate contrast material in the bladder with evidence of diffuse bladder wall thickening although the bladder wall is not well demonstrated due to decompression.  Loculated fluid collection in the left pelvis measuring 2 x 5.9 cm.  This is smaller than  on the previous study and suggest residual abscess. No new or enlarging fluid collections. There is a gas collection in the anterior pelvis and left pelvis, localized to the area of gas and fluid on the previous study. This may represent free air or loculated collection in the area of abscess.  The collection is smaller than on the previous study, but there is less fluid today and more air.  This may represent infection with gas forming organism, bladder perforation with air coming from a Foley catheter, or fistula to the colon.  There is interval development of narrowing and wall thickening of the distal sigmoid colon which might represent inflammatory process.  Degenerative changes in the spine.  IMPRESSION: Thickened bladder wall with Foley catheter in place.  Persistent fluid collection in the left pelvis consistent with abscess, smaller than previous study.  Persistent gas collection in the anterior pelvis, similar in size to previous study.  This likely represents a abscess.  Communication to bowel is not excluded. Focal thickening of the wall of the sigmoid colon which may represent inflammatory process.  Original Report Authenticated By: Marlon Pel, M.D.   Ct Abdomen Pelvis W Contrast  03/26/2012  *RADIOLOGY REPORT*  Clinical Data:  History of bladder perforation and enlarged.  The patient returns with pain and sepsis.  CT ABDOMEN AND PELVIS WITH CONTRAST, CT pelvis without contrast  Technique:  Multidetector CT imaging of the abdomen and pelvis was performed using the standard protocol following bolus administration of intravenous contrast.  Contrast:  100 ml Omnipaque 300  Comparison:  CT pelvis 03/06/2012.  CT abdomen and pelvis 03/05/2012.  Findings:  Unenhanced pelvis demonstrates residual contrast material in the colon.  Foley catheter in the bladder.  Rounded increased density in the left pelvis likely representing a calcified uterine fibroid.  Contrast enhanced CT abdomen and pelvis was  subsequently obtained. There is atelectasis or infiltration in both lung bases with mild nodular pleural thickening.  Diffuse low attenuation change throughout the liver suggesting fatty infiltration.  Mild splenic enlargement.  The gallbladder is surgically absent.  The pancreas is atrophic.  No adrenal gland nodules.  The stomach and small bowel are decompressed.  Stool filled colon without distension. Kidneys are symmetrical with small sub centimeter parenchymal cysts.  No solid mass or hydronephrosis.  No abdominal aortic aneurysm.  No retroperitoneal lymphadenopathy.  No free fluid or free air in the abdomen.  Pelvis with contrast:  A Foley catheter decompresses the bladder. Delayed images demonstrate contrast material in the bladder with evidence of diffuse bladder wall thickening although the bladder wall is not well demonstrated due to decompression.  Loculated fluid collection in the left pelvis measuring 2 x 5.9 cm.  This is smaller than on the previous study and suggest residual abscess. No new or enlarging fluid collections. There is a gas collection in the anterior pelvis and left pelvis, localized to the area of gas and fluid on the previous study. This may represent free air or loculated collection in the area of abscess.  The collection is smaller than on the previous  study, but there is less fluid today and more air.  This may represent infection with gas forming organism, bladder perforation with air coming from a Foley catheter, or fistula to the colon.  There is interval development of narrowing and wall thickening of the distal sigmoid colon which might represent inflammatory process.  Degenerative changes in the spine.  IMPRESSION: Thickened bladder wall with Foley catheter in place.  Persistent fluid collection in the left pelvis consistent with abscess, smaller than previous study.  Persistent gas collection in the anterior pelvis, similar in size to previous study.  This likely represents a  abscess.  Communication to bowel is not excluded. Focal thickening of the wall of the sigmoid colon which may represent inflammatory process.  Original Report Authenticated By: Marlon Pel, M.D.   Dg Chest Port 1 View  03/25/2012  *RADIOLOGY REPORT*  Clinical Data: Fever.  PORTABLE CHEST - 1 VIEW  Comparison: 03/01/2012  Findings: Shallow inspiration with elevation of the left hemidiaphragm.  Heart size and pulmonary vascularity are normal for technique.  No focal airspace consolidation.  No blunting of costophrenic angles.  No pneumothorax.  No significant change since the previous study.  Increased density behind the heart may represent esophageal hiatal hernia.  IMPRESSION: Shallow inspiration with elevation of the left hemidiaphragm.  No focal consolidation.  Original Report Authenticated By: Marlon Pel, M.D.    Assessment & Plan by Problem:  1.  Sepsis.  Likely urinary source, with possible intra-abdominal abscess related to ruptured bladder.  Blood pressure is currently stable with normal saline volume replacement.  The plan is culture blood and urine; empiric broad spectrum IV antibiotics (patient has apparently received Zosyn in the past without problems); urology consult regarding possible need for abscess drainage.  2.  Altered mental status.  Likely due to acute infection; plan is supportive care; treat underlying medical condition.  3.  Pressure ulcers.  Wound care consult.  4.  CODE STATUS.  Need to confirm current CODE STATUS.  Would discuss with family and with her [facility] physician, as well as nursing facility staff, to see if there is documentation of an advanced directive.

## 2012-03-26 NOTE — Progress Notes (Signed)
ANTIBIOTIC CONSULT NOTE - INITIAL  Pharmacy Consult for vancomycin/zosyn Indication: Sepsis with likely urinary source  Allergies  Allergen Reactions  . Penicillins Other (See Comments)    Previously tolerated Zosyn x7 days    Patient Measurements:   Adjusted Body Weight:   Vital Signs: Temp: 99.3 F (37.4 C) (04/02 2330) Temp src: Rectal (04/02 2330) BP: 129/79 mmHg (04/02 2330) Pulse Rate: 103  (04/02 2330) Intake/Output from previous day:   Intake/Output from this shift:    Labs:  [facility] 03/25/12 2205  WBC 6.4  HGB 11.7*  PLT 340  LABCREA --  CREATININE 0.68   The CrCl is unknown because both a height and weight (above a minimum accepted value) are required for this calculation. No results found for this basename: VANCOTROUGH:2,VANCOPEAK:2,VANCORANDOM:2,GENTTROUGH:2,GENTPEAK:2,GENTRANDOM:2,TOBRATROUGH:2,TOBRAPEAK:2,TOBRARND:2,AMIKACINPEAK:2,AMIKACINTROU:2,AMIKACIN:2, in the last 72 hours   Microbiology: Recent Results (from the past 720 hour(s))  CULTURE, BLOOD (ROUTINE X 2)     Status: Normal   Collection Time   02/27/12  7:33 PM      Component Value Range Status Comment   Specimen Description BLOOD RIGHT HAND   Final    Special Requests BOTTLES DRAWN AEROBIC AND ANAEROBIC 10CC   Final    Culture  Setup Time [id number]   Final    Culture NO GROWTH 5 DAYS   Final    Report Status 03/05/2012 FINAL   Final   CULTURE, BLOOD (ROUTINE X 2)     Status: Normal   Collection Time   02/27/12  7:47 PM      Component Value Range Status Comment   Specimen Description BLOOD LEFT HAND   Final    Special Requests BOTTLES DRAWN AEROBIC AND ANAEROBIC 10CC   Final    Culture  Setup Time [id number]   Final    Culture NO GROWTH 5 DAYS   Final    Report Status 03/05/2012 FINAL   Final   CULTURE, BLOOD (ROUTINE X 2)     Status: Normal   Collection Time   03/01/12 12:29 AM      Component Value Range Status Comment   Specimen Description BLOOD LEFT HAND   Final    Special  Requests BOTTLES DRAWN AEROBIC ONLY 5CC   Final    Culture  Setup Time [id number]   Final    Culture NO GROWTH 5 DAYS   Final    Report Status 03/08/2012 FINAL   Final   CULTURE, BLOOD (ROUTINE X 2)     Status: Normal   Collection Time   03/02/12 12:29 AM      Component Value Range Status Comment   Specimen Description BLOOD RIGHT ARM   Final    Special Requests     Final    Value: BOTTLES DRAWN AEROBIC AND ANAEROBIC 10CC BLUE,5CC RED   Culture  Setup Time [id number]   Final    Culture     Final    Value: STAPHYLOCOCCUS SPECIES (COAGULASE NEGATIVE)     Note: THE SIGNIFICANCE OF ISOLATING THIS ORGANISM FROM A SINGLE VENIPUNCTURE CANNOT BE PREDICTED WITHOUT FURTHER CLINICAL AND CULTURE CORRELATION. SUSCEPTIBILITIES AVAILABLE ONLY ON REQUEST.     Note: Gram Stain Report Called to,Read Back By and Verified With: WILLIE COLLINS 03/03/12 @1326  BY KRAWS   Report Status 03/04/2012 FINAL   Final   URINE CULTURE     Status: Normal   Collection Time   03/02/12  3:51 AM      Component Value Range Status Comment   Specimen Description URINE, CATHETERIZED  Final    Special Requests NONE   Final    Culture  Setup Time [id number]   Final    Colony Count NO GROWTH   Final    Culture NO GROWTH   Final    Report Status 03/03/2012 FINAL   Final   CULTURE, BLOOD (ROUTINE X 2)     Status: Normal   Collection Time   03/04/12 12:45 PM      Component Value Range Status Comment   Specimen Description BLOOD LEFT ARM   Final    Special Requests BOTTLES DRAWN AEROBIC AND ANAEROBIC 10CC   Final    Culture  Setup Time [id number]   Final    Culture NO GROWTH 5 DAYS   Final    Report Status 03/10/2012 FINAL   Final   CULTURE, BLOOD (ROUTINE X 2)     Status: Normal   Collection Time   03/04/12  1:02 PM      Component Value Range Status Comment   Specimen Description BLOOD LEFT ARM   Final    Special Requests BOTTLES DRAWN AEROBIC AND ANAEROBIC 10CC   Final    Culture  Setup Time [id number]   Final     Culture NO GROWTH 5 DAYS   Final    Report Status 03/10/2012 FINAL   Final   CULTURE, BLOOD (ROUTINE X 2)     Status: Normal   Collection Time   03/19/12  2:44 PM      Component Value Range Status Comment   Specimen Description BLOOD RIGHT HAND   Final    Special Requests BOTTLES DRAWN AEROBIC ONLY [facility]   Final    Culture  Setup Time [id number]   Final    Culture NO GROWTH 5 DAYS   Final    Report Status 03/25/2012 FINAL   Final   CULTURE, BLOOD (ROUTINE X 2)     Status: Normal   Collection Time   03/19/12  2:45 PM      Component Value Range Status Comment   Specimen Description BLOOD LEFT HAND   Final    Special Requests BOTTLES DRAWN AEROBIC ONLY [facility]   Final    Culture  Setup Time [id number]   Final    Culture NO GROWTH 5 DAYS   Final    Report Status 03/25/2012 FINAL   Final   URINE CULTURE     Status: Normal   Collection Time   03/19/12  3:44 PM      Component Value Range Status Comment   Specimen Description URINE, CATHETERIZED   Final    Special Requests Normal   Final    Culture  Setup Time [id number]   Final    Colony Count >=100,000 COLONIES/ML   Final    Culture [facility]   Final    Report Status 03/21/2012 FINAL   Final    Organism ID, Bacteria [facility]   Final     Medical History: Past Medical History  Diagnosis Date  . Multiple sclerosis   . GERD (gastroesophageal reflux disease)   . Depression   . Recurrent UTI   . Recurrent pneumonia   . Chronic constipation   . Diverticulosis     Medications:   (Not in a hospital admission)  Assessment: 71 yo F with recent admission for UTIs 2/2 indwelling catheter and pneumonia admitted today with sepsis and metabolic encephalopathy from likely urinary source.  Received 1 g of ceftriaxone in the ED today.  CrCl ~60  ml/min with Scr of 0.68.  Last wt 169 lb as of 02/24/12 admission.  Note that patient tolerated zosyn on previous admission despite reported pcn allergy.  Goal of Therapy:    Vancomycin trough level 15-20 mcg/ml, appropriate zosyn dosing.  Plan:  1.   Zosyn 3.375G IV q8h given over 4h 2.  Vancomycin 750 mg IV q12h 3.  F/u renal function, cultures, clinical progress   [name] [name] L. Illene Bolus, PharmD, BCPS Clinical Pharmacist Pager: [phone] 03/26/2012 1:06 AM

## 2012-03-26 NOTE — Consult Note (Signed)
WOC consult Note Reason for Consult: Pt with multiple pressure ulcers, all present on admission. Wound type: Sacrum with unstageable wound; 4X1cm, 10% red, 90% yellow slough                       Buttocks with unstageable wound; 1X1cm, 100% yellow slough                       Buttocks with unstageable wound; 1X1.5cm, 100% yellow slough                       Left inner abd fold area with partial thickness fissure 3X.1X.1cm, red                       Left outer ankle with deep tissure injury, evolving into stage 2 wound; 50% purple, 50% red, 1X.5X.1cm                       Left anterior foot with stage 1 wound, red 1X1cm                       Right heel with dark red deep tissue injury .3X.3cm                       Right anterior foot with stage 1 wound, red 1X1cm Pressure Ulcer POA: Yes Drainage (amount, consistency, odor) all sites with scant drainage, no odor. Dressing procedure/placement/frequency: Float heels and order air mattress to reduce pressure.  Foam dressing to protect and promote healing to left ankle and abd fissure.  Santyl for chemical debridement of nonviable tissue to sacrum abd buttocks wounds.   Cammie Mcgee, RN, MSN, [employer]  [phone]

## 2012-03-26 NOTE — Progress Notes (Signed)
Internal Medicine Attending  Date: 03/26/2012  Patient name: [name] [name] Medical record number: [id number] Date of birth: 11-Jun-1941 Age: 70 y.o. Gender: female  Regarding CODE STATUS, patient's nursing facility sent her portable Do Not Resuscitate (DNR) form signed by Dr. [name] to hospital.  I spoke with her son [name] [name] by telephone this afternoon to update him on her clinical status, and he confirmed the DNR code status.

## 2012-03-27 DIAGNOSIS — K651 Peritoneal abscess: Secondary | ICD-10-CM | POA: Diagnosis present

## 2012-03-27 MED ORDER — BIOTENE DRY MOUTH MT LIQD
15.0000 mL | Freq: Two times a day (BID) | OROMUCOSAL | Status: DC
  Administered 2012-03-27 – 2012-04-01 (×11): 15 mL via OROMUCOSAL

## 2012-03-27 MED ORDER — CHLORHEXIDINE GLUCONATE 0.12 % MT SOLN
15.0000 mL | Freq: Two times a day (BID) | OROMUCOSAL | Status: DC
  Administered 2012-03-27 – 2012-04-01 (×12): 15 mL via OROMUCOSAL
  Filled 2012-03-27 (×16): qty 15

## 2012-03-27 MED ORDER — HEPARIN SODIUM (PORCINE) 5000 UNIT/ML IJ SOLN
5000.0000 [IU] | Freq: Three times a day (TID) | INTRAMUSCULAR | Status: DC
  Administered 2012-03-28 – 2012-04-01 (×14): 5000 [IU] via SUBCUTANEOUS
  Filled 2012-03-27 (×18): qty 1

## 2012-03-27 NOTE — Progress Notes (Addendum)
Subjective: States "feeling stinky" when asked how she feels and that her "stomach" hurts  Objective: Vital signs in last 24 hours: Filed Vitals:   03/26/12 1825 03/26/12 1900 03/26/12 2345 03/27/12 0405  BP: 143/77 124/83 144/83 129/68  Pulse: 93 93 94 91  Temp:  97.4 F (36.3 C) 97.2 F (36.2 C) 98.4 F (36.9 C)  TempSrc:  Axillary Axillary Axillary  Resp: 17 20 20 16   Height:      Weight:    162 lb 7.7 oz (73.7 kg)  SpO2: 100% 100% 100% 100%   Weight change: -1 lb 12.2 oz (-0.8 kg)  Intake/Output Summary (Last 24 hours) at 03/27/12 0829 Last data filed at 03/27/12 0600  Gross per 24 hour  Intake 3264.16 ml  Output   1825 ml  Net 1439.16 ml   Physical Exam: General: Elderly white female, Well-developed, well-nourished, in no acute distress; initially sleeping but arousible to voice HEENT: Normocephalic, atraumatic, PERRL, EOMI Lungs: Normal respiratory effort. Clear to auscultation BL without crackles or wheezes anterior fields. Heart: normal rate and regular rhythm, S1 and S2 normal without gallop, murmur, or rubs. Abdomen: BS normoactive. Soft, Nondistended, tender RLQ Extremities: No pretibial edema. Neurologic: oriented x1, lethargic but arousible otherwise nonfocal    Lab Results: Basic Metabolic Panel:  Lab 03/26/12 1610 03/25/12 2205  NA 142 143  K 3.9 4.5  CL 114* 111  CO2 20 23  GLUCOSE 97 89  BUN 20 23  CREATININE 0.57 0.68  CALCIUM 10.2 11.3*  MG -- --  PHOS -- --   Liver Function Tests:  Lab 03/26/12 0650  AST 47*  ALT 26  ALKPHOS 352*  BILITOT 0.2*  PROT 5.2*  ALBUMIN 2.0*    CBC:  Lab 03/26/12 0650 03/25/12 2205  WBC 4.3 6.4  NEUTROABS -- 2.8  HGB 10.3* 11.7*  HCT 33.0* 36.8  MCV 77.1* 75.4*  PLT 294 340   Urinalysis:  Lab 03/25/12 2217  COLORURINE AMBER*  LABSPEC 1.020  PHURINE 5.5  GLUCOSEU NEGATIVE  HGBUR LARGE*  BILIRUBINUR NEGATIVE  KETONESUR NEGATIVE  PROTEINUR 30*  UROBILINOGEN 0.2  NITRITE NEGATIVE    LEUKOCYTESUR LARGE*    Micro Results: Recent Results (from the past 240 hour(s))  CULTURE, BLOOD (ROUTINE X 2)     Status: Normal   Collection Time   03/19/12  2:44 PM      Component Value Range Status Comment   Specimen Description BLOOD RIGHT HAND   Final    Special Requests BOTTLES DRAWN AEROBIC ONLY [facility]   Final    Culture  Setup Time [id number]   Final    Culture NO GROWTH 5 DAYS   Final    Report Status 03/25/2012 FINAL   Final   CULTURE, BLOOD (ROUTINE X 2)     Status: Normal   Collection Time   03/19/12  2:45 PM      Component Value Range Status Comment   Specimen Description BLOOD LEFT HAND   Final    Special Requests BOTTLES DRAWN AEROBIC ONLY [facility]   Final    Culture  Setup Time [id number]   Final    Culture NO GROWTH 5 DAYS   Final    Report Status 03/25/2012 FINAL   Final   URINE CULTURE     Status: Normal   Collection Time   03/19/12  3:44 PM      Component Value Range Status Comment   Specimen Description URINE, CATHETERIZED   Final    Special  Requests Normal   Final    Culture  Setup Time [id number]   Final    Colony Count >=100,000 COLONIES/ML   Final    Culture PROVIDENCIA STUARTII   Final    Report Status 03/21/2012 FINAL   Final    Organism ID, Bacteria PROVIDENCIA STUARTII   Final   CULTURE, BLOOD (ROUTINE X 2)     Status: Normal (Preliminary result)   Collection Time   03/25/12  9:34 PM      Component Value Range Status Comment   Specimen Description BLOOD LEFT ARM   Final    Special Requests BOTTLES DRAWN AEROBIC ONLY 2[facility]   Final    Culture  Setup Time [id number]   Final    Culture     Final    Value:        BLOOD CULTURE RECEIVED NO GROWTH TO DATE CULTURE WILL BE HELD FOR 5 DAYS BEFORE ISSUING A FINAL NEGATIVE REPORT   Report Status PENDING   Incomplete   CULTURE, BLOOD (ROUTINE X 2)     Status: Normal (Preliminary result)   Collection Time   03/25/12 10:00 PM      Component Value Range Status Comment   Specimen Description BLOOD LEFT HAND    Final    Special Requests BOTTLES DRAWN AEROBIC AND ANAEROBIC [facility]   Final    Culture  Setup Time [id number]   Final    Culture     Final    Value: GRAM POSITIVE COCCI IN CLUSTERS     Note: Gram Stain Report Called to,Read Back By and Verified With: [facility] DILLON @ 2258 ON 03/26/12 BY GOLLD   Report Status PENDING   Incomplete    Studies/Results: Ct Pelvis Wo Contrast  03/26/2012  *RADIOLOGY REPORT*  Clinical Data:  History of bladder perforation and enlarged.  The patient returns with pain and sepsis.  CT ABDOMEN AND PELVIS WITH CONTRAST, CT pelvis without contrast  Technique:  Multidetector CT imaging of the abdomen and pelvis was performed using the standard protocol following bolus administration of intravenous contrast.  Contrast:  100 ml Omnipaque 300  Comparison:  CT pelvis 03/06/2012.  CT abdomen and pelvis 03/05/2012.  Findings:  Unenhanced pelvis demonstrates residual contrast material in the colon.  Foley catheter in the bladder.  Rounded increased density in the left pelvis likely representing a calcified uterine fibroid.  Contrast enhanced CT abdomen and pelvis was subsequently obtained. There is atelectasis or infiltration in both lung bases with mild nodular pleural thickening.  Diffuse low attenuation change throughout the liver suggesting fatty infiltration.  Mild splenic enlargement.  The gallbladder is surgically absent.  The pancreas is atrophic.  No adrenal gland nodules.  The stomach and small bowel are decompressed.  Stool filled colon without distension. Kidneys are symmetrical with small sub centimeter parenchymal cysts.  No solid mass or hydronephrosis.  No abdominal aortic aneurysm.  No retroperitoneal lymphadenopathy.  No free fluid or free air in the abdomen.  Pelvis with contrast:  A Foley catheter decompresses the bladder. Delayed images demonstrate contrast material in the bladder with evidence of diffuse bladder wall thickening although the bladder wall is not well  demonstrated due to decompression.  Loculated fluid collection in the left pelvis measuring 2 x 5.9 cm.  This is smaller than on the previous study and suggest residual abscess. No new or enlarging fluid collections. There is a gas collection in the anterior pelvis and left pelvis, localized to the area of gas  and fluid on the previous study. This may represent free air or loculated collection in the area of abscess.  The collection is smaller than on the previous study, but there is less fluid today and more air.  This may represent infection with gas forming organism, bladder perforation with air coming from a Foley catheter, or fistula to the colon.  There is interval development of narrowing and wall thickening of the distal sigmoid colon which might represent inflammatory process.  Degenerative changes in the spine.  IMPRESSION: Thickened bladder wall with Foley catheter in place.  Persistent fluid collection in the left pelvis consistent with abscess, smaller than previous study.  Persistent gas collection in the anterior pelvis, similar in size to previous study.  This likely represents a abscess.  Communication to bowel is not excluded. Focal thickening of the wall of the sigmoid colon which may represent inflammatory process.  Original Report Authenticated By: Marlon Pel, M.D.   Ct Pelvis W Contrast  03/26/2012  *RADIOLOGY REPORT*  Clinical Data:  Ruptured bladder with pelvic abscesses  CT CYSTOGRAM (CT PELVIS WITHOUT CONTRAST)  Technique:  Multidetector CT imaging through the pelvis was performed after dilute contrast had been introduced into the bladder for the purposes of performing CT cystography.  Comparison:  CT abdomen pelvis dated 03/26/2012 at 01:34 hours  Findings:  Thick-walled bladder with indwelling Foley catheter and a small amount of nondependent gas.  Injected contrast opacifies the bladder lumen.  Extravasation of contrast with associated 3.0 x 12.6 x 3.3 cm fluid collection  anterior to the bladder dome (series 3/image 32), likely extending from the left superior aspect of the bladder dome (series 3/image 33).  Layering contrast extends to a contiguous 2.6 x 4.7 x 3.9 cm gas and fluid collection immediately beneath the left lower abdominal wall (series 3/image 25).  Additional 3.1 x 6.3 x 2.5 cm fluid collection/abscess superior to the bladder does not opacify with contrast (series 3/image 27).  Adjacent uterus is displaced to the left and notable for calcified uterine fibroids.  Ovary is unremarkable.  No right adnexal mass.  Contrast within distal colon/rectum is related to recent prior CT. Colonic diverticulosis.  No pelvic ascites.  Surgical hardware in the left proximal femur, incompletely visualized.  Degenerative changes of the visualized lumbar spine.  IMPRESSION: Persistent bladder perforation with extraluminal contrast within two contiguous fluid collections in the anterior pelvis, as described above.  Additional fluid collection/abscess superior to the bladder does not opacify with contrast.  Original Report Authenticated By: Charline Bills, M.D.   Ct Abdomen Pelvis W Contrast  03/26/2012  *RADIOLOGY REPORT*  Clinical Data:  History of bladder perforation and enlarged.  The patient returns with pain and sepsis.  CT ABDOMEN AND PELVIS WITH CONTRAST, CT pelvis without contrast  Technique:  Multidetector CT imaging of the abdomen and pelvis was performed using the standard protocol following bolus administration of intravenous contrast.  Contrast:  100 ml Omnipaque 300  Comparison:  CT pelvis 03/06/2012.  CT abdomen and pelvis 03/05/2012.  Findings:  Unenhanced pelvis demonstrates residual contrast material in the colon.  Foley catheter in the bladder.  Rounded increased density in the left pelvis likely representing a calcified uterine fibroid.  Contrast enhanced CT abdomen and pelvis was subsequently obtained. There is atelectasis or infiltration in both lung bases with  mild nodular pleural thickening.  Diffuse low attenuation change throughout the liver suggesting fatty infiltration.  Mild splenic enlargement.  The gallbladder is surgically absent.  The pancreas is  atrophic.  No adrenal gland nodules.  The stomach and small bowel are decompressed.  Stool filled colon without distension. Kidneys are symmetrical with small sub centimeter parenchymal cysts.  No solid mass or hydronephrosis.  No abdominal aortic aneurysm.  No retroperitoneal lymphadenopathy.  No free fluid or free air in the abdomen.  Pelvis with contrast:  A Foley catheter decompresses the bladder. Delayed images demonstrate contrast material in the bladder with evidence of diffuse bladder wall thickening although the bladder wall is not well demonstrated due to decompression.  Loculated fluid collection in the left pelvis measuring 2 x 5.9 cm.  This is smaller than on the previous study and suggest residual abscess. No new or enlarging fluid collections. There is a gas collection in the anterior pelvis and left pelvis, localized to the area of gas and fluid on the previous study. This may represent free air or loculated collection in the area of abscess.  The collection is smaller than on the previous study, but there is less fluid today and more air.  This may represent infection with gas forming organism, bladder perforation with air coming from a Foley catheter, or fistula to the colon.  There is interval development of narrowing and wall thickening of the distal sigmoid colon which might represent inflammatory process.  Degenerative changes in the spine.  IMPRESSION: Thickened bladder wall with Foley catheter in place.  Persistent fluid collection in the left pelvis consistent with abscess, smaller than previous study.  Persistent gas collection in the anterior pelvis, similar in size to previous study.  This likely represents a abscess.  Communication to bowel is not excluded. Focal thickening of the wall of  the sigmoid colon which may represent inflammatory process.  Original Report Authenticated By: Marlon Pel, M.D.   Dg Chest Port 1 View  03/25/2012  *RADIOLOGY REPORT*  Clinical Data: Fever.  PORTABLE CHEST - 1 VIEW  Comparison: 03/01/2012  Findings: Shallow inspiration with elevation of the left hemidiaphragm.  Heart size and pulmonary vascularity are normal for technique.  No focal airspace consolidation.  No blunting of costophrenic angles.  No pneumothorax.  No significant change since the previous study.  Increased density behind the heart may represent esophageal hiatal hernia.  IMPRESSION: Shallow inspiration with elevation of the left hemidiaphragm.  No focal consolidation.  Original Report Authenticated By: Marlon Pel, M.D.   Medications: I have reviewed the patient's current medications. Scheduled Meds:   . antiseptic oral rinse  15 mL Mouth Rinse q12n4p  . chlorhexidine  15 mL Mouth Rinse BID  . collagenase   Topical Daily  . heparin  5,000 Units Subcutaneous Q8H  . piperacillin-tazobactam (ZOSYN)  IV  3.375 g Intravenous Q8H  . vancomycin  750 mg Intravenous Q12H   Continuous Infusions:   . sodium chloride 100 mL/hr at 03/27/12 0237   PRN Meds:.acetaminophen, acetaminophen, iohexol, morphine Assessment/Plan: 1) urosepsis and bacteremia: secondary to ruptured bladder March 2013 in setting of bacteruria initially managed with foley catheter placement for drainage, now with pelvic abscesses, afebrile with stable vitals, Bcx grew gm positive cocci in clusters 03/25/2012 , Urine Cx 03/19/2012 positive for Providencia Stuartii >100,000 -- continue NS  IV fluids  -- continue IV Vancomycin and Zosyn   2) pelvic abscesses: Followed by Urology, appreciate recommendations -percutaneous drains per IR supravesical and LLQ near abscess -will continue low dose morphine and Tylenol PR for pain  3) metabolic encephalopathy: resolving, pt improving in level of consciousness but  only oriented to self,  likely secondary to her current infection in setting of polypharmacy after recent admissions and additional medications --continue volume resuscitation and antibiotics and expect resolution of mental status changes although per family pt never reached previous mental baseline after hip surgery  4) hypercalcemia: Resolved with fluid resuscitation -PTH level pending  5) Multiple sclerosis: Patient is n.p.o. will hold home medications. Plan to restart medication for multiple sclerosis when patient's altered mental status is resolved enough so that she can take by mouth medications.   6) Pressure ulcer of the hip, sacrum and bilateral calcanei: present on admission.  -cont wound care recs  7) Normocytic anemia - stable, likely secondary to prior esophageal erosion and anemia of chronic disease. -will cont to monitor  Lab 03/26/12 0650 03/25/12 2205  HGB 10.3* 11.7*  HCT 33.0* 36.8  WBC 4.3 6.4  PLT 294 340    8) DVT prophylaxis: SQ Heparin   9) Disposition: Ms. Laux is continuing to improve in responsiveness and level of awareness. She is able to verbalize although can be difficult to understand due to some gargled atticulation -I have tried to contact both the son and daughter to update them on Ms. Risse status and need for drain placement per Interventional Radiology at the noted numbers in the record, will re-attempt at later time today  Addendum: Spoke with son "Loistine Chance" and updated pt's current status and plan of care.  He agrees with plan for percut tubes per IR and is available on his cell at [phone]   LOS: 2 days   Iyla Balzarini 03/27/2012, 8:29 AM

## 2012-03-27 NOTE — Progress Notes (Signed)
I have talked with the son, Tyshika Baldridge, twice about consent for the drain placement. He has not consented yet. He wanted to talk with his sister before he made the decision. I informed him of the importance of the drain, but he told me that he would get back to me later this evening.  Milinda Cave, RN

## 2012-03-27 NOTE — Progress Notes (Signed)
CRITICAL VALUE ALERT  Critical value received:  Blood cultures Gram positive cocci and clusters   Date of notification:  03/26/12  Time of notification:  2300  Critical value read back:yes  Nurse who received alert:  D. Madelin Rear, RN  MD notified (1st page):  Dr. Candy Sledge  Time of first page:  0000  MD notified (2nd page):  Time of second page:  Responding MD:  Dr. Candy Sledge  Time MD responded:  0005   No new orders received.

## 2012-03-27 NOTE — H&P (Signed)
Agree 

## 2012-03-27 NOTE — H&P (Signed)
[name] [name] is an 71 y.o. female.   Chief Complaint: history of ruptured bladder with bacteruria, bacteremia and urosepsis.  CT reveals abscess collections in the pelvis and the LLQ anterior abdominal wall. HPI: CT worrisome for abscess.  Medical team has spoken with son in regards to percutaneous drain placement.  Review of CT with Dr. Oley Balm reveals that supravesical abscess not amendable to percutaneous approach without affecting other organ structures.  Anterior abdominal wall collection is amendable to drainage. PE consistent with that of medical team earlier today.    See note from medical team below:   Subjective: States "feeling stinky" when asked how she feels and that her "stomach" hurts  Objective: Vital signs in last 24 hours: Filed Vitals:     03/26/12 1825  03/26/12 1900  03/26/12 2345  03/27/12 0405   BP:  143/77  124/83  144/83  129/68   Pulse:  93  93  94  91   Temp:    97.4 F (36.3 C)  97.2 F (36.2 C)  98.4 F (36.9 C)   TempSrc:    Axillary  Axillary  Axillary   Resp:  17  20  20  16    Height:           Weight:        162 lb 7.7 oz (73.7 kg)   SpO2:  100%  100%  100%  100%    Weight change: -1 lb 12.2 oz (-0.8 kg)  Intake/Output Summary (Last 24 hours) at 03/27/12 0829 Last data filed at 03/27/12 0600   Gross per 24 hour   Intake  3264.16 ml   Output    1825 ml   Net  1439.16 ml    Physical Exam: General: Elderly white female, Well-developed, well-nourished, in no acute distress; initially sleeping but arousible to voice HEENT: Normocephalic, atraumatic, PERRL, EOMI Lungs: Normal respiratory effort. Clear to auscultation BL without crackles or wheezes anterior fields. Heart: normal rate and regular rhythm, S1 and S2 normal without gallop, murmur, or rubs. Abdomen: BS normoactive. Soft, Nondistended, tender RLQ Extremities: No pretibial edema. Neurologic: oriented x1, lethargic but arousible otherwise nonfocal    Lab Results: Basic Metabolic  Panel:  Lab  03/26/12 0650  03/25/12 2205   NA  142  143   K  3.9  4.5   CL  114*  111   CO2  20  23   GLUCOSE  97  89   BUN  20  23   CREATININE  0.57  0.68   CALCIUM  10.2  11.3*   MG  --  --   PHOS  --  --    Liver Function Tests:  Lab  03/26/12 0650   AST  47*   ALT  26   ALKPHOS  352*   BILITOT  0.2*   PROT  5.2*   ALBUMIN  2.0*     CBC:  Lab  03/26/12 0650  03/25/12 2205   WBC  4.3  6.4   NEUTROABS  --  2.8   HGB  10.3*  11.7*   HCT  33.0*  36.8   MCV  77.1*  75.4*   PLT  294  340    Urinalysis:  Lab  03/25/12 2217   COLORURINE  AMBER*   LABSPEC  1.020   PHURINE  5.5   GLUCOSEU  NEGATIVE   HGBUR  LARGE*   BILIRUBINUR  NEGATIVE   KETONESUR  NEGATIVE   PROTEINUR  30*   UROBILINOGEN  0.2   NITRITE  NEGATIVE   LEUKOCYTESUR  LARGE*     Micro Results: Recent Results (from the past 240 hour(s))   CULTURE, BLOOD (ROUTINE X 2)     Status: Normal     Collection Time     03/19/12  2:44 PM       Component  Value  Range  Status  Comment     Specimen Description  BLOOD RIGHT HAND     Final       Special Requests  BOTTLES DRAWN AEROBIC ONLY [facility]     Final       Culture  Setup Time  [id number]     Final       Culture  NO GROWTH 5 DAYS     Final       Report Status  03/25/2012 FINAL     Final     CULTURE, BLOOD (ROUTINE X 2)     Status: Normal     Collection Time     03/19/12  2:45 PM       Component  Value  Range  Status  Comment     Specimen Description  BLOOD LEFT HAND     Final       Special Requests  BOTTLES DRAWN AEROBIC ONLY [facility]     Final       Culture  Setup Time  [id number]     Final       Culture  NO GROWTH 5 DAYS     Final       Report Status  03/25/2012 FINAL     Final     URINE CULTURE     Status: Normal     Collection Time     03/19/12  3:44 PM       Component  Value  Range  Status  Comment     Specimen Description  URINE, CATHETERIZED     Final       Special Requests  Normal     Final       Culture  Setup Time  [id number]     Final        Colony Count  >=100,000 COLONIES/ML     Final       Culture  PROVIDENCIA STUARTII     Final       Report Status  03/21/2012 FINAL     Final       Organism ID, Bacteria  PROVIDENCIA STUARTII     Final     CULTURE, BLOOD (ROUTINE X 2)     Status: Normal (Preliminary result)     Collection Time     03/25/12  9:34 PM       Component  Value  Range  Status  Comment     Specimen Description  BLOOD LEFT ARM     Final       Special Requests  BOTTLES DRAWN AEROBIC ONLY 2[facility]     Final       Culture  Setup Time  [id number]     Final       Culture        Final       Value:         BLOOD CULTURE RECEIVED NO GROWTH TO DATE CULTURE WILL BE HELD FOR 5 DAYS BEFORE ISSUING A FINAL NEGATIVE REPORT     Report Status  PENDING     Incomplete     CULTURE,  BLOOD (ROUTINE X 2)     Status: Normal (Preliminary result)     Collection Time     03/25/12 10:00 PM       Component  Value  Range  Status  Comment     Specimen Description  BLOOD LEFT HAND     Final       Special Requests  BOTTLES DRAWN AEROBIC AND ANAEROBIC [facility]     Final       Culture  Setup Time  [id number]     Final       Culture        Final       Value:  GRAM POSITIVE COCCI IN CLUSTERS        Note: Gram Stain Report Called to,Read Back By and Verified With: [facility] DILLON @ 2258 ON 03/26/12 BY GOLLD     Report Status  PENDING     Incomplete      Studies/Results: Ct Pelvis Wo Contrast  03/26/2012  *RADIOLOGY REPORT*  Clinical Data:  History of bladder perforation and enlarged.  The patient returns with pain and sepsis.  CT ABDOMEN AND PELVIS WITH CONTRAST, CT pelvis without contrast  Technique:  Multidetector CT imaging of the abdomen and pelvis was performed using the standard protocol following bolus administration of intravenous contrast.  Contrast:  100 ml Omnipaque 300  Comparison:  CT pelvis 03/06/2012.  CT abdomen and pelvis 03/05/2012.  Findings:  Unenhanced pelvis demonstrates residual contrast material in the colon.  Foley catheter in the  bladder.  Rounded increased density in the left pelvis likely representing a calcified uterine fibroid.  Contrast enhanced CT abdomen and pelvis was subsequently obtained. There is atelectasis or infiltration in both lung bases with mild nodular pleural thickening.  Diffuse low attenuation change throughout the liver suggesting fatty infiltration.  Mild splenic enlargement.  The gallbladder is surgically absent.  The pancreas is atrophic.  No adrenal gland nodules.  The stomach and small bowel are decompressed.  Stool filled colon without distension. Kidneys are symmetrical with small sub centimeter parenchymal cysts.  No solid mass or hydronephrosis.  No abdominal aortic aneurysm.  No retroperitoneal lymphadenopathy.  No free fluid or free air in the abdomen.  Pelvis with contrast:  A Foley catheter decompresses the bladder. Delayed images demonstrate contrast material in the bladder with evidence of diffuse bladder wall thickening although the bladder wall is not well demonstrated due to decompression.  Loculated fluid collection in the left pelvis measuring 2 x 5.9 cm.  This is smaller than on the previous study and suggest residual abscess. No new or enlarging fluid collections. There is a gas collection in the anterior pelvis and left pelvis, localized to the area of gas and fluid on the previous study. This may represent free air or loculated collection in the area of abscess.  The collection is smaller than on the previous study, but there is less fluid today and more air.  This may represent infection with gas forming organism, bladder perforation with air coming from a Foley catheter, or fistula to the colon.  There is interval development of narrowing and wall thickening of the distal sigmoid colon which might represent inflammatory process.  Degenerative changes in the spine.  IMPRESSION: Thickened bladder wall with Foley catheter in place.  Persistent fluid collection in the left pelvis consistent with  abscess, smaller than previous study.  Persistent gas collection in the anterior pelvis, similar in size to previous study.  This likely represents  a abscess.  Communication to bowel is not excluded. Focal thickening of the wall of the sigmoid colon which may represent inflammatory process.  Original Report Authenticated By: Marlon Pel, M.D.   Ct Pelvis W Contrast  03/26/2012  *RADIOLOGY REPORT*  Clinical Data:  Ruptured bladder with pelvic abscesses  CT CYSTOGRAM (CT PELVIS WITHOUT CONTRAST)  Technique:  Multidetector CT imaging through the pelvis was performed after dilute contrast had been introduced into the bladder for the purposes of performing CT cystography.  Comparison:  CT abdomen pelvis dated 03/26/2012 at 01:34 hours  Findings:  Thick-walled bladder with indwelling Foley catheter and a small amount of nondependent gas.  Injected contrast opacifies the bladder lumen.  Extravasation of contrast with associated 3.0 x 12.6 x 3.3 cm fluid collection anterior to the bladder dome (series 3/image 32), likely extending from the left superior aspect of the bladder dome (series 3/image 33).  Layering contrast extends to a contiguous 2.6 x 4.7 x 3.9 cm gas and fluid collection immediately beneath the left lower abdominal wall (series 3/image 25).  Additional 3.1 x 6.3 x 2.5 cm fluid collection/abscess superior to the bladder does not opacify with contrast (series 3/image 27).  Adjacent uterus is displaced to the left and notable for calcified uterine fibroids.  Ovary is unremarkable.  No right adnexal mass.  Contrast within distal colon/rectum is related to recent prior CT. Colonic diverticulosis.  No pelvic ascites.  Surgical hardware in the left proximal femur, incompletely visualized.  Degenerative changes of the visualized lumbar spine.  IMPRESSION: Persistent bladder perforation with extraluminal contrast within two contiguous fluid collections in the anterior pelvis, as described above.  Additional  fluid collection/abscess superior to the bladder does not opacify with contrast.  Original Report Authenticated By: Charline Bills, M.D.   Ct Abdomen Pelvis W Contrast  03/26/2012  *RADIOLOGY REPORT*  Clinical Data:  History of bladder perforation and enlarged.  The patient returns with pain and sepsis.  CT ABDOMEN AND PELVIS WITH CONTRAST, CT pelvis without contrast  Technique:  Multidetector CT imaging of the abdomen and pelvis was performed using the standard protocol following bolus administration of intravenous contrast.  Contrast:  100 ml Omnipaque 300  Comparison:  CT pelvis 03/06/2012.  CT abdomen and pelvis 03/05/2012.  Findings:  Unenhanced pelvis demonstrates residual contrast material in the colon.  Foley catheter in the bladder.  Rounded increased density in the left pelvis likely representing a calcified uterine fibroid.  Contrast enhanced CT abdomen and pelvis was subsequently obtained. There is atelectasis or infiltration in both lung bases with mild nodular pleural thickening.  Diffuse low attenuation change throughout the liver suggesting fatty infiltration.  Mild splenic enlargement.  The gallbladder is surgically absent.  The pancreas is atrophic.  No adrenal gland nodules.  The stomach and small bowel are decompressed.  Stool filled colon without distension. Kidneys are symmetrical with small sub centimeter parenchymal cysts.  No solid mass or hydronephrosis.  No abdominal aortic aneurysm.  No retroperitoneal lymphadenopathy.  No free fluid or free air in the abdomen.  Pelvis with contrast:  A Foley catheter decompresses the bladder. Delayed images demonstrate contrast material in the bladder with evidence of diffuse bladder wall thickening although the bladder wall is not well demonstrated due to decompression.  Loculated fluid collection in the left pelvis measuring 2 x 5.9 cm.  This is smaller than on the previous study and suggest residual abscess. No new or enlarging fluid collections.  There is a gas collection in the  anterior pelvis and left pelvis, localized to the area of gas and fluid on the previous study. This may represent free air or loculated collection in the area of abscess.  The collection is smaller than on the previous study, but there is less fluid today and more air.  This may represent infection with gas forming organism, bladder perforation with air coming from a Foley catheter, or fistula to the colon.  There is interval development of narrowing and wall thickening of the distal sigmoid colon which might represent inflammatory process.  Degenerative changes in the spine.  IMPRESSION: Thickened bladder wall with Foley catheter in place.  Persistent fluid collection in the left pelvis consistent with abscess, smaller than previous study.  Persistent gas collection in the anterior pelvis, similar in size to previous study.  This likely represents a abscess.  Communication to bowel is not excluded. Focal thickening of the wall of the sigmoid colon which may represent inflammatory process.  Original Report Authenticated By: Marlon Pel, M.D.   Dg Chest Port 1 View  03/25/2012  *RADIOLOGY REPORT*  Clinical Data: Fever.  PORTABLE CHEST - 1 VIEW Comparison: 03/01/2012  Findings: Shallow inspiration with elevation of the left hemidiaphragm.  Heart size and pulmonary vascularity are normal for technique.  No focal airspace consolidation.  No blunting of costophrenic angles.  No pneumothorax.  No significant change since the previous study.  Increased density behind the heart may represent esophageal hiatal hernia.  IMPRESSION: Shallow inspiration with elevation of the left hemidiaphragm.  No focal consolidation.  Original Report Authenticated By: Marlon Pel, M.D.    Medications: I have reviewed the patient's current medications. Scheduled Meds:     .  antiseptic oral rinse   15 mL  Mouth Rinse  q12n4p   .  chlorhexidine   15 mL  Mouth Rinse  BID   .  collagenase      Topical  Daily   .  heparin   5,000 Units  Subcutaneous  Q8H   .  piperacillin-tazobactam (ZOSYN)  IV   3.375 g  Intravenous  Q8H   .  vancomycin   750 mg  Intravenous  Q12H    Continuous Infusions:     .  sodium chloride  100 mL/hr at 03/27/12 0237    PRN Meds:.acetaminophen, acetaminophen, iohexol, morphine Assessment/Plan: 1) urosepsis and bacteremia: secondary to ruptured bladder March 2013 in setting of bacteruria initially managed with foley catheter placement for drainage, now with pelvic abscesses, afebrile with stable vitals, Bcx grew gm positive cocci in clusters 03/25/2012 , Urine Cx 03/19/2012 positive for Providencia Stuartii >100,000 -- continue NS  IV fluids   -- continue IV Vancomycin and Zosyn   2) pelvic abscesses: Followed by Urology, appreciate recommendations -percutaneous drains per IR supravesical and LLQ near abscess -will continue low dose morphine and Tylenol PR for pain  3) metabolic encephalopathy: resolving, pt improving in level of consciousness but only oriented to self, likely secondary to her current infection in setting of polypharmacy after recent admissions and additional medications --continue volume resuscitation and antibiotics and expect resolution of mental status changes although per family pt never reached previous mental baseline after hip surgery  4) hypercalcemia: Resolved with fluid resuscitation -PTH level pending  5) Multiple sclerosis: Patient is n.p.o. will hold home medications. Plan to restart medication for multiple sclerosis when patient's altered mental status is resolved enough so that she can take by mouth medications.   6) Pressure ulcer of the hip, sacrum  and bilateral calcanei: present on admission.   -cont wound care recs  7) Normocytic anemia - stable, likely secondary to prior esophageal erosion and anemia of chronic disease. -will cont to monitor  Lab  03/26/12 0650  03/25/12 2205   HGB  10.3*  11.7*   HCT  33.0*  36.8    WBC  4.3  6.4   PLT  294  340     8) DVT prophylaxis: SQ Heparin   9) Disposition: Ms. Dowse is continuing to improve in responsiveness and level of awareness. She is able to verbalize although can be difficult to understand due to some gargled atticulation -I have tried to contact both the son and daughter to update them on Ms. Doorn status and need for drain placement per Interventional Radiology at the noted numbers in the record, will re-attempt at later time today  Addendum: Spoke with son "Loistine Chance" and updated pt's current status and plan of care.  He agrees with plan for percut tubes per IR and is available on his cell at [phone]  LOS: 2 days   SCHOOLER, KAREN 03/27/2012, 8:29 AM     ROS: no new complaints from patient at time of examination, with AMS.   Blood pressure 143/73, pulse 96, temperature 97.7 F (36.5 C), temperature source Axillary, resp. rate 16, height 5\' 8"  (1.727 m), weight 162 lb 7.7 oz (73.7 kg), SpO2 100.00%. Physical Exam : CV: RRR without murmurs rubs or gallops.   Resp: CTA bilaterally.    Assessment/Plan Unable to reach family by phone at time of examination.  Appears medical team has spoken with son in regards to percutaneous drainage.  Will have RN attempt to talk with him again later today informing him that IR will only be able to drain one of the abscesses requested due to location of abscess.  Orders placed to hold thinners this pm for placement of drain tomorrow.   Cayleen Benjamin D 03/27/2012, 2:36 PM

## 2012-03-27 NOTE — Progress Notes (Signed)
The consent form for IR has been signed by her son, Adah Stoneberg. He would like someone to call him when she has the procedure. Thanks!  Milinda Cave, RN

## 2012-03-27 NOTE — ED Provider Notes (Signed)
Medical screening examination/treatment/procedure(s) were conducted as a shared visit with non-physician practitioner(s) and myself.  I personally evaluated the patient during the encounter. Altered mental status. Likely due to dehydration and urinary tract infection. Patient improved somewhat after IV fluids. She'll be admitted to the teaching service.  Juliet Rude. Rubin Payor, MD 03/28/12 0000

## 2012-03-27 NOTE — Progress Notes (Signed)
Internal Medicine Attending  Date: 03/27/2012  Patient name: [name] [name] Medical record number: [id number] Date of birth: [birth year] Age: 71 y.o. Gender: female  I saw and evaluated the patient, and discussed her care with resident Dr. Bosie Clos. I reviewed the note by Dr. Bosie Clos and I agree with the resident's findings and plans as documented in her note.

## 2012-03-28 ENCOUNTER — Encounter (HOSPITAL_COMMUNITY): Payer: Self-pay | Admitting: Radiology

## 2012-03-28 ENCOUNTER — Inpatient Hospital Stay (HOSPITAL_COMMUNITY): Payer: Medicare (Managed Care)

## 2012-03-28 DIAGNOSIS — N390 Urinary tract infection, site not specified: Secondary | ICD-10-CM

## 2012-03-28 DIAGNOSIS — N731 Chronic parametritis and pelvic cellulitis: Secondary | ICD-10-CM

## 2012-03-28 DIAGNOSIS — R7881 Bacteremia: Secondary | ICD-10-CM

## 2012-03-28 LAB — CBC
MCH: 23.5 pg — ABNORMAL LOW (ref 26.0–34.0)
MCV: 76.3 fL — ABNORMAL LOW (ref 78.0–100.0)
Platelets: 308 10*3/uL (ref 150–400)
RDW: 18.4 % — ABNORMAL HIGH (ref 11.5–15.5)

## 2012-03-28 LAB — COMPREHENSIVE METABOLIC PANEL
AST: 42 U/L — ABNORMAL HIGH (ref 0–37)
Albumin: 2.1 g/dL — ABNORMAL LOW (ref 3.5–5.2)
Calcium: 10.1 mg/dL (ref 8.4–10.5)
Creatinine, Ser: 0.38 mg/dL — ABNORMAL LOW (ref 0.50–1.10)
Total Protein: 5.3 g/dL — ABNORMAL LOW (ref 6.0–8.3)

## 2012-03-28 LAB — PROTIME-INR: Prothrombin Time: 16.7 seconds — ABNORMAL HIGH (ref 11.6–15.2)

## 2012-03-28 MED ORDER — FENTANYL CITRATE 0.05 MG/ML IJ SOLN
INTRAMUSCULAR | Status: AC
  Filled 2012-03-28: qty 4

## 2012-03-28 MED ORDER — FENTANYL CITRATE 0.05 MG/ML IJ SOLN
INTRAMUSCULAR | Status: AC | PRN
  Administered 2012-03-28: 12.5 ug via INTRAVENOUS

## 2012-03-28 MED ORDER — MIDAZOLAM HCL 2 MG/2ML IJ SOLN
INTRAMUSCULAR | Status: AC
  Filled 2012-03-28: qty 6

## 2012-03-28 MED ORDER — MIDAZOLAM HCL 5 MG/5ML IJ SOLN
INTRAMUSCULAR | Status: AC | PRN
  Administered 2012-03-28: 1 mg via INTRAVENOUS

## 2012-03-28 MED ORDER — SODIUM CHLORIDE 0.9 % IV SOLN
500.0000 mg | Freq: Two times a day (BID) | INTRAVENOUS | Status: DC
  Administered 2012-03-29 (×2): 500 mg via INTRAVENOUS
  Filled 2012-03-28 (×3): qty 500

## 2012-03-28 MED ORDER — MORPHINE SULFATE 2 MG/ML IJ SOLN
0.5000 mg | INTRAMUSCULAR | Status: DC | PRN
  Filled 2012-03-28: qty 1

## 2012-03-28 MED ORDER — POTASSIUM CHLORIDE 10 MEQ/100ML IV SOLN
10.0000 meq | INTRAVENOUS | Status: AC
  Administered 2012-03-28 (×3): 10 meq via INTRAVENOUS
  Filled 2012-03-28 (×3): qty 100

## 2012-03-28 MED ORDER — MORPHINE SULFATE 2 MG/ML IJ SOLN
0.5000 mg | INTRAMUSCULAR | Status: DC | PRN
  Administered 2012-03-28: 0.5 mg via INTRAVENOUS

## 2012-03-28 NOTE — Clinical Documentation Improvement (Signed)
MALNUTRITION DOCUMENTATION CLARIFICATION  THIS DOCUMENT IS NOT A PERMANENT PART OF THE MEDICAL RECORD  TO RESPOND TO THE THIS QUERY, FOLLOW THE INSTRUCTIONS BELOW:  1. If needed, update documentation for the patient's encounter via the notes activity.  2. Access this query again and click edit on the In [employer].  3. After updating, or not, click F2 to complete all highlighted (required) fields concerning your review. Select "additional documentation in the medical record" OR "no additional documentation provided".  4. Click Sign note button.  5. The deficiency will fall out of your In Basket *Please let us know if you are not able to complete this workflow by phone or e-mail (listed below).  Please update your documentation within the medical record to reflect your response to this query.                                                                                        03/28/12   Dear Dr. Verdell Carmine / Associates,  In a better effort to capture your patient's severity of illness, reflect appropriate length of stay and utilization of resources, a review of the patient medical record has revealed the following indicators.    Based on your clinical judgment, please clarify and document in a progress note and/or discharge summary the clinical condition associated with the following supporting information:  In responding to this query please exercise your independent judgment.  The fact that a query is asked, does not imply that any particular answer is desired or expected.   Per Nutrition consult note on 4/3 patient meeting criteria for "severe malnutrition" if you agree please document in notes or choose a more appropriate diagnosis if known.  Thank you   Possible Clinical Conditions?  _______Mild Malnutrition  _______Moderate Malnutrition _______Severe Malnutrition   _______Protein Calorie Malnutrition _______Severe Protein Calorie Malnutrition _______Other  Condition________________ _______Cannot clinically determine     Supporting Information: 20% wt loss in < 3 mos with est. intake less than 50% for energy requirements   Risk Factors: frequent  hospital admissions past months s/p hip replacement, h/o multiple uti's , age , h/o recurrent pnuemonia,  h/o diverticulosis, multiple pressure ulcers   Signs & Symptoms: Ht 5\' 8"      Wt 164 lbs  BMI:  24.97  Weight  Loss_41lbs over 3mos  Diagnostics:  Albumin level:  2.0 on 4/3   Protein:  5.2  on 4/3  Calcium level: 10.2 on 4/3  Treatment : supplements recommended/ swallowing eval suggested  Nutrition Consult: per criteria meeting "severe malnutrition in the context of acute illness or injury"   You may use possible, probable, or suspect with inpatient documentation. possible, probable, suspected diagnoses MUST be documented at the time of discharge  Reviewed: additional documentation in the medical record  Thank You,  Lavonda Jumbo  Clinical Documentation Specialist RN, BSN:  Pager [phone] HIM off [phone]  Health Information Management [place]

## 2012-03-28 NOTE — Procedures (Signed)
Procedure:  CT guided aspiration of pelvic/abdominal wall fluid Findings:  Extraluminal air and inflamm just deep to anterior abdominal wall much improved since 4/3 scan.  18 G needle aspiration yielded 3 mL of clear fluid.  Sample sent for culture.  Wire advanced and only exits just beyond tip of needle.  No room to allow placement of drain.

## 2012-03-28 NOTE — Clinical Documentation Improvement (Signed)
SEPSIS DOCUMENTATION QUERY  THIS DOCUMENT IS NOT A PERMANENT PART OF THE MEDICAL RECORD  TO RESPOND TO THE THIS QUERY, FOLLOW THE INSTRUCTIONS BELOW:  1. If needed, update documentation for the patient's encounter via the notes activity.  2. Access this query again and click edit on the In [employer].  3. After updating, or not, click F2 to complete all highlighted (required) fields concerning your review. Select "additional documentation in the medical record" OR "no additional documentation provided".  4. Click Sign note button.  5. The deficiency will fall out of your In Basket *Please let us know if you are not able to complete this workflow by phone or e-mail (listed below).  Please update your documentation within the medical record to reflect your response to this query.                                                                                    03/28/12  Dear Dr. Donell Sievert [facility],  In a better effort to capture your patient's severity of illness, reflect appropriate length of stay and utilization of resources, a review of the patient medical record has revealed the following indicators.    Based on your clinical judgment, please clarify and document in a progress note and/or discharge summary the clinical condition associated with the following supporting information:  In responding to this query please exercise your independent judgment.  The fact that a query is asked, does not imply that any particular answer is desired or expected.    Please clarify terminology "urosepsis" .  Is patient septic secondary to uti or does patient just have a uti?   Thank you   Possible Clinical Conditions?  Septicemia / Sepsis Severe Sepsis Septic Shock Sepsis with UTI Other Condition __________________ Cannot clinically Determine _______________  Risk Factors: ruptured bladder, rec dx of UTI (3/27)  Presenting Signs and Symptoms: "The patient is hypotensive,  tachycardic, tachypneic, febrile" ED MD note   Cultures: blood cx growing gram pos cocci in clusters 4/2, urine culture results from 3/27 >=100,000 colonies/ml Providencia Stuartii/ cancelled during this hospital stay on 4/2  Treatment: Vancomycin IV started in ED conts on Zosyn 3.360m IV q 8hours  Reviewed: additional documentation in the medical record  Thank You,  Leonette Most Addison  Clinical Documentation Specialist RN, BSN:  Pager [phone]/ HIM off [phone]  Health Information Management [place]

## 2012-03-28 NOTE — Progress Notes (Signed)
Internal Medicine Attending  Date: 03/28/2012  Patient name: [name] [name] Medical record number: [id number] Date of birth: 1941-05-22 Age: 71 y.o. Gender: female  I saw and evaluated the patient. I reviewed the resident's note by Dr. Bosie Clos and I agree with the resident's findings and plans as documented in her note.

## 2012-03-28 NOTE — Progress Notes (Addendum)
Subjective: Reports wanting to eat and drink with pain in abdomen (points to LLQ)  Objective: Vital signs in last 24 hours: Filed Vitals:   03/27/12 2000 03/28/12 0000 03/28/12 0400 03/28/12 0800  BP: 119/62 112/61 125/57 143/60  Pulse: 97     Temp: 97.9 F (36.6 C) 97 F (36.1 C) 97.3 F (36.3 C) 97.8 F (36.6 C)  TempSrc: Oral Oral Oral Oral  Resp:      Height:      Weight:      SpO2: 97%      Weight change:   Intake/Output Summary (Last 24 hours) at 03/28/12 1028 Last data filed at 03/28/12 0800  Gross per 24 hour  Intake    900 ml  Output   2000 ml  Net  -1100 ml   Physical Exam: General: Elderly white female, Well-developed, well-nourished, in no acute distress but tearful on questioning stating that she wants "something to drink" HEENT: Normocephalic, atraumatic, PERRL, EOMI, poor dentition, moist mucus membranes Lungs: Normal respiratory effort. Clear to auscultation BL without crackles or wheezes anterior fields. Heart: normal rate and regular rhythm, S1 and S2 normal without gallop, murmur, or rubs. Abdomen: BS normoactive. Soft, Nondistended, tender LLQ and suprapubic region Extremities: No pretibial edema. Neurologic: oriented x1, mildly agitated today, started speaking about being in a "[place] old film" at one point and is calling out for her son Alease Frame Results: Basic Metabolic Panel:  Lab 03/28/12 1610 03/26/12 0650  NA 145 142  K 2.9* 3.9  CL 113* 114*  CO2 18* 20  GLUCOSE 64* 97  BUN 10 20  CREATININE 0.38* 0.57  CALCIUM 10.1 10.2  MG -- --  PHOS -- --   Liver Function Tests:  Lab 03/28/12 0840 03/26/12 0650  AST 42* 47*  ALT 25 26  ALKPHOS 321* 352*  BILITOT 0.3 0.2*  PROT 5.3* 5.2*  ALBUMIN 2.1* 2.0*    CBC:  Lab 03/28/12 0840 03/26/12 0650 03/25/12 2205  WBC 3.7* 4.3 --  NEUTROABS -- -- 2.8  HGB 9.9* 10.3* --  HCT 32.2* 33.0* --  MCV 76.3* 77.1* --  PLT 308 294 --   Urinalysis:  Lab 03/25/12 2217  COLORURINE  AMBER*  LABSPEC 1.020  PHURINE 5.5  GLUCOSEU NEGATIVE  HGBUR LARGE*  BILIRUBINUR NEGATIVE  KETONESUR NEGATIVE  PROTEINUR 30*  UROBILINOGEN 0.2  NITRITE NEGATIVE  LEUKOCYTESUR LARGE*    Micro Results: Recent Results (from the past 240 hour(s))  CULTURE, BLOOD (ROUTINE X 2)     Status: Normal   Collection Time   03/19/12  2:44 PM      Component Value Range Status Comment   Specimen Description BLOOD RIGHT HAND   Final    Special Requests BOTTLES DRAWN AEROBIC ONLY [facility]   Final    Culture  Setup Time [id number]   Final    Culture NO GROWTH 5 DAYS   Final    Report Status 03/25/2012 FINAL   Final   CULTURE, BLOOD (ROUTINE X 2)     Status: Normal   Collection Time   03/19/12  2:45 PM      Component Value Range Status Comment   Specimen Description BLOOD LEFT HAND   Final    Special Requests BOTTLES DRAWN AEROBIC ONLY [facility]   Final    Culture  Setup Time [id number]   Final    Culture NO GROWTH 5 DAYS   Final    Report Status 03/25/2012 FINAL   Final  URINE CULTURE     Status: Normal   Collection Time   03/19/12  3:44 PM      Component Value Range Status Comment   Specimen Description URINE, CATHETERIZED   Final    Special Requests Normal   Final    Culture  Setup Time [id number]   Final    Colony Count >=100,000 COLONIES/ML   Final    Culture PROVIDENCIA STUARTII   Final    Report Status 03/21/2012 FINAL   Final    Organism ID, Bacteria PROVIDENCIA STUARTII   Final   CULTURE, BLOOD (ROUTINE X 2)     Status: Normal (Preliminary result)   Collection Time   03/25/12  9:34 PM      Component Value Range Status Comment   Specimen Description BLOOD LEFT ARM   Final    Special Requests BOTTLES DRAWN AEROBIC ONLY 2CC   Final    Culture  Setup Time [id number]   Final    Culture     Final    Value: STAPHYLOCOCCUS SPECIES (COAGULASE NEGATIVE)     Note: RIFAMPIN AND GENTAMICIN SHOULD NOT BE USED AS SINGLE DRUGS FOR TREATMENT OF STAPH INFECTIONS.     Note: Gram Stain Report Called  to,Read Back By and Verified With: ROBIN WOFFORD @ 1021 03/27/12 BY KRAWS   Report Status PENDING   Incomplete   CULTURE, BLOOD (ROUTINE X 2)     Status: Normal (Preliminary result)   Collection Time   03/25/12 10:00 PM      Component Value Range Status Comment   Specimen Description BLOOD LEFT HAND   Final    Special Requests BOTTLES DRAWN AEROBIC AND ANAEROBIC [facility]   Final    Culture  Setup Time [id number]   Final    Culture     Final    Value: STAPHYLOCOCCUS SPECIES (COAGULASE NEGATIVE)     Note: Gram Stain Report Called to,Read Back By and Verified With: [facility] DILLON @ 2258 ON 03/26/12 BY GOLLD   Report Status PENDING   Incomplete    Studies/Results: No results found. Medications: I have reviewed the patient's current medications. Scheduled Meds:    . antiseptic oral rinse  15 mL Mouth Rinse q12n4p  . chlorhexidine  15 mL Mouth Rinse BID  . collagenase   Topical Daily  . heparin  5,000 Units Subcutaneous Q8H  . piperacillin-tazobactam (ZOSYN)  IV  3.375 g Intravenous Q8H  . vancomycin  750 mg Intravenous Q12H  . DISCONTD: heparin  5,000 Units Subcutaneous Q8H   Continuous Infusions:    . sodium chloride 100 mL/hr at 03/28/12 0800   PRN Meds:.acetaminophen, acetaminophen, morphine, DISCONTD: morphine Assessment/Plan: 1) urosepsis and bacteremia: secondary to ruptured bladder March 2013 in setting of bacteruria initially managed with foley catheter placement for drainage, now with pelvic abscesses, afebrile with stable vitals, Bcx grew gm positive cocci in clusters 03/25/2012 , Urine Cx 03/19/2012 positive for Providencia Stuartii >100,000, Anion gap 14 today -- continue NS  IV fluids  -- continue IV Vancomycin and Zosyn  -- given clinical setting of infection, sepsis, npo status and pain will check daily BMEt to follow anion gap  2) pelvic abscesses: Followed by Urology, appreciate recommendations -per IR supravesical percutaneous drains not good option given likelihood of  injury to surrounding organs but LLQ drain near abscess will be attempted later this afternoon -will continue low dose morphine but increase frequency to q3h and Tylenol PR for pain  3) altered mental status: metabolic encephalopathy  resolving, pt improving in level of consciousness but only oriented to self and now with delirium and mild agitation --continue volume resuscitation and antibiotics --will continue to reorient patient with cues and verbalization   4) hypokalemia and hypercalcemia: Resolved with fluid resuscitation, Calcium within normal limits x 2 days, elevated intact PTH indicating adequate parathyroid physiological response -will supplement potassium  5) Multiple sclerosis: Patient is n.p.o. will hold home medications. Plan to restart medication for multiple sclerosis when patient's altered mental status is resolved enough so that she can take by mouth medications.   6) Pressure ulcer of the hip, sacrum and bilateral calcanei: present on admission.  -cont wound care recs  7) Normocytic anemia - stable, likely secondary to prior esophageal erosion and anemia of chronic disease. -will cont to monitor  Lab 03/28/12 0840 03/26/12 0650 03/25/12 2205  HGB 9.9* 10.3* 11.7*  HCT 32.2* 33.0* 36.8  WBC 3.7* 4.3 6.4  PLT 308 294 340    8) DVT prophylaxis: SQ Heparin   9) Disposition: Spoke with son "Loistine Chance" updated on pt's current status today and plan of care.  He has signed consent for percut tubes per IR and is available on his cell at [phone]   LOS: 3 days   [name] [name] 03/28/2012, 10:28 AM

## 2012-03-28 NOTE — Progress Notes (Signed)
ANTIBIOTIC CONSULT NOTE - FOLLOW UP  Pharmacy Consult for Vancomycin/Zosyn Indication: Urosepsis/bacteremia  Allergies  Allergen Reactions  . Penicillins Other (See Comments)    Previously tolerated Zosyn x7 days    Patient Measurements: Height: 5\' 8"  (172.7 cm) Weight: 162 lb 7.7 oz (73.7 kg) IBW/kg (Calculated) : 63.9   Vital Signs: Temp: 97.4 F (36.3 C) (04/05 1100) Temp src: Oral (04/05 1100) BP: 138/66 mmHg (04/05 1400) Pulse Rate: 101  (04/05 1400) Intake/Output from previous day: 04/04 0701 - 04/05 0700 In: 1250 [I.V.:1000; IV Piggyback:250] Out: 1775 [Urine:1775] Intake/Output from this shift: Total I/O In: 350 [IV Piggyback:350] Out: 950 [Urine:950]  Labs:  [facility] 03/28/12 0840 03/26/12 0650 03/25/12 2205  WBC 3.7* 4.3 6.4  HGB 9.9* 10.3* 11.7*  PLT 308 294 340  LABCREA -- -- --  CREATININE 0.38* 0.57 0.68   Estimated Creatinine Clearance: 66 ml/min (by C-G formula based on Cr of 0.38).  Basename 03/28/12 1252  VANCOTROUGH 20.8*  VANCOPEAK --  Drue Dun --  GENTTROUGH --  GENTPEAK --  GENTRANDOM --  TOBRATROUGH --  TOBRAPEAK --  TOBRARND --  AMIKACINPEAK --  AMIKACINTROU --  AMIKACIN --     Microbiology: Recent Results (from the past 720 hour(s))  CULTURE, BLOOD (ROUTINE X 2)     Status: Normal   Collection Time   02/27/12  7:33 PM      Component Value Range Status Comment   Specimen Description BLOOD RIGHT HAND   Final    Special Requests BOTTLES DRAWN AEROBIC AND ANAEROBIC 10CC   Final    Culture  Setup Time [id number]   Final    Culture NO GROWTH 5 DAYS   Final    Report Status 03/05/2012 FINAL   Final   CULTURE, BLOOD (ROUTINE X 2)     Status: Normal   Collection Time   02/27/12  7:47 PM      Component Value Range Status Comment   Specimen Description BLOOD LEFT HAND   Final    Special Requests BOTTLES DRAWN AEROBIC AND ANAEROBIC 10CC   Final    Culture  Setup Time [id number]   Final    Culture NO GROWTH 5 DAYS   Final    Report Status 03/05/2012 FINAL   Final   CULTURE, BLOOD (ROUTINE X 2)     Status: Normal   Collection Time   03/01/12 12:29 AM      Component Value Range Status Comment   Specimen Description BLOOD LEFT HAND   Final    Special Requests BOTTLES DRAWN AEROBIC ONLY 5CC   Final    Culture  Setup Time [id number]   Final    Culture NO GROWTH 5 DAYS   Final    Report Status 03/08/2012 FINAL   Final   CULTURE, BLOOD (ROUTINE X 2)     Status: Normal   Collection Time   03/02/12 12:29 AM      Component Value Range Status Comment   Specimen Description BLOOD RIGHT ARM   Final    Special Requests     Final    Value: BOTTLES DRAWN AEROBIC AND ANAEROBIC 10CC BLUE,5CC RED   Culture  Setup Time [id number]   Final    Culture     Final    Value: STAPHYLOCOCCUS SPECIES (COAGULASE NEGATIVE)     Note: THE SIGNIFICANCE OF ISOLATING THIS ORGANISM FROM A SINGLE VENIPUNCTURE CANNOT BE PREDICTED WITHOUT FURTHER CLINICAL AND CULTURE CORRELATION. SUSCEPTIBILITIES AVAILABLE ONLY ON REQUEST.  Note: Gram Stain Report Called to,Read Back By and Verified With: [facility] COLLINS 03/03/12 @1326  BY KRAWS   Report Status 03/04/2012 FINAL   Final   URINE CULTURE     Status: Normal   Collection Time   03/02/12  3:51 AM      Component Value Range Status Comment   Specimen Description URINE, CATHETERIZED   Final    Special Requests NONE   Final    Culture  Setup Time [id number]   Final    Colony Count NO GROWTH   Final    Culture NO GROWTH   Final    Report Status 03/03/2012 FINAL   Final   CULTURE, BLOOD (ROUTINE X 2)     Status: Normal   Collection Time   03/04/12 12:45 PM      Component Value Range Status Comment   Specimen Description BLOOD LEFT ARM   Final    Special Requests BOTTLES DRAWN AEROBIC AND ANAEROBIC 10CC   Final    Culture  Setup Time [id number]   Final    Culture NO GROWTH 5 DAYS   Final    Report Status 03/10/2012 FINAL   Final   CULTURE, BLOOD (ROUTINE X 2)     Status: Normal   Collection  Time   03/04/12  1:02 PM      Component Value Range Status Comment   Specimen Description BLOOD LEFT ARM   Final    Special Requests BOTTLES DRAWN AEROBIC AND ANAEROBIC 10CC   Final    Culture  Setup Time [id number]   Final    Culture NO GROWTH 5 DAYS   Final    Report Status 03/10/2012 FINAL   Final   CULTURE, BLOOD (ROUTINE X 2)     Status: Normal   Collection Time   03/19/12  2:44 PM      Component Value Range Status Comment   Specimen Description BLOOD RIGHT HAND   Final    Special Requests BOTTLES DRAWN AEROBIC ONLY [facility]   Final    Culture  Setup Time [id number]   Final    Culture NO GROWTH 5 DAYS   Final    Report Status 03/25/2012 FINAL   Final   CULTURE, BLOOD (ROUTINE X 2)     Status: Normal   Collection Time   03/19/12  2:45 PM      Component Value Range Status Comment   Specimen Description BLOOD LEFT HAND   Final    Special Requests BOTTLES DRAWN AEROBIC ONLY [facility]   Final    Culture  Setup Time [id number]   Final    Culture NO GROWTH 5 DAYS   Final    Report Status 03/25/2012 FINAL   Final   URINE CULTURE     Status: Normal   Collection Time   03/19/12  3:44 PM      Component Value Range Status Comment   Specimen Description URINE, CATHETERIZED   Final    Special Requests Normal   Final    Culture  Setup Time [id number]   Final    Colony Count >=100,000 COLONIES/ML   Final    Culture PROVIDENCIA STUARTII   Final    Report Status 03/21/2012 FINAL   Final    Organism ID, Bacteria PROVIDENCIA STUARTII   Final   CULTURE, BLOOD (ROUTINE X 2)     Status: Normal (Preliminary result)   Collection Time   03/25/12  9:34 PM      Component Value Range  Status Comment   Specimen Description BLOOD LEFT ARM   Final    Special Requests BOTTLES DRAWN AEROBIC ONLY 2CC   Final    Culture  Setup Time [id number]   Final    Culture     Final    Value: STAPHYLOCOCCUS SPECIES (COAGULASE NEGATIVE)     Note: RIFAMPIN AND GENTAMICIN SHOULD NOT BE USED AS SINGLE DRUGS FOR TREATMENT OF  STAPH INFECTIONS.     Note: Gram Stain Report Called to,Read Back By and Verified With: ROBIN WOFFORD @ 1021 03/27/12 BY KRAWS   Report Status PENDING   Incomplete   CULTURE, BLOOD (ROUTINE X 2)     Status: Normal (Preliminary result)   Collection Time   03/25/12 10:00 PM      Component Value Range Status Comment   Specimen Description BLOOD LEFT HAND   Final    Special Requests BOTTLES DRAWN AEROBIC AND ANAEROBIC [facility] EACH   Final    Culture  Setup Time [id number]   Final    Culture     Final    Value: STAPHYLOCOCCUS SPECIES (COAGULASE NEGATIVE)     Note: Gram Stain Report Called to,Read Back By and Verified With: [facility] DILLON @ 2258 ON 03/26/12 BY GOLLD   Report Status PENDING   Incomplete     Anti-infectives     Start     Dose/Rate Route Frequency Ordered Stop   03/26/12 0200  piperacillin-tazobactam (ZOSYN) IVPB 3.375 g       3.375 g 12.5 mL/hr over 240 Minutes Intravenous Every 8 hours 03/26/12 0112     03/26/12 0200   vancomycin (VANCOCIN) 750 mg in sodium chloride 0.9 % 150 mL IVPB        750 mg 150 mL/hr over 60 Minutes Intravenous Every 12 hours 03/26/12 0112     03/25/12 2300   cefTRIAXone (ROCEPHIN) 1 g in dextrose 5 % 50 mL IVPB        1 g 100 mL/hr over 30 Minutes Intravenous  Once 03/25/12 2255 03/25/12 2349          Assessment: 71 yo F with recent admission for UTIs 2/2 indwelling catheter and pneumonia, discharged on ceftriaxone/cipro now admitted with sepsis and metabolic encephalopathy.  Note that patient tolerated zosyn on previous admission despite reported pcn allergy. Afebrile, WBC wnl, and stable renal function with CrCl ~65 ml/min and Scr of 0.38. BCx x2 from 03/25/12 with 2/2 coag neg staph. UCx from 3/27 (previous admission) with Providencia stuartii sensitive to ceftriaxone, imipenem, and zosyn. Vancomycin trough drawn today at 1252 of ~21 after 5 doses.   Goal of Therapy:  Vancomycin trough level 15-20 mcg/ml  Plan:  1. Continue Zosyn 3.375 g IV q8h  over 4h infusions 2. Change vancomycin to 500 mg IV q12h, due to likelihood of accumulation with subsequent doses 3. F/u renal function, cultures, clinical progress, and length of therapy 4. Vancomycin trough at steady state if clinically warranted  Concha Norway 03/28/2012,2:08 PM

## 2012-03-29 LAB — CBC
MCH: 24 pg — ABNORMAL LOW (ref 26.0–34.0)
Platelets: 284 10*3/uL (ref 150–400)
RBC: 4.33 MIL/uL (ref 3.87–5.11)
WBC: 3.1 10*3/uL — ABNORMAL LOW (ref 4.0–10.5)

## 2012-03-29 LAB — BASIC METABOLIC PANEL
Calcium: 9.8 mg/dL (ref 8.4–10.5)
GFR calc non Af Amer: >90 mL/min (ref >90)
Glucose, Bld: 65 mg/dL — ABNORMAL LOW (ref 70–99)
Sodium: 140 mEq/L (ref 135–145)

## 2012-03-29 LAB — CULTURE, BLOOD (ROUTINE X 2): Culture  Setup Time: 201304030453

## 2012-03-29 MED ORDER — VANCOMYCIN HCL 500 MG IV SOLR
500.0000 mg | Freq: Two times a day (BID) | INTRAVENOUS | Status: DC
  Administered 2012-03-30 – 2012-04-01 (×5): 500 mg via INTRAVENOUS
  Filled 2012-03-29 (×8): qty 500

## 2012-03-29 MED ORDER — POTASSIUM CHLORIDE 10 MEQ/100ML IV SOLN
10.0000 meq | INTRAVENOUS | Status: AC
  Administered 2012-03-29 (×3): 10 meq via INTRAVENOUS
  Filled 2012-03-29 (×3): qty 100

## 2012-03-29 NOTE — Progress Notes (Signed)
Subjective: Reports pain in stomach, no acute events overnight  Objective: Vital signs in last 24 hours: Filed Vitals:   03/28/12 1630 03/28/12 1940 03/29/12 0017 03/29/12 0400  BP: 145/80 130/71 142/74 133/63  Pulse:  94 97 98  Temp:  98.3 F (36.8 C) 97.8 F (36.6 C) 98.9 F (37.2 C)  TempSrc:  Oral Oral Oral  Resp:  20 18 98  Height:      Weight:      SpO2:  100% 100% 100%   Weight change:   Intake/Output Summary (Last 24 hours) at 03/29/12 0737 Last data filed at 03/29/12 0600  Gross per 24 hour  Intake   4400 ml  Output   2250 ml  Net   2150 ml   Physical Exam: General: Elderly white female, Well-developed, well-nourished, in no acute distress, easily arousible HEENT: Normocephalic, atraumatic, PERRL, EOMI, poor dentition, moist mucus membranes Lungs: Normal respiratory effort. Clear to auscultation BL without crackles or wheezes anterior fields. Heart: normal rate and regular rhythm, S1 and S2 normal without gallop, murmur, or rubs appreciated. Abdomen: BS normoactive. Soft, Nondistended, tender LLQ and suprapubic region Extremities: No pretibial edema. Neurologic: oriented x1, less agitated this morning    Lab Results: Basic Metabolic Panel:  Lab 03/28/12 4782 03/26/12 0650  NA 145 142  K 2.9* 3.9  CL 113* 114*  CO2 18* 20  GLUCOSE 64* 97  BUN 10 20  CREATININE 0.38* 0.57  CALCIUM 10.1 10.2  MG -- --  PHOS -- --   Liver Function Tests:  Lab 03/28/12 0840 03/26/12 0650  AST 42* 47*  ALT 25 26  ALKPHOS 321* 352*  BILITOT 0.3 0.2*  PROT 5.3* 5.2*  ALBUMIN 2.1* 2.0*    CBC:  Lab 03/28/12 0840 03/26/12 0650 03/25/12 2205  WBC 3.7* 4.3 --  NEUTROABS -- -- 2.8  HGB 9.9* 10.3* --  HCT 32.2* 33.0* --  MCV 76.3* 77.1* --  PLT 308 294 --   Urinalysis:  Lab 03/25/12 2217  COLORURINE AMBER*  LABSPEC 1.020  PHURINE 5.5  GLUCOSEU NEGATIVE  HGBUR LARGE*  BILIRUBINUR NEGATIVE  KETONESUR NEGATIVE  PROTEINUR 30*  UROBILINOGEN 0.2  NITRITE  NEGATIVE  LEUKOCYTESUR LARGE*    Micro Results: Recent Results (from the past 240 hour(s))  CULTURE, BLOOD (ROUTINE X 2)     Status: Normal   Collection Time   03/19/12  2:44 PM      Component Value Range Status Comment   Specimen Description BLOOD RIGHT HAND   Final    Special Requests BOTTLES DRAWN AEROBIC ONLY [facility]   Final    Culture  Setup Time [id number]   Final    Culture NO GROWTH 5 DAYS   Final    Report Status 03/25/2012 FINAL   Final   CULTURE, BLOOD (ROUTINE X 2)     Status: Normal   Collection Time   03/19/12  2:45 PM      Component Value Range Status Comment   Specimen Description BLOOD LEFT HAND   Final    Special Requests BOTTLES DRAWN AEROBIC ONLY [facility]   Final    Culture  Setup Time [id number]   Final    Culture NO GROWTH 5 DAYS   Final    Report Status 03/25/2012 FINAL   Final   URINE CULTURE     Status: Normal   Collection Time   03/19/12  3:44 PM      Component Value Range Status Comment   Specimen Description URINE,  CATHETERIZED   Final    Special Requests Normal   Final    Culture  Setup Time [id number]   Final    Colony Count >=100,000 COLONIES/ML   Final    Culture PROVIDENCIA STUARTII   Final    Report Status 03/21/2012 FINAL   Final    Organism ID, Bacteria PROVIDENCIA STUARTII   Final   CULTURE, BLOOD (ROUTINE X 2)     Status: Normal (Preliminary result)   Collection Time   03/25/12  9:34 PM      Component Value Range Status Comment   Specimen Description BLOOD LEFT ARM   Final    Special Requests BOTTLES DRAWN AEROBIC ONLY [facility]   Final    Culture  Setup Time [id number]   Final    Culture     Final    Value: STAPHYLOCOCCUS SPECIES (COAGULASE NEGATIVE)     Note: RIFAMPIN AND GENTAMICIN SHOULD NOT BE USED AS SINGLE DRUGS FOR TREATMENT OF STAPH INFECTIONS.     Note: Gram Stain Report Called to,Read Back By and Verified With: ROBIN WOFFORD @ 1021 03/27/12 BY KRAWS   Report Status PENDING   Incomplete   CULTURE, BLOOD (ROUTINE X 2)     Status: Normal  (Preliminary result)   Collection Time   03/25/12 10:00 PM      Component Value Range Status Comment   Specimen Description BLOOD LEFT HAND   Final    Special Requests BOTTLES DRAWN AEROBIC AND ANAEROBIC [facility]   Final    Culture  Setup Time [id number]   Final    Culture     Final    Value: STAPHYLOCOCCUS SPECIES (COAGULASE NEGATIVE)     Note: Gram Stain Report Called to,Read Back By and Verified With: [facility] DILLON @ 2258 ON 03/26/12 BY GOLLD   Report Status PENDING   Incomplete   CULTURE, ROUTINE-ABSCESS     Status: Normal (Preliminary result)   Collection Time   03/28/12  2:48 PM      Component Value Range Status Comment   Specimen Description ABSCESS ABDOMEN   Final    Special Requests FLUID ASPIRATED FROM ANTERIOR WALL   Final    Gram Stain     Final    Value: RARE WBC PRESENT,BOTH PMN AND MONONUCLEAR     NO SQUAMOUS EPITHELIAL CELLS SEEN     NO ORGANISMS SEEN   Culture NO GROWTH 1 DAY   Final    Report Status PENDING   Incomplete    Studies/Results: Ct Aspiration  03/28/2012  *RADIOLOGY REPORT*  Clinical Data: History of severe multiple sclerosis and recent rupture of the bladder.  CT has demonstrated some persistent extraluminal air in the anterior pelvis with associated inflammatory changes and fluid.  A more central fluid collection adjacent to a decompressed bladder is not accessible to percutaneous drainage.  CT GUIDED ASPIRATION OF PELVIC FLUID  Sedation:  1.0 mg IV Versed;  125 mcg IV Fentanyl  Total Moderate Sedation Time: 8 minutes.  Comparison:  CT on 03/26/2012  Procedure:  The procedure, risks, benefits, and alternatives were explained to the patient.  Questions regarding the procedure were encouraged and answered. The patient understands and consents to the procedure.  The lower left abdominal wall was prepped with Betadine in a sterile fashion, and a sterile drape was applied covering the operative field.  A sterile gown and sterile gloves were used for the procedure. Local  anesthesia was provided with 1% Lidocaine.  A segment of extraluminal  air in the left lower quadrant was targeted.  An 18 gauge trocar needle was advanced under CT guidance.  Aspiration was performed.  Fluid was sent for culture analysis.  A guidewire was then advanced.  Complications: None  Findings: Initial imaging shows significant decrease in extraluminal air anterior to the bladder since the prior CT on 03/26/2012. Central fluid between the bladder and uterus is stable in appearance compared to the prior CT and is inaccessible to percutaneous puncture due to surrounding contrast filled colon.  Approximately 3 ml of clear fluid was able to be aspirated from the left sided aspect of the air and inflammatory reaction in the anterior pelvis immediately deep to the abdominal wall.  A guidewire advanced through the needle only exited the needle tip and did not cross over into the rest of the inflammatory reaction. There was no indication to leave a drainage catheter.  IMPRESSION: Significant improvement noted in amount of extraluminal air in the anterior pelvis.  Aspiration along the left lateral aspect of air and surrounding inflammation yielded approximately 3 ml of clear fluid which was sent for culture analysis.  A guidewire only barely exiting the tip of the needle and therefore a drain was not placed.  Original Report Authenticated By: Reola Calkins, M.D.   Medications: I have reviewed the patient's current medications. Scheduled Meds:    . antiseptic oral rinse  15 mL Mouth Rinse q12n4p  . chlorhexidine  15 mL Mouth Rinse BID  . collagenase   Topical Daily  . fentaNYL      . heparin  5,000 Units Subcutaneous Q8H  . midazolam      . piperacillin-tazobactam (ZOSYN)  IV  3.375 g Intravenous Q8H  . potassium chloride  10 mEq Intravenous Q1 Hr x 3  . vancomycin  500 mg Intravenous Q12H  . DISCONTD: vancomycin  750 mg Intravenous Q12H   Continuous Infusions:    . sodium chloride 100 mL/hr at  03/29/12 0626   PRN Meds:.acetaminophen, acetaminophen, fentaNYL, midazolam, morphine, DISCONTD: morphine, DISCONTD: morphine  Assessment/Plan: 1) urosepsis and bacteremia:  afebrile with stable vitals, Bcx grew gm positive cocci in clusters 03/25/2012 , Urine Cx 03/19/2012 positive for Providencia Stuartii >100,000, Aspirate of Abdominal fluid without organisms on gram stain or culture -- continue NS  IV fluids  -- continue IV Vancomycin and Zosyn Day #3 -- CMET and CBC pending  2) pelvic abscesses: Anterior abdominal wall extraluminal air and inflammation below much improved since CT on 03/26/2012, per IR supravesical percutaneous drains not good option given likelihood of injury to surrounding organs, discussed results with Urologist Dr. Brunilda Payor today, no further recommendations -- will continue supportive management with IV antibiotics and Foley -- will consult ID for recommendations re: treatment for abdominal abscesses with recurrent UTI and sepsis -- Urology to speak with family today concerning surgical placement of drains and/or removal of abscesses --will continue low dose morphine and Tylenol PR for pain  3) altered mental status: metabolic encephalopathy stable, improved level of consciousness but only oriented to self which has been her baseline since Hip Surgery in January 2013, no agitation on exam this morning --continue antibiotics and IV fluid maintenance --will continue to reorient patient with cues and verbalization   4) hypokalemia and hypercalcemia: Supplemented, Calcium within normal limits x 2 days, elevated intact PTH indicating adequate parathyroid physiological response -CMET pending  5) Multiple sclerosis: stable --Plan to restart medication for multiple sclerosis when patient's altered mental status is resolved enough so that she  can take by mouth medications.  --speech therapy to assess swallow  6) Pressure ulcer of the hip, sacrum and bilateral calcanei: present on  admission.  -cont wound care recs  7) Normocytic anemia - stable, likely secondary to prior esophageal erosion and anemia of chronic disease. -will cont to monitor  Lab 03/28/12 0840 03/26/12 0650 03/25/12 2205  HGB 9.9* 10.3* 11.7*  HCT 32.2* 33.0* 36.8  WBC 3.7* 4.3 6.4  PLT 308 294 340    8) DVT prophylaxis: SQ Heparin   9) Disposition: Spoke with daughter Michele Mcalpine [phone] on several occasions today, she she is HCPOA, elects for DNI status not DNR, would like Urology to explain surgical options with her and her brother Aneta Mins concerning issue with abscess not able to be drained and recurrent UTIs. -case discussed with with Dr. [name] ([facility]-PCP) and Dr. Brunilda Payor (Urology) to assist with long-term plan of care for Ms. [name] [name].   LOS: 4 days   Jalayiah Bibian 03/29/2012, 7:37 AM

## 2012-03-29 NOTE — Progress Notes (Signed)
Subjective: Patient is sleepy but arousible. Family requested a discussion with Urology regarding treatment options. No one is present in the room at this time. Will try to reach her daughter by phone.  Objective: Vital signs in last 24 hours: Temp:  [97.8 F (36.6 C)-98.9 F (37.2 C)] 98.7 F (37.1 C) (04/06 0815) Pulse Rate:  [94-103] 97  (04/06 0815) Resp:  [16-98] 18  (04/06 0815) BP: (114-145)/(63-80) 135/76 mmHg (04/06 0815) SpO2:  [96 %-100 %] 100 % (04/06 0815) Weight:  [171 lb 1.2 oz (77.6 kg)] 171 lb 1.2 oz (77.6 kg) (04/05 1545)  Intake/Output from previous day: 04/05 0701 - 04/06 0700 In: 4400 [I.V.:3700; IV Piggyback:700] Out: 2250 [Urine:2250] Intake/Output this shift: Total I/O In: 223.3 [I.V.:223.3] Out: 1050 [Urine:1050]  Aspiration of pelvic fluid done yesterday by Dr Fredia Sorrow.   Culture of aspirated fluid is negative thus far. Foley draining clear urine.   Lab Results:  [facility] 03/29/12 0903 03/28/12 0840  HGB 10.4* 9.9*  HCT 33.0* 32.2*   BMET  Basename 03/29/12 0903 03/28/12 0840  NA 140 145  K 3.1* 2.9*  CL 110 113*  CO2 15* 18*  GLUCOSE 65* 64*  BUN 5* 10  CREATININE 0.30* 0.38*  CALCIUM 9.8 10.1    Basename 03/28/12 0428  LABPT --  INR 1.33    Studies/Results: Ct Aspiration  03/28/2012  *RADIOLOGY REPORT*  Clinical Data: History of severe multiple sclerosis and recent rupture of the bladder.  CT has demonstrated some persistent extraluminal air in the anterior pelvis with associated inflammatory changes and fluid.  A more central fluid collection adjacent to a decompressed bladder is not accessible to percutaneous drainage.  CT GUIDED ASPIRATION OF PELVIC FLUID  Sedation:  1.0 mg IV Versed;  125 mcg IV Fentanyl  Total Moderate Sedation Time: 8 minutes.  Comparison:  CT on 03/26/2012  Procedure:  The procedure, risks, benefits, and alternatives were explained to the patient.  Questions regarding the procedure were encouraged and  answered. The patient understands and consents to the procedure.  The lower left abdominal wall was prepped with Betadine in a sterile fashion, and a sterile drape was applied covering the operative field.  A sterile gown and sterile gloves were used for the procedure. Local anesthesia was provided with 1% Lidocaine.  A segment of extraluminal air in the left lower quadrant was targeted.  An 18 gauge trocar needle was advanced under CT guidance.  Aspiration was performed.  Fluid was sent for culture analysis.  A guidewire was then advanced.  Complications: None  Findings: Initial imaging shows significant decrease in extraluminal air anterior to the bladder since the prior CT on 03/26/2012. Central fluid between the bladder and uterus is stable in appearance compared to the prior CT and is inaccessible to percutaneous puncture due to surrounding contrast filled colon.  Approximately 3 ml of clear fluid was able to be aspirated from the left sided aspect of the air and inflammatory reaction in the anterior pelvis immediately deep to the abdominal wall.  A guidewire advanced through the needle only exited the needle tip and did not cross over into the rest of the inflammatory reaction. There was no indication to leave a drainage catheter.  IMPRESSION: Significant improvement noted in amount of extraluminal air in the anterior pelvis.  Aspiration along the left lateral aspect of air and surrounding inflammation yielded approximately 3 ml of clear fluid which was sent for culture analysis.  A guidewire only barely exiting the tip of the  needle and therefore a drain was not placed.  Original Report Authenticated By: Reola Calkins, M.D.    Assessment/Plan:  Bladder perforation.  Pelvic abscess.  Continue same management until conversation with family members..    LOS: 4 days   Saphire Barnhart-HENRY 03/29/2012, 12:50 PM

## 2012-03-29 NOTE — Evaluation (Signed)
Clinical/Bedside Swallow Evaluation Patient Details  Name: [name] [name] MRN: [id number] DOB: [birth year] Today's Date: 03/29/2012  HPI: 71  yr old admitted with AMS and found to have severe sepsis, metabolic encephalopathy (?) originating from bladder.  Pt. currently with bladder abscesses and history of multiple sclerosis, recurrent UTI's, pna, GERD, depression.  MBS on 02/26/12 revealed relatively normal oropharyngeal swallow with recommendations of regular, thin liquids.  Swallow reassessed at bedside 03/03/12 which recommended NPO due to decreased alertness.  Pt. became alert over next several days and Dys 1 diet with thin liquids recommended with eventual upgrade to Dys 2.  Pt.'s daughter presently at bedside and reports no difficulty with masticating soft diet at nursing home or with thin  liquids but pt. frequently complained of decreased appetite, not liking the food and food being too salty.  Past Medical History:  Past Medical History  Diagnosis Date  . Multiple sclerosis   . GERD (gastroesophageal reflux disease)   . Depression   . Recurrent UTI   . Recurrent pneumonia   . Chronic constipation   . Diverticulosis    Past Surgical History:  Past Surgical History  Procedure Date  . Cholecystectomy   . Orif humeral shaft fracture 01/2011  . Video assisted thoracoscopy 06/2010  . Closed reduction shoulder dislocation 04/2010    Fracture dislocation  . Femur im nail 01/13/2012    Procedure: INTRAMEDULLARY (IM) RETROGRADE FEMORAL NAILING;  Surgeon: Thera Flake., MD;  Location: [facility] OR;  Service: Orthopedics;  Laterality: Left;  . Femoral artery exploration 01/13/2012    Procedure: FEMORAL ARTERY EXPLORATION;  Surgeon: Pryor Ochoa, MD;  Location: [facility] OR;  Service: Vascular;  Laterality: Left;    Assessment/Recommendations/Treatment Plan  SLP Assessment Clinical Impression Statement: Pt. alert during assessment and consumed thin liquid via straw sips with immediate cough likely due to  larger consecutvie sips.  Single, small sips water via straw were swallowed without evidence of diffuculty.  Initially pt. refused cookie, however, agreeable with encouragement resulting in mildly decreased manipulation/mastication and transit with reports of dislike from pt.  SLP recommends a Dys 2 diet with thin liquids with small straw sips, one at a time, pills whole in applesauce.  Requested pt.'s partial plate be brought to hospital if possible.  Current confusion increases aspiration risk. Risk for Aspiration: Mild Other Related Risk Factors: History of GERD  Swallow Evaluation Recommendations Diet Recommendations: Dysphagia 2 (Fine chop);Thin liquid Liquid Administration via: Cup;Straw Medication Administration: Whole meds with puree Supervision: Full supervision/cueing for compensatory strategies Compensations: Slow rate;Small sips/bites;Check for pocketing (small, single sips from straw) Postural Changes and/or Swallow Maneuvers: Seated upright 90 degrees;Upright 30-60 min after meal Oral Care Recommendations: Oral care QID Follow up Recommendations: None  Treatment Plan Treatment Plan Recommendations: Therapy as outlined in treatment plan below Speech Therapy Frequency: min 2x/week Treatment Duration: 2 weeks Interventions: Aspiration precaution training;Compensatory techniques;Patient/family education;Trials of upgraded texture/liquids;Diet toleration management by SLP  Prognosis Prognosis for Safe Diet Advancement: Good Barriers to Reach Goals: Cognitive deficits  Individuals Consulted Consulted and Agree with Results and Recommendations: Patient;Family member/caregiver  Swallowing Goals  SLP Swallowing Goals Patient will consume recommended diet without observed clinical signs of aspiration with: Moderate assistance Patient will utilize recommended strategies during swallow to increase swallowing safety with: Moderate assistance  Swallow Study General  Oral  Motor/Sensory Function  Overall Oral Motor/Sensory Function: Appears within functional limits for tasks assessed  Consistency Results  Ice Chips Ice chips: Within functional limits  Thin Liquid Thin Liquid:  Impaired Presentation: Straw Pharyngeal  Phase Impairments: Throat Clearing - Immediate (COUGHED WITH CONSECUTIVE STRAW SIPS)  Nectar Thick Liquid Nectar Thick Liquid: Not tested  Honey Thick Liquid Honey Thick Liquid: Not tested  Puree Puree: Within functional limits  Solid Solid: Impaired Oral Phase Impairments: Reduced lingual movement/coordination Oral Phase Functional Implications:  (DELAYED TRANSIT)   Royce Macadamia M.Ed [employer] [phone]  03/29/2012

## 2012-03-29 NOTE — Consult Note (Addendum)
Date of Admission:  03/25/2012  Date of Consult:  03/29/2012  Reason for Consult: Abdominal Abscess Referring Physician: Schooler/Joines  Impression/Recommendation Abdominal Abscesses Bladder Perforation Coag negative staph/MRSE bacteremia Would  Continue vanco,  Repeat BCx Await cx from attempted drain placement. NGTD so far.  Continue zosyn for now.  Comment- the best option for decreasing her risk of recurrence would be to drain her fluid collections and divert her urine. Coag negative staph bacteremia from ? (recent hospital exposure?)  [name] [name] is an 71 y.o. female.  HPI: 71 yo F with Multiple Sclerosis, hospitalization 03-07-12 to 03-11-12 with bladder rupture. She had BCx (-) and UCx (-).  On 3-27 was seen in ED with MS change and was treated with IV fluids, IV ceftriaxone and was d/c back to her SNF and was treated with ceftriaxone and PO cipro. She returns 4-2 with worsening level of responsiveness. She was started on vanco/zosyn.  Her UCx 4-2 grew P stuartii (R-amp, cefazolin, flouroquinolones, aminoglycosides, nitrofurantoin, bactrim). BCx 2/2 CNS.  She inderwent CT scan 4-3 showing: Thickened bladder wall with Foley catheter in place. Persistent fluid collection in the left pelvis consistent with abscess, smaller than previous study. Persistent gas collection in the anterior pelvis, similar in size to previous study. This likely represents a abscess. Communication to bowel is not excluded. Focal thickening of the wall of the sigmoid colon which may represent inflammatory process.  She underwent attempted IR abscess drainage on 4-5, clear fluid obtained.   Past Medical History  Diagnosis Date  . Multiple sclerosis   . GERD (gastroesophageal reflux disease)   . Depression   . Recurrent UTI   . Recurrent pneumonia   . Chronic constipation   . Diverticulosis     Past Surgical History  Procedure Date  . Cholecystectomy   . Orif humeral shaft fracture 01/2011  . Video assisted  thoracoscopy 06/2010  . Closed reduction shoulder dislocation 04/2010    Fracture dislocation  . Femur im nail 01/13/2012    Procedure: INTRAMEDULLARY (IM) RETROGRADE FEMORAL NAILING;  Surgeon: Thera Flake., MD;  Location: [facility] OR;  Service: Orthopedics;  Laterality: Left;  . Femoral artery exploration 01/13/2012    Procedure: FEMORAL ARTERY EXPLORATION;  Surgeon: Pryor Ochoa, MD;  Location: [facility] OR;  Service: Vascular;  Laterality: Left;  ergies:   Allergies  Allergen Reactions  . Penicillins Other (See Comments)    Previously tolerated Zosyn x7 days    Medications:  Scheduled:   . antiseptic oral rinse  15 mL Mouth Rinse q12n4p  . chlorhexidine  15 mL Mouth Rinse BID  . collagenase   Topical Daily  . fentaNYL      . heparin  5,000 Units Subcutaneous Q8H  . midazolam      . piperacillin-tazobactam (ZOSYN)  IV  3.375 g Intravenous Q8H  . potassium chloride  10 mEq Intravenous Q1 Hr x 3  . vancomycin  500 mg Intravenous Q12H  . DISCONTD: vancomycin  750 mg Intravenous Q12H    Social History:  reports that she has never smoked. She has never used smokeless tobacco. She reports that she does not drink alcohol or use illicit drugs.  History reviewed. No pertinent family history.  General ROS: normal BM, doesn't eat solids, decreased appetite, denies abd pain. See HPI.   Blood pressure 137/70, pulse 93, temperature 97.8 F (36.6 C), temperature source Oral, resp. rate 18, height 5\' 8"  (1.727 m), weight 77.6 kg (171 lb 1.2 oz), SpO2 100.00%. General appearance:  alert, cooperative and no distress Eyes: negative findings: conjunctivae and sclerae normal and pupils equal, round, reactive to light and accomodation Throat: abnormal findings: few teeeth. mouth/mucosa not dry.  Neck: supple, symmetrical, trachea midline Lungs: clear to auscultation bilaterally Heart: regular rate and rhythm Abdomen: normal findings: bowel sounds normal and soft, non-tender Extremities: edema trace LE.      Results for orders placed during the hospital encounter of 03/25/12 (from the past 48 hour(s))  PROTIME-INR     Status: Abnormal   Collection Time   03/28/12  4:28 AM      Component Value Range Comment   Prothrombin Time 16.7 (*) 11.6 - 15.2 (seconds)    INR 1.33  0.00 - 1.49    CBC     Status: Abnormal   Collection Time   03/28/12  8:40 AM      Component Value Range Comment   WBC 3.7 (*) 4.0 - 10.5 (K/uL)    RBC 4.22  3.87 - 5.11 (MIL/uL)    Hemoglobin 9.9 (*) 12.0 - 15.0 (g/dL)    HCT 95.6 (*) 21.3 - 46.0 (%)    MCV 76.3 (*) 78.0 - 100.0 (fL)    MCH 23.5 (*) 26.0 - 34.0 (pg)    MCHC 30.7  30.0 - 36.0 (g/dL)    RDW 08.6 (*) 57.8 - 15.5 (%)    Platelets 308  150 - 400 (K/uL)   COMPREHENSIVE METABOLIC PANEL     Status: Abnormal   Collection Time   03/28/12  8:40 AM      Component Value Range Comment   Sodium 145  135 - 145 (mEq/L)    Potassium 2.9 (*) 3.5 - 5.1 (mEq/L)    Chloride 113 (*) 96 - 112 (mEq/L)    CO2 18 (*) 19 - 32 (mEq/L)    Glucose, Bld 64 (*) 70 - 99 (mg/dL)    BUN 10  6 - 23 (mg/dL)    Creatinine, Ser 4.69 (*) 0.50 - 1.10 (mg/dL)    Calcium 62.9  8.4 - 10.5 (mg/dL)    Total Protein 5.3 (*) 6.0 - 8.3 (g/dL)    Albumin 2.1 (*) 3.5 - 5.2 (g/dL)    AST 42 (*) 0 - 37 (U/L)    ALT 25  0 - 35 (U/L)    Alkaline Phosphatase 321 (*) 39 - 117 (U/L)    Total Bilirubin 0.3  0.3 - 1.2 (mg/dL)    GFR calc non Af Amer >90  >90 (mL/min)    GFR calc Af Amer >90  >90 (mL/min)   VANCOMYCIN, TROUGH     Status: Abnormal   Collection Time   03/28/12 12:52 PM      Component Value Range Comment   Vancomycin Tr 20.8 (*) 10.0 - 20.0 (ug/mL)   CULTURE, ROUTINE-ABSCESS     Status: Normal (Preliminary result)   Collection Time   03/28/12  2:48 PM      Component Value Range Comment   Specimen Description ABSCESS ABDOMEN      Special Requests FLUID ASPIRATED FROM ANTERIOR WALL      Gram Stain        Value: RARE WBC PRESENT,BOTH PMN AND MONONUCLEAR     NO SQUAMOUS EPITHELIAL CELLS SEEN      NO ORGANISMS SEEN   Culture NO GROWTH 1 DAY      Report Status PENDING     BASIC METABOLIC PANEL     Status: Abnormal   Collection Time   03/29/12  9:03 AM      Component Value Range Comment   Sodium 140  135 - 145 (mEq/L)    Potassium 3.1 (*) 3.5 - 5.1 (mEq/L)    Chloride 110  96 - 112 (mEq/L)    CO2 15 (*) 19 - 32 (mEq/L)    Glucose, Bld 65 (*) 70 - 99 (mg/dL)    BUN 5 (*) 6 - 23 (mg/dL)    Creatinine, Ser 1.61 (*) 0.50 - 1.10 (mg/dL)    Calcium 9.8  8.4 - 10.5 (mg/dL)    GFR calc non Af Amer >90  >90 (mL/min)    GFR calc Af Amer >90  >90 (mL/min)   CBC     Status: Abnormal   Collection Time   03/29/12  9:03 AM      Component Value Range Comment   WBC 3.1 (*) 4.0 - 10.5 (K/uL)    RBC 4.33  3.87 - 5.11 (MIL/uL)    Hemoglobin 10.4 (*) 12.0 - 15.0 (g/dL)    HCT 09.6 (*) 04.5 - 46.0 (%)    MCV 76.2 (*) 78.0 - 100.0 (fL)    MCH 24.0 (*) 26.0 - 34.0 (pg)    MCHC 31.5  30.0 - 36.0 (g/dL)    RDW 40.9 (*) 81.1 - 15.5 (%)    Platelets 284  150 - 400 (K/uL)       Component Value Date/Time   SDES ABSCESS ABDOMEN 03/28/2012 1448   SPECREQUEST FLUID ASPIRATED FROM ANTERIOR WALL 03/28/2012 1448   CULT NO GROWTH 1 DAY 03/28/2012 1448   REPTSTATUS PENDING 03/28/2012 1448   Ct Aspiration  03/28/2012  *RADIOLOGY REPORT*  Clinical Data: History of severe multiple sclerosis and recent rupture of the bladder.  CT has demonstrated some persistent extraluminal air in the anterior pelvis with associated inflammatory changes and fluid.  A more central fluid collection adjacent to a decompressed bladder is not accessible to percutaneous drainage.  CT GUIDED ASPIRATION OF PELVIC FLUID  Sedation:  1.0 mg IV Versed;  125 mcg IV Fentanyl  Total Moderate Sedation Time: 8 minutes.  Comparison:  CT on 03/26/2012  Procedure:  The procedure, risks, benefits, and alternatives were explained to the patient.  Questions regarding the procedure were encouraged and answered. The patient understands and consents to the  procedure.  The lower left abdominal wall was prepped with Betadine in a sterile fashion, and a sterile drape was applied covering the operative field.  A sterile gown and sterile gloves were used for the procedure. Local anesthesia was provided with 1% Lidocaine.  A segment of extraluminal air in the left lower quadrant was targeted.  An 18 gauge trocar needle was advanced under CT guidance.  Aspiration was performed.  Fluid was sent for culture analysis.  A guidewire was then advanced.  Complications: None  Findings: Initial imaging shows significant decrease in extraluminal air anterior to the bladder since the prior CT on 03/26/2012. Central fluid between the bladder and uterus is stable in appearance compared to the prior CT and is inaccessible to percutaneous puncture due to surrounding contrast filled colon.  Approximately 3 ml of clear fluid was able to be aspirated from the left sided aspect of the air and inflammatory reaction in the anterior pelvis immediately deep to the abdominal wall.  A guidewire advanced through the needle only exited the needle tip and did not cross over into the rest of the inflammatory reaction. There was no indication to leave a drainage catheter.  IMPRESSION: Significant improvement  noted in amount of extraluminal air in the anterior pelvis.  Aspiration along the left lateral aspect of air and surrounding inflammation yielded approximately 3 ml of clear fluid which was sent for culture analysis.  A guidewire only barely exiting the tip of the needle and therefore a drain was not placed.  Original Report Authenticated By: Reola Calkins, M.D.    Thank you so much for this interesting consult,   Johny Sax [phone] 03/29/2012, 1:46 PM

## 2012-03-30 DIAGNOSIS — R4182 Altered mental status, unspecified: Secondary | ICD-10-CM

## 2012-03-30 LAB — BASIC METABOLIC PANEL
CO2: 22 mEq/L (ref 19–32)
Calcium: 9.2 mg/dL (ref 8.4–10.5)
GFR calc non Af Amer: >90 mL/min (ref >90)
Sodium: 136 mEq/L (ref 135–145)

## 2012-03-30 MED ORDER — SODIUM CHLORIDE 0.9 % IJ SOLN: 10.0000 mL | Freq: Two times a day (BID) | INTRAMUSCULAR | Status: DC

## 2012-03-30 MED ORDER — MAGNESIUM SULFATE 40 MG/ML IJ SOLN
2.0000 g | Freq: Once | INTRAMUSCULAR | Status: AC
  Administered 2012-03-31: 2 g via INTRAVENOUS
  Filled 2012-03-30: qty 50

## 2012-03-30 MED ORDER — SODIUM CHLORIDE 0.9 % IJ SOLN
10.0000 mL | INTRAMUSCULAR | Status: DC | PRN
  Administered 2012-04-01: 10 mL

## 2012-03-30 MED ORDER — POTASSIUM CHLORIDE CRYS ER 20 MEQ PO TBCR: 40.0000 meq | EXTENDED_RELEASE_TABLET | ORAL | Status: DC

## 2012-03-30 MED ORDER — POTASSIUM CHLORIDE 10 MEQ/100ML IV SOLN
10.0000 meq | INTRAVENOUS | Status: DC
  Filled 2012-03-30 (×4): qty 100

## 2012-03-30 MED ORDER — POTASSIUM CHLORIDE 10 MEQ/100ML IV SOLN
10.0000 meq | INTRAVENOUS | Status: AC
  Administered 2012-03-30 – 2012-03-31 (×8): 10 meq via INTRAVENOUS
  Filled 2012-03-30 (×12): qty 100

## 2012-03-30 NOTE — Progress Notes (Signed)
Subjective: When asked where she is stated "no tellin", pt seemed pleased when reminded that her [name] visited yesterday, more alert and conversive this morning, denies pain, would like "[name]" to eat  Objective: Vital signs in last 24 hours: Filed Vitals:   03/30/12 0000 03/30/12 0300 03/30/12 0400 03/30/12 0755  BP:  145/68  129/52  Pulse: 94 98 99 100  Temp:  98.4 F (36.9 C)  98.7 F (37.1 C)  TempSrc:  Oral  Axillary  Resp: 20 24 18 20   Height:      Weight:      SpO2: 99% 98% 99% 99%   Weight change:   Intake/Output Summary (Last 24 hours) at 03/30/12 0806 Last data filed at 03/30/12 0756  Gross per 24 hour  Intake 2708.34 ml  Output   4625 ml  Net -1916.66 ml   Physical Exam: General: [name], Well-developed, well-nourished, in no acute distress, awake in bed HEENT: Normocephalic, atraumatic, PERRL, EOMI, poor dentition, moist mucus membranes Lungs: Normal respiratory effort. Clear to auscultation BL without crackles or wheezes anterior fields. Heart: normal rate and regular rhythm, S1 and S2 normal without gallop, murmur, or rubs appreciated. Abdomen: BS normoactive. Soft, Nondistended, nontender today Extremities: No pretibial edema. Neurologic: oriented x1, non-agitated    Lab Results: Basic Metabolic Panel:  Lab 03/29/12 0865 03/28/12 0840  NA 140 145  K 3.1* 2.9*  CL 110 113*  CO2 15* 18*  GLUCOSE 65* 64*  BUN 5* 10  CREATININE 0.30* 0.38*  CALCIUM 9.8 10.1  MG -- --  PHOS -- --   Liver Function Tests:  Lab 03/28/12 0840 03/26/12 0650  AST 42* 47*  ALT 25 26  ALKPHOS 321* 352*  BILITOT 0.3 0.2*  PROT 5.3* 5.2*  ALBUMIN 2.1* 2.0*    CBC:  Lab 03/29/12 0903 03/28/12 0840 03/25/12 2205  WBC 3.1* 3.7* --  NEUTROABS -- -- 2.8  HGB 10.4* 9.9* --  HCT 33.0* 32.2* --  MCV 76.2* 76.3* --  PLT 284 308 --   Urinalysis:  Lab 03/25/12 2217  COLORURINE AMBER*  LABSPEC 1.020  PHURINE 5.5  GLUCOSEU  NEGATIVE  HGBUR LARGE*  BILIRUBINUR NEGATIVE  KETONESUR NEGATIVE  PROTEINUR 30*  UROBILINOGEN 0.2  NITRITE NEGATIVE  LEUKOCYTESUR LARGE*    Micro Results: Recent Results (from the past 240 hour(s))  CULTURE, BLOOD (ROUTINE X 2)     Status: Normal   Collection Time   03/25/12  9:34 PM      Component Value Range Status Comment   Specimen Description BLOOD LEFT ARM   Final    Special Requests BOTTLES DRAWN AEROBIC ONLY 2CC   Final    Culture  Setup Time [id number]   Final    Culture     Final    Value: STAPHYLOCOCCUS SPECIES (COAGULASE NEGATIVE)     Note: RIFAMPIN AND GENTAMICIN SHOULD NOT BE USED AS SINGLE DRUGS FOR TREATMENT OF STAPH INFECTIONS.     Note: Gram Stain Report Called to,Read Back By and Verified With: ROBIN WOFFORD @ 1021 03/27/12 BY KRAWS   Report Status 03/29/2012 FINAL   Final    Organism ID, Bacteria STAPHYLOCOCCUS SPECIES (COAGULASE NEGATIVE)   Final   CULTURE, BLOOD (ROUTINE X 2)     Status: Normal   Collection Time   03/25/12 10:00 PM      Component Value Range Status Comment   Specimen Description BLOOD LEFT HAND   Final    Special Requests BOTTLES DRAWN  AEROBIC AND ANAEROBIC [facility]   Final    Culture  Setup Time [id number]   Final    Culture     Final    Value: STAPHYLOCOCCUS SPECIES (COAGULASE NEGATIVE)     Note: SUSCEPTIBILITIES PERFORMED ON PREVIOUS CULTURE WITHIN THE LAST 5 DAYS.     Note: Gram Stain Report Called to,Read Back By and Verified With: [facility] DILLON @ 2258 ON 03/26/12 BY GOLLD   Report Status 03/29/2012 FINAL   Final   CULTURE, ROUTINE-ABSCESS     Status: Normal (Preliminary result)   Collection Time   03/28/12  2:48 PM      Component Value Range Status Comment   Specimen Description ABSCESS ABDOMEN   Final    Special Requests FLUID ASPIRATED FROM ANTERIOR WALL   Final    Gram Stain     Final    Value: RARE WBC PRESENT,BOTH PMN AND MONONUCLEAR     NO SQUAMOUS EPITHELIAL CELLS SEEN     NO ORGANISMS SEEN   Culture NO GROWTH 2 DAYS   Final     Report Status PENDING   Incomplete    Studies/Results: Ct Aspiration  03/28/2012  *RADIOLOGY REPORT*  Clinical Data: History of severe multiple sclerosis and recent rupture of the bladder.  CT has demonstrated some persistent extraluminal air in the anterior pelvis with associated inflammatory changes and fluid.  A more central fluid collection adjacent to a decompressed bladder is not accessible to percutaneous drainage.  CT GUIDED ASPIRATION OF PELVIC FLUID  Sedation:  1.0 mg IV Versed;  125 mcg IV Fentanyl  Total Moderate Sedation Time: 8 minutes.  Comparison:  CT on 03/26/2012  Procedure:  The procedure, risks, benefits, and alternatives were explained to the patient.  Questions regarding the procedure were encouraged and answered. The patient understands and consents to the procedure.  The lower left abdominal wall was prepped with Betadine in a sterile fashion, and a sterile drape was applied covering the operative field.  A sterile gown and sterile gloves were used for the procedure. Local anesthesia was provided with 1% Lidocaine.  A segment of extraluminal air in the left lower quadrant was targeted.  An 18 gauge trocar needle was advanced under CT guidance.  Aspiration was performed.  Fluid was sent for culture analysis.  A guidewire was then advanced.  Complications: None  Findings: Initial imaging shows significant decrease in extraluminal air anterior to the bladder since the prior CT on 03/26/2012. Central fluid between the bladder and uterus is stable in appearance compared to the prior CT and is inaccessible to percutaneous puncture due to surrounding contrast filled colon.  Approximately 3 ml of clear fluid was able to be aspirated from the left sided aspect of the air and inflammatory reaction in the anterior pelvis immediately deep to the abdominal wall.  A guidewire advanced through the needle only exited the needle tip and did not cross over into the rest of the inflammatory reaction.  There was no indication to leave a drainage catheter.  IMPRESSION: Significant improvement noted in amount of extraluminal air in the anterior pelvis.  Aspiration along the left lateral aspect of air and surrounding inflammation yielded approximately 3 ml of clear fluid which was sent for culture analysis.  A guidewire only barely exiting the tip of the needle and therefore a drain was not placed.  Original Report Authenticated By: Reola Calkins, M.D.   Medications: I have reviewed the patient's current medications. Scheduled Meds:    .  antiseptic oral rinse  15 mL Mouth Rinse q12n4p  . chlorhexidine  15 mL Mouth Rinse BID  . collagenase   Topical Daily  . heparin  5,000 Units Subcutaneous Q8H  . piperacillin-tazobactam (ZOSYN)  IV  3.375 g Intravenous Q8H  . potassium chloride  10 mEq Intravenous Q1 Hr x 3  . vancomycin  500 mg Intravenous Q12H  . DISCONTD: vancomycin  500 mg Intravenous Q12H   Continuous Infusions:    . sodium chloride 100 mL/hr at 03/30/12 0331   PRN Meds:.acetaminophen, acetaminophen, morphine  Assessment/Plan: 1) urosepsis and bacteremia:  afebrile with stable vitals, Bcx grew gm positive cocci in clusters 03/25/2012 , Urine Cx 03/19/2012 positive for Providencia Stuartii >100,000, Aspirate of Abdominal fluid without organisms on gram stain or culture x2d -- decrease NS  IV fluids as pt increases po intake, 100cc/h maintenance for now -- continue IV Vancomycin and Zosyn Day #4, appreciate ID consult recommendation concerning length of therapy -- BMET pending  2) pelvic abscesses: discussed results with Urologist Dr. Brunilda Payor, no further recommendations, pt daughter wishes to speak with Urologist regarding possible surgical interventions "pros and cons", daughter changed code status to DNI -- will continue supportive management with IV antibiotics and Foley --will continue low dose morphine and Tylenol PR for pain  3) altered mental status: metabolic encephalopathy  stable, much improved level of consciousness today but only oriented to self which has been her baseline since Hip Surgery in January 2013, no agitation on exam this morning --continue antibiotics and IV fluid maintenance --will continue to reorient patient with cues and verbalization   4) hypokalemia: Supplemented -potassium level pending this morning   Lab 03/29/12 0903 03/28/12 0840 03/26/12 0650 03/25/12 2205  K 3.1* 2.9* 3.9 4.5    5) Multiple sclerosis: stable, evaluated by Speech therapy with recommendations - Dysphagia 2 diet   6) Pressure ulcer of the hip, sacrum and bilateral calcanei: present on admission.  -cont wound care recs  7) Normocytic anemia - stable, likely secondary to prior esophageal erosion and anemia of chronic disease. -will cont to monitor  Lab 03/29/12 0903 03/28/12 0840 03/26/12 0650  HGB 10.4* 9.9* 10.3*  HCT 33.0* 32.2* 33.0*  WBC 3.1* 3.7* 4.3  PLT 284 308 294    8) DVT prophylaxis: SQ Heparin   9) Disposition: improved clinical status and stable for transfer to telemetry -[name] [phone] HCPOA would like Urology to explain surgical options with her and her brother [name] [name] concerning issue with abscess not able to be drained and recurrent UTIs. -DNI status not DNR,  -case discussed with with Dr. [name] ([facility]-PCP) and Dr. Brunilda Payor (Urology) to assist with long-term plan of care for [name] [name].   LOS: 5 days   Zebediah Beezley 03/30/2012, 8:06 AM

## 2012-03-30 NOTE — Progress Notes (Signed)
Called daughter, Michele Mcalpine and left message on voicemail to call [phone] for her mother's new room number. Renette Butters, Viona Gilmore

## 2012-03-30 NOTE — Progress Notes (Signed)
INFECTIOUS DISEASE PROGRESS NOTE  ID: [name] [name] is a 71 y.o. female with   Principal Problem:  *Sepsis syndrome Active Problems:  PRESSURE ULCER HIP  Infection due to urinary indwelling catheter  Anemia due to chronic illness  Hypercalcemia  Metabolic encephalopathy  Altered mental state  Intra-abdominal abscess  Subjective: Without complaints Abtx:  Anti-infectives     Start     Dose/Rate Route Frequency Ordered Stop   03/29/12 1430   vancomycin (VANCOCIN) 500 mg in sodium chloride 0.9 % 100 mL IVPB        500 mg 100 mL/hr over 60 Minutes Intravenous Every 12 hours 03/29/12 1416     03/29/12 0200   vancomycin (VANCOCIN) 500 mg in sodium chloride 0.9 % 100 mL IVPB  Status:  Discontinued        500 mg 100 mL/hr over 60 Minutes Intravenous Every 12 hours 03/28/12 1443 03/29/12 1412   03/26/12 0200  piperacillin-tazobactam (ZOSYN) IVPB 3.375 g       3.375 g 12.5 mL/hr over 240 Minutes Intravenous Every 8 hours 03/26/12 0112     03/26/12 0200   vancomycin (VANCOCIN) 750 mg in sodium chloride 0.9 % 150 mL IVPB  Status:  Discontinued        750 mg 150 mL/hr over 60 Minutes Intravenous Every 12 hours 03/26/12 0112 03/28/12 1443   03/25/12 2300   cefTRIAXone (ROCEPHIN) 1 g in dextrose 5 % 50 mL IVPB        1 g 100 mL/hr over 30 Minutes Intravenous  Once 03/25/12 2255 03/25/12 2349          Medications:  Scheduled:   . antiseptic oral rinse  15 mL Mouth Rinse q12n4p  . chlorhexidine  15 mL Mouth Rinse BID  . collagenase   Topical Daily  . heparin  5,000 Units Subcutaneous Q8H  . piperacillin-tazobactam (ZOSYN)  IV  3.375 g Intravenous Q8H  . potassium chloride  10 mEq Intravenous Q1 Hr x 3  . vancomycin  500 mg Intravenous Q12H  . DISCONTD: vancomycin  500 mg Intravenous Q12H    Objective: Vital signs in last 24 hours: Temp:  [97.8 F (36.6 C)-98.7 F (37.1 C)] 98.7 F (37.1 C) (04/07 0755) Pulse Rate:  [93-100] 100  (04/07 0755) Resp:  [17-24] 20  (04/07  0755) BP: (129-145)/(52-72) 129/52 mmHg (04/07 0755) SpO2:  [98 %-100 %] 99 % (04/07 0755)   General appearance: alert, cooperative and no distress Resp: clear to auscultation bilaterally Cardio: regular rate and rhythm GI: normal findings: soft, non-tender and abnormal findings:  hypoactive bowel sounds  Lab Results  Basename 03/29/12 0903 03/28/12 0840  WBC 3.1* 3.7*  HGB 10.4* 9.9*  HCT 33.0* 32.2*  NA 140 145  K 3.1* 2.9*  CL 110 113*  CO2 15* 18*  BUN 5* 10  CREATININE 0.30* 0.38*  GLU -- --   Liver Panel  Basename 03/28/12 0840  PROT 5.3*  ALBUMIN 2.1*  AST 42*  ALT 25  ALKPHOS 321*  BILITOT 0.3  BILIDIR --  IBILI --   Sedimentation Rate No results found for this basename: ESRSEDRATE in the last 72 hours C-Reactive Protein No results found for this basename: CRP:2 in the last 72 hours  Microbiology: Recent Results (from the past 240 hour(s))  CULTURE, BLOOD (ROUTINE X 2)     Status: Normal   Collection Time   03/25/12  9:34 PM      Component Value Range Status Comment   Specimen  Description BLOOD LEFT ARM   Final    Special Requests BOTTLES DRAWN AEROBIC ONLY 2CC   Final    Culture  Setup Time [id number]   Final    Culture     Final    Value: STAPHYLOCOCCUS SPECIES (COAGULASE NEGATIVE)     Note: RIFAMPIN AND GENTAMICIN SHOULD NOT BE USED AS SINGLE DRUGS FOR TREATMENT OF STAPH INFECTIONS.     Note: Gram Stain Report Called to,Read Back By and Verified With: ROBIN WOFFORD @ 1021 03/27/12 BY KRAWS   Report Status 03/29/2012 FINAL   Final    Organism ID, Bacteria STAPHYLOCOCCUS SPECIES (COAGULASE NEGATIVE)   Final   CULTURE, BLOOD (ROUTINE X 2)     Status: Normal   Collection Time   03/25/12 10:00 PM      Component Value Range Status Comment   Specimen Description BLOOD LEFT HAND   Final    Special Requests BOTTLES DRAWN AEROBIC AND ANAEROBIC [facility] EACH   Final    Culture  Setup Time [id number]   Final    Culture     Final    Value: STAPHYLOCOCCUS SPECIES  (COAGULASE NEGATIVE)     Note: SUSCEPTIBILITIES PERFORMED ON PREVIOUS CULTURE WITHIN THE LAST 5 DAYS.     Note: Gram Stain Report Called to,Read Back By and Verified With: [facility] DILLON @ 2258 ON 03/26/12 BY GOLLD   Report Status 03/29/2012 FINAL   Final   CULTURE, ROUTINE-ABSCESS     Status: Normal (Preliminary result)   Collection Time   03/28/12  2:48 PM      Component Value Range Status Comment   Specimen Description ABSCESS ABDOMEN   Final    Special Requests FLUID ASPIRATED FROM ANTERIOR WALL   Final    Gram Stain     Final    Value: RARE WBC PRESENT,BOTH PMN AND MONONUCLEAR     NO SQUAMOUS EPITHELIAL CELLS SEEN     NO ORGANISMS SEEN   Culture NO GROWTH 2 DAYS   Final    Report Status PENDING   Incomplete   CULTURE, BLOOD (ROUTINE X 2)     Status: Normal (Preliminary result)   Collection Time   03/29/12  3:55 PM      Component Value Range Status Comment   Specimen Description BLOOD LEFT HAND   Final    Special Requests BOTTLES DRAWN AEROBIC AND ANAEROBIC 10CC   Final    Culture  Setup Time [id number]   Final    Culture     Final    Value:        BLOOD CULTURE RECEIVED NO GROWTH TO DATE CULTURE WILL BE HELD FOR 5 DAYS BEFORE ISSUING A FINAL NEGATIVE REPORT   Report Status PENDING   Incomplete   CULTURE, BLOOD (ROUTINE X 2)     Status: Normal (Preliminary result)   Collection Time   03/29/12  3:55 PM      Component Value Range Status Comment   Specimen Description BLOOD LEFT THUMB   Final    Special Requests BOTTLES DRAWN AEROBIC ONLY 8CC   Final    Culture  Setup Time [id number]   Final    Culture     Final    Value:        BLOOD CULTURE RECEIVED NO GROWTH TO DATE CULTURE WILL BE HELD FOR 5 DAYS BEFORE ISSUING A FINAL NEGATIVE REPORT   Report Status PENDING   Incomplete     Studies/Results: Ct Aspiration  03/28/2012  *RADIOLOGY  REPORT*  Clinical Data: History of severe multiple sclerosis and recent rupture of the bladder.  CT has demonstrated some persistent extraluminal air  in the anterior pelvis with associated inflammatory changes and fluid.  A more central fluid collection adjacent to a decompressed bladder is not accessible to percutaneous drainage.  CT GUIDED ASPIRATION OF PELVIC FLUID  Sedation:  1.0 mg IV Versed;  125 mcg IV Fentanyl  Total Moderate Sedation Time: 8 minutes.  Comparison:  CT on 03/26/2012  Procedure:  The procedure, risks, benefits, and alternatives were explained to the patient.  Questions regarding the procedure were encouraged and answered. The patient understands and consents to the procedure.  The lower left abdominal wall was prepped with Betadine in a sterile fashion, and a sterile drape was applied covering the operative field.  A sterile gown and sterile gloves were used for the procedure. Local anesthesia was provided with 1% Lidocaine.  A segment of extraluminal air in the left lower quadrant was targeted.  An 18 gauge trocar needle was advanced under CT guidance.  Aspiration was performed.  Fluid was sent for culture analysis.  A guidewire was then advanced.  Complications: None  Findings: Initial imaging shows significant decrease in extraluminal air anterior to the bladder since the prior CT on 03/26/2012. Central fluid between the bladder and uterus is stable in appearance compared to the prior CT and is inaccessible to percutaneous puncture due to surrounding contrast filled colon.  Approximately 3 ml of clear fluid was able to be aspirated from the left sided aspect of the air and inflammatory reaction in the anterior pelvis immediately deep to the abdominal wall.  A guidewire advanced through the needle only exited the needle tip and did not cross over into the rest of the inflammatory reaction. There was no indication to leave a drainage catheter.  IMPRESSION: Significant improvement noted in amount of extraluminal air in the anterior pelvis.  Aspiration along the left lateral aspect of air and surrounding inflammation yielded approximately 3  ml of clear fluid which was sent for culture analysis.  A guidewire only barely exiting the tip of the needle and therefore a drain was not placed.  Original Report Authenticated By: Reola Calkins, M.D.     Assessment/Plan: MRSE bacteremia Abd abscesses     Bladder Perforation Decubitus ulcer Vanco/zosyn day 5 Doing ok. No change in anbx, watch her abd, ? Repeat CT.  Wound care f/u   Johny Sax Infectious Diseases [phone] 03/30/2012, 10:37 AM

## 2012-03-31 DIAGNOSIS — N3289 Other specified disorders of bladder: Secondary | ICD-10-CM

## 2012-03-31 DIAGNOSIS — K651 Peritoneal abscess: Secondary | ICD-10-CM

## 2012-03-31 LAB — GLUCOSE, CAPILLARY: Glucose-Capillary: 81 mg/dL (ref 70–99)

## 2012-03-31 LAB — CULTURE, ROUTINE-ABSCESS

## 2012-03-31 LAB — BASIC METABOLIC PANEL
GFR calc Af Amer: >90 mL/min (ref >90)
GFR calc non Af Amer: >90 mL/min (ref >90)
Potassium: 3.7 mEq/L (ref 3.5–5.1)
Sodium: 133 mEq/L — ABNORMAL LOW (ref 135–145)

## 2012-03-31 LAB — VANCOMYCIN, TROUGH: Vancomycin Tr: 16.2 ug/mL (ref 10.0–20.0)

## 2012-03-31 NOTE — Progress Notes (Signed)
Subjective: Alert and oriented to self and place, able to hold coherent and detailed conversation this morning, states that she "feels stupid" because she doesn't know where she lives, reports that she doesn't want to have surgery for drain placement but also doesn't want to "stay like this",   Objective: Vital signs in last 24 hours: Filed Vitals:   03/30/12 1044 03/30/12 1334 03/30/12 2223 03/31/12 0511  BP: 144/86 146/91 124/78 125/84  Pulse: 100 93 90 96  Temp: 98.9 F (37.2 C) 97.2 F (36.2 C) 97.8 F (36.6 C) 98.2 F (36.8 C)  TempSrc: Axillary Axillary Oral Axillary  Resp: 20 19 18 18   Height:      Weight: 162 lb 11.2 oz (73.8 kg)   136 lb 7.4 oz (61.9 kg)  SpO2: 100% 99% 97% 98%   Weight change:   Intake/Output Summary (Last 24 hours) at 03/31/12 0758 Last data filed at 03/31/12 0516  Gross per 24 hour  Intake 2119.17 ml  Output   2650 ml  Net -530.83 ml   Physical Exam: General: Elderly white female, Well-developed, well-nourished, in no acute distress, awake in bed, breakfast at bedside but little eaten other than some eggs HEENT: Normocephalic, atraumatic, PERRL, EOMI, poor dentition, moist mucus membranes, good eye contact Lungs: Normal respiratory effort. Clear to auscultation BL without crackles or wheezes anterior fields. Heart: normal rate and regular rhythm, S1 and S2 normal without gallop, murmur, or rubs appreciated. Abdomen: BS normoactive. Soft, Nondistended, nontender Extremities: No pretibial edema. Neurologic: alert and oriented x2 Psych: appropriate mood and affect, no agitation    Lab Results: Basic Metabolic Panel:  Lab 03/31/12 6295 03/30/12 1637  NA 133* 136  K 3.7 2.2*  CL 103 103  CO2 21 22  GLUCOSE 83 75  BUN 3* 3*  CREATININE 0.28* 0.28*  CALCIUM 9.6 9.2  MG 2.1 --  PHOS -- --   Liver Function Tests:  Lab 03/28/12 0840 03/26/12 0650  AST 42* 47*  ALT 25 26  ALKPHOS 321* 352*  BILITOT 0.3 0.2*  PROT 5.3* 5.2*  ALBUMIN  2.1* 2.0*    CBC:  Lab 03/29/12 0903 03/28/12 0840 03/25/12 2205  WBC 3.1* 3.7* --  NEUTROABS -- -- 2.8  HGB 10.4* 9.9* --  HCT 33.0* 32.2* --  MCV 76.2* 76.3* --  PLT 284 308 --   Urinalysis:  Lab 03/25/12 2217  COLORURINE AMBER*  LABSPEC 1.020  PHURINE 5.5  GLUCOSEU NEGATIVE  HGBUR LARGE*  BILIRUBINUR NEGATIVE  KETONESUR NEGATIVE  PROTEINUR 30*  UROBILINOGEN 0.2  NITRITE NEGATIVE  LEUKOCYTESUR LARGE*    Micro Results: Recent Results (from the past 240 hour(s))  CULTURE, BLOOD (ROUTINE X 2)     Status: Normal   Collection Time   03/25/12  9:34 PM      Component Value Range Status Comment   Specimen Description BLOOD LEFT ARM   Final    Special Requests BOTTLES DRAWN AEROBIC ONLY 2CC   Final    Culture  Setup Time [id number]   Final    Culture     Final    Value: STAPHYLOCOCCUS SPECIES (COAGULASE NEGATIVE)     Note: RIFAMPIN AND GENTAMICIN SHOULD NOT BE USED AS SINGLE DRUGS FOR TREATMENT OF STAPH INFECTIONS.     Note: Gram Stain Report Called to,Read Back By and Verified With: ROBIN WOFFORD @ 1021 03/27/12 BY KRAWS   Report Status 03/29/2012 FINAL   Final    Organism ID, Bacteria STAPHYLOCOCCUS SPECIES (COAGULASE NEGATIVE)  Final   CULTURE, BLOOD (ROUTINE X 2)     Status: Normal   Collection Time   03/25/12 10:00 PM      Component Value Range Status Comment   Specimen Description BLOOD LEFT HAND   Final    Special Requests BOTTLES DRAWN AEROBIC AND ANAEROBIC [facility] EACH   Final    Culture  Setup Time [id number]   Final    Culture     Final    Value: STAPHYLOCOCCUS SPECIES (COAGULASE NEGATIVE)     Note: SUSCEPTIBILITIES PERFORMED ON PREVIOUS CULTURE WITHIN THE LAST 5 DAYS.     Note: Gram Stain Report Called to,Read Back By and Verified With: [facility] DILLON @ 2258 ON 03/26/12 BY GOLLD   Report Status 03/29/2012 FINAL   Final   CULTURE, ROUTINE-ABSCESS     Status: Normal   Collection Time   03/28/12  2:48 PM      Component Value Range Status Comment   Specimen  Description ABSCESS ABDOMEN   Final    Special Requests FLUID ASPIRATED FROM ANTERIOR WALL   Final    Gram Stain     Final    Value: RARE WBC PRESENT,BOTH PMN AND MONONUCLEAR     NO SQUAMOUS EPITHELIAL CELLS SEEN     NO ORGANISMS SEEN   Culture NO GROWTH 3 DAYS   Final    Report Status 03/31/2012 FINAL   Final   CULTURE, BLOOD (ROUTINE X 2)     Status: Normal (Preliminary result)   Collection Time   03/29/12  3:55 PM      Component Value Range Status Comment   Specimen Description BLOOD LEFT HAND   Final    Special Requests BOTTLES DRAWN AEROBIC AND ANAEROBIC 10CC   Final    Culture  Setup Time [id number]   Final    Culture     Final    Value:        BLOOD CULTURE RECEIVED NO GROWTH TO DATE CULTURE WILL BE HELD FOR 5 DAYS BEFORE ISSUING A FINAL NEGATIVE REPORT   Report Status PENDING   Incomplete   CULTURE, BLOOD (ROUTINE X 2)     Status: Normal (Preliminary result)   Collection Time   03/29/12  3:55 PM      Component Value Range Status Comment   Specimen Description BLOOD LEFT THUMB   Final    Special Requests BOTTLES DRAWN AEROBIC ONLY 8CC   Final    Culture  Setup Time [id number]   Final    Culture     Final    Value:        BLOOD CULTURE RECEIVED NO GROWTH TO DATE CULTURE WILL BE HELD FOR 5 DAYS BEFORE ISSUING A FINAL NEGATIVE REPORT   Report Status PENDING   Incomplete    Studies/Results: No results found. Medications: I have reviewed the patient's current medications. Scheduled Meds:    . antiseptic oral rinse  15 mL Mouth Rinse q12n4p  . chlorhexidine  15 mL Mouth Rinse BID  . collagenase   Topical Daily  . heparin  5,000 Units Subcutaneous Q8H  . magnesium sulfate 1 - 4 g bolus IVPB  2 g Intravenous Once  . piperacillin-tazobactam (ZOSYN)  IV  3.375 g Intravenous Q8H  . potassium chloride  10 mEq Intravenous Q1 Hr x 4  . sodium chloride  10-40 mL Intracatheter Q12H  . vancomycin  500 mg Intravenous Q12H  . DISCONTD: potassium chloride  10 mEq Intravenous Q1 Hr x 4    .  DISCONTD: potassium chloride  40 mEq Oral Q4H   Continuous Infusions:    . sodium chloride 100 mL/hr at 03/31/12 0613   PRN Meds:.acetaminophen, acetaminophen, morphine, sodium chloride  Assessment/Plan: 71 yo elderly female with recent bladder perforation managed with foley catheter, h/o recurrent UTI, HD #6 admitted with sepsis, abdominal abscesses and encephalopathy.  1) UTI in setting of sepsis and bacteremia:  afebrile with stable vitals, Bcx grew gm positive cocci in clusters 03/25/2012 , Urine Cx 03/19/2012 positive for Providencia Stuartii >100,000, Aspirate of Abdominal fluid without organisms on gram stain or culture x3d -- cont NS  IV fluids 100cc/h maintenance for now -- continue IV Vancomycin and Zosyn Day #5, appreciate ID consult recommendation concerning length of therapy -- 2nd set of Bcx wgtd x1d  2) pelvic abscesses:  pt daughter wishes to speak with Urologist regarding possible surgical interventions "pros and cons", daughter changed code status to DNI -- will continue supportive management with IV antibiotics and Foley -- will continue low dose morphine and Tylenol PR for pain -- awaiting further Urology recommendations/plan re: surgical intervention vs conservative management after they speak with patient and family  3) altered mental status: metabolic encephalopathy stable, much improved level of consciousness today, oriented to self and place but not date although she knew that Easter had just passed --continue antibiotics and IV fluid maintenance --will continue to reorient patient with cues and verbalization   4) hypokalemia: Supplemented, likely secondary to extrarenal potassium loss through frequent stools -will continue to monitor    Lab 03/31/12 0625 03/30/12 1637 03/29/12 0903 03/28/12 0840 03/26/12 0650  K 3.7 2.2* 3.1* 2.9* 3.9   5) Diarrhea: 5-7 stool daily past 2 days ---check C. Diff  6) Multiple sclerosis: stable, evaluated by Speech therapy  with recommendations - Dysphagia 2 diet   7) Pressure ulcer of the hip, sacrum and bilateral calcanei: present on admission.  -cont wound care recs  8) Normocytic anemia - stable, likely secondary to prior esophageal erosion and anemia of chronic disease. -will cont to monitor  Lab 03/29/12 0903 03/28/12 0840 03/26/12 0650  HGB 10.4* 9.9* 10.3*  HCT 33.0* 32.2* 33.0*  WBC 3.1* 3.7* 4.3  PLT 284 308 294    9) DVT prophylaxis: SQ Heparin   10) Disposition: improved clinical status and stable for transfer to telemetry -Daughter Michele Mcalpine [phone] HCPOA would like Urology to explain surgical options with her and her brother Aneta Mins concerning issue with abscess not able to be drained and recurrent UTIs. -DNI status not DNR,  -case discussed with with Dr. [name] ([facility]-PCP) and (Urology) to assist with long-term plan of care for Ms. Nunnery.   LOS: 6 days   Anden Bartolo 03/31/2012, 7:58 AM

## 2012-03-31 NOTE — Progress Notes (Signed)
Internal Medicine Attending  Date: 03/31/2012  Patient name: [name] [name] Medical record number: [id number] Date of birth: 1941-05-15 Age: 71 y.o. Gender: female  I saw and evaluated the patient. I reviewed the resident's note by Dr. Bosie Clos and I agree with the resident's findings and plans as documented in her note.

## 2012-03-31 NOTE — Progress Notes (Signed)
Subjective: No new complaints   Antibiotics:  Anti-infectives     Start     Dose/Rate Route Frequency Ordered Stop   03/29/12 1430   vancomycin (VANCOCIN) 500 mg in sodium chloride 0.9 % 100 mL IVPB        500 mg 100 mL/hr over 60 Minutes Intravenous Every 12 hours 03/29/12 1416     03/29/12 0200   vancomycin (VANCOCIN) 500 mg in sodium chloride 0.9 % 100 mL IVPB  Status:  Discontinued        500 mg 100 mL/hr over 60 Minutes Intravenous Every 12 hours 03/28/12 1443 03/29/12 1412   03/26/12 0200  piperacillin-tazobactam (ZOSYN) IVPB 3.375 g       3.375 g 12.5 mL/hr over 240 Minutes Intravenous Every 8 hours 03/26/12 0112     03/26/12 0200   vancomycin (VANCOCIN) 750 mg in sodium chloride 0.9 % 150 mL IVPB  Status:  Discontinued        750 mg 150 mL/hr over 60 Minutes Intravenous Every 12 hours 03/26/12 0112 03/28/12 1443   03/25/12 2300   cefTRIAXone (ROCEPHIN) 1 g in dextrose 5 % 50 mL IVPB        1 g 100 mL/hr over 30 Minutes Intravenous  Once 03/25/12 2255 03/25/12 2349          Medications: Scheduled Meds:   . antiseptic oral rinse  15 mL Mouth Rinse q12n4p  . chlorhexidine  15 mL Mouth Rinse BID  . collagenase   Topical Daily  . heparin  5,000 Units Subcutaneous Q8H  . magnesium sulfate 1 - 4 g bolus IVPB  2 g Intravenous Once  . piperacillin-tazobactam (ZOSYN)  IV  3.375 g Intravenous Q8H  . potassium chloride  10 mEq Intravenous Q1 Hr x 4  . sodium chloride  10-40 mL Intracatheter Q12H  . vancomycin  500 mg Intravenous Q12H   Continuous Infusions:   . sodium chloride 100 mL/hr at 03/31/12 0613   PRN Meds:.acetaminophen, acetaminophen, morphine, sodium chloride   Objective: Weight change:   Intake/Output Summary (Last 24 hours) at 03/31/12 1922 Last data filed at 03/31/12 1425  Gross per 24 hour  Intake 4049.17 ml  Output   2075 ml  Net 1974.17 ml   Blood pressure 138/59, pulse 101, temperature 98.4 F (36.9 C), temperature source Axillary, resp.  rate 18, height 5\' 8"  (1.727 m), weight 136 lb 7.4 oz (61.9 kg), SpO2 96.00%. Temp:  [97.8 F (36.6 C)-98.4 F (36.9 C)] 98.4 F (36.9 C) (04/08 1341) Pulse Rate:  [90-101] 101  (04/08 1341) Resp:  [18] 18  (04/08 1341) BP: (124-138)/(59-84) 138/59 mmHg (04/08 1341) SpO2:  [96 %-98 %] 96 % (04/08 1341) Weight:  [136 lb 7.4 oz (61.9 kg)] 136 lb 7.4 oz (61.9 kg) (04/08 0511)  Physical Exam: General: Alert and awake,not in any acute distress. HEENT: anicteric sclera, pupils reactive to light and accommodation, EOMI CVS regular rate, normal r,  no murmur rubs or gallops Chest: clear to auscultation bilaterally, no wheezing, rales or rhonchi Abdomen: soft nontender, nondistended, normal bowel sounds, Skin: no rashes   Lab Results:  Basename 03/29/12 0903  WBC 3.1*  HGB 10.4*  HCT 33.0*  PLT 284    BMET  Basename 03/31/12 0625 03/30/12 1637  NA 133* 136  K 3.7 2.2*  CL 103 103  CO2 21 22  GLUCOSE 83 75  BUN 3* 3*  CREATININE 0.28* 0.28*  CALCIUM 9.6 9.2    Micro Results: Recent Results (  from the past 240 hour(s))  CULTURE, BLOOD (ROUTINE X 2)     Status: Normal   Collection Time   03/25/12  9:34 PM      Component Value Range Status Comment   Specimen Description BLOOD LEFT ARM   Final    Special Requests BOTTLES DRAWN AEROBIC ONLY 2CC   Final    Culture  Setup Time [id number]   Final    Culture     Final    Value: STAPHYLOCOCCUS SPECIES (COAGULASE NEGATIVE)     Note: RIFAMPIN AND GENTAMICIN SHOULD NOT BE USED AS SINGLE DRUGS FOR TREATMENT OF STAPH INFECTIONS.     Note: Gram Stain Report Called to,Read Back By and Verified With: ROBIN WOFFORD @ 1021 03/27/12 BY KRAWS   Report Status 03/29/2012 FINAL   Final    Organism ID, Bacteria STAPHYLOCOCCUS SPECIES (COAGULASE NEGATIVE)   Final   CULTURE, BLOOD (ROUTINE X 2)     Status: Normal   Collection Time   03/25/12 10:00 PM      Component Value Range Status Comment   Specimen Description BLOOD LEFT HAND   Final     Special Requests BOTTLES DRAWN AEROBIC AND ANAEROBIC [facility] EACH   Final    Culture  Setup Time [id number]   Final    Culture     Final    Value: STAPHYLOCOCCUS SPECIES (COAGULASE NEGATIVE)     Note: SUSCEPTIBILITIES PERFORMED ON PREVIOUS CULTURE WITHIN THE LAST 5 DAYS.     Note: Gram Stain Report Called to,Read Back By and Verified With: [facility] DILLON @ 2258 ON 03/26/12 BY GOLLD   Report Status 03/29/2012 FINAL   Final   CULTURE, ROUTINE-ABSCESS     Status: Normal   Collection Time   03/28/12  2:48 PM      Component Value Range Status Comment   Specimen Description ABSCESS ABDOMEN   Final    Special Requests FLUID ASPIRATED FROM ANTERIOR WALL   Final    Gram Stain     Final    Value: RARE WBC PRESENT,BOTH PMN AND MONONUCLEAR     NO SQUAMOUS EPITHELIAL CELLS SEEN     NO ORGANISMS SEEN   Culture NO GROWTH 3 DAYS   Final    Report Status 03/31/2012 FINAL   Final   CULTURE, BLOOD (ROUTINE X 2)     Status: Normal (Preliminary result)   Collection Time   03/29/12  3:55 PM      Component Value Range Status Comment   Specimen Description BLOOD LEFT HAND   Final    Special Requests BOTTLES DRAWN AEROBIC AND ANAEROBIC 10CC   Final    Culture  Setup Time [id number]   Final    Culture     Final    Value:        BLOOD CULTURE RECEIVED NO GROWTH TO DATE CULTURE WILL BE HELD FOR 5 DAYS BEFORE ISSUING A FINAL NEGATIVE REPORT   Report Status PENDING   Incomplete   CULTURE, BLOOD (ROUTINE X 2)     Status: Normal (Preliminary result)   Collection Time   03/29/12  3:55 PM      Component Value Range Status Comment   Specimen Description BLOOD LEFT THUMB   Final    Special Requests BOTTLES DRAWN AEROBIC ONLY [facility]   Final    Culture  Setup Time [id number]   Final    Culture     Final    Value:  BLOOD CULTURE RECEIVED NO GROWTH TO DATE CULTURE WILL BE HELD FOR 5 DAYS BEFORE ISSUING A FINAL NEGATIVE REPORT   Report Status PENDING   Incomplete   CLOSTRIDIUM DIFFICILE BY PCR     Status: Normal    Collection Time   03/31/12  4:47 PM      Component Value Range Status Comment   C difficile by pcr NEGATIVE  NEGATIVE  Final     Studies/Results: No results found.    Assessment/Plan: [name] [name] is a 71 y.o. female with  Multiple intra-abdominal abscesses after bladder rupture, also now with CNS bacteremia  1) INtra-abdominal abscesses: Urology has seen the pt and my understanding is that IR will try again to place a drain --vancomycin and zosyn is reasonable coverage in house --would followup cultures obtained  2) CNS bacteremia: NO clear source. Concern remains for Bacterial endocarditis which with CNS carries a high morbidity and mortality.   I would not PUSH for TEE aggressively in this pt, but I would err on the side of treating her presumptively for possible endocarditis with at least 6 weeks of IV vancomycin     LOS: 6 days   Acey Lav 03/31/2012, 7:22 PM

## 2012-03-31 NOTE — Progress Notes (Addendum)
ANTIBIOTIC CONSULT NOTE - FOLLOW UP  Pharmacy Consult for Vancomycin/Zosyn Indication: Urosepsis/bacteremia  Vancomycin trough = 16.58mcg/ml indicating appropriate clearance.  Trough goal = 15-20 mcg/ml.  No dose changes required.  Will continue to follow.  Allergies  Allergen Reactions  . Penicillins Other (See Comments)    Previously tolerated Zosyn x7 days    Patient Measurements: Height: 5\' 8"  (172.7 cm) Weight: 136 lb 7.4 oz (61.9 kg) (Scale B.) IBW/kg (Calculated) : 63.9   Vital Signs: Temp: 98.2 F (36.8 C) (04/08 0511) Temp src: Axillary (04/08 0511) BP: 125/84 mmHg (04/08 0511) Pulse Rate: 96  (04/08 0511) Intake/Output from previous day: 04/07 0701 - 04/08 0700 In: 4379.2 [P.O.:300; I.V.:2879.2; IV Piggyback:1200] Out: 3300 [Urine:3300] Intake/Output from this shift: Total I/O In: 120 [P.O.:120] Out: 525 [Urine:525]  Labs:  [facility] 03/31/12 0625 03/30/12 1637 03/29/12 0903  WBC -- -- 3.1*  HGB -- -- 10.4*  PLT -- -- 284  LABCREA -- -- --  CREATININE 0.28* 0.28* 0.30*   Estimated Creatinine Clearance: 63.9 ml/min (by C-G formula based on Cr of 0.28).  Basename 03/28/12 1252  VANCOTROUGH 20.8*  VANCOPEAK --  Drue Dun --  GENTTROUGH --  GENTPEAK --  GENTRANDOM --  TOBRATROUGH --  TOBRAPEAK --  TOBRARND --  AMIKACINPEAK --  AMIKACINTROU --  AMIKACIN --     Microbiology: Recent Results (from the past 720 hour(s))  CULTURE, BLOOD (ROUTINE X 2)     Status: Normal   Collection Time - 03/02/12 12:29 AM      Component Value Range Status Comment   Specimen Description BLOOD RIGHT ARM   Final    Value: STAPHYLOCOCCUS SPECIES (COAGULASE NEGATIVE)   Report Status 03/04/2012 FINAL   Final   URINE CULTURE     Status: Normal   Collection Time - 03/02/12  3:51 AM      Component Value Range Status Comment   Specimen Description URINE, CATHETERIZED   Final    Colony Count NO GROWTH   Final    Culture NO GROWTH   Final    Report Status 03/03/2012 FINAL    Final   CULTURE, BLOOD (ROUTINE X 2)     Status: Normal   Collection Time - 03/04/12 12:45 PM      Component Value Range Status Comment   Specimen Description BLOOD LEFT ARM   Final    Culture NO GROWTH 5 DAYS   Final    Report Status 03/10/2012 FINAL   Final   CULTURE, BLOOD (ROUTINE X 2)     Status: Normal   Collection Time - 03/04/12  1:02 PM      Component Value Range Status Comment   Specimen Description BLOOD LEFT ARM   Final    Culture NO GROWTH 5 DAYS   Final    Report Status 03/10/2012 FINAL   Final   CULTURE, BLOOD (ROUTINE X 2)     Status: Normal   Collection Time - 03/19/12  2:44 PM      Component Value Range Status Comment   Specimen Description BLOOD RIGHT HAND   Final    Culture NO GROWTH 5 DAYS   Final    Report Status 03/25/2012 FINAL   Final   CULTURE, BLOOD (ROUTINE X 2)     Status: Normal   Collection Time - 03/19/12  2:45 PM      Component Value Range Status Comment   Specimen Description BLOOD LEFT HAND   Final    Culture NO GROWTH 5  DAYS   Final    Report Status 03/25/2012 FINAL   Final   URINE CULTURE     Status: Normal   Collection Time - 03/19/12  3:44 PM      Component Value Range Status Comment   Specimen Description URINE, CATHETERIZED   Final    Colony Count >=100,000 COLONIES/ML   Final    Culture PROVIDENCIA STUARTII   Final    Report Status 03/21/2012 FINAL   Final    Organism ID, Bacteria PROVIDENCIA STUARTII   Final   CULTURE, BLOOD (ROUTINE X 2)     Status: Normal   Collection Time - 03/25/12  9:34 PM      Component Value Range Status Comment   Specimen Description BLOOD LEFT ARM   Final    Culture     Final    Value: STAPHYLOCOCCUS SPECIES (COAGULASE NEGATIVE)     Note: RIFAMPIN AND GENTAMICIN SHOULD NOT BE USED AS SINGLE DRUGS FOR TREATMENT OF STAPH INFECTIONS.     Note: Gram Stain Report Called to,Read Back By and Verified With: ROBIN WOFFORD @ 1021 03/27/12 BY KRAWS   Report Status 03/29/2012 FINAL   Final    Organism ID, Bacteria  STAPHYLOCOCCUS SPECIES (COAGULASE NEGATIVE)   Final   CULTURE, BLOOD (ROUTINE X 2)     Status: Normal   Collection Time - 03/25/12 10:00 PM      Component Value Range Status Comment   Specimen Description BLOOD LEFT HAND   Final    Culture     Final    Value: STAPHYLOCOCCUS SPECIES (COAGULASE NEGATIVE)     Note: SUSCEPTIBILITIES PERFORMED ON PREVIOUS CULTURE WITHIN THE LAST 5 DAYS.     Note: Gram Stain Report Called to,Read Back By and Verified With: [facility] DILLON @ 2258 ON 03/26/12 BY GOLLD   Report Status 03/29/2012 FINAL   Final   CULTURE, ROUTINE-ABSCESS     Status: Normal   Collection Time - 03/28/12  2:48 PM      Component Value Range Status Comment   Specimen Description ABSCESS ABDOMEN   Final    Special Requests FLUID ASPIRATED FROM ANTERIOR WALL   Final    Gram Stain     Final    Value: RARE WBC PRESENT,BOTH PMN AND MONONUCLEAR     NO SQUAMOUS EPITHELIAL CELLS SEEN     NO ORGANISMS SEEN   Culture NO GROWTH 3 DAYS   Final    Report Status 03/31/2012 FINAL   Final   CULTURE, BLOOD (ROUTINE X 2)     Status: Normal (Preliminary result)   Collection Time - 03/29/12  3:55 PM      Component Value Range Status Comment   Specimen Description BLOOD LEFT HAND   Final    Culture     Final    Value:        BLOOD CULTURE RECEIVED NO GROWTH TO DATE CULTURE WILL BE HELD FOR 5 DAYS BEFORE ISSUING A FINAL NEGATIVE REPORT   Report Status PENDING   Incomplete   CULTURE, BLOOD (ROUTINE X 2)     Status: Normal (Preliminary result)   Collection Time - 03/29/12  3:55 PM      Component Value Range Status Comment   Specimen Description BLOOD LEFT THUMB   Final    Culture     Final    Value:        BLOOD CULTURE RECEIVED NO GROWTH TO DATE CULTURE WILL BE HELD FOR 5 DAYS BEFORE ISSUING  A FINAL NEGATIVE REPORT   Report Status PENDING   Incomplete     Anti-infectives     Start     Dose/Rate Route Frequency Ordered Stop   03/29/12 1430   vancomycin (VANCOCIN) 500 mg in sodium chloride 0.9 % 100 mL IVPB         500 mg 100 mL/hr over 60 Minutes Intravenous Every 12 hours 03/29/12 1416     03/29/12 0200   vancomycin (VANCOCIN) 500 mg in sodium chloride 0.9 % 100 mL IVPB  Status:  Discontinued        500 mg 100 mL/hr over 60 Minutes Intravenous Every 12 hours 03/28/12 1443 03/29/12 1412   03/26/12 0200  piperacillin-tazobactam (ZOSYN) IVPB 3.375 g       3.375 g 12.5 mL/hr over 240 Minutes Intravenous Every 8 hours 03/26/12 0112     03/26/12 0200   vancomycin (VANCOCIN) 750 mg in sodium chloride 0.9 % 150 mL IVPB  Status:  Discontinued        750 mg 150 mL/hr over 60 Minutes Intravenous Every 12 hours 03/26/12 0112 03/28/12 1443   03/25/12 2300   cefTRIAXone (ROCEPHIN) 1 g in dextrose 5 % 50 mL IVPB        1 g 100 mL/hr over 30 Minutes Intravenous  Once 03/25/12 2255 03/25/12 2349          Assessment: 71 yo F with recent admission for UTIs 2/2 indwelling catheter and pneumonia, discharged on ceftriaxone/cipro now admitted with sepsis and metabolic encephalopathy.  Note that patient tolerated zosyn on previous admission despite reported pcn allergy. Afebrile, WBC wnl, and stable renal function with CrCl ~65 ml/min and Scr of 0.38. BCx x2 from 03/25/12 with 2/2 coag neg staph. UCx from 3/27 (previous admission) with Providencia stuartii sensitive to ceftriaxone, imipenem, and zosyn. Vancomycin trough drawn today at 1252 of ~21 after 5 doses.   Goal of Therapy:  Vancomycin trough level 15-20 mcg/ml  Plan:  1. Continue Zosyn 3.375 g IV q8h over 4h infusions 2. Change vancomycin to 500 mg IV q12h, due to likelihood of accumulation with subsequent doses 3. F/u renal function, cultures, clinical progress, and length of therapy 4. Vancomycin trough at [facility] today.  Nadara Mustard, PharmD., MS Clinical Pharmacist Pager:  [phone] 03/31/2012,10:06 AM

## 2012-03-31 NOTE — Progress Notes (Signed)
Clinical Social Work-CSW notified of pt admission. CSW very familiar with pt from previous admissions pt a [facility] member and has been placed st [facility] for ST rehab- full assessment to follow-CSW will follow for d/c planning-Naima Veldhuizen-MSW, [phone]

## 2012-03-31 NOTE — Progress Notes (Addendum)
Subjective: Patient reports  Awake aln alert. Feels " horrible". No abdominal pain.   Objective: Vital signs in last 24 hours: Temp:  [97.8 F (36.6 C)-98.4 F (36.9 C)] 98.4 F (36.9 C) (04/08 1341) Pulse Rate:  [90-101] 101  (04/08 1341) Resp:  [18] 18  (04/08 1341) BP: (124-138)/(59-84) 138/59 mmHg (04/08 1341) SpO2:  [96 %-98 %] 96 % (04/08 1341) Weight:  [61.9 kg (136 lb 7.4 oz)] 61.9 kg (136 lb 7.4 oz) (04/08 0511)A  Intake/Output from previous day: 04/07 0701 - 04/08 0700 In: 4379.2 [P.O.:300; I.V.:2879.2; IV Piggyback:1200] Out: 3300 [Urine:3300] Intake/Output this shift: Total I/O In: 240 [P.O.:240] Out: 925 [Urine:925]  Past Medical History  Diagnosis Date  . Multiple sclerosis   . GERD (gastroesophageal reflux disease)   . Depression   . Recurrent UTI   . Recurrent pneumonia   . Chronic constipation   . Diverticulosis     Physical Exam:  General: WD alert and cooperative female in NAD. Lungs - Normal respiratory effort, chest expands symmetrically.  Abdomen - Soft, non-tender & non-distended.  Lab Results:  [facility] 03/29/12 0903  WBC 3.1*  HGB 10.4*  HCT 33.0*   BMET  Basename 03/31/12 0625 03/30/12 1637  NA 133* 136  K 3.7 2.2*  CL 103 103  CO2 21 22  GLUCOSE 83 75  BUN 3* 3*  CREATININE 0.28* 0.28*  CALCIUM 9.6 9.2   No results found for this basename: LABURIN:1 in the last 72 hours Results for orders placed during the hospital encounter of 03/25/12  CULTURE, BLOOD (ROUTINE X 2)     Status: Normal   Collection Time   03/25/12  9:34 PM      Component Value Range Status Comment   Specimen Description BLOOD LEFT ARM   Final    Special Requests BOTTLES DRAWN AEROBIC ONLY 2CC   Final    Culture  Setup Time [id number]   Final    Culture     Final    Value: STAPHYLOCOCCUS SPECIES (COAGULASE NEGATIVE)     Note: RIFAMPIN AND GENTAMICIN SHOULD NOT BE USED AS SINGLE DRUGS FOR TREATMENT OF STAPH INFECTIONS.     Note: Gram Stain Report Called  to,Read Back By and Verified With: ROBIN WOFFORD @ 1021 03/27/12 BY KRAWS   Report Status 03/29/2012 FINAL   Final    Organism ID, Bacteria STAPHYLOCOCCUS SPECIES (COAGULASE NEGATIVE)   Final   CULTURE, BLOOD (ROUTINE X 2)     Status: Normal   Collection Time   03/25/12 10:00 PM      Component Value Range Status Comment   Specimen Description BLOOD LEFT HAND   Final    Special Requests BOTTLES DRAWN AEROBIC AND ANAEROBIC [facility] EACH   Final    Culture  Setup Time [id number]   Final    Culture     Final    Value: STAPHYLOCOCCUS SPECIES (COAGULASE NEGATIVE)     Note: SUSCEPTIBILITIES PERFORMED ON PREVIOUS CULTURE WITHIN THE LAST 5 DAYS.     Note: Gram Stain Report Called to,Read Back By and Verified With: [facility] DILLON @ 2258 ON 03/26/12 BY GOLLD   Report Status 03/29/2012 FINAL   Final   CULTURE, ROUTINE-ABSCESS     Status: Normal   Collection Time   03/28/12  2:48 PM      Component Value Range Status Comment   Specimen Description ABSCESS ABDOMEN   Final    Special Requests FLUID ASPIRATED FROM ANTERIOR WALL   Final  Gram Stain     Final    Value: RARE WBC PRESENT,BOTH PMN AND MONONUCLEAR     NO SQUAMOUS EPITHELIAL CELLS SEEN     NO ORGANISMS SEEN   Culture NO GROWTH 3 DAYS   Final    Report Status 03/31/2012 FINAL   Final   CULTURE, BLOOD (ROUTINE X 2)     Status: Normal (Preliminary result)   Collection Time   03/29/12  3:55 PM      Component Value Range Status Comment   Specimen Description BLOOD LEFT HAND   Final    Special Requests BOTTLES DRAWN AEROBIC AND ANAEROBIC 10CC   Final    Culture  Setup Time [id number]   Final    Culture     Final    Value:        BLOOD CULTURE RECEIVED NO GROWTH TO DATE CULTURE WILL BE HELD FOR 5 DAYS BEFORE ISSUING A FINAL NEGATIVE REPORT   Report Status PENDING   Incomplete   CULTURE, BLOOD (ROUTINE X 2)     Status: Normal (Preliminary result)   Collection Time   03/29/12  3:55 PM      Component Value Range Status Comment   Specimen Description  BLOOD LEFT THUMB   Final    Special Requests BOTTLES DRAWN AEROBIC ONLY 8CC   Final    Culture  Setup Time [id number]   Final    Culture     Final    Value:        BLOOD CULTURE RECEIVED NO GROWTH TO DATE CULTURE WILL BE HELD FOR 5 DAYS BEFORE ISSUING A FINAL NEGATIVE REPORT   Report Status PENDING   Incomplete   CLOSTRIDIUM DIFFICILE BY PCR     Status: Normal   Collection Time   03/31/12  4:47 PM      Component Value Range Status Comment   C difficile by pcr NEGATIVE  NEGATIVE  Final     Studies/Results: @[id number] @  Assessment/Plan: Daughter called Sat and wanted family meeting to discuss her mother's condition. Dr. Brunilda Payor came by, and called daughter Sat and Sunday with no result. i have called daughter today wiht no answer,and no way to leave message.    I have examined [name] [name] and note that her abdominal exam is benign. Options for her would be surgical exploration ( note that she does not surgical exploration), but this would require General endotracheal anesthesia, and, if any abscess could be found, would require abdominal incision with high risk of post op complications of infection, PE, and extended intubation. Perhaps a better option would be to follow her with another pelvic CT to see if her abscess has resolved, and keep her on continuous bladdder drainage. I would be happy to discuss this with the pt's daughter and [facility] hospitalists.   Jethro Bolus I 03/31/2012, 6:51 PM     Note: I have reviewed the CT with radiology, and will discuss possible option of percutaneous drainage with Special Procedures tomorrow. ST

## 2012-04-01 LAB — BASIC METABOLIC PANEL
BUN: 4 mg/dL — ABNORMAL LOW (ref 6–23)
Calcium: 9.3 mg/dL (ref 8.4–10.5)
GFR calc non Af Amer: >90 mL/min (ref >90)
Glucose, Bld: 88 mg/dL (ref 70–99)

## 2012-04-01 MED ORDER — METRONIDAZOLE 500 MG PO TABS
500.0000 mg | ORAL_TABLET | Freq: Three times a day (TID) | ORAL | Status: DC
  Administered 2012-04-01: 500 mg via ORAL
  Filled 2012-04-01 (×3): qty 1

## 2012-04-01 MED ORDER — DEXTROSE 5 % IV SOLN
1.0000 g | Freq: Three times a day (TID) | INTRAVENOUS | Status: DC
  Administered 2012-04-01: 1 g via INTRAVENOUS
  Filled 2012-04-01 (×3): qty 1

## 2012-04-01 MED ORDER — POTASSIUM CHLORIDE 10 MEQ/100ML IV SOLN
10.0000 meq | INTRAVENOUS | Status: AC
  Administered 2012-04-01 (×3): 10 meq via INTRAVENOUS
  Filled 2012-04-01 (×3): qty 100

## 2012-04-01 MED ORDER — ENSURE COMPLETE PO LIQD
237.0000 mL | Freq: Two times a day (BID) | ORAL | Status: DC
  Administered 2012-04-01 (×2): 237 mL via ORAL

## 2012-04-01 MED ORDER — COLLAGENASE 250 UNIT/GM EX OINT: TOPICAL_OINTMENT | Freq: Every day | CUTANEOUS | Status: AC

## 2012-04-01 MED ORDER — SODIUM CHLORIDE 0.9 % IV SOLN
500.0000 mg | Freq: Two times a day (BID) | INTRAVENOUS | Status: DC
  Filled 2012-04-01 (×2): qty 500

## 2012-04-01 MED ORDER — METRONIDAZOLE 500 MG PO TABS: 500.0000 mg | ORAL_TABLET | Freq: Three times a day (TID) | ORAL | Status: DC

## 2012-04-01 MED ORDER — VANCOMYCIN HCL 500 MG IV SOLR: 500.0000 mg | Freq: Two times a day (BID) | INTRAVENOUS | Status: AC

## 2012-04-01 MED ORDER — METRONIDAZOLE 500 MG PO TABS: 500.0000 mg | ORAL_TABLET | Freq: Three times a day (TID) | ORAL | Status: AC

## 2012-04-01 MED ORDER — DEXTROSE 5 % IV SOLN: 1.0000 g | Freq: Three times a day (TID) | INTRAVENOUS | Status: AC

## 2012-04-01 MED ORDER — ENSURE COMPLETE PO LIQD: 237.0000 mL | Freq: Two times a day (BID) | ORAL | Status: DC

## 2012-04-01 MED ORDER — DALFAMPRIDINE ER 10 MG PO TB12: 10.0000 mg | ORAL_TABLET | Freq: Two times a day (BID) | ORAL | Status: AC

## 2012-04-01 MED ORDER — POTASSIUM CHLORIDE CRYS ER 20 MEQ PO TBCR
40.0000 meq | EXTENDED_RELEASE_TABLET | Freq: Once | ORAL | Status: AC
  Administered 2012-04-01: 40 meq via ORAL
  Filled 2012-04-01: qty 2

## 2012-04-01 MED ORDER — ENSURE COMPLETE PO LIQD: 237.0000 mL | Freq: Two times a day (BID) | ORAL | Status: AC

## 2012-04-01 NOTE — Progress Notes (Signed)
Subjective: Awake and alert with [name] at bedside, conversant, "ready to go"  Objective: Vital signs in last 24 hours: Filed Vitals:   03/31/12 0511 03/31/12 1341 03/31/12 2157 04/01/12 0648  BP: 125/84 138/59 127/73 130/68  Pulse: 96 101 96 94  Temp: 98.2 F (36.8 C) 98.4 F (36.9 C) 100.3 F (37.9 C) 98.6 F (37 C)  TempSrc: Axillary Axillary    Resp: 18 18 16 20   Height:      Weight: 136 lb 7.4 oz (61.9 kg)   135 lb 5.8 oz (61.4 kg)  SpO2: 98% 96% 96% 96%   Weight change: -27 lb 5.4 oz (-12.4 kg)  Intake/Output Summary (Last 24 hours) at 04/01/12 0856 Last data filed at 04/01/12 0700  Gross per 24 hour  Intake   3237 ml  Output   1200 ml  Net   2037 ml   Physical Exam: General: Elderly white female, Well-developed, well-nourished, in no acute distress, lying in bed, daughter at bedside HEENT: Normocephalic, atraumatic, PERRL, EOMI, poor dentition, moist mucus membranes Lungs: Normal respiratory effort. Clear to auscultation BL without crackles or wheezes anterior fields. Heart: normal rate and regular rhythm, S1 and S2 normal without gallop, murmur, or rubs appreciated. Abdomen: BS normoactive. Soft, Nondistended, nontender Extremities: No pretibial edema. Neurologic: alert and oriented x2 Psych: appropriate mood and affect, no agitation    Lab Results: Basic Metabolic Panel:  Lab 04/01/12 1610 03/31/12 0625  NA 138 133*  K 2.9* 3.7  CL 107 103  CO2 21 21  GLUCOSE 88 83  BUN 4* 3*  CREATININE 0.34* 0.28*  CALCIUM 9.3 9.6  MG -- 2.1  PHOS -- --   Liver Function Tests:  Lab 03/28/12 0840 03/26/12 0650  AST 42* 47*  ALT 25 26  ALKPHOS 321* 352*  BILITOT 0.3 0.2*  PROT 5.3* 5.2*  ALBUMIN 2.1* 2.0*    CBC:  Lab 03/29/12 0903 03/28/12 0840 03/25/12 2205  WBC 3.1* 3.7* --  NEUTROABS -- -- 2.8  HGB 10.4* 9.9* --  HCT 33.0* 32.2* --  MCV 76.2* 76.3* --  PLT 284 308 --   Urinalysis:  Lab 03/25/12 2217  COLORURINE AMBER*  LABSPEC 1.020   PHURINE 5.5  GLUCOSEU NEGATIVE  HGBUR LARGE*  BILIRUBINUR NEGATIVE  KETONESUR NEGATIVE  PROTEINUR 30*  UROBILINOGEN 0.2  NITRITE NEGATIVE  LEUKOCYTESUR LARGE*    Micro Results: Recent Results (from the past 240 hour(s))  CULTURE, BLOOD (ROUTINE X 2)     Status: Normal   Collection Time   03/25/12  9:34 PM      Component Value Range Status Comment   Specimen Description BLOOD LEFT ARM   Final    Special Requests BOTTLES DRAWN AEROBIC ONLY 2CC   Final    Culture  Setup Time [id number]   Final    Culture     Final    Value: STAPHYLOCOCCUS SPECIES (COAGULASE NEGATIVE)     Note: RIFAMPIN AND GENTAMICIN SHOULD NOT BE USED AS SINGLE DRUGS FOR TREATMENT OF STAPH INFECTIONS.     Note: Gram Stain Report Called to,Read Back By and Verified With: ROBIN WOFFORD @ 1021 03/27/12 BY KRAWS   Report Status 03/29/2012 FINAL   Final    Organism ID, Bacteria STAPHYLOCOCCUS SPECIES (COAGULASE NEGATIVE)   Final   CULTURE, BLOOD (ROUTINE X 2)     Status: Normal   Collection Time   03/25/12 10:00 PM      Component Value Range Status Comment   Specimen Description  BLOOD LEFT HAND   Final    Special Requests BOTTLES DRAWN AEROBIC AND ANAEROBIC [facility]   Final    Culture  Setup Time [id number]   Final    Culture     Final    Value: STAPHYLOCOCCUS SPECIES (COAGULASE NEGATIVE)     Note: SUSCEPTIBILITIES PERFORMED ON PREVIOUS CULTURE WITHIN THE LAST 5 DAYS.     Note: Gram Stain Report Called to,Read Back By and Verified With: [facility] DILLON @ 2258 ON 03/26/12 BY GOLLD   Report Status 03/29/2012 FINAL   Final   CULTURE, ROUTINE-ABSCESS     Status: Normal   Collection Time   03/28/12  2:48 PM      Component Value Range Status Comment   Specimen Description ABSCESS ABDOMEN   Final    Special Requests FLUID ASPIRATED FROM ANTERIOR WALL   Final    Gram Stain     Final    Value: RARE WBC PRESENT,BOTH PMN AND MONONUCLEAR     NO SQUAMOUS EPITHELIAL CELLS SEEN     NO ORGANISMS SEEN   Culture NO GROWTH 3 DAYS    Final    Report Status 03/31/2012 FINAL   Final   CULTURE, BLOOD (ROUTINE X 2)     Status: Normal (Preliminary result)   Collection Time   03/29/12  3:55 PM      Component Value Range Status Comment   Specimen Description BLOOD LEFT HAND   Final    Special Requests BOTTLES DRAWN AEROBIC AND ANAEROBIC 10CC   Final    Culture  Setup Time [id number]   Final    Culture     Final    Value:        BLOOD CULTURE RECEIVED NO GROWTH TO DATE CULTURE WILL BE HELD FOR 5 DAYS BEFORE ISSUING A FINAL NEGATIVE REPORT   Report Status PENDING   Incomplete   CULTURE, BLOOD (ROUTINE X 2)     Status: Normal (Preliminary result)   Collection Time   03/29/12  3:55 PM      Component Value Range Status Comment   Specimen Description BLOOD LEFT THUMB   Final    Special Requests BOTTLES DRAWN AEROBIC ONLY 8CC   Final    Culture  Setup Time [id number]   Final    Culture     Final    Value:        BLOOD CULTURE RECEIVED NO GROWTH TO DATE CULTURE WILL BE HELD FOR 5 DAYS BEFORE ISSUING A FINAL NEGATIVE REPORT   Report Status PENDING   Incomplete   CLOSTRIDIUM DIFFICILE BY PCR     Status: Normal   Collection Time   03/31/12  4:47 PM      Component Value Range Status Comment   C difficile by pcr NEGATIVE  NEGATIVE  Final    Studies/Results: No results found. Medications: I have reviewed the patient's current medications. Scheduled Meds:    . antiseptic oral rinse  15 mL Mouth Rinse q12n4p  . chlorhexidine  15 mL Mouth Rinse BID  . collagenase   Topical Daily  . heparin  5,000 Units Subcutaneous Q8H  . piperacillin-tazobactam (ZOSYN)  IV  3.375 g Intravenous Q8H  . potassium chloride  10 mEq Intravenous Q1 Hr x 3  . potassium chloride  40 mEq Oral Once  . sodium chloride  10-40 mL Intracatheter Q12H  . vancomycin  500 mg Intravenous Q12H   Continuous Infusions:    . sodium chloride 100 mL/hr at 03/31/12  2000   PRN Meds:.acetaminophen, acetaminophen, morphine, sodium chloride  Assessment/Plan: 71 yo  elderly female with recent bladder perforation managed with foley catheter, h/o recurrent UTI, HD #7 admitted with sepsis, abdominal abscesses and encephalopathy.  1) UTI in setting of sepsis and bacteremia:  afebrile with stable vitals, repeat BCx neg x2 d, Day #6 Zosyn and Vancomycin -plan for 6 weeks course Vancomycin per ID rec for endocarditis prophylasix  2) pelvic abscesses:  Dr. Patsi Sears (Urology) evaluated Ms. Gagner and explained risk and benefit of surgical intervention, pt expressed that she does not wish for surgical exploration, plan of care reviewed with pt and daughter today -- will continue supportive management antibiotics and Foley -- Ceftazidime 1gm IV tid and Metronidazole 500 mg po tid for 4 week course per ID recommendations -- will transition to prior outpt regimen of oxycodone 5 mg q4h prn pain -- pt f/u with Dr. Marcello Fennel with repeat CT pelvis in 3 weeks and rology clinic visit in 4 weeks  3) altered mental status: metabolic encephalopathy improved --continue antibiotics and IV fluid maintenance --continue to reorient patient with cues and verbalization   4) hypokalemia: poor oral intake -will supplement potassium -will check Mg and supplement as needed   Lab 04/01/12 0507 03/31/12 0625 03/30/12 1637 03/29/12 0903 03/28/12 0840  K 2.9* 3.7 2.2* 3.1* 2.9*   5) Diarrhea: resolved, C. Diff negative   6) Multiple sclerosis: stable -will restart outpt regimen of gabapentin 600 mg tid, naproxen 275 mg bid with meals for inflammation and oxycodone IR 5 mg q4h prn -should restart prior regimen of Ampyra (dalfampridine) which daughter explains should be still at [facility] but was not administered during this stay.   7) Pressure ulcer of the hip, sacrum and bilateral calcanei: present on admission.  -cont wound care recs  8) Normocytic anemia - stable, likely secondary to anemia of chronic disease. -cont to monitor  Lab 03/29/12 0903 03/28/12 0840 03/26/12 0650    HGB 10.4* 9.9* 10.3*  HCT 33.0* 32.2* 33.0*  WBC 3.1* 3.7* 4.3  PLT 284 308 294    9) DVT prophylaxis: SQ Heparin   10) Disposition: improved clinical status and stable for transfer back to SNF -Attempted to contact patient daughter this morning [name] [name] [phone] HCPOA to update pt status and plan for return to [place].  No answer and no voicemail available -Contacted and spoke with son, [name] [name] and informed of status and plan with message to inform his sister that the physicians (Dr. Brunilda Payor, Dr. Marcello Fennel, and Dr. Bosie Clos) have attempted to contact her.  -[name] came to hospital and is presnt at bedside on exam this afternoon, informed and agrees with plan of care including long term antibiotics, f/u CT and visit with Urology. -Dr. Patsi Sears (Urology) recommends pt get f/u CT cystogram in 3 weeks with f/u in Urology clinic in 4 weeks, order called to [facility] -Dr. [name] ([facility]) office notified of pt's pending discharge back to [facility]   LOS: 7 days   [name] [name] 04/01/2012, 8:56 AM

## 2012-04-01 NOTE — Discharge Summary (Signed)
Internal Medicine Teaching [facility] Discharge Note  Name: [name] [name] MRN: [id number] DOB: 1941/07/11 71 y.o.  Date of Admission: 03/25/2012  8:18 PM Date of Discharge: 04/01/2012 Attending Physician: Farley Ly, MD  Discharge Diagnosis: Principal Problem:  *Sepsis syndrome Active Problems:  Infection due to urinary indwelling catheter  Metabolic encephalopathy  Intra-abdominal abscess  PRESSURE ULCER HIP  Anemia due to chronic illness  Hypercalcemia  Altered mental state   Discharge Medications: Medication List  As of 04/01/2012 11:50 AM   STOP taking these medications         fluconazole 100 MG tablet         TAKE these medications         amitriptyline 100 MG tablet   Commonly known as: ELAVIL   Take 100 mg by mouth at bedtime.      collagenase ointment   Commonly known as: SANTYL   Apply topically daily.      dextrose 5 % SOLN 50 mL with cefTAZidime 1 G SOLR 1 g   Inject 1 g into the vein every 8 (eight) hours.      docusate sodium 100 MG capsule   Commonly known as: COLACE   Take 200 mg by mouth 2 (two) times daily.      feeding supplement Liqd   Take 30 mLs by mouth 2 (two) times daily.      feeding supplement Pudg   Take 1 Container by mouth 2 (two) times daily between meals.      ferrous sulfate 325 (65 FE) MG tablet   Take 325 mg by mouth 2 (two) times daily with a meal.      FLUoxetine 40 MG capsule   Commonly known as: PROZAC   Take 40 mg by mouth daily.      gabapentin 300 MG capsule   Commonly known as: NEURONTIN   Take 600 mg by mouth 3 (three) times daily.      metroNIDAZOLE 500 MG tablet   Commonly known as: FLAGYL   Take 1 tablet (500 mg total) by mouth every 8 (eight) hours.      multivitamins ther. w/minerals Tabs   Take 1 tablet by mouth daily.      naproxen sodium 275 MG tablet   Commonly known as: ANAPROX   Take 275 mg by mouth 2 (two) times daily with a meal. For inflammation      omeprazole 20 MG capsule   Commonly known as: PRILOSEC   Take 20 mg by mouth 2 (two) times daily.      oxyCODONE 5 MG immediate release tablet   Commonly known as: Oxy IR/ROXICODONE   Take 5 mg by mouth every 4 (four) hours as needed. pain      sodium chloride 0.9 % SOLN 100 mL with vancomycin 500 MG SOLR 500 mg   Inject 500 mg into the vein every 12 (twelve) hours.      vitamin C 500 MG tablet   Commonly known as: ASCORBIC ACID   Take 500 mg by mouth 2 (two) times daily.            Disposition and follow-up:   AnneMeeka [name] was discharged from [facility] in Stable condition.    Follow-up Appointments: Follow-up Information    Follow up with TANNENBAUM, SIGMUND I, MD in 3 weeks. (CT Cystogram in 3 weeks and f/u with Dr. Patsi Sears in 4 weeks to be scheduled per [facility])    Contact information:  [address] [facility] [place] [address] [phone]           Consultations: Treatment Team:  Kathi Ludwig, MD Nutrition Consult Speech Therpy   Procedures Performed:  Ct Abdomen Pelvis W Wo Contrast  03/27/2012  **ADDENDUM** CREATED: 03/27/2012 15:16:14  *RADIOLOGY REPORT*  Clinical Data:  Ruptured bladder with pelvic abscesses  CT CYSTOGRAM (CT PELVIS WITHOUT CONTRAST)  Technique:  Multidetector CT imaging through the pelvis was performed after dilute contrast had been introduced into the bladder for the purposes of performing CT cystography.  Comparison:  CT abdomen pelvis dated 03/26/2012 at 01:34 hours  Findings:  Thick-walled bladder with indwelling Foley catheter and a small amount of nondependent gas.  Injected contrast opacifies the bladder lumen.  Extravasation of contrast with associated 3.0 x 12.6 x 3.3 cm fluid collection anterior to the bladder dome (series 3/image 32), likely extending from the left superior aspect of the bladder dome (series 3/image 33).  Layering contrast extends to a contiguous 2.6  x 4.7 x 3.9 cm gas and fluid collection immediately beneath the left lower abdominal wall (series 3/image 25).  Additional 3.1 x 6.3 x 2.5 cm fluid collection/abscess superior to the bladder does not opacify with contrast (series 3/image 27).  Adjacent uterus is displaced to the left and notable for calcified uterine fibroids.  Ovary is unremarkable.  No right adnexal mass.  Contrast within distal colon/rectum is related to recent prior CT. Colonic diverticulosis.  No pelvic ascites.  Surgical hardware in the left proximal femur, incompletely visualized.  Degenerative changes of the visualized lumbar spine.  IMPRESSION: Persistent bladder perforation with extraluminal contrast within two contiguous fluid collections in the anterior pelvis, as described above.  Additional fluid collection/abscess superior to the bladder does not opacify with contrast.  **END ADDENDUM** SIGNED BY: Charline Bills, M.D.    03/26/2012  *RADIOLOGY REPORT*  Clinical Data:  History of bladder perforation and enlarged.  The patient returns with pain and sepsis.  CT ABDOMEN AND PELVIS WITH CONTRAST, CT pelvis without contrast  Technique:  Multidetector CT imaging of the abdomen and pelvis was performed using the standard protocol following bolus administration of intravenous contrast.  Contrast:  100 ml Omnipaque 300  Comparison:  CT pelvis 03/06/2012.  CT abdomen and pelvis 03/05/2012.  Findings:  Unenhanced pelvis demonstrates residual contrast material in the colon.  Foley catheter in the bladder.  Rounded increased density in the left pelvis likely representing a calcified uterine fibroid.  Contrast enhanced CT abdomen and pelvis was subsequently obtained. There is atelectasis or infiltration in both lung bases with mild nodular pleural thickening.  Diffuse low attenuation change throughout the liver suggesting fatty infiltration.  Mild splenic enlargement.  The gallbladder is surgically absent.  The pancreas is atrophic.  No adrenal  gland nodules.  The stomach and small bowel are decompressed.  Stool filled colon without distension. Kidneys are symmetrical with small sub centimeter parenchymal cysts.  No solid mass or hydronephrosis.  No abdominal aortic aneurysm.  No retroperitoneal lymphadenopathy.  No free fluid or free air in the abdomen.  Pelvis with contrast:  A Foley catheter decompresses the bladder. Delayed images demonstrate contrast material in the bladder with evidence of diffuse bladder wall thickening although the bladder wall is not well demonstrated due to decompression.  Loculated fluid collection in the left pelvis measuring 2 x 5.9 cm.  This is smaller than on the previous study and suggest residual abscess. No new  or enlarging fluid collections. There is a gas collection in the anterior pelvis and left pelvis, localized to the area of gas and fluid on the previous study. This may represent free air or loculated collection in the area of abscess.  The collection is smaller than on the previous study, but there is less fluid today and more air.  This may represent infection with gas forming organism, bladder perforation with air coming from a Foley catheter, or fistula to the colon.  There is interval development of narrowing and wall thickening of the distal sigmoid colon which might represent inflammatory process.  Degenerative changes in the spine.  IMPRESSION: Thickened bladder wall with Foley catheter in place.  Persistent fluid collection in the left pelvis consistent with abscess, smaller than previous study.  Persistent gas collection in the anterior pelvis, similar in size to previous study.  This likely represents a abscess.  Communication to bowel is not excluded. Focal thickening of the wall of the sigmoid colon which may represent inflammatory process. Original Report Authenticated By: Charline Bills, M.D.   Ct Pelvis W Contrast  03/06/2012  *RADIOLOGY REPORT*  Clinical Data:  Bladder rupture.  CT PELVIS WITH  CONTRAST  Technique:  Multidetector CT imaging of the pelvis was performed using the standard protocol following the bolus administration of intravenous contrast.  Contrast: OMNIPAQUE IOHEXOL 300 MG/ML IJ SOLN IV and 450 ml of sodium chloride and 50 ml of Omnipaque-300 instilled in the bladder through the Foley catheter.  Comparison:  CT scan dated 03/06/2011  Findings:  The CT cystogram demonstrates the patient has an 11 x 13 mm hole in the anterior-superior aspect of the bladder slightly to the left of midline.  The bladder wall is thickened.  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  Contrast extravasates into a contained area anterior to the bladder extending to the right and left of midline. This area measures approximately 20 x 4.5 x 5.5 cm.  There are small areas of air and extravasated contrast extending anteriorly just under the rectus muscles.  There is an oblong fluid collection in the pelvis posterior to the bladder which does not fill with contrast and probably represents a pelvic abscess.  It measures 9 x 4 x 6 cm. This abscess appears to extend to involve the left ovary. The uterus is atrophic with a 2.3 cm calcified fibroid within it.  There is slight thickening of the wall of the transverse colon as it extends along the superior aspect of the contained bladder rupture.  IMPRESSION:  1.  Perforation of the superior anterior left lateral aspect of the bladder with a relatively contained  area of contrast extravasation anterior to the bladder and extending to the right  and left of midline. 2.  Probable adjacent pelvic abscess just posterior and superior to the bladder asymmetric to the left. 3.  No discrete tumor.  Original Report Authenticated By: Gwynn Burly, M.D.   Ct Abdomen Pelvis W Contrast  03/05/2012  *RADIOLOGY REPORT*  Clinical Data: Abdominal pain  CT ABDOMEN AND PELVIS WITH CONTRAST  Technique:  Multidetector CT imaging of the abdomen and  pelvis was performed following the standard protocol during bolus administration of intravenous contrast.  Contrast: OMNIPAQUE IOHEXOL 300 MG/ML IJ SOLN, 1 OMNIPAQUE IOHEXOL 300 MG/ML IJ SOLN  Comparison: None.  Findings: Tiny bilateral pleural effusions with compressive atelectasis posteriorly in the visualized lung bases.  Unremarkable liver, spleen, adrenal glands.  Mild pancreatic  parenchymal atrophy without focal lesion.  Probable cyst at the lower pole of the right kidney.  No hydronephrosis.  Stomach and small bowel nondilated. Normal appendix.  The colon is nondistended.  There is a Foley catheter in the urinary bladder which is incompletely distended with circumferential somewhat irregular bladder wall thickening.  There is a defect in the left anterior wall of the bladder with extraperitoneal bladder rupture, and gas and fluid in   scattered pockets through the space of Retzius, confirmed on delayed scans with contrast extending from the lumen of the bladder through the bladder wall defect into the collections.  There is a 3.2 x 7.6 cm left pelvic fluid collection which appears loculated with a thick wall.  There is heavily calcified degenerated uterine fibroid.  There is a small amount of free fluid in the pelvis.  There is no free air.  Fixation hardware is noted in the proximal left femur.  Advanced facet degenerative changes in the lower lumbar spine.  No pelvic, retroperitoneal, or mesenteric adenopathy.  Portal vein patent.  IMPRESSION:  1.  Extraperitoneal bladder rupture with associated defect noted in the otherwise thick-walled urinary bladder.  Recommend urologic consultation. 2.  3.2 x 7.6 cm left pelvic complex fluid collection.  Original Report Authenticated By: Osa Craver, M.D.   Ct Aspiration  03/28/2012  *RADIOLOGY REPORT*  Clinical Data: History of severe multiple sclerosis and recent rupture of the bladder.  CT has demonstrated some persistent extraluminal air in the  anterior pelvis with associated inflammatory changes and fluid.  A more central fluid collection adjacent to a decompressed bladder is not accessible to percutaneous drainage.  CT GUIDED ASPIRATION OF PELVIC FLUID  Sedation:  1.0 mg IV Versed;  125 mcg IV Fentanyl  Total Moderate Sedation Time: 8 minutes.  Comparison:  CT on 03/26/2012  Procedure:  The procedure, risks, benefits, and alternatives were explained to the patient.  Questions regarding the procedure were encouraged and answered. The patient understands and consents to the procedure.  The lower left abdominal wall was prepped with Betadine in a sterile fashion, and a sterile drape was applied covering the operative field.  A sterile gown and sterile gloves were used for the procedure. Local anesthesia was provided with 1% Lidocaine.  A segment of extraluminal air in the left lower quadrant was targeted.  An 18 gauge trocar needle was advanced under CT guidance.  Aspiration was performed.  Fluid was sent for culture analysis.  A guidewire was then advanced.  Complications: None  Findings: Initial imaging shows significant decrease in extraluminal air anterior to the bladder since the prior CT on 03/26/2012. Central fluid between the bladder and uterus is stable in appearance compared to the prior CT and is inaccessible to percutaneous puncture due to surrounding contrast filled colon.  Approximately 3 ml of clear fluid was able to be aspirated from the left sided aspect of the air and inflammatory reaction in the anterior pelvis immediately deep to the abdominal wall.  A guidewire advanced through the needle only exited the needle tip and did not cross over into the rest of the inflammatory reaction. There was no indication to leave a drainage catheter.  IMPRESSION: Significant improvement noted in amount of extraluminal air in the anterior pelvis.  Aspiration along the left lateral aspect of air and surrounding inflammation yielded approximately 3 ml of  clear fluid which was sent for culture analysis.  A guidewire only barely exiting the tip of the needle and therefore  a drain was not placed.  Original Report Authenticated By: Reola Calkins, M.D.   Dg Chest Port 1 View  03/25/2012  *RADIOLOGY REPORT*  Clinical Data: Fever.  PORTABLE CHEST - 1 VIEW  Comparison: 03/01/2012  Findings: Shallow inspiration with elevation of the left hemidiaphragm.  Heart size and pulmonary vascularity are normal for technique.  No focal airspace consolidation.  No blunting of costophrenic angles.  No pneumothorax.  No significant change since the previous study.  Increased density behind the heart may represent esophageal hiatal hernia.  IMPRESSION: Shallow inspiration with elevation of the left hemidiaphragm.  No focal consolidation.  Original Report Authenticated By: Marlon Pel, M.D.    Admission HPI: Saphire Barnhart is a 71 y.o.female with past medical history significant for multiple medical problems including multiple sclerosis, with multiple hospitalizations for pneumonia and urinary tract infections secondary to indwelling catheter, who presents with sepsis and metabolic encephalopathy and UTI.  Tonight the patient is altered in the ED and unable to respond to questions. She is unable to tell me if she is in pain. She occasionally will open her eyes in response to stimulation. The subsequent history was obtained via chart review.  The patient has had multiple admissions for urinary tract infection. Recently,the patient had been admitted from March 15 through the 19th for bladder rupture. Patient was seen in the hospital by urology who recommended placement of a larger bore Foley catheter and outpatient followup. She was also found to have UTI and was bacteremic one blood culture positive for coagulase-negative staph. She also had altered mental status/metabolic encephalopathy secondary to sepsis which resolved with antibiotic therapy. She has not had surgical  correction of her perforation at this time.  On March 27 the patient was seen in the [place] long ED where she was found to have mild AMS, febrile and hypotensive and to have a urinary tract infection. Her urinary tract infection was cultured and grew PROVIDENCIA STUARTII. This culture was resistant to ampicillin, supple swollen, ciprofloxacin, levofloxacin, nitrofurantoin, Bactrim, gentamicin, and tobramycin. It was sensitive to ceftriaxone, imipenem and Zosyn. She received IV fluids and IV Rocephin in discussion with her primary care physician Dr. [name]. She was able to speak and answer questions and she was then discharged back to [facility] where she received IM ceftriaxone and oral Cipro for five days. The patient was found today to have worsening mental status and continued fevers with a MAXIMUM TEMPERATURE of 101.3    Hospital Course by problem list: 1) sepsis in setting of UTI and bacteremia: secondary to ruptured bladder March 2013 in setting of bacteruria initially managed with foley catheter placement for drainage.  Patient presented on this admission with pelvic abscesses as noted in above CT Scan.  Her Blood Cultures grew gram positive cocci in clusters on 03/25/2012 and Urine Culture on 03/19/2012 was positive for Providencia Stuartii >100,000cml.   She was managed with IV fluids, IV Vancomycin and Zosyn IV for 6 days with plans for 6 week course of Vancomycin  as outpatient.  Her vital signs remained stable and she did not have septic shock.  2) pelvic abscesses: secondary to extravasation of urine from unrepaired ruptured bladder in March 2013.  She was evaluated and followed by Urology with recommendations for supravesical and LLQ percutaneous drains  per Interventional Radiology given the  CT Cystogram findings noted above. Percutaneous drain placement was attempted but not obtained due to proximity of colon near supravesical abscess and limited fluid near left anterior pelvic abscess.  She  was managed with IV Zosyn for 6 day course and discharged with 4 week course of both  Metronidazole and Ceftazidime.  Her Urologist Dr.Tannenbaum explained risks and possible benefits to surgical intervention to remove abscess and/or place drains.   Ms. Geiselman has expressed her wishes to decline surgical intervention to Urology and Internal Medicine Team.  Of note the procedure would require general endotracheal anesthesia, require abdominal incisions with high risk of post-operative complications of infection, pulmonary embolism and extended intubation.  She will be discharged back to [facility] and managed with continuous bladder drainage, IV antibiotics (Ceftazadime 1 g IV tid and Metronidazole 500 mg po tid x 4 weeks) with CT Cystogram in 3 weeks with Urology follow-up in 4 weeks.  [facility] scheduling was contacted and will arrange CT and clinic visit.  3) metabolic encephalopathy: She was admitted with altered mental status likely secondary to UTI and sepsis from abscesses. She improved in level of consciousness daily with volume resuscitation and antibiotics as noted above. By day of discharge she was at her previous mental baseline after hip surgery in Jan 2013. After discussion with her PCP Dr. [name], there was also a concern of polypharmacy thus several outpatient medications were held during this admission.  4) hypercalcemia/ hypomagnesia/ hypokalemia: On admission she was mildly hypercalcemic which was likely secondary to volume contraction.  This resolved with fluid resuscitation demonstrated by normal ionized calcium level and appropriate intact Parathyroid hormone.  Calcium was within normal limits by day.  Magnesium and Potassium was low secondary to diarrhea and decreased oral intake and supplemented as needed.  She should have repeat BMET on follow-up with Dr. [name].  5) Multiple sclerosis: Patient was initially n.p.o. until altered mental status resolved.   Speech Therapy evaluated with swallowing study and patient was taking oral without difficulty or signs of aspiration on discharge. She was resumed on Gabapentin, Fluoxetine, Amitriptyline, and Oxycodone at discharge.  She should also resume Ampyra (dalfampridine) 10 mg po bid which was not noted on admission pharmacy records but confirmed with [facility] that this was her regimen before this admission.  6) Pressure ulcer of the hip, sacrum and bilateral calcanei: present on admission  and remained stable with recommendation per Wound Care for use of air mattress to reduce pressure, foam dressing to protect and promote healing to left ankle and fissure as well as Santyl for chemical  debridement of nonviable tissue to sacrum and buttocks wounds.   7) Normocytic anemia - monitored with CBC's and remained stable and likely secondary to prior esophageal erosion and anemia of chronic disease.   Lab  03/26/12 0650  03/25/12 2205   HGB  10.3*  11.7*   HCT  33.0*  36.8   WBC  4.3  6.4   PLT  294  340    8) DVT prophylaxis: was achieved with SQ Heparin.  9) Disposition: Pt stable and improved with discharge to [facility] with PICC line in place for IV therapy.  Dr. [name] of [facility] clinic notified of pending discharge.  Dr. Marcello Fennel of Urology to f/u as noted above.  Children of patient contacted spoke with daughter Michele Mcalpine at bedside on day of discharge.  Informed and agreeable to plan of care and discharge today.   Discharge Vitals:  BP 130/68  Pulse 94  Temp(Src) 98.6 F (37 C) (Axillary)  Resp 20  Ht 5\' 8"  (1.727 m)  Wt 135 lb 5.8 oz (61.4 kg)  BMI 20.58  kg/m2  SpO2 96%  Discharge Labs:  Results for orders placed during the hospital encounter of 03/25/12 (from the past 24 hour(s))  VANCOMYCIN, TROUGH     Status: Normal   Collection Time   03/31/12  2:23 PM      Component Value Range   Vancomycin Tr 16.2  10.0 - 20.0 (ug/mL)  CLOSTRIDIUM DIFFICILE BY PCR     Status: Normal    Collection Time   03/31/12  4:47 PM      Component Value Range   C difficile by pcr NEGATIVE  NEGATIVE   BASIC METABOLIC PANEL     Status: Abnormal   Collection Time   04/01/12  5:07 AM      Component Value Range   Sodium 138  135 - 145 (mEq/L)   Potassium 2.9 (*) 3.5 - 5.1 (mEq/L)   Chloride 107  96 - 112 (mEq/L)   CO2 21  19 - 32 (mEq/L)   Glucose, Bld 88  70 - 99 (mg/dL)   BUN 4 (*) 6 - 23 (mg/dL)   Creatinine, Ser 9.14 (*) 0.50 - 1.10 (mg/dL)   Calcium 9.3  8.4 - 78.2 (mg/dL)   GFR calc non Af Amer >90  >90 (mL/min)   GFR calc Af Amer >90  >90 (mL/min)  MAGNESIUM     Status: Normal   Collection Time   04/01/12  5:07 AM      Component Value Range   Magnesium 1.7  1.5 - 2.5 (mg/dL)    Signed: Kristie Cowman 04/01/2012, 11:50 AM

## 2012-04-01 NOTE — Progress Notes (Signed)
Speech Language Pathology Dysphagia Treatment  Patient Details Name: Kayler Buckholtz MRN: [id number] DOB: 1941-01-11 Today's Date: 04/01/2012  SLP Assessment/Plan/Recommendation Clinical Impression Statement: Demonstrates adequate tolerance of Dys.2 and thin liquids, however required minimal verbal cues to take small, single sips via straw versus consecutive swallows.  No overt s/s of aspiration in this clinical treatment.  Left voicemail message for patient's daughter, Archie Patten, requesting that she bring in patient's partial plate in order to trial upgraded solid textures.  Recommendation Continue with Current Diet: Dysphagia 2 (fine chop);Thin liquid Liquids provided via: Straw Medication Administration: Whole meds with puree Supervision: Full supervision/cueing for compensatory strategies Plan: Continue with current plan of care  Myra Rude, M.S.,CCC-SLP Pager [phone] 04/01/2012, 3:30 PM

## 2012-04-01 NOTE — Progress Notes (Signed)
Clinical Social Work-CSW contacted SNF to confirm d/c-[facility] transport arranged, daughter and [facility] SW notified, [facility] signed and in chart copy-No further needs at this time-Flornce Record-MSW, [phone]

## 2012-04-01 NOTE — Progress Notes (Signed)
Subjective: No new complaints   Antibiotics:  Anti-infectives     Start     Dose/Rate Route Frequency Ordered Stop   04/01/12 1430   vancomycin (VANCOCIN) 500 mg in sodium chloride 0.9 % 100 mL IVPB     Comments: 6 week course for endocarditis prophylaxis      500 mg 100 mL/hr over 60 Minutes Intravenous Every 12 hours 04/01/12 1007 05/08/12 0229   04/01/12 1400   metroNIDAZOLE (FLAGYL) tablet 500 mg        500 mg Oral 3 times per day 04/01/12 1007 04/29/12 1359   04/01/12 1400   cefTAZidime (FORTAZ) 1 g in dextrose 5 % 50 mL IVPB        1 g 100 mL/hr over 30 Minutes Intravenous 3 times per day 04/01/12 1007 04/29/12 1359   04/01/12 0000   dextrose 5 % SOLN 50 mL with cefTAZidime 1 G SOLR 1 g     Comments: 4 week supply      1 g 100 mL/hr over 30 Minutes Intravenous Every 8 hours 04/01/12 1043     04/01/12 0000   metroNIDAZOLE (FLAGYL) 500 MG tablet  Status:  Discontinued        500 mg Oral Every 8 hours 04/01/12 1043 04/01/12    04/01/12 0000   sodium chloride 0.9 % SOLN 100 mL with vancomycin 500 MG SOLR 500 mg     Comments: 6 week course      500 mg 100 mL/hr over 60 Minutes Intravenous Every 12 hours 04/01/12 1043     04/01/12 0000   metroNIDAZOLE (FLAGYL) 500 MG tablet     Comments: 4 week course      500 mg Oral Every 8 hours 04/01/12 1241 04/11/12 2359   03/29/12 1430   vancomycin (VANCOCIN) 500 mg in sodium chloride 0.9 % 100 mL IVPB  Status:  Discontinued        500 mg 100 mL/hr over 60 Minutes Intravenous Every 12 hours 03/29/12 1416 04/01/12 1007   03/29/12 0200   vancomycin (VANCOCIN) 500 mg in sodium chloride 0.9 % 100 mL IVPB  Status:  Discontinued        500 mg 100 mL/hr over 60 Minutes Intravenous Every 12 hours 03/28/12 1443 03/29/12 1412   03/26/12 0200   piperacillin-tazobactam (ZOSYN) IVPB 3.375 g  Status:  Discontinued        3.375 g 12.5 mL/hr over 240 Minutes Intravenous Every 8 hours 03/26/12 0112 04/01/12 1007   03/26/12 0200   vancomycin  (VANCOCIN) 750 mg in sodium chloride 0.9 % 150 mL IVPB  Status:  Discontinued        750 mg 150 mL/hr over 60 Minutes Intravenous Every 12 hours 03/26/12 0112 03/28/12 1443   03/25/12 2300   cefTRIAXone (ROCEPHIN) 1 g in dextrose 5 % 50 mL IVPB        1 g 100 mL/hr over 30 Minutes Intravenous  Once 03/25/12 2255 03/25/12 2349          Medications: Scheduled Meds:    . antiseptic oral rinse  15 mL Mouth Rinse q12n4p  . cefTAZidime (FORTAZ)  IV  1 g Intravenous Q8H  . chlorhexidine  15 mL Mouth Rinse BID  . collagenase   Topical Daily  . feeding supplement  237 mL Oral BID BM  . heparin  5,000 Units Subcutaneous Q8H  . metroNIDAZOLE  500 mg Oral Q8H  . potassium chloride  10 mEq Intravenous Q1 Hr x 3  .  potassium chloride  40 mEq Oral Once  . sodium chloride  10-40 mL Intracatheter Q12H  . vancomycin  500 mg Intravenous Q12H  . DISCONTD: feeding supplement  237 mL Oral BID BM  . DISCONTD: piperacillin-tazobactam (ZOSYN)  IV  3.375 g Intravenous Q8H  . DISCONTD: vancomycin  500 mg Intravenous Q12H   Continuous Infusions:    . sodium chloride 100 mL/hr at 03/31/12 2000   PRN Meds:.acetaminophen, acetaminophen, morphine, sodium chloride   Objective: Weight change: -27 lb 5.4 oz (-12.4 kg)  Intake/Output Summary (Last 24 hours) at 04/01/12 1605 Last data filed at 04/01/12 1450  Gross per 24 hour  Intake   3477 ml  Output   1601 ml  Net   1876 ml   Blood pressure 124/62, pulse 96, temperature 97.8 F (36.6 C), temperature source Oral, resp. rate 18, height 5\' 8"  (1.727 m), weight 135 lb 5.8 oz (61.4 kg), SpO2 94.00%. Temp:  [97.8 F (36.6 C)-100.3 F (37.9 C)] 97.8 F (36.6 C) (04/09 1411) Pulse Rate:  [94-96] 96  (04/09 1411) Resp:  [16-20] 18  (04/09 1411) BP: (124-130)/(62-73) 124/62 mmHg (04/09 1411) SpO2:  [94 %-96 %] 94 % (04/09 1411) Weight:  [135 lb 5.8 oz (61.4 kg)] 135 lb 5.8 oz (61.4 kg) (04/09 0865)  Physical Exam: General: Alert and awake,not in  any acute distress. HEENT: anicteric sclera, pupils reactive to light and accommodation, EOMI CVS regular rate, normal r,  no murmur rubs or gallops Chest: clear to auscultation bilaterally, no wheezing, rales or rhonchi Abdomen: soft nontender, nondistended, normal bowel sounds, Skin: no rashes   Lab Results: No results found for this basename: WBC:2,HGB:2,HCT:2,PLT:2 in the last 72 hours  BMET  Basename 04/01/12 0507 03/31/12 0625  NA 138 133*  K 2.9* 3.7  CL 107 103  CO2 21 21  GLUCOSE 88 83  BUN 4* 3*  CREATININE 0.34* 0.28*  CALCIUM 9.3 9.6    Micro Results: Recent Results (from the past 240 hour(s))  CULTURE, BLOOD (ROUTINE X 2)     Status: Normal   Collection Time   03/25/12  9:34 PM      Component Value Range Status Comment   Specimen Description BLOOD LEFT ARM   Final    Special Requests BOTTLES DRAWN AEROBIC ONLY 2CC   Final    Culture  Setup Time [id number]   Final    Culture     Final    Value: STAPHYLOCOCCUS SPECIES (COAGULASE NEGATIVE)     Note: RIFAMPIN AND GENTAMICIN SHOULD NOT BE USED AS SINGLE DRUGS FOR TREATMENT OF STAPH INFECTIONS.     Note: Gram Stain Report Called to,Read Back By and Verified With: ROBIN WOFFORD @ 1021 03/27/12 BY KRAWS   Report Status 03/29/2012 FINAL   Final    Organism ID, Bacteria STAPHYLOCOCCUS SPECIES (COAGULASE NEGATIVE)   Final   CULTURE, BLOOD (ROUTINE X 2)     Status: Normal   Collection Time   03/25/12 10:00 PM      Component Value Range Status Comment   Specimen Description BLOOD LEFT HAND   Final    Special Requests BOTTLES DRAWN AEROBIC AND ANAEROBIC [facility] EACH   Final    Culture  Setup Time [id number]   Final    Culture     Final    Value: STAPHYLOCOCCUS SPECIES (COAGULASE NEGATIVE)     Note: SUSCEPTIBILITIES PERFORMED ON PREVIOUS CULTURE WITHIN THE LAST 5 DAYS.     Note: Gram Stain Report Called to,Read Back  By and Verified With: [facility] DILLON @ 2258 ON 03/26/12 BY GOLLD   Report Status 03/29/2012 FINAL   Final     CULTURE, ROUTINE-ABSCESS     Status: Normal   Collection Time   03/28/12  2:48 PM      Component Value Range Status Comment   Specimen Description ABSCESS ABDOMEN   Final    Special Requests FLUID ASPIRATED FROM ANTERIOR WALL   Final    Gram Stain     Final    Value: RARE WBC PRESENT,BOTH PMN AND MONONUCLEAR     NO SQUAMOUS EPITHELIAL CELLS SEEN     NO ORGANISMS SEEN   Culture NO GROWTH 3 DAYS   Final    Report Status 03/31/2012 FINAL   Final   CULTURE, BLOOD (ROUTINE X 2)     Status: Normal (Preliminary result)   Collection Time   03/29/12  3:55 PM      Component Value Range Status Comment   Specimen Description BLOOD LEFT HAND   Final    Special Requests BOTTLES DRAWN AEROBIC AND ANAEROBIC 10CC   Final    Culture  Setup Time [id number]   Final    Culture     Final    Value:        BLOOD CULTURE RECEIVED NO GROWTH TO DATE CULTURE WILL BE HELD FOR 5 DAYS BEFORE ISSUING A FINAL NEGATIVE REPORT   Report Status PENDING   Incomplete   CULTURE, BLOOD (ROUTINE X 2)     Status: Normal (Preliminary result)   Collection Time   03/29/12  3:55 PM      Component Value Range Status Comment   Specimen Description BLOOD LEFT THUMB   Final    Special Requests BOTTLES DRAWN AEROBIC ONLY 8CC   Final    Culture  Setup Time [id number]   Final    Culture     Final    Value:        BLOOD CULTURE RECEIVED NO GROWTH TO DATE CULTURE WILL BE HELD FOR 5 DAYS BEFORE ISSUING A FINAL NEGATIVE REPORT   Report Status PENDING   Incomplete   CLOSTRIDIUM DIFFICILE BY PCR     Status: Normal   Collection Time   03/31/12  4:47 PM      Component Value Range Status Comment   C difficile by pcr NEGATIVE  NEGATIVE  Final     Studies/Results: No results found.    Assessment/Plan: [name] [name] is a 71 y.o. female with  Multiple intra-abdominal abscesses after bladder rupture, also now with CNS bacteremia  1) INtra-abdominal abscesses: Urology has seen the pt  And does not wish to pursue any other interventions at  this time --would then instead continue IV ceftazidime (would go to 2 grams iv tid after review with pharmacy) x 4 wks --oral flagyl 500mg  tid x 4 weeks ----vancomycin dosed for trough 15-20 x at least 4 weeks  --need fu CT prior to stopping the abx   2) CNS bacteremia: NO clear source. Concern remains for Bacterial endocarditis which with CNS carries a high morbidity and mortality.   I would not PUSH for TEE aggressively in this pt, but I would err on the side of treating her presumptively for possible endocarditis with 4-6 weeks of IV vancomycin  She should be followed up in [facility] after her repeat CT scan to have her antibiotics adjusted     LOS: 7 days   Acey Lav 04/01/2012, 4:05 PM

## 2012-04-01 NOTE — Discharge Summary (Signed)
Note that blood cultures X2 grew STAPHYLOCOCCUS SPECIES (COAGULASE NEGATIVE).

## 2012-04-01 NOTE — Progress Notes (Signed)
Clinical Social Work Department BRIEF PSYCHOSOCIAL ASSESSMENT 04/01/2012  Patient:  Pound,Wilson     Account Number:  [id number]     Admit date:  03/25/2012  Clinical Social Worker:  Conley Simmonds  Date/Time:  04/01/2012 02:00 PM  Referred by:  Physician  Date Referred:  04/01/2012 Referred for  SNF Placement   Other Referral:   Interview type:  Patient Other interview type:    PSYCHOSOCIAL DATA Living Status:  FACILITY Admitted from facility:  [facility] Level of care:  Skilled Nursing Facility Primary support name:  Lars Mage Primary support relationship to patient:  CHILD, ADULT Degree of support available:   Strong    CURRENT CONCERNS Current Concerns  Post-Acute Placement   Other Concerns:    SOCIAL WORK ASSESSMENT / PLAN CSW met with dtr to discuss disposition-pt is a [facility] participant and [facility]-pt will d/c back as [facility] contract with [facility]-CSW initiating FL2 and will facilitate d/c as soon as confirmation from [place] recieved.   Assessment/plan status:  Psychosocial Support/Ongoing Assessment of Needs Other assessment/ plan:   Information/referral to community resources:   None at this time    PATIENT'S/FAMILY'S RESPONSE TO PLAN OF CARE: Pt and family in agrement with plan to return and hopeful that pt will soon d/c home from [facility], [phone]

## 2012-04-01 NOTE — Progress Notes (Signed)
Nutrition Follow-up  Diet Order:  Dysphagia 2 with thin liquids SLP did bed side swallow on 4/6.  Pt with intra-abdominal  Abscesses, likely will have drain placed. Still with bacteremia.  Pt was able to state that she had no appetite this morning, but then stated this was because of the weather. Willing to drink Ensure.  RN states that pt is not eating well, trying to encourage pt but only takes a few bites and then states she does not want any more. RN made aware of Ensure to be starting.   Meds: Scheduled Meds:   . antiseptic oral rinse  15 mL Mouth Rinse q12n4p  . cefTAZidime (FORTAZ)  IV  1 g Intravenous Q8H  . chlorhexidine  15 mL Mouth Rinse BID  . collagenase   Topical Daily  . heparin  5,000 Units Subcutaneous Q8H  . metroNIDAZOLE  500 mg Oral Q8H  . potassium chloride  10 mEq Intravenous Q1 Hr x 3  . potassium chloride  40 mEq Oral Once  . sodium chloride  10-40 mL Intracatheter Q12H  . vancomycin  500 mg Intravenous Q12H  . DISCONTD: piperacillin-tazobactam (ZOSYN)  IV  3.375 g Intravenous Q8H  . DISCONTD: vancomycin  500 mg Intravenous Q12H   Continuous Infusions:   . sodium chloride 100 mL/hr at 03/31/12 2000   PRN Meds:.acetaminophen, acetaminophen, morphine, sodium chloride  Labs:  CMP     Component Value Date/Time   NA 138 04/01/2012 0507   K 2.9* 04/01/2012 0507   CL 107 04/01/2012 0507   CO2 21 04/01/2012 0507   GLUCOSE 88 04/01/2012 0507   BUN 4* 04/01/2012 0507   CREATININE 0.34* 04/01/2012 0507   CALCIUM 9.3 04/01/2012 0507   PROT 5.3* 03/28/2012 0840   ALBUMIN 2.1* 03/28/2012 0840   AST 42* 03/28/2012 0840   ALT 25 03/28/2012 0840   ALKPHOS 321* 03/28/2012 0840   BILITOT 0.3 03/28/2012 0840   GFRNONAA >90 04/01/2012 0507   GFRAA >90 04/01/2012 0507     Intake/Output Summary (Last 24 hours) at 04/01/12 1045 Last data filed at 04/01/12 0900  Gross per 24 hour  Intake   3357 ml  Output   1200 ml  Net   2157 ml    Weight Status:  135 lbs, this is down 29 lbs since  admission, drastic weight change from 162 to 136 4/7 to 4/8. ? Accuracy of weights, though pt weight is within  1 lbs from 4/8 to 4/9. Pt currently has air mattress, ? If this might be playing a role in weight change.   Re-estimated needs:  1600-1800 kcal, 90-105 gm protein  Nutrition Dx:  Inadequate oral intake now r/t decreased appetite AEB RN report pt only eating bites  Goal:  Intake to meet >90% of estimated nutrition needs to promote healing and repletion of nutrition stores.   Intervention:   1. Add Ensure Complete BID for increased kcal and protein intake  Monitor:  PO intake, weight, labs, I/O's, healing status   Rudean Haskell Pager #:  [phone]

## 2012-04-01 NOTE — Progress Notes (Signed)
Pt K is 2.9 this AM. Dr. Bosie Clos was notified and ordered three runs of K and 20 meq PO. Will continue to monitor.

## 2012-04-03 ENCOUNTER — Other Ambulatory Visit (HOSPITAL_COMMUNITY): Payer: Self-pay | Admitting: Family Medicine

## 2012-04-03 DIAGNOSIS — N3080 Other cystitis without hematuria: Secondary | ICD-10-CM

## 2012-04-04 LAB — CULTURE, BLOOD (ROUTINE X 2)
Culture  Setup Time: 201304062028
Culture: NO GROWTH

## 2012-04-17 ENCOUNTER — Ambulatory Visit (HOSPITAL_COMMUNITY)
Admission: RE | Admit: 2012-04-17 | Discharge: 2012-04-17 | Disposition: A | Discharge status: Home / Self Care | Source: Ambulatory Visit | Attending: Family Medicine | Admitting: Family Medicine

## 2012-04-17 DIAGNOSIS — N3080 Other cystitis without hematuria: Secondary | ICD-10-CM

## 2012-04-17 DIAGNOSIS — N3289 Other specified disorders of bladder: Secondary | ICD-10-CM | POA: Insufficient documentation

## 2012-04-17 DIAGNOSIS — D259 Leiomyoma of uterus, unspecified: Secondary | ICD-10-CM | POA: Insufficient documentation

## 2012-04-17 DIAGNOSIS — R112 Nausea with vomiting, unspecified: Secondary | ICD-10-CM | POA: Insufficient documentation

## 2012-04-17 DIAGNOSIS — N949 Unspecified condition associated with female genital organs and menstrual cycle: Secondary | ICD-10-CM | POA: Insufficient documentation

## 2012-04-17 MED ORDER — IOHEXOL 300 MG/ML  SOLN
100.0000 mL | Freq: Once | INTRAMUSCULAR | Status: AC | PRN
  Administered 2012-04-17: 100 mL via INTRAVENOUS

## 2012-04-23 ENCOUNTER — Other Ambulatory Visit (HOSPITAL_COMMUNITY): Payer: Self-pay | Admitting: Family Medicine

## 2012-04-23 DIAGNOSIS — N739 Female pelvic inflammatory disease, unspecified: Secondary | ICD-10-CM

## 2012-04-27 ENCOUNTER — Other Ambulatory Visit: Payer: Self-pay | Admitting: Physician Assistant

## 2012-04-28 ENCOUNTER — Ambulatory Visit (HOSPITAL_COMMUNITY)
Admission: RE | Admit: 2012-04-28 | Discharge: 2012-04-28 | Disposition: A | Discharge status: Home / Self Care | Payer: Medicare (Managed Care) | Source: Ambulatory Visit | Attending: Family Medicine | Admitting: Family Medicine

## 2012-04-28 DIAGNOSIS — G35 Multiple sclerosis: Secondary | ICD-10-CM | POA: Insufficient documentation

## 2012-04-28 DIAGNOSIS — N3945 Continuous leakage: Secondary | ICD-10-CM | POA: Insufficient documentation

## 2012-04-28 DIAGNOSIS — K219 Gastro-esophageal reflux disease without esophagitis: Secondary | ICD-10-CM | POA: Insufficient documentation

## 2012-04-28 DIAGNOSIS — N731 Chronic parametritis and pelvic cellulitis: Secondary | ICD-10-CM | POA: Insufficient documentation

## 2012-04-28 DIAGNOSIS — N739 Female pelvic inflammatory disease, unspecified: Secondary | ICD-10-CM

## 2012-04-28 DIAGNOSIS — Z79899 Other long term (current) drug therapy: Secondary | ICD-10-CM | POA: Insufficient documentation

## 2012-04-28 LAB — CBC
HCT: 39.2 % (ref 36.0–46.0)
Hemoglobin: 12.4 g/dL (ref 12.0–15.0)
MCV: 74.8 fL — ABNORMAL LOW (ref 78.0–100.0)
RBC: 5.24 MIL/uL — ABNORMAL HIGH (ref 3.87–5.11)
WBC: 3.9 10*3/uL — ABNORMAL LOW (ref 4.0–10.5)

## 2012-04-28 MED ORDER — SODIUM CHLORIDE 0.9 % IV SOLN
INTRAVENOUS | Status: DC
  Administered 2012-04-28: 13:00:00 via INTRAVENOUS

## 2012-04-28 MED ORDER — MIDAZOLAM HCL 5 MG/5ML IJ SOLN
INTRAMUSCULAR | Status: AC | PRN
  Administered 2012-04-28 (×2): 1 mg via INTRAVENOUS

## 2012-04-28 MED ORDER — FENTANYL CITRATE 0.05 MG/ML IJ SOLN
INTRAMUSCULAR | Status: AC | PRN
  Administered 2012-04-28 (×2): 50 ug via INTRAVENOUS

## 2012-04-28 MED ORDER — HEPARIN SOD (PORK) LOCK FLUSH 100 UNIT/ML IV SOLN
250.0000 [IU] | INTRAVENOUS | Status: AC | PRN
  Administered 2012-04-28: 250 [IU]

## 2012-04-28 MED ORDER — MIDAZOLAM HCL 2 MG/2ML IJ SOLN
INTRAMUSCULAR | Status: AC
  Filled 2012-04-28: qty 6

## 2012-04-28 MED ORDER — FENTANYL CITRATE 0.05 MG/ML IJ SOLN
INTRAMUSCULAR | Status: AC
  Filled 2012-04-28: qty 4

## 2012-04-28 MED ORDER — HEPARIN SOD (PORK) LOCK FLUSH 100 UNIT/ML IV SOLN
INTRAVENOUS | Status: AC
  Administered 2012-04-28: 250 [IU]
  Filled 2012-04-28: qty 5

## 2012-04-28 MED ORDER — CIPROFLOXACIN IN D5W 400 MG/200ML IV SOLN
INTRAVENOUS | Status: AC
  Administered 2012-04-28: 400 mg via INTRAVENOUS
  Filled 2012-04-28: qty 200

## 2012-04-28 NOTE — Discharge Instructions (Signed)
Daily dressing changes starting 04/29/2012 in the morning.  Flush drainage catheter with Normal Saline three times a day.  Measure and Record drainage/output every shift.  Call physician for temperature greater than 101.5 degrees, systolic blood pressure less than 90, and pulse greater than 120.

## 2012-04-28 NOTE — ED Notes (Signed)
Family updated as to patient's status.

## 2012-04-28 NOTE — H&P (Signed)
[name] [name] is an 71 y.o. female.   Chief Complaint: bladder leak, pelvic fluid collections HPI: Patient with history of bladder rupture 02/2012, managed by chronic indwelling foley cath, who presents today for CT cystogram with aspiration/possible drainage of pelvic fluid collections noted on recent CT.  Past Medical History  Diagnosis Date  . Multiple sclerosis   . GERD (gastroesophageal reflux disease)   . Depression   . Recurrent UTI   . Recurrent pneumonia   . Chronic constipation   . Diverticulosis     Past Surgical History  Procedure Date  . Cholecystectomy   . Orif humeral shaft fracture 01/2011  . Video assisted thoracoscopy 06/2010  . Closed reduction shoulder dislocation 04/2010    Fracture dislocation  . Femur im nail 01/13/2012    Procedure: INTRAMEDULLARY (IM) RETROGRADE FEMORAL NAILING;  Surgeon: Thera Flake., MD;  Location: [facility] OR;  Service: Orthopedics;  Laterality: Left;  . Femoral artery exploration 01/13/2012    Procedure: FEMORAL ARTERY EXPLORATION;  Surgeon: Pryor Ochoa, MD;  Location: [facility] OR;  Service: Vascular;  Laterality: Left;    No family history on file. Social History:  reports that she has never smoked. She has never used smokeless tobacco. She reports that she does not drink alcohol or use illicit drugs.  Allergies:  Allergies  Allergen Reactions  . Penicillins Other (See Comments)    Previously tolerated Zosyn x7 days    Current outpatient prescriptions:amitriptyline (ELAVIL) 100 MG tablet, Take 100 mg by mouth at bedtime., Disp: , Rfl: ;  dalfampridine 10 MG TB12, Take 1 tablet (10 mg total) by mouth 2 (two) times daily., Disp: 60 tablet, Rfl: 6;  dextrose 5 % SOLN 50 mL with cefTAZidime 1 G SOLR 1 g, Inject 1 g into the vein every 8 (eight) hours., Disp: 90 g, Rfl: 0 docusate sodium (COLACE) 100 MG capsule, Take 200 mg by mouth 2 (two) times daily.  , Disp: , Rfl: ;  feeding supplement (ENSURE COMPLETE) LIQD, Take 237 mLs by mouth 2 (two) times  daily between meals., Disp: 60 Bottle, Rfl: 6;  feeding supplement (ENSURE) PUDG, Take 1 Container by mouth 2 (two) times daily between meals., Disp: , Rfl: ;  feeding supplement (PRO-STAT SUGAR FREE 64) LIQD, Take 30 mLs by mouth 2 (two) times daily., Disp: , Rfl:  ferrous sulfate 325 (65 FE) MG tablet, Take 325 mg by mouth 2 (two) times daily with a meal., Disp: , Rfl: ;  FLUoxetine (PROZAC) 40 MG capsule, Take 40 mg by mouth daily.  , Disp: , Rfl: ;  gabapentin (NEURONTIN) 300 MG capsule, Take 600 mg by mouth 3 (three) times daily., Disp: , Rfl: ;  Multiple Vitamins-Minerals (MULTIVITAMINS THER. W/MINERALS) TABS, Take 1 tablet by mouth daily.  , Disp: , Rfl:  naproxen sodium (ANAPROX) 275 MG tablet, Take 275 mg by mouth 2 (two) times daily with a meal. For inflammation, Disp: , Rfl: ;  omeprazole (PRILOSEC) 20 MG capsule, Take 20 mg by mouth 2 (two) times daily.  , Disp: , Rfl: ;  oxyCODONE (OXY IR/ROXICODONE) 5 MG immediate release tablet, Take 5 mg by mouth every 4 (four) hours as needed. pain, Disp: , Rfl:  sodium chloride 0.9 % SOLN 100 mL with vancomycin 500 MG SOLR 500 mg, Inject 500 mg into the vein every 12 (twelve) hours., Disp: 42 g, Rfl: 0;  vitamin C (ASCORBIC ACID) 500 MG tablet, Take 500 mg by mouth 2 (two) times daily.  , Disp: ,  Rfl:  Current facility-administered medications:heparin lock flush 100 unit/mL, 250 Units, Intracatheter, Prior to discharge, D Jeananne Rama, PA Facility-Administered Medications Ordered in Other Encounters: 0.9 %  sodium chloride infusion, , Intravenous, Continuous, Art A Hoss, MD  Results for orders placed during the hospital encounter of 03/25/12  CBC      Component Value Range   WBC 6.4  4.0 - 10.5 (K/uL)   RBC 4.88  3.87 - 5.11 (MIL/uL)   Hemoglobin 11.7 (*) 12.0 - 15.0 (g/dL)   HCT 09.8  11.9 - 14.7 (%)   MCV 75.4 (*) 78.0 - 100.0 (fL)   MCH 24.0 (*) 26.0 - 34.0 (pg)   MCHC 31.8  30.0 - 36.0 (g/dL)   RDW 82.9 (*) 56.2 - 15.5 (%)   Platelets 340  150  - 400 (K/uL)  DIFFERENTIAL      Component Value Range   Neutrophils Relative 44  43 - 77 (%)   Neutro Abs 2.8  1.7 - 7.7 (K/uL)   Lymphocytes Relative 40  12 - 46 (%)   Lymphs Abs 2.6  0.7 - 4.0 (K/uL)   Monocytes Relative 14 (*) 3 - 12 (%)   Monocytes Absolute 0.9  0.1 - 1.0 (K/uL)   Eosinophils Relative 2  0 - 5 (%)   Eosinophils Absolute 0.1  0.0 - 0.7 (K/uL)   Basophils Relative 0  0 - 1 (%)   Basophils Absolute 0.0  0.0 - 0.1 (K/uL)  BASIC METABOLIC PANEL      Component Value Range   Sodium 143  135 - 145 (mEq/L)   Potassium 4.5  3.5 - 5.1 (mEq/L)   Chloride 111  96 - 112 (mEq/L)   CO2 23  19 - 32 (mEq/L)   Glucose, Bld 89  70 - 99 (mg/dL)   BUN 23  6 - 23 (mg/dL)   Creatinine, Ser 1.30  0.50 - 1.10 (mg/dL)   Calcium 86.5 (*) 8.4 - 10.5 (mg/dL)   GFR calc non Af Amer 87 (*) >90 (mL/min)   GFR calc Af Amer >90  >90 (mL/min)  LACTIC ACID, PLASMA      Component Value Range   Lactic Acid, Venous 1.0  0.5 - 2.2 (mmol/L)  URINALYSIS, ROUTINE W REFLEX MICROSCOPIC      Component Value Range   Color, Urine AMBER (*) YELLOW    APPearance TURBID (*) CLEAR    Specific Gravity, Urine 1.020  1.005 - 1.030    pH 5.5  5.0 - 8.0    Glucose, UA NEGATIVE  NEGATIVE (mg/dL)   Hgb urine dipstick LARGE (*) NEGATIVE    Bilirubin Urine NEGATIVE  NEGATIVE    Ketones, ur NEGATIVE  NEGATIVE (mg/dL)   Protein, ur 30 (*) NEGATIVE (mg/dL)   Urobilinogen, UA 0.2  0.0 - 1.0 (mg/dL)   Nitrite NEGATIVE  NEGATIVE    Leukocytes, UA LARGE (*) NEGATIVE   PROCALCITONIN      Component Value Range   Procalcitonin 0.42    CULTURE, BLOOD (ROUTINE X 2)      Component Value Range   Specimen Description BLOOD LEFT HAND     Special Requests BOTTLES DRAWN AEROBIC AND ANAEROBIC 5CC EACH     Culture  Setup Time [id number]     Culture       Value: STAPHYLOCOCCUS SPECIES (COAGULASE NEGATIVE)     Note: SUSCEPTIBILITIES PERFORMED ON PREVIOUS CULTURE WITHIN THE LAST 5 DAYS.     Note: Gram Stain Report Called  to,Read Back By and  Verified With: [facility] DILLON @ 2258 ON 03/26/12 BY GOLLD   Report Status 03/29/2012 FINAL    CULTURE, BLOOD (ROUTINE X 2)      Component Value Range   Specimen Description BLOOD LEFT ARM     Special Requests BOTTLES DRAWN AEROBIC ONLY 2CC     Culture  Setup Time [id number]     Culture       Value: STAPHYLOCOCCUS SPECIES (COAGULASE NEGATIVE)     Note: RIFAMPIN AND GENTAMICIN SHOULD NOT BE USED AS SINGLE DRUGS FOR TREATMENT OF STAPH INFECTIONS.     Note: Gram Stain Report Called to,Read Back By and Verified With: ROBIN WOFFORD @ 1021 03/27/12 BY KRAWS   Report Status 03/29/2012 FINAL     Organism ID, Bacteria STAPHYLOCOCCUS SPECIES (COAGULASE NEGATIVE)    URINE MICROSCOPIC-ADD ON      Component Value Range   WBC, UA TOO NUMEROUS TO COUNT  <3 (WBC/hpf)   RBC / HPF TOO NUMEROUS TO COUNT  <3 (RBC/hpf)   Bacteria, UA FEW (*) RARE    Casts HYALINE CASTS (*) NEGATIVE    Urine-Other MUCOUS PRESENT    COMPREHENSIVE METABOLIC PANEL      Component Value Range   Sodium 142  135 - 145 (mEq/L)   Potassium 3.9  3.5 - 5.1 (mEq/L)   Chloride 114 (*) 96 - 112 (mEq/L)   CO2 20  19 - 32 (mEq/L)   Glucose, Bld 97  70 - 99 (mg/dL)   BUN 20  6 - 23 (mg/dL)   Creatinine, Ser 9.56  0.50 - 1.10 (mg/dL)   Calcium 21.3  8.4 - 10.5 (mg/dL)   Total Protein 5.2 (*) 6.0 - 8.3 (g/dL)   Albumin 2.0 (*) 3.5 - 5.2 (g/dL)   AST 47 (*) 0 - 37 (U/L)   ALT 26  0 - 35 (U/L)   Alkaline Phosphatase 352 (*) 39 - 117 (U/L)   Total Bilirubin 0.2 (*) 0.3 - 1.2 (mg/dL)   GFR calc non Af Amer >90  >90 (mL/min)   GFR calc Af Amer >90  >90 (mL/min)  CBC      Component Value Range   WBC 4.3  4.0 - 10.5 (K/uL)   RBC 4.28  3.87 - 5.11 (MIL/uL)   Hemoglobin 10.3 (*) 12.0 - 15.0 (g/dL)   HCT 08.6 (*) 57.8 - 46.0 (%)   MCV 77.1 (*) 78.0 - 100.0 (fL)   MCH 24.1 (*) 26.0 - 34.0 (pg)   MCHC 31.2  30.0 - 36.0 (g/dL)   RDW 46.9 (*) 62.9 - 15.5 (%)   Platelets 294  150 - 400 (K/uL)  PARATHYROID HORMONE, INTACT  (NO CA)      Component Value Range   PTH <2.5 (*) 14.0 - 72.0 (pg/mL)  PROTIME-INR      Component Value Range   Prothrombin Time 16.7 (*) 11.6 - 15.2 (seconds)   INR 1.33  0.00 - 1.49   VANCOMYCIN, TROUGH      Component Value Range   Vancomycin Tr 20.8 (*) 10.0 - 20.0 (ug/mL)  CBC      Component Value Range   WBC 3.7 (*) 4.0 - 10.5 (K/uL)   RBC 4.22  3.87 - 5.11 (MIL/uL)   Hemoglobin 9.9 (*) 12.0 - 15.0 (g/dL)   HCT 52.8 (*) 41.3 - 46.0 (%)   MCV 76.3 (*) 78.0 - 100.0 (fL)   MCH 23.5 (*) 26.0 - 34.0 (pg)   MCHC 30.7  30.0 - 36.0 (g/dL)   RDW 24.4 (*)  11.5 - 15.5 (%)   Platelets 308  150 - 400 (K/uL)  COMPREHENSIVE METABOLIC PANEL      Component Value Range   Sodium 145  135 - 145 (mEq/L)   Potassium 2.9 (*) 3.5 - 5.1 (mEq/L)   Chloride 113 (*) 96 - 112 (mEq/L)   CO2 18 (*) 19 - 32 (mEq/L)   Glucose, Bld 64 (*) 70 - 99 (mg/dL)   BUN 10  6 - 23 (mg/dL)   Creatinine, Ser 1.61 (*) 0.50 - 1.10 (mg/dL)   Calcium 09.6  8.4 - 10.5 (mg/dL)   Total Protein 5.3 (*) 6.0 - 8.3 (g/dL)   Albumin 2.1 (*) 3.5 - 5.2 (g/dL)   AST 42 (*) 0 - 37 (U/L)   ALT 25  0 - 35 (U/L)   Alkaline Phosphatase 321 (*) 39 - 117 (U/L)   Total Bilirubin 0.3  0.3 - 1.2 (mg/dL)   GFR calc non Af Amer >90  >90 (mL/min)   GFR calc Af Amer >90  >90 (mL/min)  CULTURE, ROUTINE-ABSCESS      Component Value Range   Specimen Description ABSCESS ABDOMEN     Special Requests FLUID ASPIRATED FROM ANTERIOR WALL     Gram Stain       Value: RARE WBC PRESENT,BOTH PMN AND MONONUCLEAR     NO SQUAMOUS EPITHELIAL CELLS SEEN     NO ORGANISMS SEEN   Culture NO GROWTH 3 DAYS     Report Status 03/31/2012 FINAL    BASIC METABOLIC PANEL      Component Value Range   Sodium 140  135 - 145 (mEq/L)   Potassium 3.1 (*) 3.5 - 5.1 (mEq/L)   Chloride 110  96 - 112 (mEq/L)   CO2 15 (*) 19 - 32 (mEq/L)   Glucose, Bld 65 (*) 70 - 99 (mg/dL)   BUN 5 (*) 6 - 23 (mg/dL)   Creatinine, Ser 0.45 (*) 0.50 - 1.10 (mg/dL)   Calcium 9.8  8.4 -  10.5 (mg/dL)   GFR calc non Af Amer >90  >90 (mL/min)   GFR calc Af Amer >90  >90 (mL/min)  CBC      Component Value Range   WBC 3.1 (*) 4.0 - 10.5 (K/uL)   RBC 4.33  3.87 - 5.11 (MIL/uL)   Hemoglobin 10.4 (*) 12.0 - 15.0 (g/dL)   HCT 40.9 (*) 81.1 - 46.0 (%)   MCV 76.2 (*) 78.0 - 100.0 (fL)   MCH 24.0 (*) 26.0 - 34.0 (pg)   MCHC 31.5  30.0 - 36.0 (g/dL)   RDW 91.4 (*) 78.2 - 15.5 (%)   Platelets 284  150 - 400 (K/uL)  CULTURE, BLOOD (ROUTINE X 2)      Component Value Range   Specimen Description BLOOD LEFT HAND     Special Requests BOTTLES DRAWN AEROBIC AND ANAEROBIC 10CC     Culture  Setup Time [id number]     Culture NO GROWTH 5 DAYS     Report Status 04/04/2012 FINAL    CULTURE, BLOOD (ROUTINE X 2)      Component Value Range   Specimen Description BLOOD LEFT THUMB     Special Requests BOTTLES DRAWN AEROBIC ONLY 8CC     Culture  Setup Time [id number]     Culture NO GROWTH 5 DAYS     Report Status 04/04/2012 FINAL    BASIC METABOLIC PANEL      Component Value Range   Sodium 136  135 - 145 (mEq/L)  Potassium 2.2 (*) 3.5 - 5.1 (mEq/L)   Chloride 103  96 - 112 (mEq/L)   CO2 22  19 - 32 (mEq/L)   Glucose, Bld 75  70 - 99 (mg/dL)   BUN 3 (*) 6 - 23 (mg/dL)   Creatinine, Ser 1.61 (*) 0.50 - 1.10 (mg/dL)   Calcium 9.2  8.4 - 09.6 (mg/dL)   GFR calc non Af Amer >90  >90 (mL/min)   GFR calc Af Amer >90  >90 (mL/min)  BASIC METABOLIC PANEL      Component Value Range   Sodium 133 (*) 135 - 145 (mEq/L)   Potassium 3.7  3.5 - 5.1 (mEq/L)   Chloride 103  96 - 112 (mEq/L)   CO2 21  19 - 32 (mEq/L)   Glucose, Bld 83  70 - 99 (mg/dL)   BUN 3 (*) 6 - 23 (mg/dL)   Creatinine, Ser 0.45 (*) 0.50 - 1.10 (mg/dL)   Calcium 9.6  8.4 - 40.9 (mg/dL)   GFR calc non Af Amer >90  >90 (mL/min)   GFR calc Af Amer >90  >90 (mL/min)  MAGNESIUM      Component Value Range   Magnesium 2.1  1.5 - 2.5 (mg/dL)  VANCOMYCIN, TROUGH      Component Value Range   Vancomycin Tr 16.2  10.0 - 20.0  (ug/mL)  GLUCOSE, CAPILLARY      Component Value Range   Glucose-Capillary 81  70 - 99 (mg/dL)   Comment 1 Notify RN    CLOSTRIDIUM DIFFICILE BY PCR      Component Value Range   C difficile by pcr NEGATIVE  NEGATIVE   BASIC METABOLIC PANEL      Component Value Range   Sodium 138  135 - 145 (mEq/L)   Potassium 2.9 (*) 3.5 - 5.1 (mEq/L)   Chloride 107  96 - 112 (mEq/L)   CO2 21  19 - 32 (mEq/L)   Glucose, Bld 88  70 - 99 (mg/dL)   BUN 4 (*) 6 - 23 (mg/dL)   Creatinine, Ser 8.11 (*) 0.50 - 1.10 (mg/dL)   Calcium 9.3  8.4 - 91.4 (mg/dL)   GFR calc non Af Amer >90  >90 (mL/min)   GFR calc Af Amer >90  >90 (mL/min)  MAGNESIUM      Component Value Range   Magnesium 1.7  1.5 - 2.5 (mg/dL)    Review of Systems  Constitutional: Negative for fever, chills and malaise/fatigue.  Respiratory: Negative for cough and shortness of breath.   Cardiovascular: Negative for chest pain.  Gastrointestinal: Positive for nausea. Negative for vomiting and abdominal pain.  Musculoskeletal: Positive for back pain.  Neurological: Positive for focal weakness and headaches.       Left sided weakness secondary to MS  Endo/Heme/Allergies: Does not bruise/bleed easily.   VITALS:   BP  146/89  HR  106  R 18  TEMP 98.6  O2 SATS 96% RA Physical Exam  Constitutional: She is oriented to person, place, and time. She appears well-developed and well-nourished.  Cardiovascular: Regular rhythm.        tachycardic   Respiratory: Effort normal and breath sounds normal.  GI: Soft. Bowel sounds are normal. There is no tenderness.  Musculoskeletal: She exhibits no edema.  Neurological: She is alert and oriented to person, place, and time.     Assessment/Plan Patient with history of bladder rupture and pelvic fluid collections; plan is for CT cystogram with aspiration/drainage of pelvic fluid collections. Details/risks of  above d/w pt/family with their understanding and consent.  Charels Stambaugh,D KEVIN 04/28/2012, 12:46  PM

## 2012-04-28 NOTE — Procedures (Signed)
Pelvic abscess drain No comp

## 2012-04-28 NOTE — Progress Notes (Signed)
Right arm PICC single lumen. Unable to draw blood from site. Flushed easily with saling x2 and attmepted again to draw labs without success. Again dlushed with saline then heparin as per protocol new cap applied and clamped. Jeananne Rama PA for radiology made aware that PICC will flush but will not draw labs

## 2012-04-28 NOTE — ED Notes (Signed)
Incision made

## 2012-04-28 NOTE — ED Notes (Signed)
Repositioned.

## 2012-04-28 NOTE — ED Notes (Signed)
1st drain in

## 2012-05-06 ENCOUNTER — Inpatient Hospital Stay: Payer: Medicare (Managed Care) | Admitting: Infectious Disease

## 2012-05-07 ENCOUNTER — Telehealth: Payer: Self-pay | Admitting: Licensed Clinical Social Worker

## 2012-05-07 ENCOUNTER — Encounter: Payer: Self-pay | Admitting: Licensed Clinical Social Worker

## 2012-05-07 NOTE — Telephone Encounter (Signed)
I called the patient and the phone was disconnected. The patient is an ID patient with IV antibiotics. I will try sending a letter.

## 2012-06-23 DEATH — deceased

## 2012-09-16 ENCOUNTER — Encounter: Payer: Self-pay | Admitting: Internal Medicine

## 2013-05-10 IMAGING — CR DG CHEST 1V PORT
1 series · 2 of 2 positions shown · non-contrast
Comparison: 01/12/2012

CLINICAL DATA: Sepsis and unresponsive.

PORTABLE CHEST - 1 VIEW

[Series 1: AP · U · 2 of 2 slices shown]
[im 1/2]
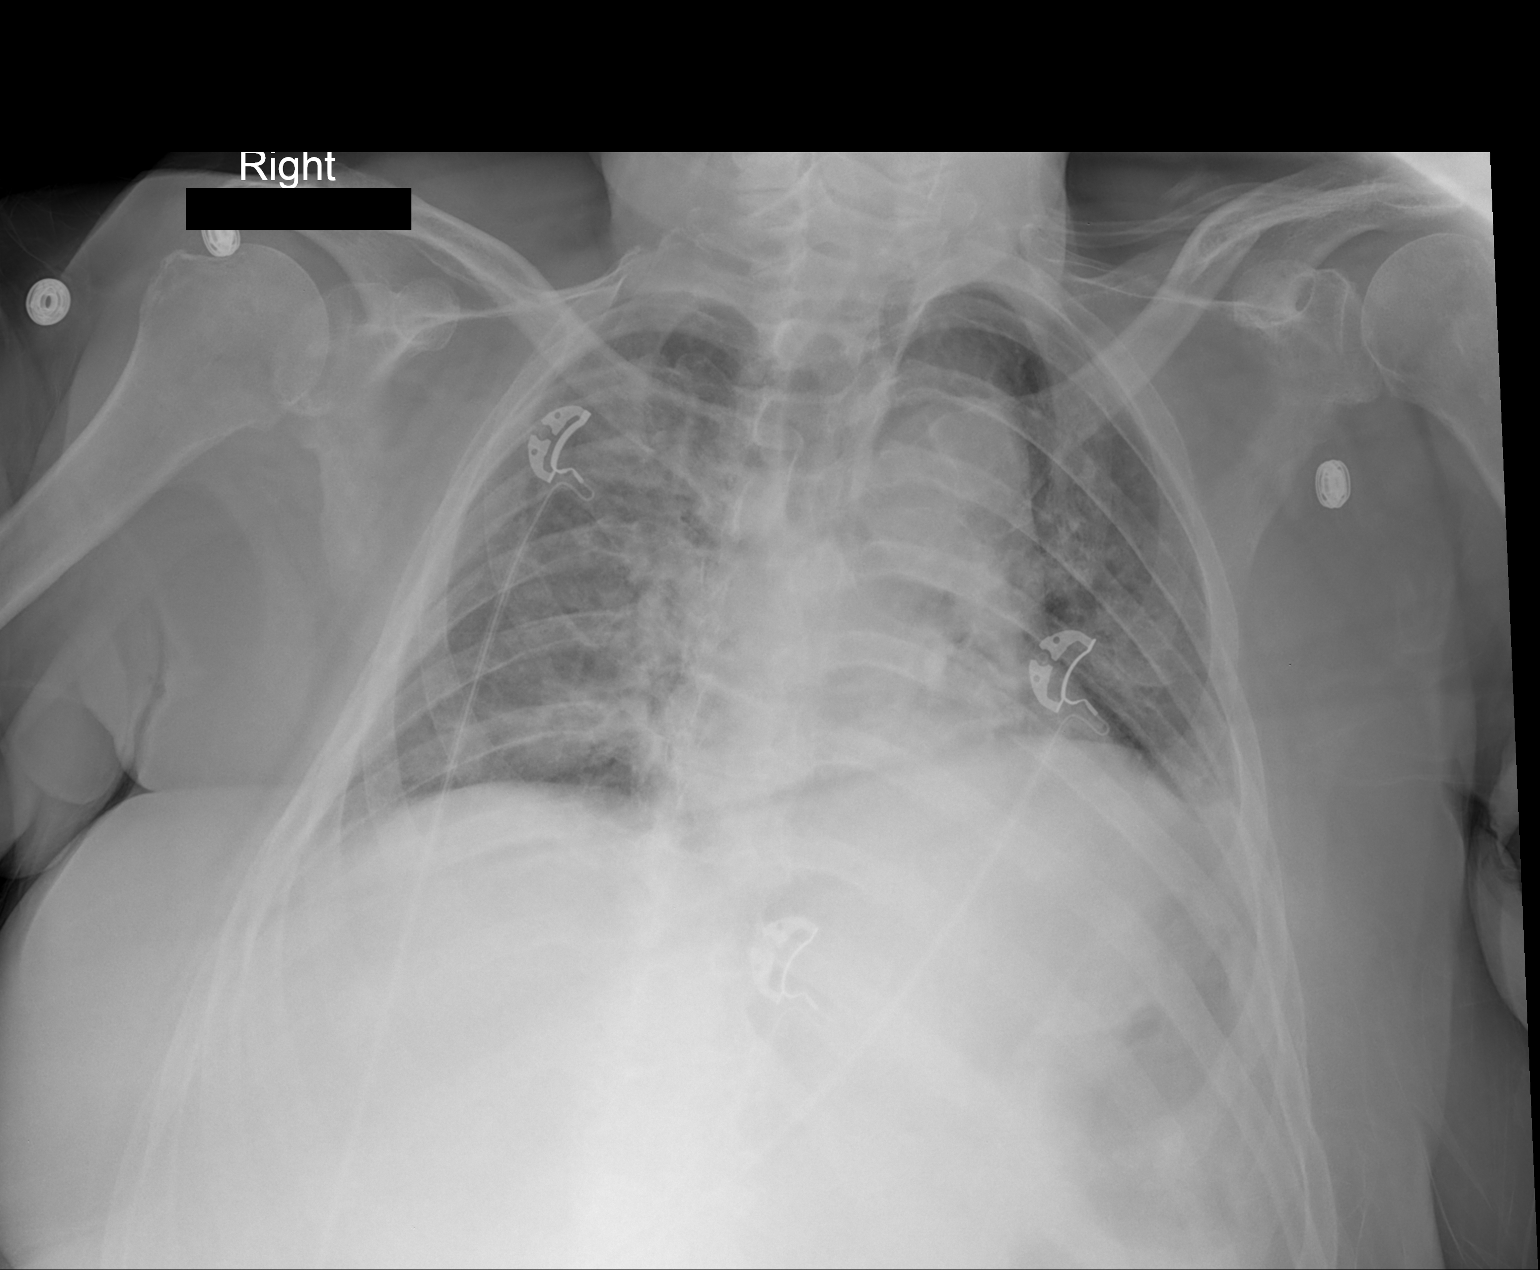
[im 2/2]
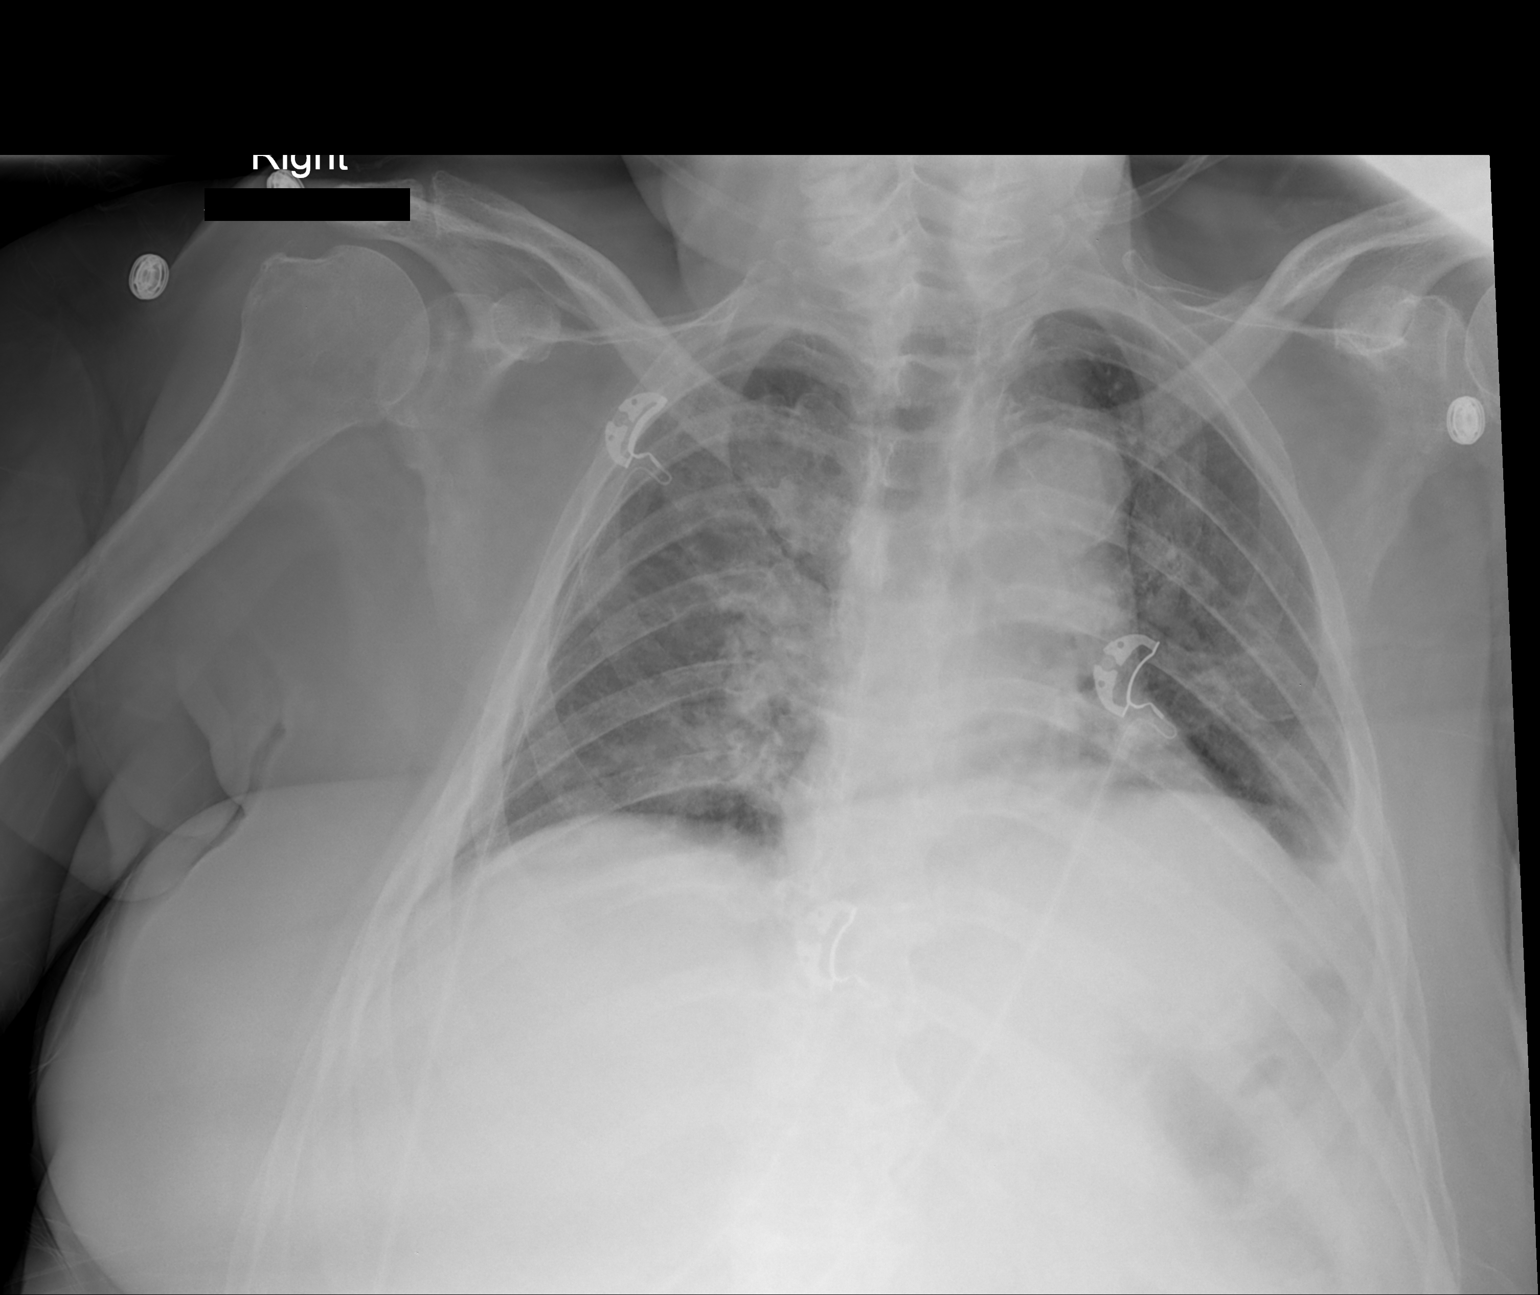

[2 of 2 positions shown; findings below may reference images not displayed]

FINDINGS: Lung volumes are very low with significant atelectasis
present.  It would be difficult to exclude subtle infiltrate given
the compressed lungs.  There is no overt pulmonary edema.  There
may be a tiny amount of left pleural fluid.  The heart size is
within normal limits.
IMPRESSION: Extremely low lung volumes with bilateral atelectasis.  Potential
tiny left pleural effusion.

## 2013-05-14 IMAGING — CR DG CHEST 1V PORT
1 series · 1 of 1 positions shown · non-contrast
Comparison: 02/06/2012 at 3007 hours

CLINICAL DATA: Increasing shortness of breath.  Decreasing oxygen
saturation.

PORTABLE CHEST - 1 VIEW

[AP]
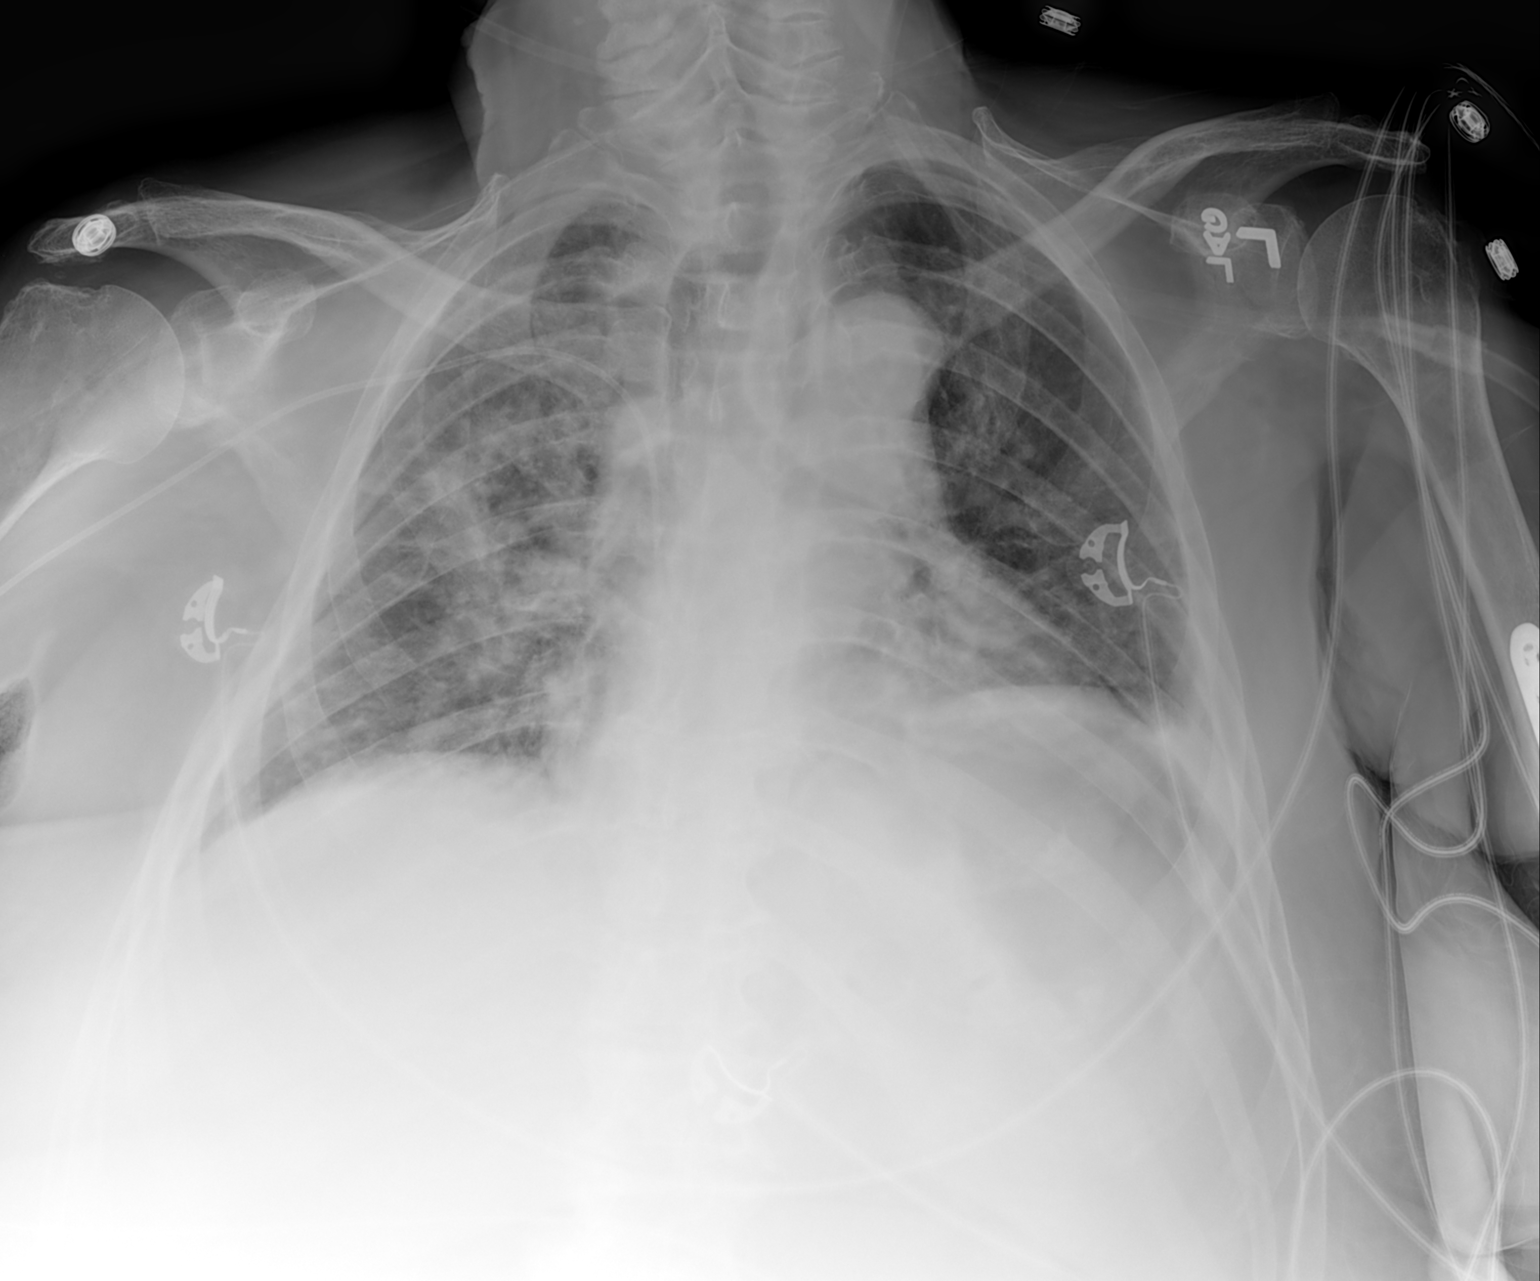

[1 of 1 positions shown; findings below may reference images not displayed]

FINDINGS: Shallow inspiration.  Borderline heart size and pulmonary
vascularity.  Bilateral perihilar air space interstitial disease,
greater on the right.  Changes could represent pneumonia or edema.
Suggestion of blunting of the left costophrenic angle.  No
pneumothorax.  Findings appear stable since the previous study.
Interval placement of a right PICC catheter with tip over the low
SVC region.
IMPRESSION: Shallow inspiration with borderline heart size and pulmonary
vascularity.  Perihilar air space interstitial disease could
represent pneumonia or edema.  PICC line tip is over the low SVC
region.

## 2013-05-14 IMAGING — CR DG CHEST 1V PORT
1 series · 1 of 1 positions shown · non-contrast
Comparison: 02/05/2012.

CLINICAL DATA: Atelectasis.

PORTABLE CHEST - 1 VIEW

[AP]
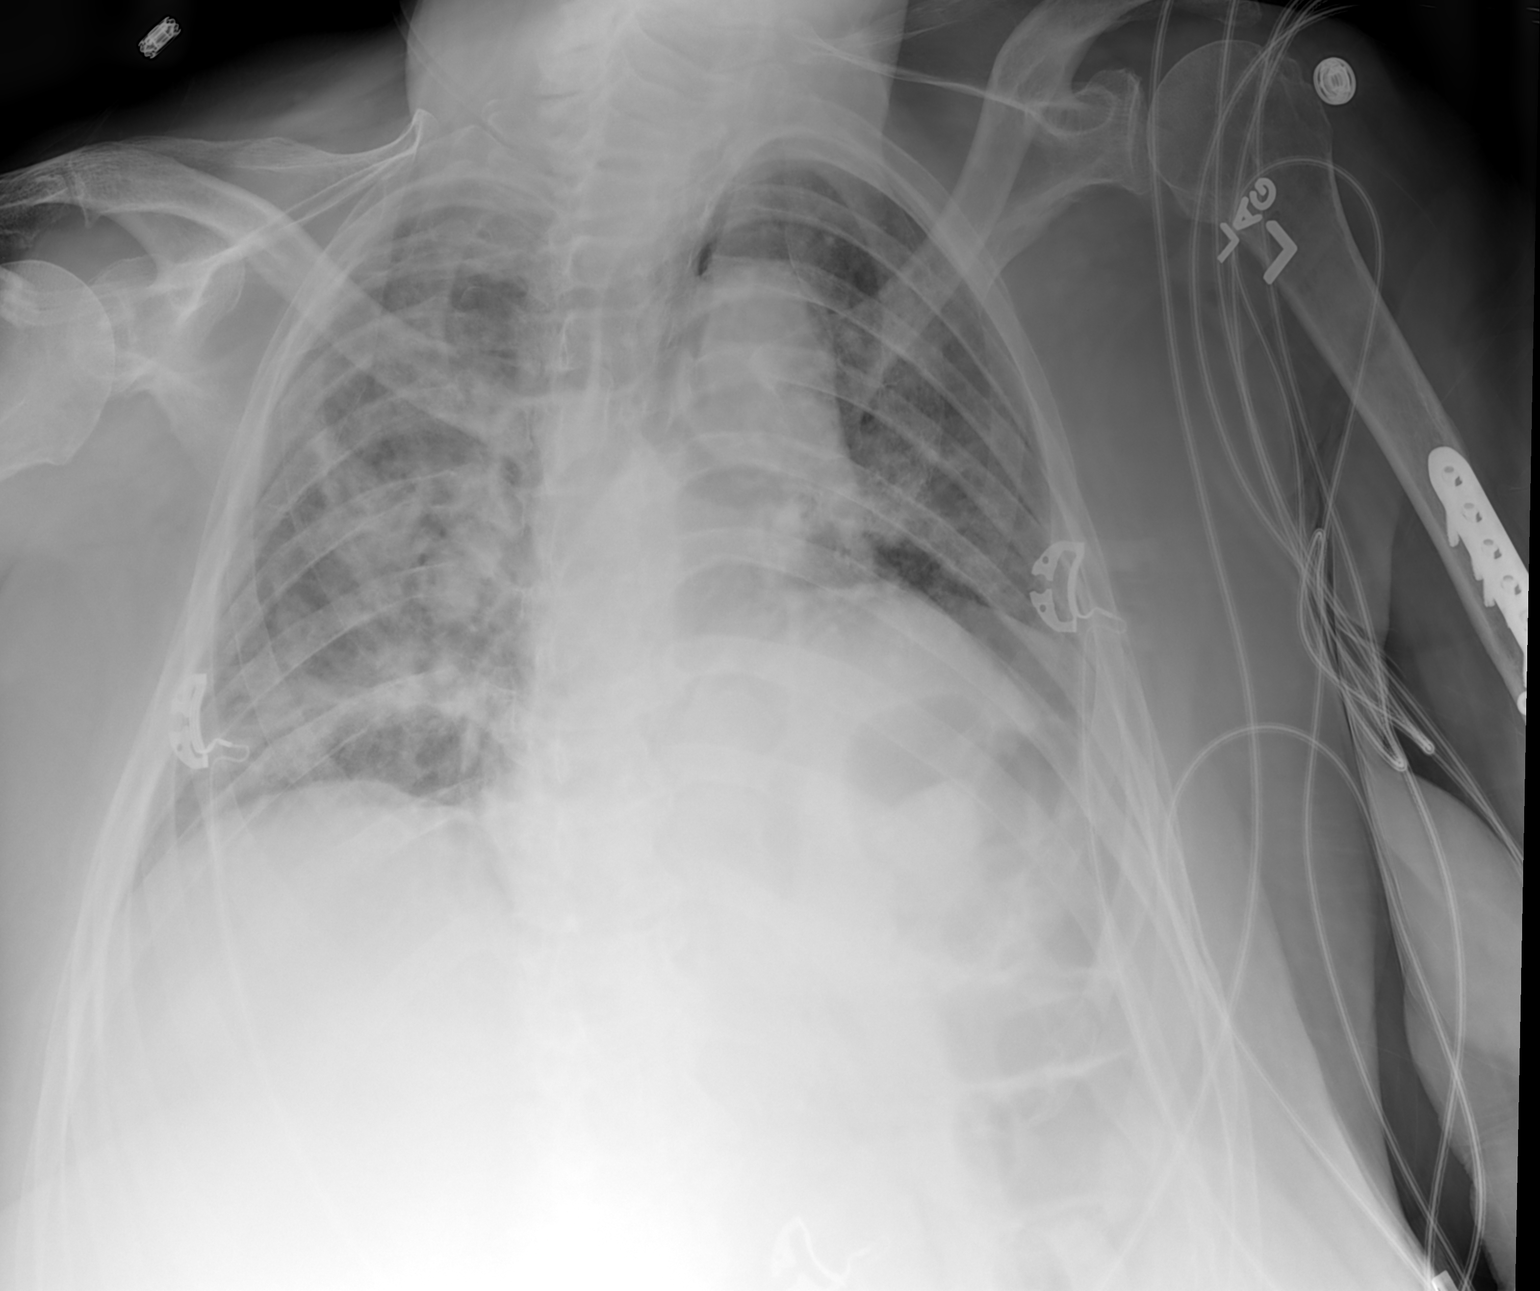

[1 of 1 positions shown; findings below may reference images not displayed]

FINDINGS: Trachea is midline, given patient rotation.  Heart size
is grossly stable.  There is diffuse bilateral air space disease,
somewhat asymmetric on the right.  Left hemidiaphragm is elevated.
IMPRESSION: Diffuse bilateral air space disease, somewhat asymmetric on the
right, possibly due to edema.  Pneumonia is not excluded.

## 2013-05-16 IMAGING — CR DG CHEST 1V
2 series · 2 of 2 positions shown · non-contrast
Comparison: 02/07/2012

CLINICAL DATA: Recheck for pneumonia.  No chest pain or cough.

CHEST - 1 VIEW

[view not recorded (1 of 2)]
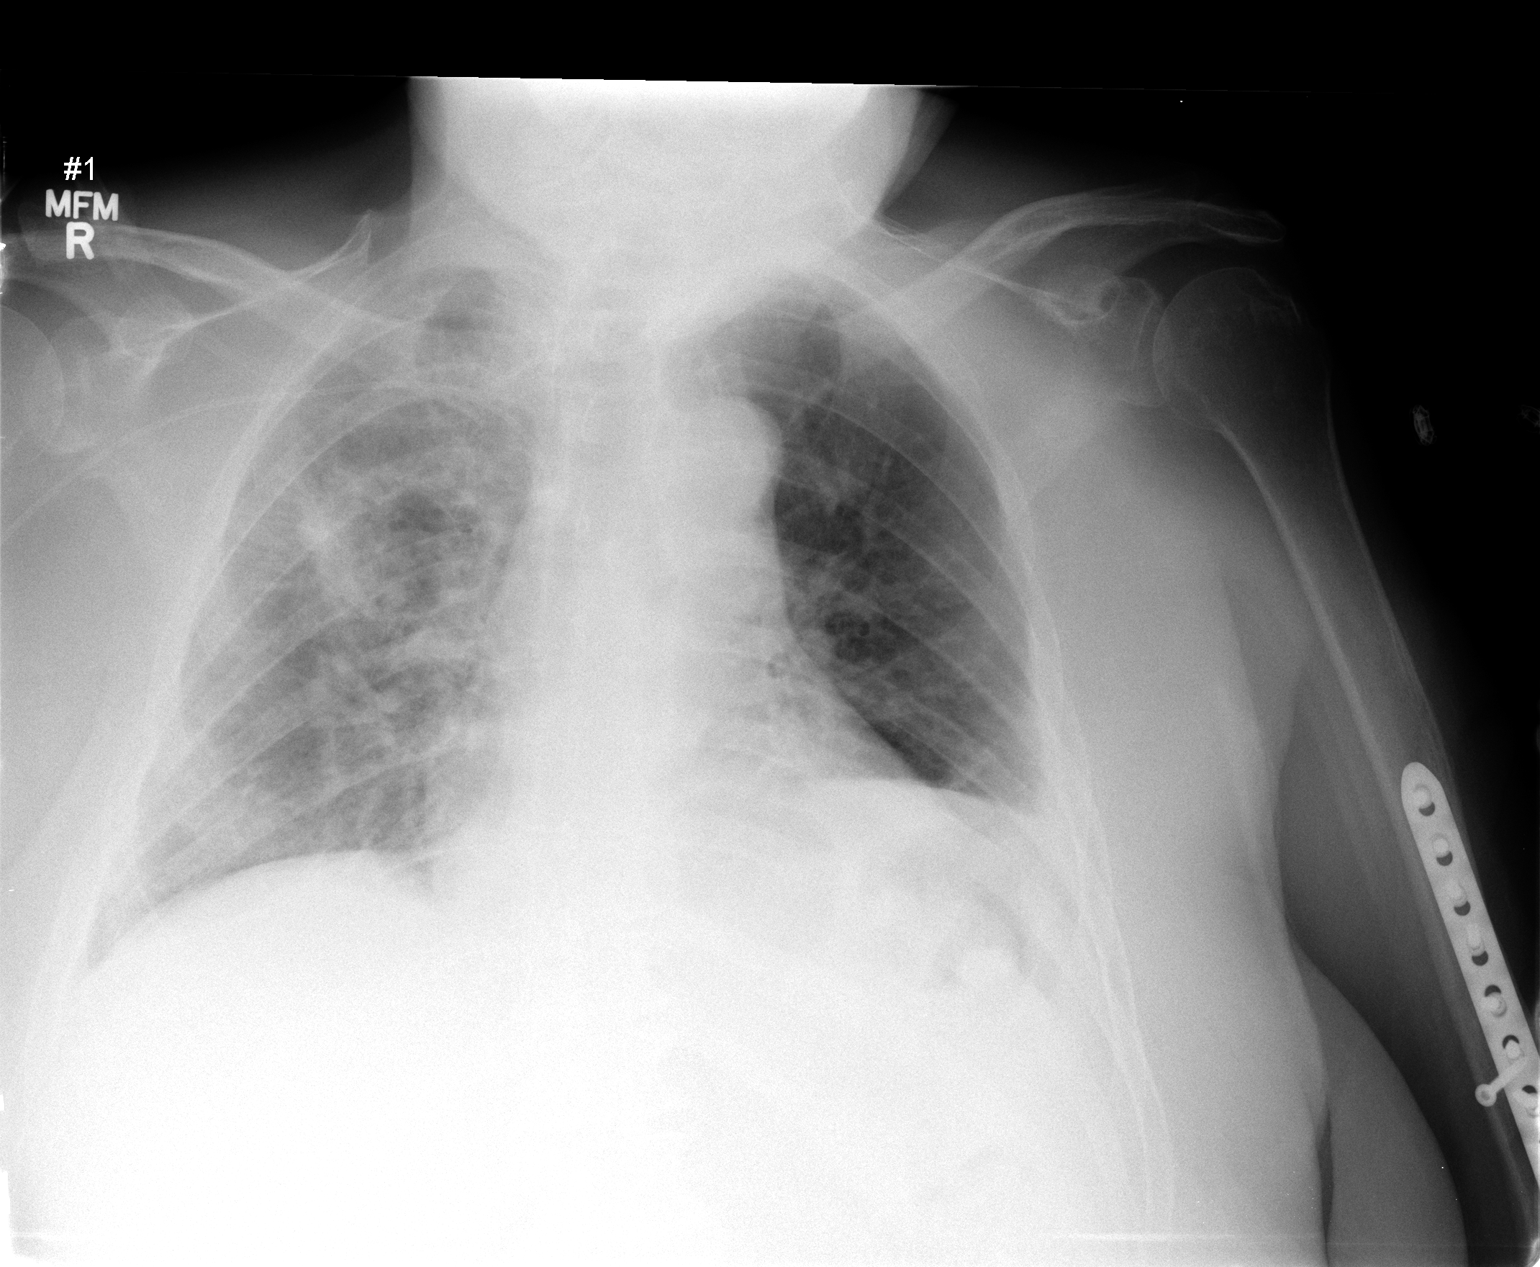

[view not recorded (2 of 2)]
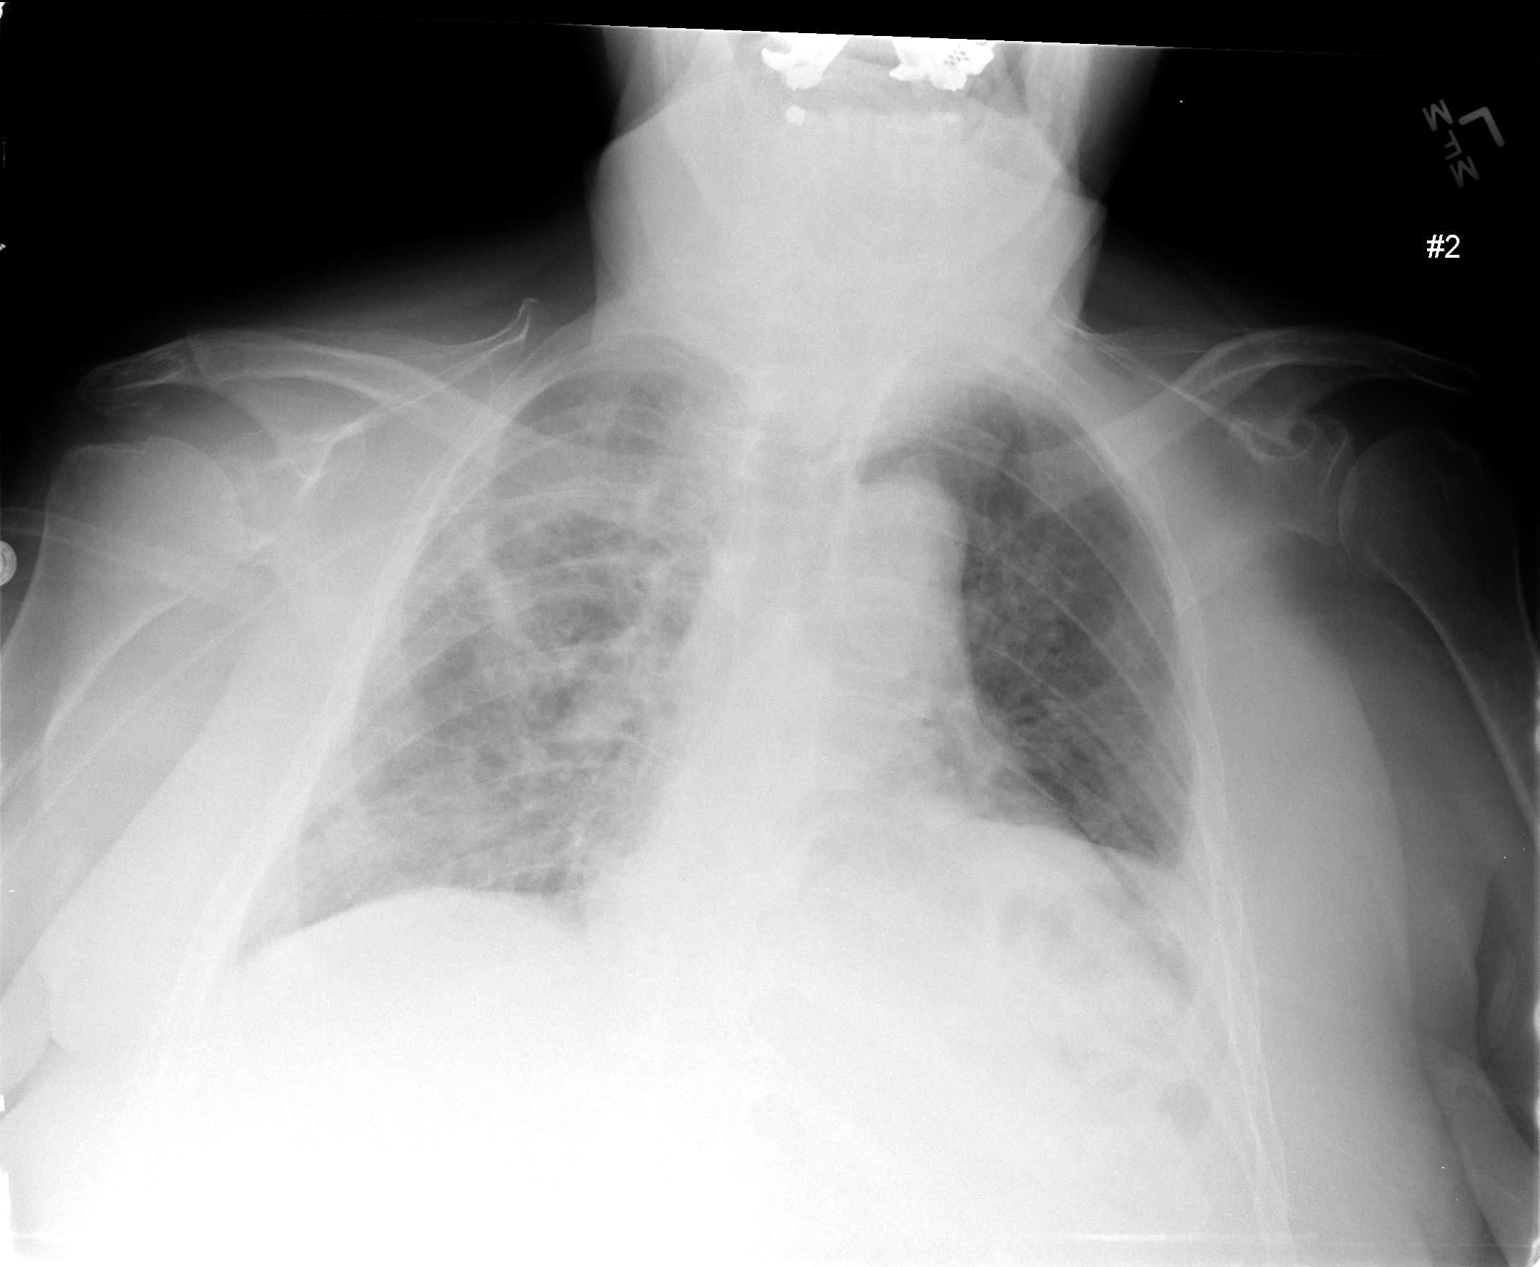

[2 of 2 positions shown; findings below may reference images not displayed]

FINDINGS: The patient has right-sided PICC line, tip to the level
of the superior vena cava.  There continues to be patchy density
within the right lung and medial left lung base with little change
from prior study.  Prior left humerus ORIF.
IMPRESSION: Little interval change.

## 2013-05-26 IMAGING — CR DG ABDOMEN 1V
1 series · 2 of 2 positions shown · non-contrast
Comparison: 02/13/2012

CLINICAL DATA: Constipation

ABDOMEN - 1 VIEW

[Series 1: ap (kub) · U · 2 of 2 slices shown]
[im 1/2]
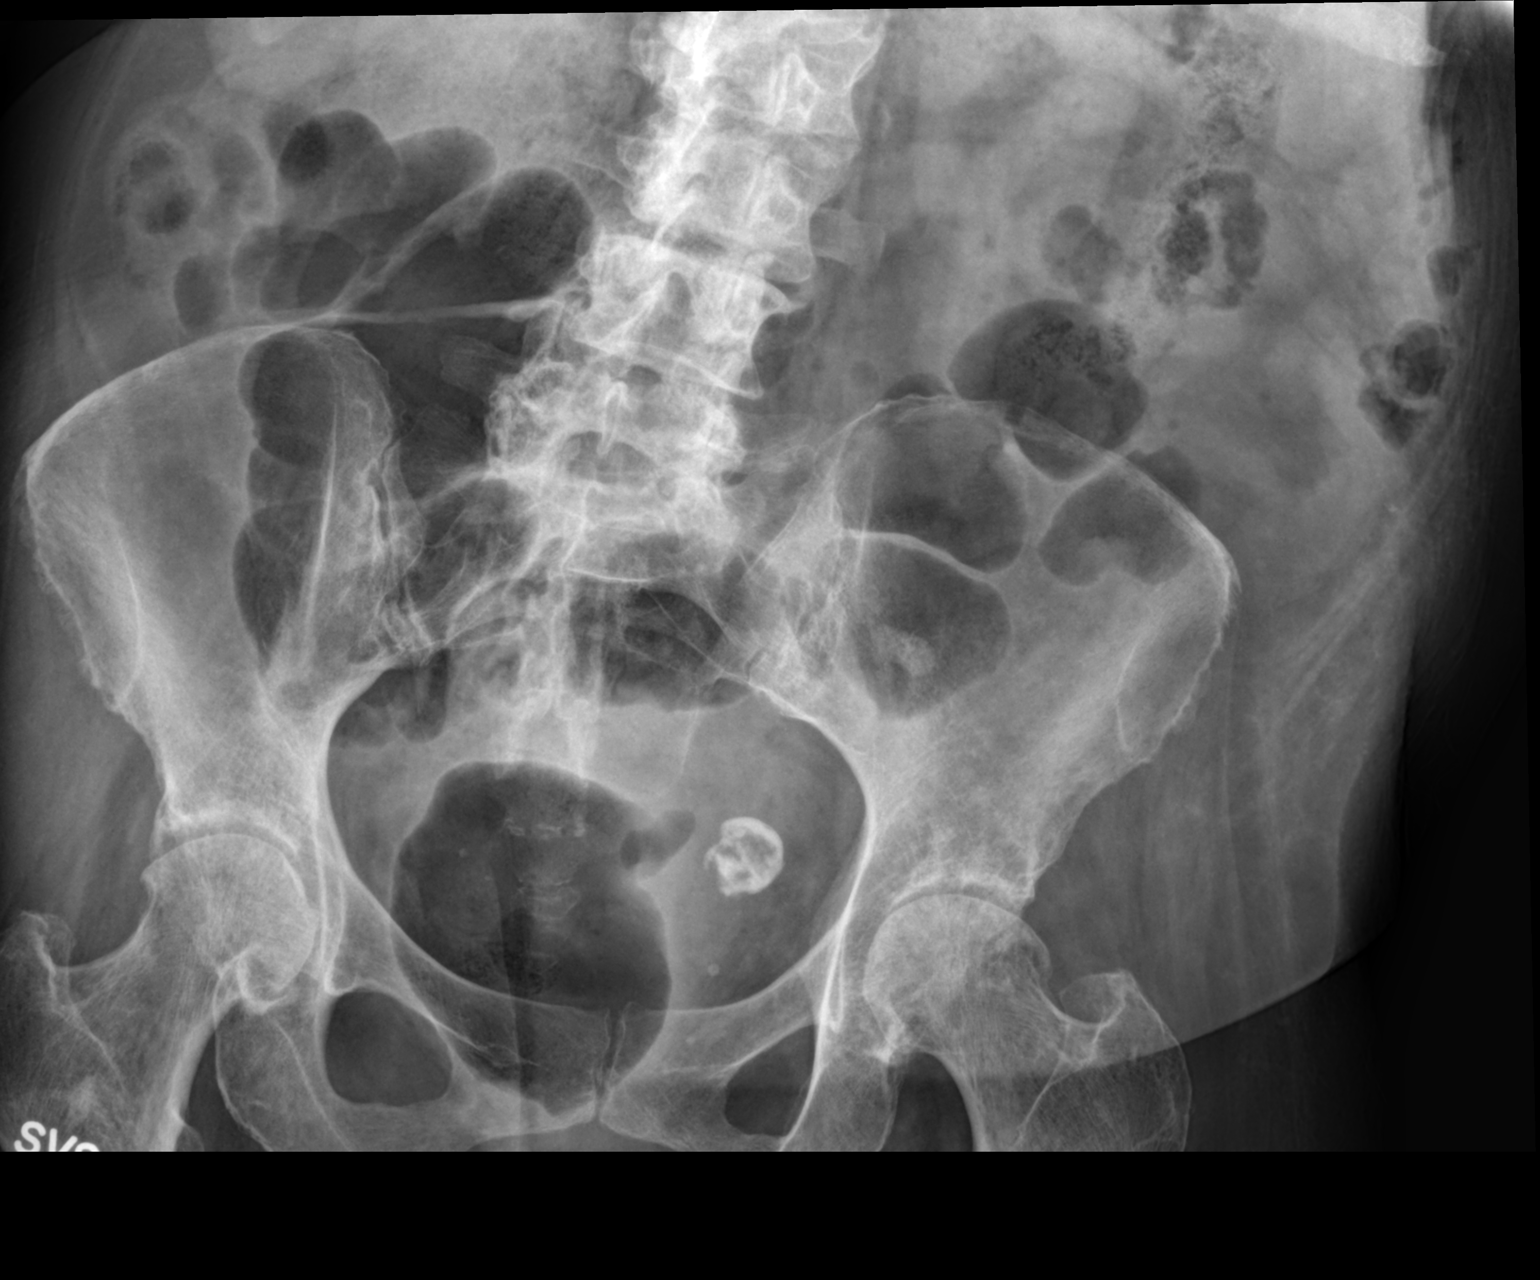
[im 2/2]
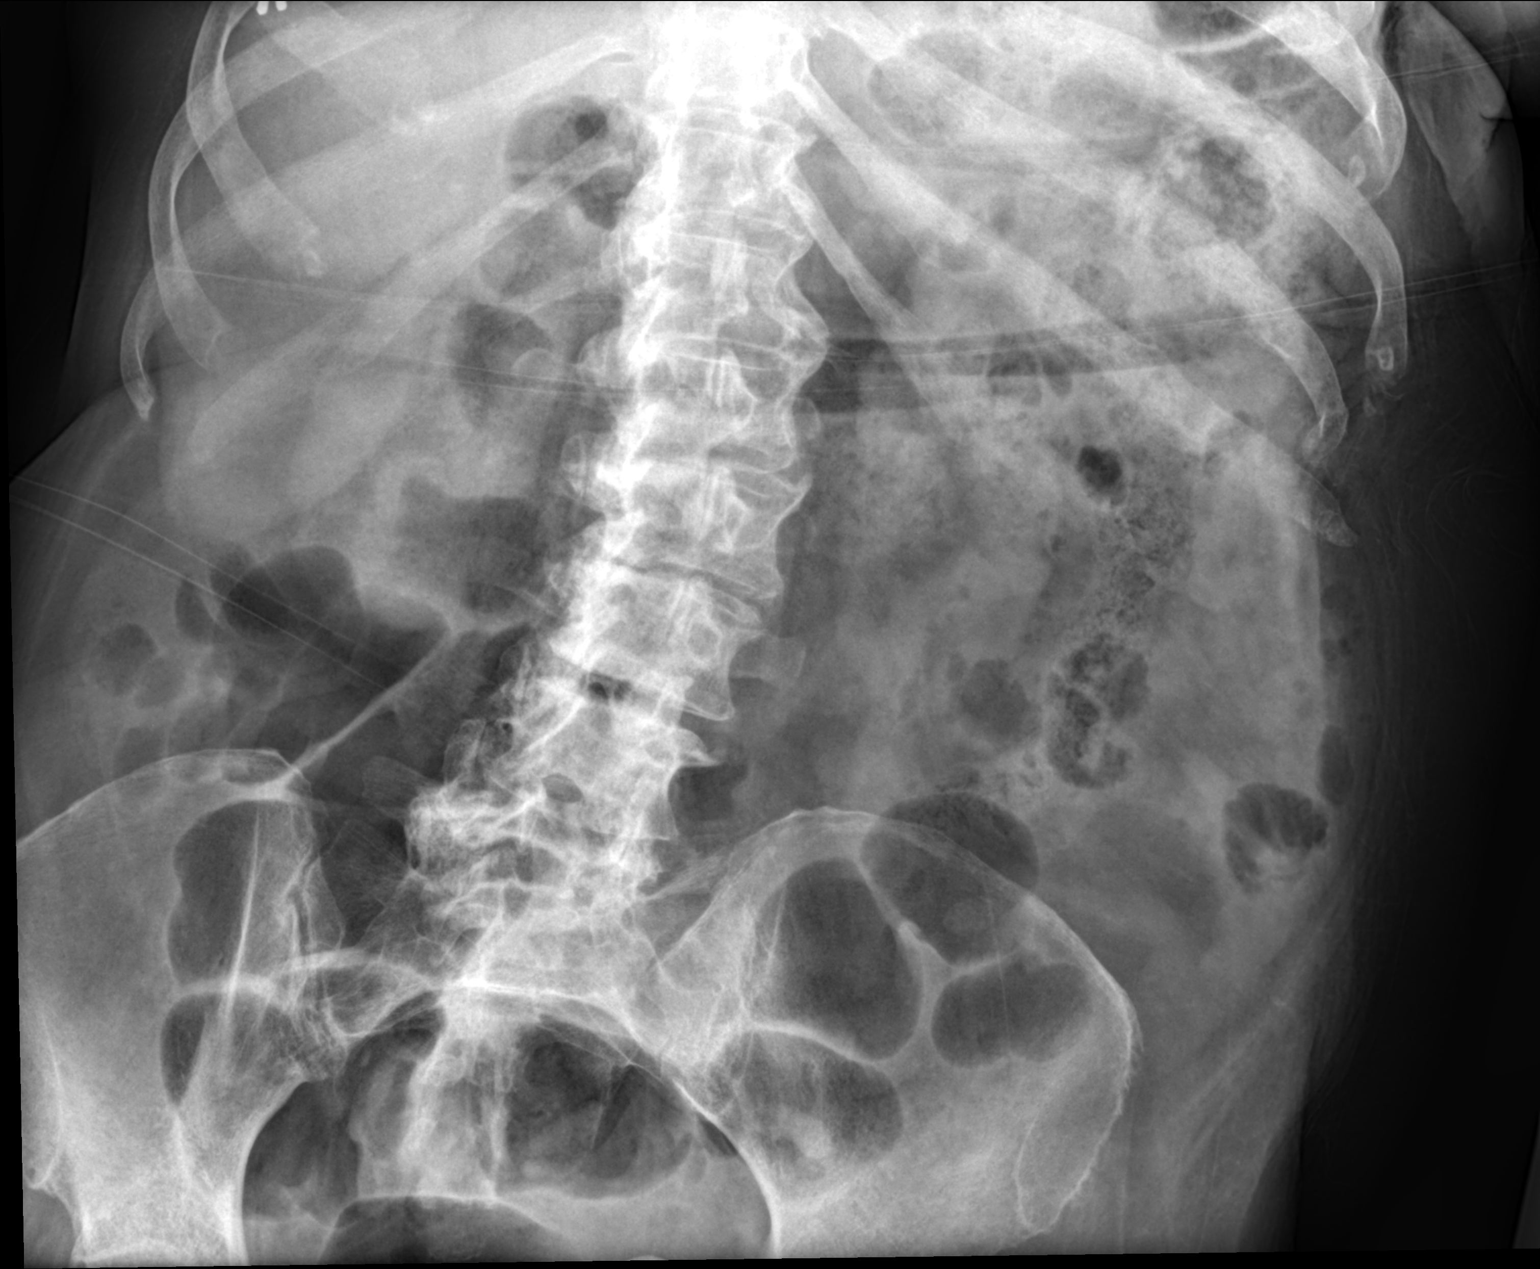

[2 of 2 positions shown; findings below may reference images not displayed]

FINDINGS: Moderate colonic stool burden without definite evidence of
obstruction. Evaluation pneumoperitoneum is limited secondary to
supine patient positioning and exclusion of the lower thorax.  No
definite pneumatosis.  Calcification overlying the left hemipelvis
likely represents a calcified fibroid.  Left femoral intramedullary
rod, incompletely evaluated. Possible bone island within the
intertrochanteric right femur. Lumbar spine degenerative change.
IMPRESSION: Moderate colonic stool burden without evidence of obstruction.

## 2014-03-09 ENCOUNTER — Telehealth: Payer: Self-pay | Admitting: *Deleted

## 2015-02-11 NOTE — Telephone Encounter (Signed)
none
